# Patient Record
Sex: Male | Born: 1935
Health system: Southern US, Community
[De-identification: ages and names within clinical notes are randomized; demographics above are authoritative.]

## PROBLEM LIST (undated history)

## (undated) DIAGNOSIS — R972 Elevated prostate specific antigen [PSA]: Secondary | ICD-10-CM

## (undated) DIAGNOSIS — S2231XA Fracture of one rib, right side, initial encounter for closed fracture: Secondary | ICD-10-CM

## (undated) DIAGNOSIS — I471 Supraventricular tachycardia, unspecified: Secondary | ICD-10-CM

## (undated) DIAGNOSIS — E119 Type 2 diabetes mellitus without complications: Secondary | ICD-10-CM

## (undated) DIAGNOSIS — K635 Polyp of colon: Secondary | ICD-10-CM

## (undated) DIAGNOSIS — M1990 Unspecified osteoarthritis, unspecified site: Secondary | ICD-10-CM

## (undated) DIAGNOSIS — R6889 Other general symptoms and signs: Secondary | ICD-10-CM

## (undated) DIAGNOSIS — I10 Essential (primary) hypertension: Secondary | ICD-10-CM

## (undated) DIAGNOSIS — D649 Anemia, unspecified: Secondary | ICD-10-CM

## (undated) DIAGNOSIS — R002 Palpitations: Secondary | ICD-10-CM

## (undated) DIAGNOSIS — R4702 Dysphasia: Secondary | ICD-10-CM

## (undated) DIAGNOSIS — E039 Hypothyroidism, unspecified: Secondary | ICD-10-CM

## (undated) DIAGNOSIS — H9190 Unspecified hearing loss, unspecified ear: Secondary | ICD-10-CM

## (undated) DIAGNOSIS — R55 Syncope and collapse: Secondary | ICD-10-CM

## (undated) DIAGNOSIS — C159 Malignant neoplasm of esophagus, unspecified: Secondary | ICD-10-CM

## (undated) DIAGNOSIS — R001 Bradycardia, unspecified: Secondary | ICD-10-CM

## (undated) DIAGNOSIS — Z972 Presence of dental prosthetic device (complete) (partial): Secondary | ICD-10-CM

## (undated) DIAGNOSIS — E785 Hyperlipidemia, unspecified: Secondary | ICD-10-CM

## (undated) HISTORY — DX: Hypothyroidism, unspecified: E03.9

## (undated) HISTORY — DX: Hyperlipidemia, unspecified: E78.5

## (undated) HISTORY — DX: Malignant neoplasm of esophagus, unspecified: C15.9

## (undated) HISTORY — DX: Palpitations: R00.2

## (undated) HISTORY — DX: Supraventricular tachycardia, unspecified: I47.10

## (undated) HISTORY — DX: Supraventricular tachycardia: I47.1

## (undated) HISTORY — DX: Polyp of colon: K63.5

## (undated) HISTORY — DX: Dysphasia: R47.02

## (undated) HISTORY — PX: BACK SURGERY: SHX140

## (undated) HISTORY — PX: EYE SURGERY: SHX253

## (undated) HISTORY — DX: Syncope and collapse: R55

## (undated) HISTORY — DX: Essential (primary) hypertension: I10

## (undated) HISTORY — DX: Fracture of one rib, right side, initial encounter for closed fracture: S22.31XA

## (undated) HISTORY — DX: Type 2 diabetes mellitus without complications: E11.9

## (undated) HISTORY — DX: Unspecified osteoarthritis, unspecified site: M19.90

## (undated) HISTORY — DX: Bradycardia, unspecified: R00.1

## (undated) HISTORY — DX: Other general symptoms and signs: R68.89

## (undated) HISTORY — PX: CYST EXCISION: SHX5701

## (undated) HISTORY — DX: Elevated prostate specific antigen (PSA): R97.20

---

## 1965-09-24 HISTORY — PX: CHOLECYSTECTOMY: SHX55

## 1973-09-24 HISTORY — PX: VASECTOMY: SHX75

## 1973-09-24 HISTORY — PX: VARICOSE VEIN SURGERY: SHX832

## 1979-05-26 DIAGNOSIS — E039 Hypothyroidism, unspecified: Secondary | ICD-10-CM

## 1979-05-26 HISTORY — DX: Hypothyroidism, unspecified: E03.9

## 1984-09-24 DIAGNOSIS — E119 Type 2 diabetes mellitus without complications: Secondary | ICD-10-CM

## 1984-09-24 HISTORY — DX: Type 2 diabetes mellitus without complications: E11.9

## 1997-02-09 ENCOUNTER — Encounter: Payer: Self-pay | Admitting: Family Medicine

## 1997-02-09 LAB — CONVERTED CEMR LAB: PSA: 1.5 ng/mL

## 1998-02-07 ENCOUNTER — Encounter: Payer: Self-pay | Admitting: Family Medicine

## 1998-08-22 ENCOUNTER — Encounter: Payer: Self-pay | Admitting: Family Medicine

## 1999-02-25 ENCOUNTER — Encounter: Payer: Self-pay | Admitting: Family Medicine

## 1999-02-25 LAB — CONVERTED CEMR LAB: Microalbumin U total vol: 24.3 mg/L

## 1999-02-28 ENCOUNTER — Encounter: Payer: Self-pay | Admitting: Family Medicine

## 1999-02-28 LAB — CONVERTED CEMR LAB: Hgb A1c MFr Bld: 7.3 %

## 2000-04-24 ENCOUNTER — Encounter: Payer: Self-pay | Admitting: Family Medicine

## 2000-04-24 LAB — CONVERTED CEMR LAB: Microalbumin U total vol: 12.9 mg/L

## 2000-05-09 ENCOUNTER — Encounter: Payer: Self-pay | Admitting: Family Medicine

## 2000-05-09 LAB — CONVERTED CEMR LAB: Hgb A1c MFr Bld: 6.7 %

## 2000-10-25 DIAGNOSIS — I10 Essential (primary) hypertension: Secondary | ICD-10-CM

## 2000-10-25 HISTORY — DX: Essential (primary) hypertension: I10

## 2000-10-25 HISTORY — PX: OTHER SURGICAL HISTORY: SHX169

## 2001-05-14 ENCOUNTER — Encounter: Payer: Self-pay | Admitting: Family Medicine

## 2001-05-14 LAB — CONVERTED CEMR LAB: PSA: 1.2 ng/mL

## 2001-10-29 ENCOUNTER — Encounter: Payer: Self-pay | Admitting: Family Medicine

## 2002-04-08 ENCOUNTER — Encounter: Payer: Self-pay | Admitting: Family Medicine

## 2002-07-20 ENCOUNTER — Encounter: Payer: Self-pay | Admitting: Family Medicine

## 2002-07-20 LAB — CONVERTED CEMR LAB
Hgb A1c MFr Bld: 6.7 %
Microalbumin U total vol: 42 mg/L

## 2003-07-08 ENCOUNTER — Encounter: Payer: Self-pay | Admitting: Family Medicine

## 2003-07-08 LAB — CONVERTED CEMR LAB: PSA: 1.7 ng/mL

## 2003-10-13 ENCOUNTER — Encounter: Payer: Self-pay | Admitting: Family Medicine

## 2004-01-12 ENCOUNTER — Encounter: Payer: Self-pay | Admitting: Family Medicine

## 2004-01-12 LAB — CONVERTED CEMR LAB: Hgb A1c MFr Bld: 6.6 %

## 2004-06-13 ENCOUNTER — Encounter: Admission: RE | Admit: 2004-06-13 | Discharge: 2004-06-13 | Payer: Self-pay | Admitting: *Deleted

## 2004-06-14 ENCOUNTER — Ambulatory Visit (HOSPITAL_BASED_OUTPATIENT_CLINIC_OR_DEPARTMENT_OTHER): Admission: RE | Admit: 2004-06-14 | Discharge: 2004-06-14 | Payer: Self-pay | Admitting: *Deleted

## 2004-06-14 ENCOUNTER — Ambulatory Visit (HOSPITAL_COMMUNITY): Admission: RE | Admit: 2004-06-14 | Discharge: 2004-06-14 | Payer: Self-pay | Admitting: *Deleted

## 2004-06-14 ENCOUNTER — Encounter (INDEPENDENT_AMBULATORY_CARE_PROVIDER_SITE_OTHER): Payer: Self-pay | Admitting: Specialist

## 2004-06-14 HISTORY — PX: CYSTECTOMY: SUR359

## 2004-07-11 ENCOUNTER — Encounter: Payer: Self-pay | Admitting: Family Medicine

## 2004-07-11 LAB — CONVERTED CEMR LAB
Microalbumin U total vol: 39.1 mg/L
PSA: 2.8 ng/mL

## 2004-08-01 ENCOUNTER — Ambulatory Visit: Payer: Self-pay | Admitting: Family Medicine

## 2004-10-02 ENCOUNTER — Ambulatory Visit: Payer: Self-pay | Admitting: Family Medicine

## 2004-12-26 ENCOUNTER — Ambulatory Visit: Payer: Self-pay | Admitting: Family Medicine

## 2004-12-26 LAB — CONVERTED CEMR LAB
Hgb A1c MFr Bld: 6.2 %
Microalbumin U total vol: 37.5 mg/L

## 2004-12-28 ENCOUNTER — Ambulatory Visit: Payer: Self-pay | Admitting: Family Medicine

## 2005-06-26 ENCOUNTER — Ambulatory Visit: Payer: Self-pay | Admitting: Family Medicine

## 2005-06-28 ENCOUNTER — Ambulatory Visit: Payer: Self-pay | Admitting: Family Medicine

## 2005-07-18 ENCOUNTER — Ambulatory Visit: Payer: Self-pay | Admitting: Family Medicine

## 2005-08-01 ENCOUNTER — Ambulatory Visit: Payer: Self-pay | Admitting: Family Medicine

## 2005-10-02 ENCOUNTER — Ambulatory Visit: Payer: Self-pay | Admitting: Family Medicine

## 2005-10-31 ENCOUNTER — Ambulatory Visit: Payer: Self-pay | Admitting: Family Medicine

## 2005-11-02 ENCOUNTER — Ambulatory Visit: Payer: Self-pay | Admitting: Family Medicine

## 2006-06-25 ENCOUNTER — Ambulatory Visit: Payer: Self-pay | Admitting: Internal Medicine

## 2006-08-19 ENCOUNTER — Ambulatory Visit: Payer: Self-pay | Admitting: Family Medicine

## 2006-08-19 LAB — CONVERTED CEMR LAB: Microalbumin U total vol: 52.4 mg/L

## 2006-08-23 ENCOUNTER — Ambulatory Visit: Payer: Self-pay | Admitting: Family Medicine

## 2006-09-06 ENCOUNTER — Ambulatory Visit: Payer: Self-pay | Admitting: Family Medicine

## 2007-01-06 ENCOUNTER — Encounter: Payer: Self-pay | Admitting: Family Medicine

## 2007-01-07 DIAGNOSIS — E7800 Pure hypercholesterolemia, unspecified: Secondary | ICD-10-CM | POA: Insufficient documentation

## 2007-01-07 DIAGNOSIS — M129 Arthropathy, unspecified: Secondary | ICD-10-CM | POA: Insufficient documentation

## 2007-01-07 DIAGNOSIS — E039 Hypothyroidism, unspecified: Secondary | ICD-10-CM | POA: Insufficient documentation

## 2007-01-07 DIAGNOSIS — E119 Type 2 diabetes mellitus without complications: Secondary | ICD-10-CM | POA: Insufficient documentation

## 2007-01-07 DIAGNOSIS — I10 Essential (primary) hypertension: Secondary | ICD-10-CM | POA: Insufficient documentation

## 2007-01-23 ENCOUNTER — Ambulatory Visit: Payer: Self-pay | Admitting: Family Medicine

## 2007-06-26 ENCOUNTER — Ambulatory Visit: Payer: Self-pay | Admitting: Family Medicine

## 2007-08-27 ENCOUNTER — Ambulatory Visit: Payer: Self-pay | Admitting: Family Medicine

## 2007-08-27 LAB — CONVERTED CEMR LAB
BUN: 17 mg/dL (ref 6–23)
Basophils Absolute: 0 10*3/uL (ref 0.0–0.1)
Calcium: 9.9 mg/dL (ref 8.4–10.5)
Chloride: 103 meq/L (ref 96–112)
Cholesterol: 139 mg/dL (ref 0–200)
Creatinine, Ser: 1.2 mg/dL (ref 0.4–1.5)
Eosinophils Absolute: 0.1 10*3/uL (ref 0.0–0.6)
Glucose, Bld: 151 mg/dL — ABNORMAL HIGH (ref 70–99)
HCT: 40.1 % (ref 39.0–52.0)
HDL: 39.6 mg/dL (ref >39.0)
Lymphocytes Relative: 16.5 % (ref 12.0–46.0)
Neutro Abs: 4.6 10*3/uL (ref 1.4–7.7)
Potassium: 4.7 meq/L (ref 3.5–5.1)
RBC: 4.33 M/uL (ref 4.22–5.81)
RDW: 12.4 % (ref 11.5–14.6)
Sodium: 139 meq/L (ref 135–145)
TSH: 0.52 microintl units/mL (ref 0.35–5.50)
VLDL: 15 mg/dL (ref 0–40)
WBC: 6.2 10*3/uL (ref 4.5–10.5)

## 2007-09-01 ENCOUNTER — Ambulatory Visit: Payer: Self-pay | Admitting: Family Medicine

## 2007-09-01 DIAGNOSIS — M19049 Primary osteoarthritis, unspecified hand: Secondary | ICD-10-CM | POA: Insufficient documentation

## 2007-09-02 ENCOUNTER — Encounter: Payer: Self-pay | Admitting: Family Medicine

## 2007-09-09 ENCOUNTER — Telehealth (INDEPENDENT_AMBULATORY_CARE_PROVIDER_SITE_OTHER): Payer: Self-pay | Admitting: *Deleted

## 2007-09-12 ENCOUNTER — Ambulatory Visit: Payer: Self-pay | Admitting: Family Medicine

## 2007-09-15 ENCOUNTER — Encounter (INDEPENDENT_AMBULATORY_CARE_PROVIDER_SITE_OTHER): Payer: Self-pay | Admitting: *Deleted

## 2007-09-24 ENCOUNTER — Telehealth (INDEPENDENT_AMBULATORY_CARE_PROVIDER_SITE_OTHER): Payer: Self-pay | Admitting: *Deleted

## 2007-10-01 ENCOUNTER — Ambulatory Visit: Payer: Self-pay | Admitting: Family Medicine

## 2007-10-01 LAB — CONVERTED CEMR LAB
ALT: 38 units/L (ref 0–53)
AST: 29 units/L (ref 0–37)

## 2007-11-27 ENCOUNTER — Ambulatory Visit: Payer: Self-pay | Admitting: Family Medicine

## 2007-11-27 LAB — CONVERTED CEMR LAB
ALT: 33 units/L (ref 0–53)
AST: 33 units/L (ref 0–37)
HDL: 42.3 mg/dL (ref >39.0)
VLDL: 11 mg/dL (ref 0–40)

## 2007-12-08 ENCOUNTER — Ambulatory Visit: Payer: Self-pay | Admitting: Family Medicine

## 2007-12-09 ENCOUNTER — Telehealth: Payer: Self-pay | Admitting: Family Medicine

## 2007-12-19 ENCOUNTER — Encounter: Payer: Self-pay | Admitting: Family Medicine

## 2008-02-18 ENCOUNTER — Ambulatory Visit: Payer: Self-pay | Admitting: Family Medicine

## 2008-02-18 LAB — CONVERTED CEMR LAB: Hgb A1c MFr Bld: 6.7 % — ABNORMAL HIGH (ref 4.6–6.0)

## 2008-02-24 ENCOUNTER — Ambulatory Visit: Payer: Self-pay | Admitting: Family Medicine

## 2008-06-21 ENCOUNTER — Ambulatory Visit: Payer: Self-pay | Admitting: Family Medicine

## 2008-08-05 ENCOUNTER — Telehealth: Payer: Self-pay | Admitting: Family Medicine

## 2008-09-01 ENCOUNTER — Ambulatory Visit: Payer: Self-pay | Admitting: Family Medicine

## 2008-09-01 LAB — CONVERTED CEMR LAB
AST: 31 units/L (ref 0–37)
Alkaline Phosphatase: 42 units/L (ref 39–117)
BUN: 25 mg/dL — ABNORMAL HIGH (ref 6–23)
Basophils Relative: 0.1 % (ref 0.0–3.0)
Bilirubin, Direct: 0.2 mg/dL (ref 0.0–0.3)
CO2: 28 meq/L (ref 19–32)
Cholesterol: 121 mg/dL (ref 0–200)
Creatinine,U: 80.3 mg/dL
Eosinophils Absolute: 0.1 10*3/uL (ref 0.0–0.7)
Free T4: 1.3 ng/dL (ref 0.6–1.6)
GFR calc Af Amer: 76 mL/min
HCT: 41.9 % (ref 39.0–52.0)
HDL: 40.7 mg/dL (ref >39.0)
Hemoglobin: 14.2 g/dL (ref 13.0–17.0)
Hgb A1c MFr Bld: 6.8 % — ABNORMAL HIGH (ref 4.6–6.0)
LDL Cholesterol: 67 mg/dL (ref 0–99)
Microalb Creat Ratio: 74.7 mg/g — ABNORMAL HIGH (ref 0.0–30.0)
Microalb, Ur: 6 mg/dL — ABNORMAL HIGH (ref 0.0–1.9)
Monocytes Absolute: 0.5 10*3/uL (ref 0.1–1.0)
Monocytes Relative: 8 % (ref 3.0–12.0)
RBC: 4.59 M/uL (ref 4.22–5.81)
Total Protein: 7.1 g/dL (ref 6.0–8.3)
Triglycerides: 65 mg/dL (ref 0–149)
WBC: 6.7 10*3/uL (ref 4.5–10.5)

## 2008-09-09 ENCOUNTER — Ambulatory Visit: Payer: Self-pay | Admitting: Family Medicine

## 2008-09-20 ENCOUNTER — Telehealth: Payer: Self-pay | Admitting: Family Medicine

## 2008-10-15 ENCOUNTER — Ambulatory Visit: Payer: Self-pay | Admitting: Family Medicine

## 2008-10-15 ENCOUNTER — Encounter (INDEPENDENT_AMBULATORY_CARE_PROVIDER_SITE_OTHER): Payer: Self-pay | Admitting: *Deleted

## 2008-10-15 LAB — CONVERTED CEMR LAB
OCCULT 1: NEGATIVE
OCCULT 2: NEGATIVE
OCCULT 3: NEGATIVE

## 2008-11-01 ENCOUNTER — Encounter: Payer: Self-pay | Admitting: Family Medicine

## 2008-11-01 HISTORY — PX: PROSTATE BIOPSY: SHX241

## 2008-11-02 ENCOUNTER — Encounter: Payer: Self-pay | Admitting: Family Medicine

## 2008-11-10 ENCOUNTER — Encounter: Payer: Self-pay | Admitting: Family Medicine

## 2009-05-05 ENCOUNTER — Telehealth: Payer: Self-pay | Admitting: Family Medicine

## 2009-05-10 ENCOUNTER — Encounter: Payer: Self-pay | Admitting: Family Medicine

## 2009-05-11 ENCOUNTER — Encounter: Payer: Self-pay | Admitting: Family Medicine

## 2009-05-16 ENCOUNTER — Ambulatory Visit: Payer: Self-pay | Admitting: Family Medicine

## 2009-06-23 ENCOUNTER — Ambulatory Visit: Payer: Self-pay | Admitting: Family Medicine

## 2009-09-09 ENCOUNTER — Ambulatory Visit: Payer: Self-pay | Admitting: Family Medicine

## 2009-09-11 LAB — CONVERTED CEMR LAB
ALT: 36 units/L (ref 0–53)
Albumin: 3.9 g/dL (ref 3.5–5.2)
BUN: 22 mg/dL (ref 6–23)
Basophils Absolute: 0 10*3/uL (ref 0.0–0.1)
Bilirubin, Direct: 0.2 mg/dL (ref 0.0–0.3)
Calcium: 9.4 mg/dL (ref 8.4–10.5)
Chloride: 100 meq/L (ref 96–112)
Creatinine, Ser: 1.2 mg/dL (ref 0.4–1.5)
Creatinine,U: 127.3 mg/dL
Eosinophils Absolute: 0.1 10*3/uL (ref 0.0–0.7)
Eosinophils Relative: 1.2 % (ref 0.0–5.0)
Free T4: 1.3 ng/dL (ref 0.6–1.6)
Hemoglobin: 13.8 g/dL (ref 13.0–17.0)
LDL Cholesterol: 50 mg/dL (ref 0–99)
Lymphocytes Relative: 14.1 % (ref 12.0–46.0)
Lymphs Abs: 0.9 10*3/uL (ref 0.7–4.0)
Microalb Creat Ratio: 88 mg/g — ABNORMAL HIGH (ref 0.0–30.0)
Monocytes Relative: 5.7 % (ref 3.0–12.0)
Neutro Abs: 4.9 10*3/uL (ref 1.4–7.7)
Sodium: 137 meq/L (ref 135–145)
Total Bilirubin: 1.3 mg/dL — ABNORMAL HIGH (ref 0.3–1.2)
Total Protein: 7.1 g/dL (ref 6.0–8.3)
Vit D, 25-Hydroxy: 100 ng/mL — ABNORMAL HIGH (ref 30–89)

## 2009-09-13 ENCOUNTER — Ambulatory Visit: Payer: Self-pay | Admitting: Family Medicine

## 2009-09-24 DIAGNOSIS — R972 Elevated prostate specific antigen [PSA]: Secondary | ICD-10-CM

## 2009-09-24 HISTORY — DX: Elevated prostate specific antigen (PSA): R97.20

## 2009-10-05 ENCOUNTER — Ambulatory Visit: Payer: Self-pay | Admitting: Family Medicine

## 2009-11-15 ENCOUNTER — Encounter: Payer: Self-pay | Admitting: Family Medicine

## 2010-03-09 ENCOUNTER — Encounter (INDEPENDENT_AMBULATORY_CARE_PROVIDER_SITE_OTHER): Payer: Self-pay | Admitting: *Deleted

## 2010-03-10 ENCOUNTER — Ambulatory Visit: Payer: Self-pay | Admitting: Family Medicine

## 2010-03-13 ENCOUNTER — Ambulatory Visit: Payer: Self-pay | Admitting: Family Medicine

## 2010-03-13 ENCOUNTER — Encounter (INDEPENDENT_AMBULATORY_CARE_PROVIDER_SITE_OTHER): Payer: Self-pay | Admitting: *Deleted

## 2010-04-25 ENCOUNTER — Encounter: Payer: Self-pay | Admitting: Family Medicine

## 2010-04-27 ENCOUNTER — Telehealth: Payer: Self-pay | Admitting: Family Medicine

## 2010-05-01 ENCOUNTER — Encounter (INDEPENDENT_AMBULATORY_CARE_PROVIDER_SITE_OTHER): Payer: Self-pay | Admitting: *Deleted

## 2010-05-05 LAB — HM DIABETES EYE EXAM: HM Diabetic Eye Exam: NORMAL

## 2010-06-13 ENCOUNTER — Ambulatory Visit: Payer: Self-pay | Admitting: Family Medicine

## 2010-09-24 HISTORY — PX: COLONOSCOPY W/ POLYPECTOMY: SHX1380

## 2010-10-25 NOTE — Assessment & Plan Note (Signed)
Summary: F/U AFTER LABS / LFW   Vital Signs:  Patient profile:   77 year old male Weight:      165 pounds Temp:     98.1 degrees F oral Pulse rate:   68 / minute Pulse rhythm:   regular BP sitting:   124 / 70  (left arm) Cuff size:   regular  Vitals Entered By: Sydell Axon LPN (March 13, 2010 11:57 AM) CC: 6 Month follow-up after labs   History of Present Illness: Pt here with signif other for followup. His diary nos are great...an occas 150 at night but generally 80-130s. His sleep has improved some. He has used Melatonin with good results and knows not to take prolonged periods w/o holiday. He takes Vit D in MVI and not everday 1000Iu tab.  Problems Prior to Update: 1)  Psa, Increased  (ICD-790.93) 2)  Special Screening Malig Neoplasms Other Sites  (ICD-V76.49) 3)  Osteoarthritis, Hands, Bilateral (DR Gavin Potters)  (ICD-715.94) 4)  Arthritis, Hands, Bilateral  (ZOX-096.04) 5)  Special Screening Malignant Neoplasm of Prostate  (ICD-V76.44) 6)  Hypercholesterolemia/ 156/ldl 101 7/03  (ICD-272.0) 7)  Thrombocytopenia, 6/00  (ICD-287.5) 8)  Arthritis, Elbows and Hips  (ICD-716.90) 9)  Hypothyroidism  (ICD-244.9) 10)  Hypertension  (ICD-401.9) 11)  Diabetes Mellitus, Type II  (ICD-250.00)  Medications Prior to Update: 1)  Levothroid 75 Mcg  Tabs (Levothyroxine Sodium) .... One Tab By Mouth Per Day 2)  Benazepril-Hydrochlorothiazide 20-12.5 Mg Tabs (Benazepril-Hydrochlorothiazide) .Marland Kitchen.. 1 Daily By Mouth 3)  Glyburide-Metformin 2.5-500 Mg Tabs (Glyburide-Metformin) .Marland Kitchen.. 1 Tablet Am and 2 Tablets At Supper 4)  Benazepril Hcl 10 Mg  Tabs (Benazepril Hcl) .Marland Kitchen.. 1 Once Daily Daily By Mouth 5)  Hydrocodone-Acetaminophen 5-500 Mg  Tabs (Hydrocodone-Acetaminophen) .Marland Kitchen.. 1 Q 4 To 6 Hours Prn 6)  Pravachol 40 Mg  Tabs (Pravastatin Sodium) .... 2 Tabs By Mouth At Night 7)  Bd Single Use Swabs Regular  Pads (Alcohol Swabs) .Marland Kitchen.. 100 8)  Onetouch Test  Strp (Glucose Blood) .... Check Sugar  Daily and If Needed 250.00 9)  Voltaren 1 % Gel (Diclofenac Sodium) .... Apply 2 Grams To Area Four Times A Day As Needed. 10)  Bd Swab Single Use Regular  Pads (Alcohol Swabs) .... Use As Directed To Test Blood Sugar Once Daily 11)  Clotrimazole-Betamethasone 1-0.05 % Crea (Clotrimazole-Betamethasone) .... Apply To Area Two Times A Day  Allergies: 1)  ! Sulfa 2)  ! Sb Ibuprofen (Ibuprofen) 3)  ! Crestor (Rosuvastatin Calcium)  Physical Exam  General:  Well-developed,well-nourished,in no acute distress; alert,appropriate and cooperative throughout examination Head:  Normocephalic and atraumatic without obvious abnormalities. No apparent alopecia or balding. Eyes:  Conjunctiva clear bilaterally.  Ears:  External ear exam shows no significant lesions or deformities.  Otoscopic examination reveals clear canals, tympanic membranes are intact bilaterally without bulging, retraction, inflammation or discharge. Hearing is grossly normal bilaterally. Nose:  External nasal examination shows no deformity or inflammation. Nasal mucosa are pink and moist without lesions or exudates. Mouth:  Oral mucosa and oropharynx without lesions or exudates.  Teeth in good repair. Neck:  No deformities, masses, or tenderness noted. Lungs:  Normal respiratory effort, chest expands symmetrically. Lungs are clear to auscultation, no crackles or wheezes. Heart:  Normal rate and regular rhythm. S1 and S2 normal without gallop, murmur, click, rub or other extra sounds.   Impression & Recommendations:  Problem # 1:  HYPERVITAMINOSIS D (ICD-278.4) Assessment Unchanged will recheck today and report via phone tree. Orders: Venipuncture (  [phone]) T-Vitamin D (25-Hydroxy) [phone])  Problem # 2:  SPECIAL SCREENING MALIGNANT NEOPLASM OF PROSTATE (ICD-V76.44) Assessment: Unchanged Being seen by Dr Wanda Plump, now Dr Achilles Dunk.  Problem # 3:  HYPERTENSION (ICD-401.9) Assessment: Unchanged Stable. The following  medications were removed from the medication list:    Benazepril Hcl 10 Mg Tabs (Benazepril hcl) .Marland Kitchen... 1 once daily daily by mouth His updated medication list for this problem includes:    Benazepril-hydrochlorothiazide 20-12.5 Mg Tabs (Benazepril-hydrochlorothiazide) .Marland Kitchen... 1 daily by mouth  BP today: 124/70 Prior BP: 128/70 (09/13/2009)  Labs Reviewed: K+: 4.2 (09/09/2009) Creat: : 1.2 (09/09/2009)   Chol: 106 (09/09/2009)   HDL: 41.60 (09/09/2009)   LDL: 50 (09/09/2009)   TG: 71.0 (09/09/2009)  Problem # 4:  DIABETES MELLITUS, TYPE II (ICD-250.00) Assessment: Unchanged  Great control, cont. The following medications were removed from the medication list:    Benazepril Hcl 10 Mg Tabs (Benazepril hcl) .Marland Kitchen... 1 once daily daily by mouth His updated medication list for this problem includes:    Benazepril-hydrochlorothiazide 20-12.5 Mg Tabs (Benazepril-hydrochlorothiazide) .Marland Kitchen... 1 daily by mouth    Glyburide-metformin 2.5-500 Mg Tabs (Glyburide-metformin) .Marland Kitchen... 1 tablet am and 2 tablets at supper  Labs Reviewed: Creat: 1.2 (09/09/2009)   Microalbumin: 52.4 (08/19/2006)  Last Eye Exam: normal (05/11/2009) Reviewed HgBA1c results: 6.6 (03/10/2010)  6.9 (09/09/2009)  Complete Medication List: 1)  Levothroid 75 Mcg Tabs (Levothyroxine sodium) .... One tab by mouth per day 2)  Benazepril-hydrochlorothiazide 20-12.5 Mg Tabs (Benazepril-hydrochlorothiazide) .Marland Kitchen.. 1 daily by mouth 3)  Glyburide-metformin 2.5-500 Mg Tabs (Glyburide-metformin) .Marland Kitchen.. 1 tablet am and 2 tablets at supper 4)  Hydrocodone-acetaminophen 5-500 Mg Tabs (Hydrocodone-acetaminophen) .Marland Kitchen.. 1 every 4 to 6 hours as needed 5)  Pravachol 40 Mg Tabs (Pravastatin sodium) .... 2 tabs by mouth at night 6)  Bd Single Use Swabs Regular Pads (Alcohol swabs) .Marland Kitchen.. 100 7)  Onetouch Test Strp (Glucose blood) .... Check sugar daily and if needed 250.00 8)  Voltaren 1 % Gel (Diclofenac sodium) .... Apply 2 grams to area four times a day as  needed. 9)  Bd Swab Single Use Regular Pads (Alcohol swabs) .... Use as directed to test blood sugar once daily 10)  Clotrimazole-betamethasone 1-0.05 % Crea (Clotrimazole-betamethasone) .... Apply to area two times a day  Patient Instructions: 1)  RTC 6 mos for Comp Exam, labs prior.  Current Allergies (reviewed today): ! SULFA ! SB IBUPROFEN (IBUPROFEN) ! CRESTOR (ROSUVASTATIN CALCIUM)  Appended Document: F/U AFTER LABS / LFW

## 2010-10-25 NOTE — Letter (Signed)
Summary: Dr.[place] Humphries,[facility],Note  Dr.[place] Humphries,[facility],Note   Imported By: Beau Fanny 11/16/2009 12:46:27  _____________________________________________________________________  External Attachment:    Type:   Image     Comment:   External Document

## 2010-10-25 NOTE — Progress Notes (Signed)
Summary: refill request for voltaren gel  Phone Note Refill Request Message from:  Fax from Pharmacy  Refills Requested: Medication #1:  VOLTAREN 1 % GEL apply 2 grams to area four times a day as needed.   Last Refilled: 09/06/2009 Faxed request from [facility], [phone].  Initial call taken by: Lowella Petties CMA,  April 27, 2010 9:42 AM  Follow-up for Phone Call        please call in.  I couldn't send electronically  Follow-up by: Crawford Givens MD,  April 27, 2010 10:36 AM  Additional Follow-up for Phone Call Additional follow up Details #1::        Medication phoned to pharmacy.  Additional Follow-up by: Delilah Shan CMA (AAMA),  April 27, 2010 10:49 AM    Prescriptions: VOLTAREN 1 % GEL (DICLOFENAC SODIUM) apply 2 grams to area four times a day as needed.  #gel 100gm x 12   Entered and Authorized by:   Crawford Givens MD   Signed by:   Crawford Givens MD on 04/27/2010   Method used:   Telephoned to ...       [facility] [address] [address] (retail)       [address]       [place], [place]  [address]       Ph: [phone]       Fax: [phone]   RxID:   [id number]

## 2010-10-25 NOTE — Letter (Signed)
Summary: Nadara Eaton letter  [place] at [facility]  [address] [place], [place] [address]   Phone: [phone]  Fax: [phone]       05/01/2010 MRN: [id number]  Leven Lewellyn [address] [place], [place]  [address]  Dear Mr. [facility] [place], and [facility] announce the retirement of Arta Silence, M.D., from full-time practice at the [facility] office effective March 23, 2010 and his plans of returning part-time.  It is important to Dr. Hetty Ely and to our practice that you understand that [facility] has seven physicians in our office for your health care needs.  We will continue to offer the same exceptional care that you have today.    Dr. Hetty Ely has spoken to many of you about his plans for retirement and returning part-time in the fall.   We will continue to work with you through the transition to schedule appointments for you in the office and meet the high standards that [place] is committed to.   Again, it is with great pleasure that we share the news that Dr. Hetty Ely will return to [facility] in October of 2011 with a reduced schedule.    If you have any questions, or would like to request an appointment with one of our physicians, please call us at [phone] and press the option for Scheduling an appointment.  We take pleasure in providing you with excellent patient care and look forward to seeing you at your next office visit.  Our [facility] Physicians are:  Tillman Abide, M.D. Laurita Quint, M.D. Roxy Manns, M.D. Kerby Nora, M.D. Hannah Beat, M.D. Ruthe Mannan, M.D. We proudly welcomed Raechel Ache, M.D. and Eustaquio Boyden, M.D. to the practice in July/August 2011.  Sincerely,  [place] [facility]

## 2010-10-25 NOTE — Letter (Signed)
Summary: [facility]  [facility]   Imported By: Maryln Gottron 05/05/2010 14:44:19  _____________________________________________________________________  External Attachment:    Type:   Image     Comment:   External Document  Appended Document: [facility]    Clinical Lists Changes  Observations: Added new observation of PAST MED HX: Diabetes mellitus, type II,  1986 Hypertension,   02/02 Hypothyroidism, 1980's elevated PSA per Dr. Achilles Dunk with Uro 2011  (05/06/2010 19:30)       Past History:  Past Medical History: Diabetes mellitus, type II,  1986 Hypertension,   02/02 Hypothyroidism, 1980's elevated PSA per Dr. Achilles Dunk with [facility] 2011

## 2010-10-25 NOTE — Letter (Signed)
Summary: Results Follow up Letter  [place] at [facility]  [address] [place], [place] [address]   Phone: [phone]  Fax: [phone]    03/09/2010 MRN: [id number]    Aaron Becker [address] [place], [place]  [address]    Dear Mr. PARODI,  The following are the results of your recent test(s):  Test         Result    Pap Smear:        Normal _____  Not Normal _____ Comments: ______________________________________________________ Cholesterol: LDL(Bad cholesterol):         Your goal is less than:         HDL (Good cholesterol):       Your goal is more than: Comments:  ______________________________________________________ Mammogram:        Normal _____  Not Normal _____ Comments:  ___________________________________________________________________ Hemoccult:        Normal __X___  Not normal _______ Comments:    Yearly follow up is recommended.   _____________________________________________________________________ Other Tests:    We routinely do not discuss normal results over the telephone.  If you desire a copy of the results, or you have any questions about this information we can discuss them at your next office visit.   Sincerely,   Arta Silence, M.D.  RNS:lsf

## 2010-10-25 NOTE — Assessment & Plan Note (Signed)
Summary: FLU SHOT/CLE  Nurse Visit   Allergies: 1)  ! Sulfa 2)  ! Sb Ibuprofen (Ibuprofen) 3)  ! Crestor (Rosuvastatin Calcium)  Immunizations Administered:  Influenza Vaccine # 1:    Vaccine Type: Fluvax 3+    Site: left deltoid    Mfr: [employer]    Dose: 0.5 ml    Route: IM    Given by: Mervin Hack CMA (AAMA)    Exp. Date: 03/24/2011    Lot #: [id number]    VIS given: 04/18/10 version given June 13, 2010.  Flu Vaccine Consent Questions:    Do you have a history of severe allergic reactions to this vaccine? no    Any prior history of allergic reactions to egg and/or gelatin? no    Do you have a sensitivity to the preservative Thimersol? no    Do you have a past history of Guillan-Barre Syndrome? no    Do you currently have an acute febrile illness? no    Have you ever had a severe reaction to latex? no    Vaccine information given and explained to patient? yes  Orders Added: 1)  Flu Vaccine 73yrs + [90658] 2)  Admin 1st Vaccine [42706]

## 2010-11-01 ENCOUNTER — Encounter: Payer: Self-pay | Admitting: Family Medicine

## 2010-11-01 ENCOUNTER — Ambulatory Visit (INDEPENDENT_AMBULATORY_CARE_PROVIDER_SITE_OTHER): Payer: Medicare Other | Admitting: Family Medicine

## 2010-11-01 DIAGNOSIS — IMO0002 Reserved for concepts with insufficient information to code with codable children: Secondary | ICD-10-CM

## 2010-11-01 DIAGNOSIS — M19049 Primary osteoarthritis, unspecified hand: Secondary | ICD-10-CM

## 2010-11-08 ENCOUNTER — Encounter: Payer: Self-pay | Admitting: Family Medicine

## 2010-11-09 NOTE — Assessment & Plan Note (Signed)
Summary: INJURED SHOULDER, COMING AT 12:00 PER DR Billie Trager   Vital Signs:  Patient profile:   77 year old male Weight:      163.75 pounds BMI:     24.99 Temp:     97.9 degrees F oral Pulse rate:   64 / minute Pulse rhythm:   regular BP sitting:   122 / 68  (left arm) Cuff size:   regular  Vitals Entered By: Sydell Axon LPN (November 01, 2010 11:39 AM) CC: Injured right shoulder on Monday   History of Present Illness: Pt her for right shoulder pain. He had a truck tire in the back of his pickup truck and pulled his shoulder acutely and heard a pop as he was lifting the tire out from the bed. He has had right shoulder problems with pulled muscle years ago. He had been seen at [facility] with injection, therapy and heat treatments. No echymosis. No swelling. Things had pretty much resolved with no percieved compromise of function until this. This happened a few days ago. He has been applying topical heat packs and feels sxs may be slightly better this AM.  Problems Prior to Update: 1)  Hypervitaminosis D  (ICD-278.4) 2)  Psa, Increased  (ICD-790.93) 3)  Special Screening Malig Neoplasms Other Sites  (ICD-V76.49) 4)  Osteoarthritis, Hands, Bilateral (DR KERNODLE)  (ICD-715.94) 5)  Arthritis, Hands, Bilateral  (ZOX-096.04) 6)  Special Screening Malignant Neoplasm of Prostate  (ICD-V76.44) 7)  Hypercholesterolemia/ 156/ldl 101 7/03  (ICD-272.0) 8)  Thrombocytopenia, 6/00  (ICD-287.5) 9)  Arthritis, Elbows and Hips  (ICD-716.90) 10)  Hypothyroidism  (ICD-244.9) 11)  Hypertension  (ICD-401.9) 12)  Diabetes Mellitus, Type II  (ICD-250.00)  Medications Prior to Update: 1)  Levothroid 75 Mcg  Tabs (Levothyroxine Sodium) .... One Tab By Mouth Per Day 2)  Benazepril-Hydrochlorothiazide 20-12.5 Mg Tabs (Benazepril-Hydrochlorothiazide) .Marland Kitchen.. 1 Daily By Mouth 3)  Glyburide-Metformin 2.5-500 Mg Tabs (Glyburide-Metformin) .Marland Kitchen.. 1 Tablet Am and 2 Tablets At Supper 4)  Hydrocodone-Acetaminophen 5-500 Mg   Tabs (Hydrocodone-Acetaminophen) .Marland Kitchen.. 1 Every 4 To 6 Hours As Needed 5)  Pravachol 40 Mg  Tabs (Pravastatin Sodium) .... 2 Tabs By Mouth At Night 6)  Bd Single Use Swabs Regular  Pads (Alcohol Swabs) .Marland Kitchen.. 100 7)  Onetouch Test  Strp (Glucose Blood) .... Check Sugar Daily and If Needed 250.00 8)  Voltaren 1 % Gel (Diclofenac Sodium) .... Apply 2 Grams To Area Four Times A Day As Needed. 9)  Bd Swab Single Use Regular  Pads (Alcohol Swabs) .... Use As Directed To Test Blood Sugar Once Daily 10)  Clotrimazole-Betamethasone 1-0.05 % Crea (Clotrimazole-Betamethasone) .... Apply To Area Two Times A Day  Current Medications (verified): 1)  Levothroid 75 Mcg  Tabs (Levothyroxine Sodium) .... One Tab By Mouth Per Day 2)  Benazepril-Hydrochlorothiazide 20-12.5 Mg Tabs (Benazepril-Hydrochlorothiazide) .Marland Kitchen.. 1 Daily By Mouth 3)  Glyburide-Metformin 2.5-500 Mg Tabs (Glyburide-Metformin) .Marland Kitchen.. 1 Tablet Am and 2 Tablets At Supper 4)  Hydrocodone-Acetaminophen 5-500 Mg  Tabs (Hydrocodone-Acetaminophen) .Marland Kitchen.. 1 Every 4 To 6 Hours As Needed 5)  Pravachol 40 Mg  Tabs (Pravastatin Sodium) .... 2 Tabs By Mouth At Night 6)  Bd Single Use Swabs Regular  Pads (Alcohol Swabs) .Marland Kitchen.. 100 7)  Onetouch Test  Strp (Glucose Blood) .... Check Sugar Daily and If Needed 250.00 8)  Voltaren 1 % Gel (Diclofenac Sodium) .... Apply 2 Grams To Area Four Times A Day As Needed. 9)  Bd Swab Single Use Regular  Pads (Alcohol Swabs) .... Use  As Directed To Test Blood Sugar Once Daily 10)  Clotrimazole-Betamethasone 1-0.05 % Crea (Clotrimazole-Betamethasone) .... Apply To Area Two Times A Day  Allergies: 1)  ! Sulfa 2)  ! Sb Ibuprofen (Ibuprofen) 3)  ! Crestor (Rosuvastatin Calcium)  Physical Exam  General:  Well-developed,well-nourished,in no acute distress; alert,appropriate and cooperative throughout examination Msk:  Right shoulder signif for pain with ant elevation, int rotation in extension, reaching to the opposite shoulder,  reaching behind the head and resisted supination of the wrist. No echymosis noted and no swelling appreciated.   Impression & Recommendations:  Problem # 1:  SHOULDER STRAIN, RIGHT (ICD-840.9) Assessment New Recurrence of a problem from years ago that had been resolved and treated that occured again due to trauma. Suggested conservative approach to begin with heat and ice as directed. Cont topical therapy.  One week of Aleve, 2 after brfst and supper. No shoulder activity for one week, then start gradual progressive stretching exercises to eventually become strengthening exercises. RTC if sxs don't improve.  Problem # 2:  OSTEOARTHRITIS, HANDS, BILATERAL (DR Gavin Potters) (ICD-715.94) Assessment: Deteriorated  Hands continue to disfigure and deform, swelling especially at the PIP joints. Will check U.A, RA, ANA ESR again to assess.  His updated medication list for this problem includes:    Hydrocodone-acetaminophen 5-500 Mg Tabs (Hydrocodone-acetaminophen) .Marland Kitchen... 1 every 4 to 6 hours as needed  Discussed use of medications, application of heat or cold, and exercises.   Complete Medication List: 1)  Levothroid 75 Mcg Tabs (Levothyroxine sodium) .... One tab by mouth per day 2)  Benazepril-hydrochlorothiazide 20-12.5 Mg Tabs (Benazepril-hydrochlorothiazide) .Marland Kitchen.. 1 daily by mouth 3)  Glyburide-metformin 2.5-500 Mg Tabs (Glyburide-metformin) .Marland Kitchen.. 1 tablet am and 2 tablets at supper 4)  Hydrocodone-acetaminophen 5-500 Mg Tabs (Hydrocodone-acetaminophen) .Marland Kitchen.. 1 every 4 to 6 hours as needed 5)  Pravachol 40 Mg Tabs (Pravastatin sodium) .... 2 tabs by mouth at night 6)  Bd Single Use Swabs Regular Pads (Alcohol swabs) .Marland Kitchen.. 100 7)  Onetouch Test Strp (Glucose blood) .... Check sugar daily and if needed 250.00 8)  Voltaren 1 % Gel (Diclofenac sodium) .... Apply 2 grams to area four times a day as needed. 9)  Bd Swab Single Use Regular Pads (Alcohol swabs) .... Use as directed to test blood sugar  once daily 10)  Clotrimazole-betamethasone 1-0.05 % Crea (Clotrimazole-betamethasone) .... Apply to area two times a day  Patient Instructions: 1)  Use heat and ice as discussed, use topical as he is, take Aleve 2 tabs after brfst and supper for one week only. Call if no better for referral to [facility] for eval and trmt. 2)  If better, start ROM exercises and stretching. 3)  Will check for Gout and arthritis due to finger deformities with PE labs later this month.   Orders Added: 1)  Est. Patient Level III [04540]    Current Allergies (reviewed today): ! SULFA ! SB IBUPROFEN (IBUPROFEN) ! CRESTOR (ROSUVASTATIN CALCIUM)

## 2010-11-17 ENCOUNTER — Other Ambulatory Visit: Payer: Self-pay | Admitting: Family Medicine

## 2010-11-17 ENCOUNTER — Other Ambulatory Visit (INDEPENDENT_AMBULATORY_CARE_PROVIDER_SITE_OTHER): Payer: Medicare Other

## 2010-11-17 ENCOUNTER — Encounter: Payer: Self-pay | Admitting: Family Medicine

## 2010-11-17 ENCOUNTER — Encounter (INDEPENDENT_AMBULATORY_CARE_PROVIDER_SITE_OTHER): Payer: Self-pay | Admitting: *Deleted

## 2010-11-17 DIAGNOSIS — E039 Hypothyroidism, unspecified: Secondary | ICD-10-CM

## 2010-11-17 DIAGNOSIS — E673 Hypervitaminosis D: Secondary | ICD-10-CM

## 2010-11-17 DIAGNOSIS — R972 Elevated prostate specific antigen [PSA]: Secondary | ICD-10-CM

## 2010-11-17 DIAGNOSIS — D696 Thrombocytopenia, unspecified: Secondary | ICD-10-CM

## 2010-11-17 DIAGNOSIS — E119 Type 2 diabetes mellitus without complications: Secondary | ICD-10-CM

## 2010-11-17 DIAGNOSIS — Z125 Encounter for screening for malignant neoplasm of prostate: Secondary | ICD-10-CM

## 2010-11-17 DIAGNOSIS — E7800 Pure hypercholesterolemia, unspecified: Secondary | ICD-10-CM

## 2010-11-17 LAB — MICROALBUMIN / CREATININE URINE RATIO
Microalb Creat Ratio: 12.4 mg/g (ref 0.0–30.0)
Microalb, Ur: 9.1 mg/dL — ABNORMAL HIGH (ref 0.0–1.9)

## 2010-11-17 LAB — RENAL FUNCTION PANEL
BUN: 20 mg/dL (ref 6–23)
CO2: 28 mEq/L (ref 19–32)
Calcium: 9.3 mg/dL (ref 8.4–10.5)
Chloride: 104 mEq/L (ref 96–112)
Creatinine, Ser: 1.2 mg/dL (ref 0.4–1.5)
Phosphorus: 3.7 mg/dL (ref 2.3–4.6)
Potassium: 4.4 mEq/L (ref 3.5–5.1)
Sodium: 140 mEq/L (ref 135–145)

## 2010-11-17 LAB — CBC WITH DIFFERENTIAL/PLATELET
Basophils Relative: 0.4 % (ref 0.0–3.0)
Eosinophils Absolute: 0.2 10*3/uL (ref 0.0–0.7)
Eosinophils Relative: 3.2 % (ref 0.0–5.0)
HCT: 38.4 % — ABNORMAL LOW (ref 39.0–52.0)
Hemoglobin: 13.1 g/dL (ref 13.0–17.0)
MCV: 91.1 fl (ref 78.0–100.0)
Monocytes Absolute: 0.6 10*3/uL (ref 0.1–1.0)
Monocytes Relative: 7.2 % (ref 3.0–12.0)
WBC: 7.8 10*3/uL (ref 4.5–10.5)

## 2010-11-17 LAB — HEPATIC FUNCTION PANEL
ALT: 48 U/L (ref 0–53)
Alkaline Phosphatase: 66 U/L (ref 39–117)
Total Bilirubin: 0.8 mg/dL (ref 0.3–1.2)
Total Protein: 6.5 g/dL (ref 6.0–8.3)

## 2010-11-17 LAB — LIPID PANEL
Cholesterol: 108 mg/dL (ref 0–200)
VLDL: 15.2 mg/dL (ref 0.0–40.0)

## 2010-11-17 LAB — T4, FREE: Free T4: 1.25 ng/dL (ref 0.60–1.60)

## 2010-11-17 LAB — VITAMIN B12: Vitamin B-12: 1244 pg/mL — ABNORMAL HIGH (ref 211–911)

## 2010-11-23 ENCOUNTER — Encounter: Payer: Self-pay | Admitting: Family Medicine

## 2010-11-23 ENCOUNTER — Encounter (INDEPENDENT_AMBULATORY_CARE_PROVIDER_SITE_OTHER): Payer: Medicare Other | Admitting: Family Medicine

## 2010-11-23 DIAGNOSIS — Z0000 Encounter for general adult medical examination without abnormal findings: Secondary | ICD-10-CM

## 2010-11-30 ENCOUNTER — Encounter: Payer: Self-pay | Admitting: Family Medicine

## 2010-11-30 NOTE — Letter (Signed)
Summary: Nature conservation officer [employer] Wellness Visit Questionnaire   [employer] Annual Wellness Visit Questionnaire   Imported By: Beau Fanny 11/23/2010 16:23:52  _____________________________________________________________________  External Attachment:    Type:   Image     Comment:   External Document

## 2010-11-30 NOTE — Letter (Signed)
Summary: [facility]  [facility]   Imported By: Sherian Rein 11/20/2010 10:17:58  _____________________________________________________________________  External Attachment:    Type:   Image     Comment:   External Document
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
## 2010-12-06 ENCOUNTER — Encounter: Payer: Self-pay | Admitting: Rheumatology

## 2010-12-12 NOTE — Medication Information (Signed)
Summary: Diabetic Supplies   Diabetic Supplies   Imported By: Kassie Mends 12/04/2010 10:32:05  _____________________________________________________________________  External Attachment:    Type:   Image     Comment:   External Document

## 2011-02-09 ENCOUNTER — Ambulatory Visit: Payer: Medicare Other | Admitting: Family Medicine

## 2011-02-09 NOTE — Op Note (Signed)
NAMECEDERICK, BROADNAX                 ACCOUNT NO.:  [id number]   MEDICAL RECORD NO.:  [id number]          PATIENT TYPE:  AMB   LOCATION:  DSC                          FACILITY:  [facility]   PHYSICIAN:  Tennis Must Meyerdierks, M.D.DATE OF BIRTH:  11/10/1929   DATE OF PROCEDURE:  DATE OF DISCHARGE:                                 OPERATIVE REPORT   PREOPERATIVE DIAGNOSIS:  Mucous cyst, left thumb.   POSTOPERATIVE DIAGNOSIS:  Mucous cyst, left thumb.   PROCEDURE:  Excision of mucous cyst with debridement of interphalangeal  joint, left thumb.   SURGEON:  Lowell Bouton, M.D.   ANESTHESIA:  Marcaine 0.5% local with sedation.   OPERATIVE FINDINGS:  The patient had a very large, mucous cyst that extended  out to the eponychial fold.  It appeared to arise from the IP joint which  had spur formation, mainly radially.   DESCRIPTION OF PROCEDURE:  Under 0.5% Marcaine local anesthesia with a  tourniquet on the left arm, the left hand was prepped and draped in the  usual fashion.  After exsanguinating the limb, the tourniquet was inflated  to 250 mmHg.  A Y-shaped incision was made on the dorsum of the thumb and  carried down sharply through the subcutaneous tissues.  Bleeding points were  coagulated.   The skin was carefully dissected distally off of the cyst and cyst was  bluntly dissected out.  It was removed in its entirety and the stalk going  out to the IP joint was identified and removed. The IP joint was then opened  radially between the extensor tendon and the collateral ligament.  A rongeur  was used to debride any osteophytes from the joint.  The wound was irrigated  with saline.  Skin was closed with 4-0 nylon sutures.  Sterile dressings  were applied.  The patient went to the recovery room, awake and in good  condition.       EMM/MEDQ  D:  06/14/2004  T:  06/15/2004  Job:  [id number]   cc:   Laurita Quint, M.D.  [address] [place]  [place] [address]  Fax: [phone]

## 2011-02-14 ENCOUNTER — Other Ambulatory Visit: Payer: Medicare Other

## 2011-02-14 ENCOUNTER — Ambulatory Visit: Payer: Self-pay | Admitting: Family Medicine

## 2011-02-14 ENCOUNTER — Encounter: Payer: Self-pay | Admitting: Family Medicine

## 2011-02-14 ENCOUNTER — Ambulatory Visit (INDEPENDENT_AMBULATORY_CARE_PROVIDER_SITE_OTHER): Payer: Medicare Other | Admitting: Family Medicine

## 2011-02-14 ENCOUNTER — Other Ambulatory Visit: Payer: Self-pay | Admitting: Family Medicine

## 2011-02-14 VITALS — BP 116/60 | HR 60 | Temp 98.7°F | Ht 68.0 in | Wt 159.8 lb

## 2011-02-14 DIAGNOSIS — R1032 Left lower quadrant pain: Secondary | ICD-10-CM

## 2011-02-14 MED ORDER — BD SWAB SINGLE USE REGULAR PADS: MEDICATED_PAD | Status: DC

## 2011-02-14 MED ORDER — CIPROFLOXACIN HCL 500 MG PO TABS: 500.0000 mg | ORAL_TABLET | Freq: Two times a day (BID) | ORAL | Status: DC

## 2011-02-14 MED ORDER — METRONIDAZOLE 250 MG PO TABS: ORAL_TABLET | ORAL | Status: AC

## 2011-02-14 NOTE — Patient Instructions (Addendum)
Refer for abd pelvic CT 25 mins spent with pt and signif other.

## 2011-02-14 NOTE — Progress Notes (Signed)
  Subjective:    Patient ID: Aaron Becker, male    DOB: 1929-10-06, 77 y.o.   MRN: [id number]  HPI    Review of Systems     Objective:   Physical Exam        Assessment & Plan:

## 2011-02-14 NOTE — Progress Notes (Signed)
  Subjective:    Patient ID: Aaron Becker, male    DOB: 1930-09-13, 77 y.o.   MRN: [id number]  HPI Pt here as acute appt same day for fever and rectal bleeding. Fever started last Thu at 100.1 asnd given ASA to bring down the temp. Last Thu AM he had somre discomfort through the lower abd. He had a very sore spot on the LLQ that he could barely touch. He had difficulty in the bathroom with significant blood in the commode yesterday. He has never had a colonoscopy. He has had red blood twice this AM, no pain with defecation. He had more gas than stool. He has a swelling in the LLQ also. His apppetite has been ok today although off some the last few days. HE has been drinking lots of liquids. He has done no travelling. He has had no exposure. He had some grapes Wed nite prior to the sxs starting, eats them whole with the seeds.     Review of SystemsNoncontributory except as above.       Objective:   Physical Exam  Constitutional: He appears well-developed and well-nourished. No distress.       Nontoxic and nondistressed appearing.  HENT:  Head: Normocephalic and atraumatic.  Right Ear: External ear normal.  Left Ear: External ear normal.  Nose: Nose normal.  Mouth/Throat: Oropharynx is clear and moist.  Eyes: Conjunctivae and EOM are normal. Pupils are equal, round, and reactive to light. Right eye exhibits no discharge. Left eye exhibits no discharge.  Neck: Normal range of motion. Neck supple.  Cardiovascular: Normal rate and regular rhythm.   Pulmonary/Chest: Effort normal and breath sounds normal. He has no wheezes.  Abdominal: Soft. Bowel sounds are normal. He exhibits distension (slight distention in the LLQ where he is tender. No overt mass felt.). There is tenderness (minimal tenderness in the LLQ, no rebound or refeered. No tenderness Iin RUQ or LUQ, slight discomfort in the RLQ closer to the midline.Marland Kitchen).       No rebound or referred.  Lymphadenopathy:    He has no cervical adenopathy.   Skin: He is not diaphoretic.          Assessment & Plan:

## 2011-02-14 NOTE — Assessment & Plan Note (Addendum)
Location very suspicious for diverticulitis. Pt had grapes. Upon questioning, his sister had blockage from diverticulitis and had partial bowel resection. Will start Abs and get CT.  02/14/2011 335PM  Call from [facility] radiology..the patient with evidence of inflamed ,irritated descending colon/sigmoid interface diverticuli w/o overt evidence of abscess. Should respond to AB therapy. RNS

## 2011-02-14 NOTE — Telephone Encounter (Signed)
Opened in error

## 2011-02-15 ENCOUNTER — Telehealth: Payer: Self-pay | Admitting: *Deleted

## 2011-02-15 NOTE — Telephone Encounter (Signed)
Spoke with pt and his significant other. He had been doing well and then had some kind of spell just before I called where he appeared ready to pass out and was unarousable for a few seconds..now doing fine. They were just ready to eat but sugar in the 80s and BP fine. Sounds vasovagal but cautioned them to keep vigilance and call tomm with report on how he is doing.

## 2011-02-15 NOTE — Telephone Encounter (Signed)
Drenda Freeze called to give you update on patient.Patient has a fever of 99.4. He did start the medication yesterday, he is still having blood in his stool.

## 2011-02-16 ENCOUNTER — Telehealth: Payer: Self-pay | Admitting: *Deleted

## 2011-02-16 NOTE — Telephone Encounter (Signed)
Pt' friend called to report that pt's vitals are better today- BP 117/62, pulse 72, temp 97.9.  He had a seizure last night, which she says you are aware of, and he is not yet feeling normal today. She wanted you to know that pt is now passing red clots in his stool, no loose blood.  Follow up appt made for 5/31.
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
## 2011-02-22 ENCOUNTER — Encounter: Payer: Self-pay | Admitting: Family Medicine

## 2011-02-22 ENCOUNTER — Ambulatory Visit (INDEPENDENT_AMBULATORY_CARE_PROVIDER_SITE_OTHER): Payer: Medicare Other | Admitting: Family Medicine

## 2011-02-22 VITALS — BP 124/74 | HR 76 | Temp 97.7°F | Wt 159.5 lb

## 2011-02-22 DIAGNOSIS — K5732 Diverticulitis of large intestine without perforation or abscess without bleeding: Secondary | ICD-10-CM | POA: Insufficient documentation

## 2011-02-22 DIAGNOSIS — K5790 Diverticulosis of intestine, part unspecified, without perforation or abscess without bleeding: Secondary | ICD-10-CM

## 2011-02-22 DIAGNOSIS — K5730 Diverticulosis of large intestine without perforation or abscess without bleeding: Secondary | ICD-10-CM

## 2011-02-22 DIAGNOSIS — R1032 Left lower quadrant pain: Secondary | ICD-10-CM

## 2011-02-22 MED ORDER — BENAZEPRIL HCL 20 MG PO TABS: 20.0000 mg | ORAL_TABLET | Freq: Every day | ORAL | Status: DC

## 2011-02-22 MED ORDER — CIPROFLOXACIN HCL 500 MG PO TABS: 500.0000 mg | ORAL_TABLET | Freq: Two times a day (BID) | ORAL | Status: AC

## 2011-02-22 NOTE — Patient Instructions (Signed)
[ ]   RTC one week

## 2011-02-22 NOTE — Progress Notes (Signed)
  Subjective:    Patient ID: Aaron Becker, male    DOB: October 10, 1929, 77 y.o.   MRN: [id number]  HPIPt here for one week followup of presumed diverticulitis , s/p CT showing no acute abscess. He has been on Flagyl and Cipro. He is much better but not back to his usual self yet, somewhat mildly sluggish and still a slight bulge in the lower left abdomen altho no tenderness. His BMs are back to reasonably nml and no blood for 36hrs now. His appetite is not yet nml. He tolerated his meds well. He finished the Flagyl Tues.    Review of SystemsNoncontributory except as above.       Objective:   Physical Exam  Constitutional: He is oriented to person, place, and time. He appears well-developed and well-nourished. No distress.  HENT:  Head: Normocephalic and atraumatic.  Right Ear: External ear normal.  Left Ear: External ear normal.  Nose: Nose normal.  Mouth/Throat: Oropharynx is clear and moist.  Eyes: Conjunctivae and EOM are normal. Pupils are equal, round, and reactive to light. Right eye exhibits no discharge. Left eye exhibits no discharge. No scleral icterus.  Neck: Normal range of motion. Neck supple. No thyromegaly present.  Cardiovascular: Normal rate, regular rhythm, normal heart sounds and intact distal pulses.   No murmur heard. Pulmonary/Chest: Effort normal and breath sounds normal. No respiratory distress. He has no wheezes.  Abdominal: Soft. Bowel sounds are normal. He exhibits no distension and no mass. There is no tenderness. There is no rebound and no guarding.  Genitourinary: Rectum normal, prostate normal and penis normal. Guaiac negative stool.  Musculoskeletal: Normal range of motion. He exhibits no edema.  Lymphadenopathy:    He has no cervical adenopathy.  Neurological: He is alert and oriented to person, place, and time. Coordination normal.  Skin: Skin is warm and dry. No rash noted. He is not diaphoretic.  Psychiatric: He has a normal mood and affect. His behavior  is normal. Judgment and thought content normal.          Assessment & Plan:

## 2011-02-22 NOTE — Assessment & Plan Note (Signed)
Diveriticulitis...improved. Extend Cipro another 7 days and see back.

## 2011-02-23 ENCOUNTER — Encounter: Payer: Self-pay | Admitting: Family Medicine

## 2011-03-01 ENCOUNTER — Ambulatory Visit (INDEPENDENT_AMBULATORY_CARE_PROVIDER_SITE_OTHER): Payer: Medicare Other | Admitting: Family Medicine

## 2011-03-01 ENCOUNTER — Encounter: Payer: Self-pay | Admitting: Family Medicine

## 2011-03-01 DIAGNOSIS — R609 Edema, unspecified: Secondary | ICD-10-CM | POA: Insufficient documentation

## 2011-03-01 DIAGNOSIS — K5730 Diverticulosis of large intestine without perforation or abscess without bleeding: Secondary | ICD-10-CM

## 2011-03-01 DIAGNOSIS — I10 Essential (primary) hypertension: Secondary | ICD-10-CM

## 2011-03-01 DIAGNOSIS — K5790 Diverticulosis of intestine, part unspecified, without perforation or abscess without bleeding: Secondary | ICD-10-CM

## 2011-03-01 MED ORDER — HYDROCHLOROTHIAZIDE 12.5 MG PO CAPS: 12.5000 mg | ORAL_CAPSULE | Freq: Every day | ORAL | Status: DC

## 2011-03-01 MED ORDER — BENAZEPRIL HCL 10 MG PO TABS: 10.0000 mg | ORAL_TABLET | Freq: Every day | ORAL | Status: DC

## 2011-03-01 NOTE — Assessment & Plan Note (Signed)
Itis improved, essentially appears resolved. RTC quickly if sxs worsen.

## 2011-03-01 NOTE — Assessment & Plan Note (Signed)
ue to such low pressure, with adding back HCTZ will decrease Benazepril. Recheck all at PE in Sep.

## 2011-03-01 NOTE — Progress Notes (Signed)
  Subjective:    Patient ID: Aaron Becker, male    DOB: 10-Oct-1929, 77 y.o.   MRN: [id number]  HPI Pt here again for followup of presumed Diverticulitis with CT showing no abscess who was treated with dual ABs and has tolerated well. He is feeling well and has no complaints. Tenderness is almost totally gone. He still has a minimally discernible place of distention on the LLQ of the abdomen. BMs regular and soft. No blood for over a week. He has gained 4 pounds since stopping the diuretic. He has no SOB nor dyspnea.    Review of Systems Noncontributory     Objective:   Physical Exam  Constitutional: He appears well-developed and well-nourished. No distress.  HENT:  Head: Normocephalic and atraumatic.  Right Ear: External ear normal.  Left Ear: External ear normal.  Nose: Nose normal.  Mouth/Throat: Oropharynx is clear and moist.  Eyes: Conjunctivae and EOM are normal. Pupils are equal, round, and reactive to light. Right eye exhibits no discharge. Left eye exhibits no discharge.  Neck: Normal range of motion. Neck supple.  Cardiovascular: Normal rate and regular rhythm.   Pulmonary/Chest: Effort normal and breath sounds normal. He has no wheezes.       Lungs CTA.  Lymphadenopathy:    He has no cervical adenopathy.  Skin: Skin is warm and dry. He is not diaphoretic.       1+ pitting edema to midshin.          Assessment & Plan:

## 2011-03-01 NOTE — Assessment & Plan Note (Signed)
Appears top have slight fluid overload since stopping HCTZ. Will add back.

## 2011-05-15 ENCOUNTER — Emergency Department: Payer: Self-pay | Admitting: *Deleted

## 2011-05-17 ENCOUNTER — Encounter: Payer: Self-pay | Admitting: Family Medicine

## 2011-05-17 ENCOUNTER — Other Ambulatory Visit: Payer: Self-pay | Admitting: Family Medicine

## 2011-05-17 ENCOUNTER — Ambulatory Visit (INDEPENDENT_AMBULATORY_CARE_PROVIDER_SITE_OTHER): Payer: Medicare Other | Admitting: Family Medicine

## 2011-05-17 DIAGNOSIS — E119 Type 2 diabetes mellitus without complications: Secondary | ICD-10-CM

## 2011-05-17 DIAGNOSIS — I10 Essential (primary) hypertension: Secondary | ICD-10-CM

## 2011-05-17 DIAGNOSIS — T2016XA Burn of first degree of forehead and cheek, initial encounter: Secondary | ICD-10-CM | POA: Insufficient documentation

## 2011-05-17 NOTE — Assessment & Plan Note (Signed)
Good control on current dose of medication. Cont. BP Readings from Last 3 Encounters:  05/17/11 130/62  03/01/11 104/60  02/22/11 124/74
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
## 2011-05-25 ENCOUNTER — Other Ambulatory Visit (INDEPENDENT_AMBULATORY_CARE_PROVIDER_SITE_OTHER): Payer: Medicare Other

## 2011-05-25 DIAGNOSIS — E119 Type 2 diabetes mellitus without complications: Secondary | ICD-10-CM

## 2011-05-25 NOTE — Progress Notes (Signed)
Addended by: Baldomero Lamy on: 05/25/2011 09:13 AM   Modules accepted: Orders

## 2011-05-31 ENCOUNTER — Encounter: Payer: Self-pay | Admitting: Family Medicine

## 2011-05-31 ENCOUNTER — Ambulatory Visit (INDEPENDENT_AMBULATORY_CARE_PROVIDER_SITE_OTHER): Payer: Medicare Other | Admitting: Family Medicine

## 2011-05-31 DIAGNOSIS — T2016XA Burn of first degree of forehead and cheek, initial encounter: Secondary | ICD-10-CM

## 2011-05-31 DIAGNOSIS — I10 Essential (primary) hypertension: Secondary | ICD-10-CM

## 2011-05-31 DIAGNOSIS — E119 Type 2 diabetes mellitus without complications: Secondary | ICD-10-CM

## 2011-05-31 DIAGNOSIS — R1032 Left lower quadrant pain: Secondary | ICD-10-CM

## 2011-05-31 DIAGNOSIS — IMO0001 Reserved for inherently not codable concepts without codable children: Secondary | ICD-10-CM

## 2011-05-31 DIAGNOSIS — J3489 Other specified disorders of nose and nasal sinuses: Secondary | ICD-10-CM

## 2011-05-31 DIAGNOSIS — R609 Edema, unspecified: Secondary | ICD-10-CM

## 2011-05-31 NOTE — Assessment & Plan Note (Signed)
Good control. A1C improved to under 7.  Congrats and cont curr lifestyle and meds. Lab Results  Component Value Date   HGBA1C 6.9* 05/25/2011

## 2011-05-31 NOTE — Progress Notes (Signed)
  Subjective:    Patient ID: Aaron Becker, male    DOB: 03-21-1930, 77 y.o.   MRN: [id number]  HPI Pt here with his significant other for 6 month followup. He has been treated for diverticulitis and has done well with residual swelling LLQ last followup visit. His BP has been fine but he was accumulating lfuid off diuretic so started him back on HCTZ with his benzapril which was decreased due to BP control.  He feels well today and has no complaints. He has lost two pounds since last visit and his edema is gone.  He continues to watch his diet with his diabetes and has been more careful since his PE.     Review of Systems  Constitutional: Negative for fever, chills, diaphoresis, activity change, appetite change and fatigue.  HENT: Negative for hearing loss, ear pain, congestion, sore throat, rhinorrhea, neck pain, neck stiffness, postnasal drip, sinus pressure, tinnitus and ear discharge.   Eyes: Negative for pain, discharge and visual disturbance.  Respiratory: Negative for cough, shortness of breath and wheezing.   Cardiovascular: Negative for chest pain and palpitations.       No SOB w/ exertion  Gastrointestinal: Negative for nausea, abdominal pain, diarrhea, constipation and abdominal distention.       No heartburn or swallowing problems.  Genitourinary:       No nocturia  Skin:       No itching or dryness.  Neurological:       No tingling or balance problems.  All other systems reviewed and are negative.       Objective:   Physical Exam  Constitutional: He appears well-developed and well-nourished. No distress.  HENT:  Head: Normocephalic and atraumatic.  Right Ear: External ear normal.  Left Ear: External ear normal.  Nose: Nose normal.  Mouth/Throat: Oropharynx is clear and moist.  Eyes: Conjunctivae and EOM are normal. Pupils are equal, round, and reactive to light. Right eye exhibits no discharge. Left eye exhibits no discharge.  Neck: Normal range of motion. Neck  supple.  Cardiovascular: Normal rate and regular rhythm.   Pulmonary/Chest: Effort normal and breath sounds normal. He has no wheezes.  Abdominal: Soft. Bowel sounds are normal. He exhibits no distension. There is no tenderness. There is no rebound and no guarding.       Swelling in LLQ resolved.  Lymphadenopathy:    He has no cervical adenopathy.  Skin: He is not diaphoretic.          Assessment & Plan:

## 2011-05-31 NOTE — Assessment & Plan Note (Signed)
Resolved

## 2011-05-31 NOTE — Assessment & Plan Note (Signed)
Good control. Cont curr meds. Edema gone. Cont HCTZ.

## 2011-05-31 NOTE — Assessment & Plan Note (Signed)
Resolved Diverticulitis. Discussed poss recurrence in the future.

## 2011-05-31 NOTE — Assessment & Plan Note (Signed)
See instructions. Return if worsens.

## 2011-05-31 NOTE — Assessment & Plan Note (Signed)
Resolved. Looks good.

## 2011-05-31 NOTE — Patient Instructions (Signed)
RTC for Comp Exam in 6 months with Dr Para March. Take Guaifenesin (400mg ), take 11/2 tabs by mouth AM and NOON. Get GUAIFENESIN by  going to [facility] or [facility] and getting MUCOUS RELIEF EXPECTORANT/CONGESTION. DO NOT GET MUCINEX (Timed Release Guaifenesin)

## 2011-06-06 ENCOUNTER — Encounter: Payer: Self-pay | Admitting: Family Medicine

## 2011-06-06 ENCOUNTER — Ambulatory Visit (INDEPENDENT_AMBULATORY_CARE_PROVIDER_SITE_OTHER): Payer: Medicare Other | Admitting: Family Medicine

## 2011-06-06 VITALS — BP 140/78 | HR 68 | Temp 97.0°F | Wt 162.2 lb

## 2011-06-06 DIAGNOSIS — I10 Essential (primary) hypertension: Secondary | ICD-10-CM

## 2011-06-06 DIAGNOSIS — R1031 Right lower quadrant pain: Secondary | ICD-10-CM

## 2011-06-06 DIAGNOSIS — K4090 Unilateral inguinal hernia, without obstruction or gangrene, not specified as recurrent: Secondary | ICD-10-CM | POA: Insufficient documentation

## 2011-06-06 NOTE — Progress Notes (Signed)
  Subjective:    Patient ID: Aaron Becker, male    DOB: May 16, 1930, 77 y.o.   MRN: [id number]  HPI Pt here with signif other for tenderness in the right side of the around McBurney's [point with burning down into the right inguinal area. They wanted to make sure that he wasn't having the start of diverticular inflammation again. He has felt a pulling into the right inguinal canal. He has not felt a discreet knot in the area but does think he has a fullness there at times.    Review of Systems  Constitutional: Negative for fever, chills, diaphoresis, activity change, appetite change and fatigue.  HENT: Negative for hearing loss, ear pain, congestion, sore throat, rhinorrhea, neck pain, neck stiffness, postnasal drip, sinus pressure, tinnitus and ear discharge.   Eyes: Negative for pain, discharge and visual disturbance.  Respiratory: Negative for cough, shortness of breath and wheezing.   Cardiovascular: Negative for chest pain and palpitations.       No SOB w/ exertion  Gastrointestinal:       No heartburn or swallowing problems.  Genitourinary:       No nocturia  Skin:       No itching or dryness.  Neurological:       No tingling or balance problems.  All other systems reviewed and are negative.       Objective:   Physical Exam  Constitutional: He appears well-developed and well-nourished. No distress.  HENT:  Head: Normocephalic and atraumatic.  Right Ear: External ear normal.  Left Ear: External ear normal.  Nose: Nose normal.  Mouth/Throat: Oropharynx is clear and moist.  Eyes: Conjunctivae and EOM are normal. Pupils are equal, round, and reactive to light. Right eye exhibits no discharge. Left eye exhibits no discharge.  Neck: Normal range of motion. Neck supple.  Cardiovascular: Normal rate and regular rhythm.   Pulmonary/Chest: Effort normal and breath sounds normal. He has no wheezes.  Genitourinary:       Some fullness in the right inguinal canal vs the left with no  discreet bulge felt with straining. No RLQ mass. No RLQ tenderness. Abd otherwise benign.  Lymphadenopathy:    He has no cervical adenopathy.  Skin: He is not diaphoretic.          Assessment & Plan:

## 2011-06-06 NOTE — Patient Instructions (Signed)
Refer to Dr Lemar Livings for hernia eval.

## 2011-06-06 NOTE — Assessment & Plan Note (Signed)
Will refer to Dr Adela Glimpse in [place] for eval. Do not think diverticuli are the problem. Could be a hernia, poss higher up than in the inguinal canal.

## 2011-06-06 NOTE — Assessment & Plan Note (Signed)
Slightly elevated today. Will follow as it has been great previously. BP Readings from Last 3 Encounters:  06/06/11 140/78  05/31/11 104/60  05/17/11 130/62

## 2011-06-19 ENCOUNTER — Other Ambulatory Visit: Payer: Self-pay | Admitting: *Deleted

## 2011-06-19 ENCOUNTER — Ambulatory Visit (INDEPENDENT_AMBULATORY_CARE_PROVIDER_SITE_OTHER): Payer: Medicare Other

## 2011-06-19 DIAGNOSIS — Z23 Encounter for immunization: Secondary | ICD-10-CM

## 2011-06-19 MED ORDER — GLUCOSE BLOOD VI STRP: ORAL_STRIP | Status: DC

## 2011-06-26 ENCOUNTER — Telehealth: Payer: Self-pay | Admitting: *Deleted

## 2011-06-26 MED ORDER — LEVOTHYROXINE SODIUM 75 MCG PO TABS: 75.0000 ug | ORAL_TABLET | Freq: Every day | ORAL | Status: DC

## 2011-06-26 NOTE — Telephone Encounter (Signed)
Pharmacist called to report that levothroid is on back order from the manufacturer- all doses, indefinetely.  She is asking that this be changed to something else.

## 2011-08-03 ENCOUNTER — Ambulatory Visit: Payer: Self-pay | Admitting: General Surgery

## 2011-08-09 ENCOUNTER — Encounter: Payer: Self-pay | Admitting: Family Medicine

## 2011-08-24 ENCOUNTER — Ambulatory Visit: Payer: Self-pay | Admitting: General Surgery

## 2011-08-24 DIAGNOSIS — I10 Essential (primary) hypertension: Secondary | ICD-10-CM

## 2011-09-03 ENCOUNTER — Ambulatory Visit: Payer: Self-pay | Admitting: General Surgery

## 2011-09-25 HISTORY — PX: HERNIA REPAIR: SHX51

## 2011-10-01 ENCOUNTER — Encounter: Payer: Self-pay | Admitting: Family Medicine

## 2011-10-29 ENCOUNTER — Other Ambulatory Visit: Payer: Self-pay | Admitting: *Deleted

## 2011-10-29 MED ORDER — GLYBURIDE-METFORMIN 2.5-500 MG PO TABS: ORAL_TABLET | ORAL | Status: DC

## 2011-11-21 ENCOUNTER — Encounter: Payer: Self-pay | Admitting: Family Medicine

## 2011-11-21 DIAGNOSIS — R972 Elevated prostate specific antigen [PSA]: Secondary | ICD-10-CM | POA: Insufficient documentation

## 2011-11-27 ENCOUNTER — Other Ambulatory Visit: Payer: Self-pay | Admitting: *Deleted

## 2011-11-27 NOTE — Telephone Encounter (Signed)
ERROR

## 2011-11-29 ENCOUNTER — Other Ambulatory Visit (INDEPENDENT_AMBULATORY_CARE_PROVIDER_SITE_OTHER): Payer: Medicare Other

## 2011-11-29 ENCOUNTER — Other Ambulatory Visit: Payer: Self-pay | Admitting: Family Medicine

## 2011-11-29 DIAGNOSIS — E119 Type 2 diabetes mellitus without complications: Secondary | ICD-10-CM

## 2011-11-29 DIAGNOSIS — E039 Hypothyroidism, unspecified: Secondary | ICD-10-CM

## 2011-11-29 LAB — HEMOGLOBIN A1C: Hgb A1c MFr Bld: 6.9 % — ABNORMAL HIGH (ref 4.6–6.5)

## 2011-11-29 LAB — LIPID PANEL
Cholesterol: 113 mg/dL (ref 0–200)
Triglycerides: 75 mg/dL (ref 0.0–149.0)
VLDL: 15 mg/dL (ref 0.0–40.0)

## 2011-11-29 LAB — COMPREHENSIVE METABOLIC PANEL
AST: 30 U/L (ref 0–37)
Calcium: 9.1 mg/dL (ref 8.4–10.5)
Creatinine, Ser: 1.2 mg/dL (ref 0.4–1.5)
GFR: 62.61 mL/min (ref >60.00)
Potassium: 4.3 mEq/L (ref 3.5–5.1)
Sodium: 140 mEq/L (ref 135–145)

## 2011-11-29 LAB — TSH: TSH: 0.49 u[IU]/mL (ref 0.35–5.50)

## 2011-12-04 ENCOUNTER — Ambulatory Visit (INDEPENDENT_AMBULATORY_CARE_PROVIDER_SITE_OTHER): Payer: Medicare Other | Admitting: Family Medicine

## 2011-12-04 ENCOUNTER — Encounter: Payer: Self-pay | Admitting: Family Medicine

## 2011-12-04 VITALS — BP 142/70 | HR 70 | Temp 98.2°F | Ht 67.0 in | Wt 170.0 lb

## 2011-12-04 DIAGNOSIS — R413 Other amnesia: Secondary | ICD-10-CM

## 2011-12-04 DIAGNOSIS — E7800 Pure hypercholesterolemia, unspecified: Secondary | ICD-10-CM

## 2011-12-04 DIAGNOSIS — E119 Type 2 diabetes mellitus without complications: Secondary | ICD-10-CM

## 2011-12-04 DIAGNOSIS — Z7189 Other specified counseling: Secondary | ICD-10-CM

## 2011-12-04 DIAGNOSIS — I10 Essential (primary) hypertension: Secondary | ICD-10-CM

## 2011-12-04 DIAGNOSIS — E039 Hypothyroidism, unspecified: Secondary | ICD-10-CM

## 2011-12-04 NOTE — Patient Instructions (Signed)
Check with your insurance to see if they will cover the shingles shot. Recheck sugar in 6 months before a visit with Para March.  Take care.  Glad to see you.

## 2011-12-04 NOTE — Progress Notes (Signed)
Mild memory changes.  Noted by patient.  No red flag incidents.  Some changes with name recall.  Doing well o/w.   Diabetes:  Using medications without difficulties:yes Hypoglycemic episodes: only if prolonged fasting Hyperglycemic episodes:no Feet problems:no Blood Sugars averaging: ~100-120 in AM eye exam within last year: yes  Hypertension:    Using medication without problems or lightheadedness: yes Chest pain with exertion:no Edema: trace with varicose veins, controlled with stockings.  Short of breath:no  Elevated Cholesterol: Using medications without problems:yes Muscle aches: no Diet compliance:yes Exercise: some, in the shop, yard work  Hypothyroid.  Complaint with meds.  No ADE, no neck mass, TSH wnl.   PMH and SH reviewed.   Vital signs, Meds and allergies reviewed.  ROS: See HPI.  Otherwise nontributory.   GEN: nad, alert and oriented HEENT: mucous membranes moist NECK: supple w/o LA CV: rrr PULM: ctab, no inc wob ABD: soft, +bs EXT: trace edema with varicose veins noted SKIN: no acute rash  MMSE 26/30, -3 for attention and -1 for copying.  3/3 on recall.   Diabetic foot exam: Normal inspection No skin breakdown No calluses  Normal DP pulses Normal sensation to light tough and monofilament Nails normal

## 2011-12-06 DIAGNOSIS — Z7189 Other specified counseling: Secondary | ICD-10-CM | POA: Insufficient documentation

## 2011-12-06 DIAGNOSIS — R413 Other amnesia: Secondary | ICD-10-CM | POA: Insufficient documentation

## 2011-12-06 NOTE — Assessment & Plan Note (Signed)
Controlled, no change in meds, d/w pt about diet.

## 2011-12-06 NOTE — Assessment & Plan Note (Signed)
Controlled , no change in meds 

## 2011-12-06 NOTE — Assessment & Plan Note (Signed)
No change in meds, I dont' want to induce hypotension.

## 2011-12-06 NOTE — Assessment & Plan Note (Signed)
Mild, will follow.  He agrees.

## 2011-12-06 NOTE — Assessment & Plan Note (Signed)
TSh wnl.  No change in meds.

## 2011-12-24 ENCOUNTER — Other Ambulatory Visit: Payer: Self-pay | Admitting: Family Medicine

## 2011-12-24 NOTE — Telephone Encounter (Signed)
Received refill request electronically from pharmacy. See allergy/contraindiction. Is it okay to refill medication? 

## 2011-12-24 NOTE — Telephone Encounter (Signed)
Okay to fill, tolerates this med.  rx sent.

## 2011-12-27 ENCOUNTER — Other Ambulatory Visit: Payer: Self-pay | Admitting: Family Medicine

## 2012-01-14 ENCOUNTER — Telehealth: Payer: Self-pay

## 2012-01-14 MED ORDER — ZOSTER VACCINE LIVE 19400 UNT/0.65ML ~~LOC~~ SOLR: 0.6500 mL | Freq: Once | SUBCUTANEOUS | Status: DC

## 2012-01-14 NOTE — Telephone Encounter (Signed)
Please phone in so he can pick up.  Set up RN visit if not already done.  Thanks.

## 2012-01-14 NOTE — Telephone Encounter (Signed)
Pt checked with insurance co and pt wants to get order for shingles vaccine and then schedule nurse visit. Clinton Sawyer can be reached at [phone] and detailed message may be left if no answer.

## 2012-01-15 ENCOUNTER — Other Ambulatory Visit: Payer: Self-pay | Admitting: *Deleted

## 2012-01-15 MED ORDER — ZOSTER VACCINE LIVE 19400 UNT/0.65ML ~~LOC~~ SOLR: 0.6500 mL | Freq: Once | SUBCUTANEOUS | Status: AC

## 2012-01-15 NOTE — Telephone Encounter (Signed)
Clinton Sawyer advised.  If the pharmacy will not administer the vaccine, she will be in touch to either make an RN visit appt or to have the Rx printed to carry to another pharmacy.

## 2012-01-17 ENCOUNTER — Encounter: Payer: Self-pay | Admitting: Family Medicine

## 2012-01-28 ENCOUNTER — Other Ambulatory Visit: Payer: Self-pay | Admitting: Family Medicine

## 2012-01-28 NOTE — Telephone Encounter (Signed)
Sent.  Okay to continue, has tolerated in past.

## 2012-01-28 NOTE — Telephone Encounter (Signed)
Received refill request electronically from pharmacy. See allergy/contraindication. Is it okay to refill medication? 

## 2012-02-23 ENCOUNTER — Other Ambulatory Visit: Payer: Self-pay | Admitting: Family Medicine

## 2012-02-25 ENCOUNTER — Other Ambulatory Visit: Payer: Self-pay | Admitting: Family Medicine

## 2012-05-05 ENCOUNTER — Other Ambulatory Visit: Payer: Self-pay | Admitting: *Deleted

## 2012-05-05 MED ORDER — BD SWAB SINGLE USE REGULAR PADS: MEDICATED_PAD | Status: DC

## 2012-05-05 MED ORDER — GLUCOSE BLOOD VI STRP: ORAL_STRIP | Status: DC

## 2012-05-05 NOTE — Telephone Encounter (Signed)
Received faxed refill request from pharmacy. Refill sent to pharmacy electronically. 

## 2012-06-02 ENCOUNTER — Other Ambulatory Visit (INDEPENDENT_AMBULATORY_CARE_PROVIDER_SITE_OTHER): Payer: Medicare Other

## 2012-06-02 DIAGNOSIS — E119 Type 2 diabetes mellitus without complications: Secondary | ICD-10-CM

## 2012-06-05 ENCOUNTER — Ambulatory Visit (INDEPENDENT_AMBULATORY_CARE_PROVIDER_SITE_OTHER): Payer: Medicare Other | Admitting: Family Medicine

## 2012-06-05 ENCOUNTER — Encounter: Payer: Self-pay | Admitting: Family Medicine

## 2012-06-05 VITALS — BP 120/58 | HR 71 | Temp 98.2°F | Wt 170.0 lb

## 2012-06-05 DIAGNOSIS — E119 Type 2 diabetes mellitus without complications: Secondary | ICD-10-CM

## 2012-06-05 DIAGNOSIS — Z7689 Persons encountering health services in other specified circumstances: Secondary | ICD-10-CM

## 2012-06-05 DIAGNOSIS — R413 Other amnesia: Secondary | ICD-10-CM

## 2012-06-05 DIAGNOSIS — Z23 Encounter for immunization: Secondary | ICD-10-CM

## 2012-06-05 NOTE — Progress Notes (Signed)
Diabetes:  Using medications without difficulties:yes Hypoglycemic episodes: very rare, if prolonged fasting Hyperglycemic episodes:no Feet problems:no Blood Sugars averaging: 77 this AM, can be ~120 in AM eye exam within last year: pending f/u- next week A1c is 7.    No sig memory changes per patient. Others agree.  He's always had trouble with peoples names, but this isn't changed.  Has woken up gasping for air.  Noted snoring. D/w pt about OSA and risk.  He'll consider OSA eval, declined referral today.    PMH and SH reviewed  Meds, vitals, and allergies reviewed.   ROS: See HPI.  Otherwise negative.    GEN: nad, alert and oriented HEENT: mucous membranes moist NECK: supple w/o LA CV: rrr. PULM: ctab, no inc wob ABD: soft, +bs EXT: no edema SKIN: no acute rash  Diabetic foot exam: Normal inspection No skin breakdown No calluses  Normal DP pulses Normal sensation to light touch and monofilament Nails normal

## 2012-06-05 NOTE — Patient Instructions (Addendum)
Recheck labs in 6 months before a physical.  Let me know when you want to go see the clinic to check for sleep apnea.  Keep working on [employer].   Take care.  Glad to see you.

## 2012-06-08 DIAGNOSIS — Z7689 Persons encountering health services in other specified circumstances: Secondary | ICD-10-CM | POA: Insufficient documentation

## 2012-06-08 NOTE — Assessment & Plan Note (Signed)
Stable by history, he'll notify me of changes.

## 2012-06-08 NOTE — Assessment & Plan Note (Signed)
Controlled, continue current meds.  D/w pt about weight and diet.  He agrees.  Recheck in about 6 months.

## 2012-06-08 NOTE — Assessment & Plan Note (Signed)
Likely OSA, he declined referral.  He'll consider.  D/w pt about risk of untreated sleep apnea and he understood.

## 2012-06-25 ENCOUNTER — Other Ambulatory Visit: Payer: Self-pay | Admitting: Family Medicine

## 2012-07-23 ENCOUNTER — Other Ambulatory Visit: Payer: Self-pay | Admitting: Family Medicine

## 2012-10-21 ENCOUNTER — Other Ambulatory Visit: Payer: Self-pay | Admitting: Family Medicine

## 2012-11-16 ENCOUNTER — Other Ambulatory Visit: Payer: Self-pay | Admitting: Family Medicine

## 2012-11-20 ENCOUNTER — Other Ambulatory Visit: Payer: Self-pay | Admitting: Family Medicine

## 2012-11-20 DIAGNOSIS — E119 Type 2 diabetes mellitus without complications: Secondary | ICD-10-CM

## 2012-11-27 ENCOUNTER — Other Ambulatory Visit (INDEPENDENT_AMBULATORY_CARE_PROVIDER_SITE_OTHER): Payer: Medicare Other

## 2012-11-27 DIAGNOSIS — E119 Type 2 diabetes mellitus without complications: Secondary | ICD-10-CM

## 2012-11-27 LAB — COMPREHENSIVE METABOLIC PANEL
ALT: 31 U/L (ref 0–53)
AST: 28 U/L (ref 0–37)
Alkaline Phosphatase: 44 U/L (ref 39–117)
BUN: 22 mg/dL (ref 6–23)
Calcium: 9.3 mg/dL (ref 8.4–10.5)
Creatinine, Ser: 1.2 mg/dL (ref 0.4–1.5)
Total Bilirubin: 1.2 mg/dL (ref 0.3–1.2)

## 2012-11-27 LAB — HEMOGLOBIN A1C: Hgb A1c MFr Bld: 7.7 % — ABNORMAL HIGH (ref 4.6–6.5)

## 2012-11-27 LAB — LIPID PANEL
Cholesterol: 98 mg/dL (ref 0–200)
HDL: 46.7 mg/dL (ref >39.00)
LDL Cholesterol: 41 mg/dL (ref 0–99)
Total CHOL/HDL Ratio: 2
Triglycerides: 53 mg/dL (ref 0.0–149.0)
VLDL: 10.6 mg/dL (ref 0.0–40.0)

## 2012-12-04 ENCOUNTER — Ambulatory Visit (INDEPENDENT_AMBULATORY_CARE_PROVIDER_SITE_OTHER): Payer: Medicare Other | Admitting: Family Medicine

## 2012-12-04 ENCOUNTER — Encounter: Payer: Self-pay | Admitting: Family Medicine

## 2012-12-04 VITALS — BP 128/76 | HR 69 | Temp 98.0°F | Wt 165.0 lb

## 2012-12-04 DIAGNOSIS — I10 Essential (primary) hypertension: Secondary | ICD-10-CM

## 2012-12-04 DIAGNOSIS — Z0000 Encounter for general adult medical examination without abnormal findings: Secondary | ICD-10-CM

## 2012-12-04 DIAGNOSIS — E039 Hypothyroidism, unspecified: Secondary | ICD-10-CM

## 2012-12-04 DIAGNOSIS — E7800 Pure hypercholesterolemia, unspecified: Secondary | ICD-10-CM

## 2012-12-04 DIAGNOSIS — E119 Type 2 diabetes mellitus without complications: Secondary | ICD-10-CM

## 2012-12-04 NOTE — Progress Notes (Signed)
I have personally reviewed the Medicare Annual Wellness questionnaire and have noted 1. The patient's medical and social history 2. Their use of alcohol, tobacco or illicit drugs 3. Their current medications and supplements 4. The patient's functional ability including ADL's, fall risks, home safety risks and hearing or visual             impairment. 5. Diet and physical activities 6. Evidence for depression or mood disorders  The patients weight, height, BMI have been recorded in the chart and visual acuity is per eye clinic.  I have made referrals, counseling and provided education to the patient based review of the above and I have provided the pt with a written personalized care plan for preventive services.  See scanned forms.  Routine anticipatory guidance given to patient.  See health maintenance. [id number] Shingles2013 PNA2001 Tetanus2010 Colonoscopy 2012 PSA per uro.  Advance directive d/w pt.  Daughter Synetta Fail designated if incapacitated.   Cognitive function addressed- see scanned forms- and if abnormal then additional documentation follows.   Hypertension:    Using medication without problems or lightheadedness: yes Chest pain with exertion:no Edema:no Short of breath:no  Elevated Cholesterol: Using medications without problems:yes Muscle aches: no Diet compliance:yes Exercise:yes  Diabetes:  Using medications without difficulties:yes Hypoglycemic episodes: only if prolonged fasting Hyperglycemic episodes:no Feet problems:no Blood Sugars averaging: ~100 in AM eye exam within last year: yes Labs d/w pt.   Hypothyroid.  No ant neck pain, no mass, no dysphagia. TSH wnl.   PMH and SH reviewed  Meds, vitals, and allergies reviewed.   ROS: See HPI.  Otherwise negative.    GEN: nad, alert and oriented HEENT: mucous membranes moist NECK: supple w/o LA, no TMG CV: rrr. PULM: ctab, no inc wob ABD: soft, +bs EXT: no edema SKIN: no acute rash  Diabetic foot  exam: Normal inspection No skin breakdown No calluses  Normal DP pulses Normal sensation to light touch and monofilament Nails normal

## 2012-12-04 NOTE — Patient Instructions (Addendum)
Don't change your meds.  Take care.  Call with concerns.   Recheck in 6 months. A1c ahead of time. You don't have to fast for that lab.  Glad to see you.

## 2012-12-04 NOTE — Assessment & Plan Note (Signed)
Reasonable control, continue as is.  No change in meds, would like to avoid hypoglycemia.  Labs d/w pt. Recheck later in 2014.

## 2012-12-04 NOTE — Assessment & Plan Note (Signed)
See scanned forms. Routine anticipatory guidance given to patient. See health maintenance.  [id number]  Shingles2013  PNA2001  Tetanus2010  Colonoscopy 2012  PSA per uro.  Advance directive d/w pt. Daughter Synetta Fail designated if incapacitated.  Cognitive function addressed- see scanned forms- and if abnormal then additional documentation follows.  Normal memory testing on AMW exam today.

## 2012-12-04 NOTE — Assessment & Plan Note (Signed)
Controlled, continue as is.  No change in meds.  Labs d/w pt.  

## 2012-12-22 ENCOUNTER — Other Ambulatory Visit: Payer: Self-pay | Admitting: Family Medicine

## 2013-02-17 ENCOUNTER — Other Ambulatory Visit: Payer: Self-pay | Admitting: Family Medicine

## 2013-03-31 ENCOUNTER — Encounter: Payer: Self-pay | Admitting: Family Medicine

## 2013-03-31 ENCOUNTER — Ambulatory Visit (INDEPENDENT_AMBULATORY_CARE_PROVIDER_SITE_OTHER): Payer: Medicare Other | Admitting: Family Medicine

## 2013-03-31 VITALS — BP 142/70 | HR 65 | Temp 97.9°F | Wt 170.2 lb

## 2013-03-31 DIAGNOSIS — T148 Other injury of unspecified body region: Secondary | ICD-10-CM

## 2013-03-31 DIAGNOSIS — W57XXXA Bitten or stung by nonvenomous insect and other nonvenomous arthropods, initial encounter: Secondary | ICD-10-CM

## 2013-03-31 MED ORDER — DOXYCYCLINE HYCLATE 100 MG PO TABS: 100.0000 mg | ORAL_TABLET | Freq: Two times a day (BID) | ORAL | Status: DC

## 2013-03-31 NOTE — Patient Instructions (Addendum)
Start the antibiotics and let me know if you don't improve.   Get some rest and drink plenty of fluids.  Take care not to get sunburned.

## 2013-03-31 NOTE — Progress Notes (Signed)
Tick bite 03/19/13.  Removed a few days later.  tmax 99.7 in the interval.  He hasn't felt well, w/o specific consistent localizing source.  Some occ back soreness, posterior R lower ribs.  No rash.  He has mild irritation at the tick bite site.  No vomiting, no diarrhea.   Meds, vitals, and allergies reviewed.   ROS: See HPI.  Otherwise, noncontributory.  Nad ncat Tm wnl Nasal and OP exam wnl Neck supple, no LA rrr ctab abd soft Back not ttp Minimal irritation at the tick bite site on his back w/o FB noted under magnification.  No other rash No edema

## 2013-04-01 DIAGNOSIS — W57XXXA Bitten or stung by nonvenomous insect and other nonvenomous arthropods, initial encounter: Secondary | ICD-10-CM | POA: Insufficient documentation

## 2013-04-01 NOTE — Assessment & Plan Note (Signed)
With nospecific sx, general malaise, mildly elevated temp, not >100.4.  Would treat given the history, d/w pt about cautions and rationale.  He agrees.  F/u prn.  Nontoxic.  Okay for outpatient f/u.

## 2013-04-03 ENCOUNTER — Encounter: Payer: Self-pay | Admitting: *Deleted

## 2013-04-15 ENCOUNTER — Other Ambulatory Visit: Payer: Self-pay | Admitting: Family Medicine

## 2013-04-21 ENCOUNTER — Other Ambulatory Visit: Payer: Self-pay | Admitting: Family Medicine

## 2013-06-02 ENCOUNTER — Other Ambulatory Visit (INDEPENDENT_AMBULATORY_CARE_PROVIDER_SITE_OTHER): Payer: Medicare Other

## 2013-06-02 DIAGNOSIS — E119 Type 2 diabetes mellitus without complications: Secondary | ICD-10-CM

## 2013-06-02 LAB — HEMOGLOBIN A1C: Hgb A1c MFr Bld: 7.3 % — ABNORMAL HIGH (ref 4.6–6.5)

## 2013-06-09 ENCOUNTER — Ambulatory Visit (INDEPENDENT_AMBULATORY_CARE_PROVIDER_SITE_OTHER): Payer: Medicare Other | Admitting: Family Medicine

## 2013-06-09 ENCOUNTER — Encounter: Payer: Self-pay | Admitting: Family Medicine

## 2013-06-09 VITALS — BP 114/60 | HR 70 | Temp 98.1°F | Wt 167.5 lb

## 2013-06-09 DIAGNOSIS — E119 Type 2 diabetes mellitus without complications: Secondary | ICD-10-CM

## 2013-06-09 DIAGNOSIS — Z23 Encounter for immunization: Secondary | ICD-10-CM

## 2013-06-09 NOTE — Patient Instructions (Addendum)
Call about an eye appointment.  Recheck in 6 months at a physical.  Take care.  Glad to see you.

## 2013-06-09 NOTE — Progress Notes (Signed)
Diabetes:  Using medications without difficulties:yes Hypoglycemic episodes:very rare, only if prolonged fasting Hyperglycemic episodes:no Feet problems:no Blood Sugars averaging: ~100-160 eye exam within last year:due, he'll call about that He feels well.   Meds, vitals, and allergies reviewed.   ROS: See HPI.  Otherwise negative.    GEN: nad, alert and oriented HEENT: mucous membranes moist NECK: supple w/o LA CV: rrr. PULM: ctab, no inc wob ABD: soft, +bs EXT: no edema SKIN: no acute rash  Diabetic foot exam: Normal inspection No skin breakdown No calluses  Normal DP pulses Normal sensation to light touch and monofilament Nails normal

## 2013-06-09 NOTE — Assessment & Plan Note (Signed)
Reasonable control, continue current meds.  Advised to avoid prolonged fasting. Recheck in about 6 months.  He agrees. Labs dw pt.

## 2013-06-14 ENCOUNTER — Other Ambulatory Visit: Payer: Self-pay | Admitting: Family Medicine

## 2013-06-26 ENCOUNTER — Other Ambulatory Visit: Payer: Self-pay | Admitting: Family Medicine

## 2013-06-29 ENCOUNTER — Other Ambulatory Visit: Payer: Self-pay | Admitting: Family Medicine

## 2013-06-29 ENCOUNTER — Other Ambulatory Visit: Payer: Self-pay | Admitting: *Deleted

## 2013-06-29 MED ORDER — BD SWAB SINGLE USE REGULAR PADS: MEDICATED_PAD | Status: DC

## 2013-06-30 ENCOUNTER — Other Ambulatory Visit: Payer: Self-pay

## 2013-06-30 MED ORDER — GLUCOSE BLOOD VI STRP: ORAL_STRIP | Status: DC

## 2013-06-30 MED ORDER — BD SWAB SINGLE USE REGULAR PADS: MEDICATED_PAD | Status: DC

## 2013-06-30 NOTE — Telephone Encounter (Signed)
Aaron Becker from [facility] needs diagnosis code for swabs and test strips; rx sent as before but Dx 250.00 added.

## 2013-07-13 ENCOUNTER — Other Ambulatory Visit: Payer: Self-pay | Admitting: Family Medicine

## 2013-07-13 NOTE — Telephone Encounter (Signed)
Okay, sent. Thanks.

## 2013-07-13 NOTE — Telephone Encounter (Signed)
Received refill request electronically. Last office visit 06/09/13. See allergy/contraindication. Is it okay to refill medication?

## 2013-09-24 DIAGNOSIS — S2231XA Fracture of one rib, right side, initial encounter for closed fracture: Secondary | ICD-10-CM

## 2013-09-24 HISTORY — DX: Fracture of one rib, right side, initial encounter for closed fracture: S22.31XA

## 2013-10-18 ENCOUNTER — Other Ambulatory Visit: Payer: Self-pay | Admitting: Family Medicine

## 2013-10-21 ENCOUNTER — Other Ambulatory Visit: Payer: Self-pay | Admitting: Family Medicine

## 2013-11-05 ENCOUNTER — Telehealth: Payer: Self-pay | Admitting: *Deleted

## 2013-11-05 MED ORDER — PRAVASTATIN SODIUM 80 MG PO TABS: 80.0000 mg | ORAL_TABLET | Freq: Every day | ORAL | Status: DC

## 2013-11-05 NOTE — Telephone Encounter (Signed)
Change to 80mg , rx sent.  Thanks.

## 2013-11-05 NOTE — Telephone Encounter (Signed)
Pt brought in a prior auth request for pravastatin 40 mg. Per letter pravastatin is covered, but current dosing (40 mg 2 tabs qhs) quantity # 60 per 30 days exceeds their limitations at that dose. Max quantity allowed is 45 tabs per 30 days. Ins has pravastatin 80 mg listed in formulary as being covered #30 per 30 days. Is an increase in strength to 80 mg daily appropriate, or do you want to iniate a quantity limit over-ride prior auth on this pt?

## 2013-11-30 ENCOUNTER — Other Ambulatory Visit: Payer: Self-pay | Admitting: Family Medicine

## 2013-11-30 DIAGNOSIS — E119 Type 2 diabetes mellitus without complications: Secondary | ICD-10-CM

## 2013-12-03 ENCOUNTER — Other Ambulatory Visit (INDEPENDENT_AMBULATORY_CARE_PROVIDER_SITE_OTHER): Payer: Medicare Other

## 2013-12-03 DIAGNOSIS — I10 Essential (primary) hypertension: Secondary | ICD-10-CM

## 2013-12-03 DIAGNOSIS — E039 Hypothyroidism, unspecified: Secondary | ICD-10-CM

## 2013-12-03 DIAGNOSIS — E7800 Pure hypercholesterolemia, unspecified: Secondary | ICD-10-CM

## 2013-12-03 DIAGNOSIS — E119 Type 2 diabetes mellitus without complications: Secondary | ICD-10-CM

## 2013-12-03 LAB — COMPREHENSIVE METABOLIC PANEL
ALT: 27 U/L (ref 0–53)
AST: 25 U/L (ref 0–37)
Albumin: 4.1 g/dL (ref 3.5–5.2)
Alkaline Phosphatase: 45 U/L (ref 39–117)
BILIRUBIN TOTAL: 0.9 mg/dL (ref 0.3–1.2)
BUN: 23 mg/dL (ref 6–23)
CO2: 29 meq/L (ref 19–32)
CREATININE: 1.3 mg/dL (ref 0.4–1.5)
Calcium: 9.4 mg/dL (ref 8.4–10.5)
Chloride: 104 mEq/L (ref 96–112)
GFR: 55.27 mL/min — AB (ref >60.00)
Glucose, Bld: 166 mg/dL — ABNORMAL HIGH (ref 70–99)
Potassium: 4.3 mEq/L (ref 3.5–5.1)
Sodium: 139 mEq/L (ref 135–145)
Total Protein: 7.1 g/dL (ref 6.0–8.3)

## 2013-12-03 LAB — LIPID PANEL
Cholesterol: 109 mg/dL (ref 0–200)
HDL: 50.8 mg/dL (ref >39.00)
LDL Cholesterol: 46 mg/dL (ref 0–99)
Total CHOL/HDL Ratio: 2
Triglycerides: 63 mg/dL (ref 0.0–149.0)
VLDL: 12.6 mg/dL (ref 0.0–40.0)

## 2013-12-03 LAB — TSH: TSH: 0.35 u[IU]/mL (ref 0.35–5.50)

## 2013-12-03 LAB — HEMOGLOBIN A1C: Hgb A1c MFr Bld: 8 % — ABNORMAL HIGH (ref 4.6–6.5)

## 2013-12-10 ENCOUNTER — Encounter: Payer: Self-pay | Admitting: Family Medicine

## 2013-12-10 ENCOUNTER — Ambulatory Visit (INDEPENDENT_AMBULATORY_CARE_PROVIDER_SITE_OTHER): Payer: Medicare Other | Admitting: Family Medicine

## 2013-12-10 VITALS — BP 142/80 | HR 62 | Temp 97.6°F | Ht 67.0 in | Wt 169.5 lb

## 2013-12-10 DIAGNOSIS — E039 Hypothyroidism, unspecified: Secondary | ICD-10-CM

## 2013-12-10 DIAGNOSIS — I10 Essential (primary) hypertension: Secondary | ICD-10-CM

## 2013-12-10 DIAGNOSIS — E119 Type 2 diabetes mellitus without complications: Secondary | ICD-10-CM

## 2013-12-10 DIAGNOSIS — Z0000 Encounter for general adult medical examination without abnormal findings: Secondary | ICD-10-CM

## 2013-12-10 DIAGNOSIS — E7800 Pure hypercholesterolemia, unspecified: Secondary | ICD-10-CM

## 2013-12-10 NOTE — Assessment & Plan Note (Signed)
See scanned forms.  Routine anticipatory guidance given to patient.  See health maintenance. Flu 2014 Shingles 2013 PNA 2001 Tetanus 2010 Colonoscopy 2012 PSA per urology. D/w pt.  Advance directive- d/w pt.  Would have daughter and son equally designated if patient is incapacitated.  Cognitive function addressed- see scanned forms- and if abnormal then additional documentation follows.

## 2013-12-10 NOTE — Assessment & Plan Note (Signed)
Controlled, continue current meds.  Labs d/w pt. He agrees.  

## 2013-12-10 NOTE — Assessment & Plan Note (Signed)
Controlled, continue current meds.  Labs d/w pt. He agrees.

## 2013-12-10 NOTE — Progress Notes (Signed)
Pre visit review using our clinic review tool, if applicable. No additional management support is needed unless otherwise documented below in the visit note.  I have personally reviewed the Medicare Annual Wellness questionnaire and have noted 1. The patient's medical and social history 2. Their use of alcohol, tobacco or illicit drugs 3. Their current medications and supplements 4. The patient's functional ability including ADL's, fall risks, home safety risks and hearing or visual             impairment. 5. Diet and physical activities 6. Evidence for depression or mood disorders  The patients weight, height, BMI have been recorded in the chart and visual acuity is per eye clinic.  I have made referrals, counseling and provided education to the patient based review of the above and I have provided the pt with a written personalized care plan for preventive services.  See scanned forms.  Routine anticipatory guidance given to patient.  See health maintenance. Flu 2014 Shingles 2013 PNA 2001 Tetanus 2010 Colonoscopy 2012 PSA per urology. D/w pt.  Advance directive- d/w pt.  Would have daughter and son equally designated if patient is incapacitated.  Cognitive function addressed- see scanned forms- and if abnormal then additional documentation follows.   Hypothyroid. No ADE on meds.  TSH wnl.  Compliant.  No dysphagia.  No neck mass.   Diabetes:  Using medications without difficulties:yes Hypoglycemic episodes:rare lows Hyperglycemic episodes:no Feet problems:no Blood Sugars averaging: ~100 eye exam within last year:yes  Elevated Cholesterol: Using medications without problems:yes Muscle aches: no Diet compliance:yes Exercise:yes  Hypertension:    Using medication without problems or lightheadedness: yes Chest pain with exertion:no Edema:no Short of breath:no  PMH and SH reviewed  Meds, vitals, and allergies reviewed.   ROS: See HPI.  Otherwise negative.    GEN:  nad, alert and oriented HEENT: mucous membranes moist NECK: supple w/o LA CV: rrr. PULM: ctab, no inc wob ABD: soft, +bs EXT: no edema SKIN: no acute rash  Diabetic foot exam: Normal inspection No skin breakdown No calluses  Normal DP pulses Normal sensation to light touch and monofilament Nails normal

## 2013-12-10 NOTE — Assessment & Plan Note (Signed)
Reasonably controlled, continue current meds.  Labs d/w pt. He agrees.  Goal A1c ~8 to prevent hypoglycemia.

## 2013-12-10 NOTE — Assessment & Plan Note (Signed)
Reasonably controlled, continue current meds.  Labs d/w pt. He agrees.

## 2013-12-10 NOTE — Patient Instructions (Signed)
Don't change your meds.  Take care. Glad to see you.   If you have more low blood sugars, then let me know.  Recheck in about 6 months.  Labs ahead of time.

## 2013-12-18 ENCOUNTER — Telehealth: Payer: Self-pay

## 2013-12-18 NOTE — Telephone Encounter (Signed)
Relevant patient education mailed to patient.  

## 2014-01-01 ENCOUNTER — Other Ambulatory Visit: Payer: Self-pay | Admitting: *Deleted

## 2014-01-01 ENCOUNTER — Other Ambulatory Visit: Payer: Self-pay | Admitting: Family Medicine

## 2014-01-01 MED ORDER — BD SWAB SINGLE USE REGULAR PADS: MEDICATED_PAD | Status: DC

## 2014-01-01 MED ORDER — GLUCOSE BLOOD VI STRP: ORAL_STRIP | Status: DC

## 2014-01-19 ENCOUNTER — Other Ambulatory Visit: Payer: Self-pay | Admitting: Family Medicine

## 2014-02-24 ENCOUNTER — Telehealth: Payer: Self-pay | Admitting: Family Medicine

## 2014-02-24 NOTE — Telephone Encounter (Signed)
Patient's urologist,Dr.Cope, is moving to [place].  They can't see patient before they move.  Patient is asking for you to recommend a urologist for him to see in [place].

## 2014-02-24 NOTE — Telephone Encounter (Signed)
[name]- who else is in [place]? Thanks.

## 2014-03-03 NOTE — Telephone Encounter (Signed)
Thanks

## 2014-03-03 NOTE — Telephone Encounter (Signed)
Spoke with patients friend and they decided to stay with Dr Jacqlyn Larsen and they are trying to get him seen soon to check his psa.

## 2014-04-15 ENCOUNTER — Other Ambulatory Visit: Payer: Self-pay | Admitting: Family Medicine

## 2014-06-03 ENCOUNTER — Telehealth: Payer: Self-pay

## 2014-06-03 DIAGNOSIS — Z125 Encounter for screening for malignant neoplasm of prostate: Secondary | ICD-10-CM

## 2014-06-03 NOTE — Telephone Encounter (Signed)
V/M was left requesting PSA lab test to be added to lab testing already scheduled on 06/07/14. Pt has not been able to see Dr Jacqlyn Larsen and has not had PSA cked in 2 years.Please advise.

## 2014-06-04 NOTE — Telephone Encounter (Signed)
Added

## 2014-06-06 ENCOUNTER — Other Ambulatory Visit: Payer: Self-pay | Admitting: Family Medicine

## 2014-06-06 DIAGNOSIS — E119 Type 2 diabetes mellitus without complications: Secondary | ICD-10-CM

## 2014-06-07 ENCOUNTER — Other Ambulatory Visit (INDEPENDENT_AMBULATORY_CARE_PROVIDER_SITE_OTHER): Payer: Medicare Other

## 2014-06-07 DIAGNOSIS — E119 Type 2 diabetes mellitus without complications: Secondary | ICD-10-CM

## 2014-06-07 DIAGNOSIS — Z125 Encounter for screening for malignant neoplasm of prostate: Secondary | ICD-10-CM

## 2014-06-07 LAB — PSA, MEDICARE: PSA: 5.9 ng/mL — AB (ref 0.10–4.00)

## 2014-06-07 LAB — HEMOGLOBIN A1C: Hgb A1c MFr Bld: 7.9 % — ABNORMAL HIGH (ref 4.6–6.5)

## 2014-06-14 ENCOUNTER — Encounter: Payer: Self-pay | Admitting: Family Medicine

## 2014-06-14 ENCOUNTER — Ambulatory Visit (INDEPENDENT_AMBULATORY_CARE_PROVIDER_SITE_OTHER): Payer: Medicare Other | Admitting: Family Medicine

## 2014-06-14 VITALS — BP 140/70 | HR 64 | Temp 98.4°F | Wt 169.5 lb

## 2014-06-14 DIAGNOSIS — Z23 Encounter for immunization: Secondary | ICD-10-CM

## 2014-06-14 DIAGNOSIS — E119 Type 2 diabetes mellitus without complications: Secondary | ICD-10-CM

## 2014-06-14 DIAGNOSIS — R972 Elevated prostate specific antigen [PSA]: Secondary | ICD-10-CM

## 2014-06-14 NOTE — Progress Notes (Signed)
Pre visit review using our clinic review tool, if applicable. No additional management support is needed unless otherwise documented below in the visit note.  Diabetes:  Using medications without difficulties:yes.  He can taper his dose of medicine if sugar is low and not eating a large meal.   Hypoglycemic episodes:only if very light oral intake.  Hyperglycemic episodes:no Feet problems:no Blood Sugars averaging: usually ~140 before bed.  Usually ~130 in the AM.   eye exam within last year:due, f/u next month pending.  A1c stable, d/w pt.  Discussed goal A1c ~8.   PSA elevation.  Had seen Dr. Ayesha Rumpf.  H/o neg bx in 2010, back when his PSA was ~5.  Stream is still good.  No fevers.  No pain.  D/w pt about options.  Will re-refer back to Dr. Jacqlyn Larsen.  DRE deferred since he is going to f/u with uro.   PMH and SH reviewed  Meds, vitals, and allergies reviewed.   ROS: See HPI.  Otherwise negative.    GEN: nad, alert and oriented HEENT: mucous membranes moist NECK: supple w/o LA CV: rrr. PULM: ctab, no inc wob ABD: soft, +bs EXT: no edema SKIN: no acute rash  Diabetic foot exam: Normal inspection No skin breakdown No calluses  Normal DP pulses Normal sensation to light touch and monofilament Nails normal

## 2014-06-14 NOTE — Patient Instructions (Addendum)
Don't change your meds.  [name] will call about your referral. Recheck in about 6 months at a physical.  Labs ahead of time.  Take care.  Glad to see you.

## 2014-06-15 NOTE — Assessment & Plan Note (Signed)
Had seen Dr. Ayesha Rumpf.  H/o neg bx in 2010, back when his PSA was ~5.  Stream is still good.  No fevers.  No pain.  D/w pt about options.  Will re-refer back to Dr. Jacqlyn Larsen.  DRE deferred since he is going to f/u with uro.

## 2014-06-15 NOTE — Assessment & Plan Note (Signed)
A1c goal ~8, wouldn't change meds at this point.  He agrees.  Continue diet, recheck in about 6 months.  He'll call for eye exam.  Labs d/w pt.

## 2014-06-23 ENCOUNTER — Other Ambulatory Visit: Payer: Self-pay | Admitting: *Deleted

## 2014-06-23 MED ORDER — ONETOUCH BASIC SYSTEM W/DEVICE KIT: PACK | Status: DC

## 2014-06-23 NOTE — Telephone Encounter (Signed)
Average once a day if needed.

## 2014-06-23 NOTE — Telephone Encounter (Signed)
Received faxed refill request from pharmacy for a new one touch device/kit. Was advised that patient's current machine is not working. Please advise the number of times patient should be checking his blood sugar.

## 2014-06-23 NOTE — Telephone Encounter (Signed)
Rx sent to pharmacy as instructed 

## 2014-07-07 DIAGNOSIS — N529 Male erectile dysfunction, unspecified: Secondary | ICD-10-CM | POA: Insufficient documentation

## 2014-07-07 DIAGNOSIS — R339 Retention of urine, unspecified: Secondary | ICD-10-CM | POA: Insufficient documentation

## 2014-07-07 DIAGNOSIS — N138 Other obstructive and reflux uropathy: Secondary | ICD-10-CM | POA: Insufficient documentation

## 2014-07-07 DIAGNOSIS — N401 Enlarged prostate with lower urinary tract symptoms: Secondary | ICD-10-CM

## 2014-07-09 ENCOUNTER — Observation Stay: Payer: Self-pay | Admitting: Surgery

## 2014-07-09 LAB — CBC WITH DIFFERENTIAL/PLATELET
BASOS PCT: 0.2 %
Basophil #: 0 10*3/uL (ref 0.0–0.1)
EOS ABS: 0 10*3/uL (ref 0.0–0.7)
EOS PCT: 0 %
HCT: 40.5 % (ref 40.0–52.0)
HGB: 12.9 g/dL — ABNORMAL LOW (ref 13.0–18.0)
LYMPHS ABS: 0.3 10*3/uL — AB (ref 1.0–3.6)
LYMPHS PCT: 1.6 %
MCH: 29.6 pg (ref 26.0–34.0)
MCHC: 32 g/dL (ref 32.0–36.0)
MCV: 93 fL (ref 80–100)
Monocyte #: 1.1 x10 3/mm — ABNORMAL HIGH (ref 0.2–1.0)
Monocyte %: 6.4 %
NEUTROS ABS: 15.9 10*3/uL — AB (ref 1.4–6.5)
Neutrophil %: 91.8 %
PLATELETS: 180 10*3/uL (ref 150–440)
RBC: 4.36 10*6/uL — AB (ref 4.40–5.90)
RDW: 13.7 % (ref 11.5–14.5)
WBC: 17.4 10*3/uL — ABNORMAL HIGH (ref 3.8–10.6)

## 2014-07-09 LAB — APTT: Activated PTT: 26.1 secs (ref 23.6–35.9)

## 2014-07-09 LAB — URINALYSIS, COMPLETE
Bacteria: NONE SEEN
Bilirubin,UR: NEGATIVE
Glucose,UR: >=500 mg/dL (ref 0–75)
LEUKOCYTE ESTERASE: NEGATIVE
Nitrite: NEGATIVE
Ph: 5 (ref 4.5–8.0)
Protein: 100
RBC,UR: <1 /HPF (ref 0–5)
Specific Gravity: 1.022 (ref 1.003–1.030)
Squamous Epithelial: NONE SEEN

## 2014-07-09 LAB — BASIC METABOLIC PANEL
Anion Gap: 13 (ref 7–16)
BUN: 22 mg/dL — AB (ref 7–18)
CALCIUM: 8.8 mg/dL (ref 8.5–10.1)
Chloride: 104 mmol/L (ref 98–107)
Co2: 23 mmol/L (ref 21–32)
Creatinine: 1.45 mg/dL — ABNORMAL HIGH (ref 0.60–1.30)
EGFR (African American): >60
EGFR (Non-African Amer.): 50 — ABNORMAL LOW
Glucose: 295 mg/dL — ABNORMAL HIGH (ref 65–99)
OSMOLALITY: 294 (ref 275–301)
Potassium: 4.1 mmol/L (ref 3.5–5.1)
Sodium: 140 mmol/L (ref 136–145)

## 2014-07-09 LAB — TROPONIN I

## 2014-07-09 LAB — PROTIME-INR
INR: 1
Prothrombin Time: 13.1 secs (ref 11.5–14.7)

## 2014-07-10 LAB — BASIC METABOLIC PANEL
ANION GAP: 8 (ref 7–16)
BUN: 20 mg/dL — ABNORMAL HIGH (ref 7–18)
CALCIUM: 8 mg/dL — AB (ref 8.5–10.1)
CHLORIDE: 105 mmol/L (ref 98–107)
Co2: 25 mmol/L (ref 21–32)
Creatinine: 1.34 mg/dL — ABNORMAL HIGH (ref 0.60–1.30)
EGFR (Non-African Amer.): 54 — ABNORMAL LOW
Glucose: 215 mg/dL — ABNORMAL HIGH (ref 65–99)
OSMOLALITY: 285 (ref 275–301)
POTASSIUM: 4.1 mmol/L (ref 3.5–5.1)
Sodium: 138 mmol/L (ref 136–145)

## 2014-07-10 LAB — CBC WITH DIFFERENTIAL/PLATELET
Basophil #: 0 10*3/uL (ref 0.0–0.1)
Basophil %: 0.3 %
EOS PCT: 0 %
Eosinophil #: 0 10*3/uL (ref 0.0–0.7)
HCT: 32.4 % — ABNORMAL LOW (ref 40.0–52.0)
HGB: 10.6 g/dL — ABNORMAL LOW (ref 13.0–18.0)
LYMPHS PCT: 5.8 %
Lymphocyte #: 0.7 10*3/uL — ABNORMAL LOW (ref 1.0–3.6)
MCH: 30 pg (ref 26.0–34.0)
MCHC: 32.8 g/dL (ref 32.0–36.0)
MCV: 92 fL (ref 80–100)
MONOS PCT: 11.1 %
Monocyte #: 1.3 x10 3/mm — ABNORMAL HIGH (ref 0.2–1.0)
Neutrophil #: 9.7 10*3/uL — ABNORMAL HIGH (ref 1.4–6.5)
Neutrophil %: 82.8 %
Platelet: 178 10*3/uL (ref 150–440)
RBC: 3.53 10*6/uL — AB (ref 4.40–5.90)
RDW: 13.6 % (ref 11.5–14.5)
WBC: 11.7 10*3/uL — ABNORMAL HIGH (ref 3.8–10.6)

## 2014-07-11 ENCOUNTER — Other Ambulatory Visit: Payer: Self-pay | Admitting: Family Medicine

## 2014-07-11 DIAGNOSIS — I351 Nonrheumatic aortic (valve) insufficiency: Secondary | ICD-10-CM

## 2014-07-12 NOTE — Telephone Encounter (Signed)
Received refill request electronically from pharmacy. See allergy/contraindication. Is it okay to refill medication? 

## 2014-07-13 NOTE — Telephone Encounter (Signed)
Has tolerated, okay to continue.  Thanks.

## 2014-07-14 ENCOUNTER — Encounter: Payer: Self-pay | Admitting: Family Medicine

## 2014-07-14 DIAGNOSIS — R55 Syncope and collapse: Secondary | ICD-10-CM | POA: Insufficient documentation

## 2014-07-15 ENCOUNTER — Observation Stay: Payer: Self-pay | Admitting: Internal Medicine

## 2014-07-15 LAB — CBC WITH DIFFERENTIAL/PLATELET
BASOS ABS: 0 10*3/uL (ref 0.0–0.1)
Basophil %: 0.1 %
EOS ABS: 0 10*3/uL (ref 0.0–0.7)
Eosinophil %: 0.3 %
HCT: 35.7 % — ABNORMAL LOW (ref 40.0–52.0)
HGB: 11.8 g/dL — AB (ref 13.0–18.0)
Lymphocyte #: 0.9 10*3/uL — ABNORMAL LOW (ref 1.0–3.6)
Lymphocyte %: 6.4 %
MCH: 29.9 pg (ref 26.0–34.0)
MCHC: 33 g/dL (ref 32.0–36.0)
MCV: 91 fL (ref 80–100)
MONOS PCT: 11 %
Monocyte #: 1.5 x10 3/mm — ABNORMAL HIGH (ref 0.2–1.0)
NEUTROS ABS: 11 10*3/uL — AB (ref 1.4–6.5)
Neutrophil %: 82.2 %
PLATELETS: 337 10*3/uL (ref 150–440)
RBC: 3.94 10*6/uL — ABNORMAL LOW (ref 4.40–5.90)
RDW: 13.1 % (ref 11.5–14.5)
WBC: 13.4 10*3/uL — ABNORMAL HIGH (ref 3.8–10.6)

## 2014-07-15 LAB — TROPONIN I
TROPONIN-I: 0.09 ng/mL — AB
TROPONIN-I: 0.08 ng/mL — AB

## 2014-07-15 LAB — BASIC METABOLIC PANEL
Anion Gap: 10 (ref 7–16)
BUN: 59 mg/dL — AB (ref 7–18)
CO2: 26 mmol/L (ref 21–32)
CREATININE: 1.46 mg/dL — AB (ref 0.60–1.30)
Calcium, Total: 8.1 mg/dL — ABNORMAL LOW (ref 8.5–10.1)
Chloride: 95 mmol/L — ABNORMAL LOW (ref 98–107)
EGFR (Non-African Amer.): 49 — ABNORMAL LOW
GFR CALC AF AMER: 60 — AB
GLUCOSE: 202 mg/dL — AB (ref 65–99)
Osmolality: 285 (ref 275–301)
Potassium: 3.3 mmol/L — ABNORMAL LOW (ref 3.5–5.1)
Sodium: 131 mmol/L — ABNORMAL LOW (ref 136–145)

## 2014-07-16 ENCOUNTER — Other Ambulatory Visit: Payer: Self-pay | Admitting: Physician Assistant

## 2014-07-16 DIAGNOSIS — R002 Palpitations: Secondary | ICD-10-CM

## 2014-07-16 LAB — CBC WITH DIFFERENTIAL/PLATELET
Basophil #: 0 10*3/uL (ref 0.0–0.1)
Basophil %: 0.3 %
EOS PCT: 1 %
Eosinophil #: 0.1 10*3/uL (ref 0.0–0.7)
HCT: 29.5 % — AB (ref 40.0–52.0)
HGB: 9.6 g/dL — ABNORMAL LOW (ref 13.0–18.0)
Lymphocyte #: 0.9 10*3/uL — ABNORMAL LOW (ref 1.0–3.6)
Lymphocyte %: 9.8 %
MCH: 29.7 pg (ref 26.0–34.0)
MCHC: 32.7 g/dL (ref 32.0–36.0)
MCV: 91 fL (ref 80–100)
MONO ABS: 1 x10 3/mm (ref 0.2–1.0)
MONOS PCT: 11 %
NEUTROS ABS: 7.4 10*3/uL — AB (ref 1.4–6.5)
NEUTROS PCT: 77.9 %
PLATELETS: 246 10*3/uL (ref 150–440)
RBC: 3.24 10*6/uL — ABNORMAL LOW (ref 4.40–5.90)
RDW: 13.2 % (ref 11.5–14.5)
WBC: 9.4 10*3/uL (ref 3.8–10.6)

## 2014-07-16 LAB — LIPID PANEL
Cholesterol: 95 mg/dL (ref 0–200)
HDL: 29 mg/dL — AB (ref 40–60)
LDL CHOLESTEROL, CALC: 37 mg/dL (ref 0–100)
Triglycerides: 146 mg/dL (ref 0–200)
VLDL CHOLESTEROL, CALC: 29 mg/dL (ref 5–40)

## 2014-07-16 LAB — BASIC METABOLIC PANEL
ANION GAP: 6 — AB (ref 7–16)
BUN: 49 mg/dL — ABNORMAL HIGH (ref 7–18)
Calcium, Total: 7.3 mg/dL — ABNORMAL LOW (ref 8.5–10.1)
Chloride: 99 mmol/L (ref 98–107)
Co2: 27 mmol/L (ref 21–32)
Creatinine: 1.27 mg/dL (ref 0.60–1.30)
GFR CALC NON AF AMER: 58 — AB
Glucose: 171 mg/dL — ABNORMAL HIGH (ref 65–99)
Osmolality: 282 (ref 275–301)
POTASSIUM: 3.6 mmol/L (ref 3.5–5.1)
Sodium: 132 mmol/L — ABNORMAL LOW (ref 136–145)

## 2014-07-16 LAB — TSH: THYROID STIMULATING HORM: 0.805 u[IU]/mL

## 2014-07-16 LAB — TROPONIN I: Troponin-I: 0.07 ng/mL — ABNORMAL HIGH

## 2014-07-16 NOTE — Progress Notes (Signed)
Patient seen in consult while in the hospital. Please schedule for 30 day monitor.

## 2014-07-18 ENCOUNTER — Encounter: Payer: Self-pay | Admitting: Family Medicine

## 2014-07-18 DIAGNOSIS — I471 Supraventricular tachycardia: Secondary | ICD-10-CM | POA: Insufficient documentation

## 2014-07-19 ENCOUNTER — Telehealth: Payer: Self-pay | Admitting: Family Medicine

## 2014-07-19 ENCOUNTER — Other Ambulatory Visit: Payer: Self-pay | Admitting: Family Medicine

## 2014-07-19 NOTE — Telephone Encounter (Signed)
Pt would like to make sure that we are aware that only his diabetic supplies go to [place], the [facility] [address] in [place], [place]

## 2014-07-20 MED ORDER — GLUCOSE BLOOD VI STRP: ORAL_STRIP | Status: DC

## 2014-07-20 NOTE — Telephone Encounter (Signed)
Note put under comments regarding diabetic supplies.

## 2014-07-20 NOTE — Addendum Note (Signed)
Addended by: Marchia Bond on: 07/20/2014 10:18 AM   Modules accepted: Orders

## 2014-07-20 NOTE — Telephone Encounter (Signed)
Spoke to [place] and was advised that there has not been a problem with a script going to the wrong pharmacy. Joaquim Lai stated that [facility] sent a refill request over for patient's test strips and they just need a new code on the script. Per Aniceto Boss this has been corrected and sent back to the pharmacy.

## 2014-07-20 NOTE — Telephone Encounter (Signed)
Noted, is there anything that needs to be sent or changed now? Is there a way to flag this on the chart to make it obvious on future encounters?

## 2014-07-22 ENCOUNTER — Encounter: Payer: Self-pay | Admitting: Physician Assistant

## 2014-07-22 ENCOUNTER — Telehealth: Payer: Self-pay | Admitting: Cardiovascular Disease

## 2014-07-22 NOTE — Telephone Encounter (Signed)
Intern health care nurse calling letting us know pt has not received his monitor yet. Wanted [place] to know, if you have questions please call ms kathy.

## 2014-07-22 NOTE — Telephone Encounter (Signed)
Informed patient that event monitor has been ordered  Instructed them to call if they did not receive holter by next week

## 2014-07-22 NOTE — Progress Notes (Signed)
Patient Name: Aaron Becker, Aaron Becker 06-21-1930, MRN [id number]  Date of Encounter: 07/23/2014  Primary Care Provider:  Elsie Stain, MD Primary Cardiologist:  Dr. Rockey Situ, MD  Patient Profile:  80 y.o. male with history below who presents for hospital follow up after 2 recent admissions (10/16-10/20 & 10/22-10/23) for a syncopal episode that occurred while standing on a ladder unfortunately causing him to sustain multiple broken ribs and a hemopneumothorax.     Problem List:   Past Medical History  Diagnosis Date  . Diabetes mellitus, type II 1986  . Hypertension 02/02  . Hypothyroidism 1980's  . Elevated PSA 2011    per Dr. Jacqlyn Larsen with Uro  . Hyperlipidemia   . Arrhythmia     a. reported SVT during admission at [facility] 06/2014 (no documentation)   . Syncope and collapse   . Palpitations   . SVT (supraventricular tachycardia)   . Right rib fracture    Past Surgical History  Procedure Laterality Date  . Cholecystectomy  1967  . Back surgery  last 1978    x 3  . Varicose vein surgery  1975  . Vasectomy  1975  . Carotid ultrasound  02/02    wnl  . Cystectomy  06/14/04    mucous cyst excision with left thumb, IP joint debridement  . Prostate biopsy  11/01/08    Dr. Reece Agar     Allergies:  Allergies  Allergen Reactions  . Rosuvastatin     REACTION: muscle stiffness  . Sulfonamide Derivatives     REACTION: unspecified reaction     HPI:  80 y.o. male with the above problem list who presents to the office today for hospital follow up.   Patient with no known prior cardiac history including ischemic evaluation or arrhythmia evaluations. He is an active guy at home at baseline prior to his above syncopal episode while on the ladder. When questioning him about this fall he states he was in his usual state of health that AM. He went out to breakfast like he always does, came home was originally going to [facility] the lawn but it was too wet so he decided to trim a few branches  back. He climbed the ladder, felt fine. The next thing he knew he was on the ground. He denies feeling any chest pain, palpitations, SOB, diaphoresis, nausea, vomiting, or presyncope. While he was admitted prior notes indicate no arrhythmia events. Echo done while inpatient showed EF 60-65%, mildly impaired LV relaxation of diastolic filling, mild aortic stenosis. Cardiology was not consulted during that admission.   He presented to [facility] on the evening of 10/22 with SOB, palpitations, and complaints of an elevated HR all day. In the ED his HR was found to be in the 160s. He did not receive any adenosine. He was started on metoprolol 25 mg bid. With good pulse response. He denied any chest pain (outside of rib fractures). No increased dyspnea. No nausea, vomiting, presyncope, or syncope. His daughter indicates he has not been drinkning as much water as he should have been. EKG showed sinus tach 108 with frequent PACs. Nonspecific st/t changes. Labs showed SCr 1.34 - 1.46 - 1.27, K+ 4.1 - 3.3 - 3.6, TnI 0.09 - 0.08 - 0.07, WBC 11.7 - 13.4 - 9.4, CXR showed Small bilateral pleural effusions and bibasilar atelectasis. Ventilation improved since chest radiographs on 07/12/14. It was recommended that he continue the metoprolol 25 mg bid and pursue an outpatient monitor.   Today he comes  in stating he is beginning to feel more like himself. He has not yet started to wear the CardioNet monitor as they were unsure if we still wanted this. He is still taking the Lopressor 25 mg bid without issues. He has not had any further syncopal episodes or palpitations. His blood pressures have been stable at home. His blood sugars have been elevated, running mostly in the 200s, with a max of 333. He does feel "shakey" at times which resolves with eating candy. His A1C was checked on 06/07/2014 and was 7.9% - 7 months prior A1C was 8.0%. He is currently on glyburide-metformin 2.5/500 mg. He has a follow up appointment with his PCP on  07/26/2014.    Home Medications:  Prior to Admission medications   Medication Sig Start Date End Date Taking? Authorizing Provider  Alcohol Swabs (B-D SINGLE USE SWABS REGULAR) PADS Use as instructed to test blood sugar once daily.  Diagnosis:  250.00  Non-insulin dependent. 01/01/14   Tonia Ghent, MD  benazepril (LOTENSIN) 10 MG tablet TAKE 1 TABLET BY MOUTH EVERY DAY 01/19/14   Tonia Ghent, MD  Blood Glucose Monitoring Suppl ([place]) W/DEVICE KIT Check blood sugar daily. DX 250.00 06/23/14   Tonia Ghent, MD  diphenhydrAMINE (BENADRYL) 25 mg capsule Take 25 mg by mouth daily.    Historical Provider, MD  folic acid (FOLVITE) 258 MCG tablet Take 400 mcg by mouth daily.      Historical Provider, MD  Glucos-Chondroit-Hyaluron-D3 (TRIGOSAMINE MAX ST PO) Take 2 by mouth daily     Historical Provider, MD  glucose blood (ONE TOUCH ULTRA TEST) test strip USE AS DIRECTED TO TEST BLOOD SUGAR ONCE DAILY. Dx. E11.9 07/20/14   Tonia Ghent, MD  glyBURIDE-metformin (GLUCOVANCE) 2.5-500 MG per tablet TAKE 1 TABLET BY MOUTH EVERY MORNING, 2 TABLETS AT SUPPER AND 1/2 AT BEDTIME 07/13/14   Tonia Ghent, MD  hydrochlorothiazide (MICROZIDE) 12.5 MG capsule TAKE ONE CAPSULE BY MOUTH EVERY DAY 01/19/14   Tonia Ghent, MD  ibuprofen (ADVIL,MOTRIN) 200 MG tablet Take 200 mg by mouth every 6 (six) hours as needed for pain.    Historical Provider, MD  levothyroxine (SYNTHROID, LEVOTHROID) 75 MCG tablet TAKE 1 TABLET BY MOUTH EVERY DAY 04/15/14   Tonia Ghent, MD  Menthol, Topical Analgesic, (ZIMS MAX-FREEZE) 3.7 % GEL Apply topically daily.      Historical Provider, MD  Multiple Vitamins-Minerals (CENTRUM SILVER PO) Take by mouth daily.      Historical Provider, MD  NON FORMULARY Mucous Relief Expectorant    Historical Provider, MD  Omega-3 Fatty Acids (FISH OIL) 1000 MG CAPS Take by mouth daily.      Historical Provider, MD  pravastatin (PRAVACHOL) 80 MG tablet Take 1 tablet (80 mg  total) by mouth daily. 11/05/13   Tonia Ghent, MD  vitamin C (ASCORBIC ACID) 500 MG tablet Take 500 mg by mouth daily.      Historical Provider, MD     Weights: Filed Weights   07/23/14 1408  Weight: 164 lb 8 oz (74.617 kg)     Review of Systems:  All other systems reviewed and are otherwise negative except as noted above.  Physical Exam:  Blood pressure 126/62, pulse 74, height _0  (1.727 m), weight 164 lb 8 oz (74.617 kg).  General: Pleasant, NAD Psych: Normal affect. Neuro: Alert and oriented X 3. Moves all extremities spontaneously. HEENT: Normal  Neck: Supple without bruits or JVD. Lungs:  Resp regular and unlabored, CTA. Heart: RRR no s3, s4, or murmurs. Abdomen: Soft, non-tender, non-distended, BS + x 4.  Extremities: No clubbing, cyanosis or edema. DP/PT/Radials 2+ and equal bilaterally.   Accessory Clinical Findings:  EKG - NSR, 74, RSR, no st/t changes  Assessment & Plan:  1. Arrhythmia: -Presented to [facility] most recently (10/22) with HR into the 160s. Reportedly SVT though there were no strips available to review documenting this. He 12 lead EKG upon admission showed sinus tach with frequent PACs -Has not yet started CardioNet monitor, plan to start this next week -Continue metoprolol 25 mg bid  2. History of elevated TnI: -Felt to be demand ischemia 2/2 his tachycardia  -Plan for [facility] as he cannot tolerate a treadmill - after cardiac event monitor  -Had an echo during his admission (10/16-10/20) that showed normal EF,  -Echo done prior admission with nl LV function, mild aortic stenosis, diastolic dysfunction  -Continue Coreg, aspirin, statin  3. History of syncopal episode: -It is unclear what truly caused this episode and unfortunately we will unlikely never know -He is on medications that could cause some orthostatic hypotension - may be best to limit these to a minimum, patient to discuss with PCP -Has stopped Flomax -Gentle increase  in fluids to minimize dehydration  4. Mild aortic stenosis: -Continue medical therapy -Repeat echo in 1 year  5. DM2: -Review of blood sugars indicates poorly controlled A1Cs  -Review of patient's home glucose record indicates levels as high as 810 -He certainly may be experiencing these hyperglycemic events then bottoming out with hypoglycemic events as an A1C is an average blood glucose of 180 -He is having to eat sugar at times 2/2 "shaking" which is resolved by eating -Consider adding basal insulin for more even glucose control -Per PCP   Christell Faith, PA-C [facility] [address], [place] [address] [phone] [place] 07/23/2014, 3:20 PM

## 2014-07-23 ENCOUNTER — Ambulatory Visit (INDEPENDENT_AMBULATORY_CARE_PROVIDER_SITE_OTHER): Payer: Medicare Other | Admitting: Physician Assistant

## 2014-07-23 ENCOUNTER — Encounter: Payer: Self-pay | Admitting: Physician Assistant

## 2014-07-23 VITALS — BP 126/62 | HR 74 | Ht 68.0 in | Wt 164.5 lb

## 2014-07-23 DIAGNOSIS — I471 Supraventricular tachycardia: Secondary | ICD-10-CM

## 2014-07-23 DIAGNOSIS — R7989 Other specified abnormal findings of blood chemistry: Secondary | ICD-10-CM

## 2014-07-23 DIAGNOSIS — R778 Other specified abnormalities of plasma proteins: Secondary | ICD-10-CM

## 2014-07-23 DIAGNOSIS — R55 Syncope and collapse: Secondary | ICD-10-CM

## 2014-07-23 DIAGNOSIS — I350 Nonrheumatic aortic (valve) stenosis: Secondary | ICD-10-CM

## 2014-07-23 DIAGNOSIS — E1159 Type 2 diabetes mellitus with other circulatory complications: Secondary | ICD-10-CM

## 2014-07-23 MED ORDER — METOPROLOL TARTRATE 25 MG PO TABS: 25.0000 mg | ORAL_TABLET | Freq: Two times a day (BID) | ORAL | Status: DC

## 2014-07-23 NOTE — Patient Instructions (Signed)
Your Metoprolol has been refilled  Please continue it   Please wear event monitor as discussed   Keep follow up appt with PCP   Your physician recommends that you schedule a follow-up appointment in:  With Christell Faith PA about 1 week after event monitor

## 2014-07-26 ENCOUNTER — Ambulatory Visit (INDEPENDENT_AMBULATORY_CARE_PROVIDER_SITE_OTHER): Payer: Medicare Other | Admitting: Family Medicine

## 2014-07-26 ENCOUNTER — Encounter: Payer: Self-pay | Admitting: Family Medicine

## 2014-07-26 ENCOUNTER — Ambulatory Visit: Payer: Medicare Other | Admitting: Family Medicine

## 2014-07-26 ENCOUNTER — Other Ambulatory Visit: Payer: Self-pay | Admitting: Family Medicine

## 2014-07-26 VITALS — BP 120/60 | HR 72 | Temp 98.1°F | Wt 163.5 lb

## 2014-07-26 DIAGNOSIS — I471 Supraventricular tachycardia: Secondary | ICD-10-CM

## 2014-07-26 LAB — BASIC METABOLIC PANEL
BUN: 20 mg/dL (ref 6–23)
CO2: 28 meq/L (ref 19–32)
CREATININE: 1.2 mg/dL (ref 0.4–1.5)
Calcium: 8.9 mg/dL (ref 8.4–10.5)
Chloride: 97 mEq/L (ref 96–112)
GFR: 62.8 mL/min (ref >60.00)
GLUCOSE: 272 mg/dL — AB (ref 70–99)
Potassium: 4.2 mEq/L (ref 3.5–5.1)
Sodium: 130 mEq/L — ABNORMAL LOW (ref 135–145)

## 2014-07-26 LAB — CBC WITH DIFFERENTIAL/PLATELET
Basophils Absolute: 0 10*3/uL (ref 0.0–0.1)
Basophils Relative: 0.3 % (ref 0.0–3.0)
EOS ABS: 0 10*3/uL (ref 0.0–0.7)
Eosinophils Relative: 0.3 % (ref 0.0–5.0)
HCT: 35.5 % — ABNORMAL LOW (ref 39.0–52.0)
Hemoglobin: 11.6 g/dL — ABNORMAL LOW (ref 13.0–17.0)
Lymphocytes Relative: 6.8 % — ABNORMAL LOW (ref 12.0–46.0)
Lymphs Abs: 0.9 10*3/uL (ref 0.7–4.0)
MCHC: 32.5 g/dL (ref 30.0–36.0)
MCV: 91.9 fl (ref 78.0–100.0)
MONOS PCT: 5.3 % (ref 3.0–12.0)
Monocytes Absolute: 0.7 10*3/uL (ref 0.1–1.0)
NEUTROS PCT: 87.3 % — AB (ref 43.0–77.0)
Neutro Abs: 11.2 10*3/uL — ABNORMAL HIGH (ref 1.4–7.7)
Platelets: 383 10*3/uL (ref 150.0–400.0)
RBC: 3.87 Mil/uL — ABNORMAL LOW (ref 4.22–5.81)
RDW: 14.7 % (ref 11.5–15.5)
WBC: 12.8 10*3/uL — ABNORMAL HIGH (ref 4.0–10.5)

## 2014-07-26 NOTE — Assessment & Plan Note (Addendum)
Presume syncope from flomax, with SVT from pain/dehydration. Has cards f/u pending.  On BB.  Rate controlled, sounds to be NSR today.  Check labs.  Continue current meds for now.  D/w pt.  He agrees.  >25 minutes spent in face to face time with patient, >50% spent in counselling or coordination of care.

## 2014-07-26 NOTE — Patient Instructions (Signed)
Don't change your meds for now. Go to the lab on the way out.  We'll contact you with your lab report. I'll await the cardiology results.  Notify the pharmacy when you need refills and we'll handle them.  Take care.  Glad to see you.

## 2014-07-26 NOTE — Progress Notes (Signed)
Pre visit review using our clinic review tool, if applicable. No additional management support is needed unless otherwise documented below in the visit note.  To recap.  Added flomax--> syncope--> fall off ladder--> rib fx.  Admitted with small PTX and hematoma on L side of thorax.  Discharged home with pain meds and colace.   Then with SVT, to ER. Admitted.  Started on BB.  Demand ischemia likely.  NSR later in hospital, okay for discharge.  He didn't hold ACE or HCTZ on coming home.   Here for f/u.   Still with some L sided thorax pain, but pain improved, minimal pain med use.  No fevers.  Not SOB.  Moving slowly, with walker.  For for f/u labs.  No more sx of  SVT.  D/w pt.  Compliant with meds. Off flomax.   Meds, vitals, and allergies reviewed.   ROS: See HPI.  Otherwise, noncontributory.  GEN: nad, alert and oriented HEENT: mucous membranes moist NECK: supple w/o LA CV: rrr.  no murmur PULM: ctab, no inc wob ABD: soft, +bs, L side of thorax mildly ttp at hematoma on wall EXT: no edema SKIN: no acute rash

## 2014-07-28 ENCOUNTER — Other Ambulatory Visit: Payer: Self-pay | Admitting: *Deleted

## 2014-08-03 ENCOUNTER — Ambulatory Visit: Payer: Self-pay | Admitting: General Surgery

## 2014-08-05 ENCOUNTER — Other Ambulatory Visit (INDEPENDENT_AMBULATORY_CARE_PROVIDER_SITE_OTHER): Payer: Medicare Other

## 2014-08-05 ENCOUNTER — Ambulatory Visit (INDEPENDENT_AMBULATORY_CARE_PROVIDER_SITE_OTHER): Payer: Medicare Other | Admitting: Family Medicine

## 2014-08-05 ENCOUNTER — Telehealth: Payer: Self-pay | Admitting: Family Medicine

## 2014-08-05 DIAGNOSIS — I10 Essential (primary) hypertension: Secondary | ICD-10-CM

## 2014-08-05 LAB — BASIC METABOLIC PANEL
BUN: 17 mg/dL (ref 6–23)
CO2: 23 mEq/L (ref 19–32)
Calcium: 9.2 mg/dL (ref 8.4–10.5)
Chloride: 103 mEq/L (ref 96–112)
Creatinine, Ser: 1.2 mg/dL (ref 0.4–1.5)
GFR: 61.59 mL/min (ref >60.00)
Glucose, Bld: 214 mg/dL — ABNORMAL HIGH (ref 70–99)
POTASSIUM: 4.5 meq/L (ref 3.5–5.1)
SODIUM: 136 meq/L (ref 135–145)

## 2014-08-05 NOTE — Telephone Encounter (Signed)
Patient was on the schedule for OV, but only needed lab visit.  No charge for visit.  Saw patient.  He feels better, much less pain, had f/u re: hematoma w/o other intervention planned. Off HCTZ.  Not lightheaded.  No edema.  Due for f/u BMET, collected.  Still on cardiac monitor, no palpitations.  No syncope.   BP 136/72 p68 98%RA nad ncat rrr ctab Abd soft Ext w/o edema  AP: syncope, prev.  No more sx.  Continue off HCTZ for now.  BMET pending.  Will notify pt when resulted.  He agrees.  Doing well. Feels better.

## 2014-08-06 DIAGNOSIS — R002 Palpitations: Secondary | ICD-10-CM | POA: Insufficient documentation

## 2014-08-06 NOTE — Progress Notes (Signed)
   Subjective:    Patient ID: Aaron Becker, male    DOB: March 26, 1930, 80 y.o.   MRN: [id number]  HPI    Review of Systems     Objective:   Physical Exam        Assessment & Plan:  See phone note.

## 2014-08-10 ENCOUNTER — Encounter: Payer: Self-pay | Admitting: Physician Assistant

## 2014-08-13 ENCOUNTER — Telehealth: Payer: Self-pay

## 2014-08-13 NOTE — Telephone Encounter (Signed)
pts [facility]. Advised [facility] and have [facility]. Voiced understanding.

## 2014-08-21 ENCOUNTER — Other Ambulatory Visit: Payer: Self-pay | Admitting: Family Medicine

## 2014-08-25 ENCOUNTER — Other Ambulatory Visit: Payer: Self-pay | Admitting: Family Medicine

## 2014-08-30 LAB — HM DIABETES EYE EXAM

## 2014-09-02 ENCOUNTER — Ambulatory Visit (INDEPENDENT_AMBULATORY_CARE_PROVIDER_SITE_OTHER): Payer: Medicare Other | Admitting: Physician Assistant

## 2014-09-02 ENCOUNTER — Encounter: Payer: Self-pay | Admitting: Physician Assistant

## 2014-09-02 VITALS — BP 130/80 | HR 58 | Ht 66.0 in | Wt 165.8 lb

## 2014-09-02 DIAGNOSIS — I471 Supraventricular tachycardia: Secondary | ICD-10-CM

## 2014-09-02 DIAGNOSIS — R001 Bradycardia, unspecified: Secondary | ICD-10-CM | POA: Insufficient documentation

## 2014-09-02 DIAGNOSIS — I491 Atrial premature depolarization: Secondary | ICD-10-CM

## 2014-09-02 DIAGNOSIS — Z87898 Personal history of other specified conditions: Secondary | ICD-10-CM

## 2014-09-02 DIAGNOSIS — R7989 Other specified abnormal findings of blood chemistry: Secondary | ICD-10-CM

## 2014-09-02 DIAGNOSIS — R5383 Other fatigue: Secondary | ICD-10-CM

## 2014-09-02 DIAGNOSIS — Z9189 Other specified personal risk factors, not elsewhere classified: Secondary | ICD-10-CM

## 2014-09-02 DIAGNOSIS — R778 Other specified abnormalities of plasma proteins: Secondary | ICD-10-CM

## 2014-09-02 DIAGNOSIS — I248 Other forms of acute ischemic heart disease: Secondary | ICD-10-CM

## 2014-09-02 MED ORDER — METOPROLOL TARTRATE 25 MG PO TABS: 12.5000 mg | ORAL_TABLET | Freq: Two times a day (BID) | ORAL | Status: DC

## 2014-09-02 NOTE — Progress Notes (Signed)
Patient Name: Aaron Becker, Aaron Becker 1930/01/14, MRN [id number]  Date of Encounter: 09/02/2014  Primary Care Provider:  Elsie Stain, MD Primary Cardiologist:  Dr. Rockey Situ, MD  Patient Profile:  80 y.o. male with history below presents for routine follow up of arrhythmia and to discuss event monitor results.    Problem List:   Past Medical History  Diagnosis Date  . Diabetes mellitus, type II 1986  . Hypertension 02/02  . Hypothyroidism 1980's  . Elevated PSA 2011    per Dr. Jacqlyn Larsen with Uro  . Hyperlipidemia   . Syncope and collapse   . Palpitations   . SVT (supraventricular tachycardia)     a. reported SVT during admission at [facility] 06/2014  . Right rib fracture    Past Surgical History  Procedure Laterality Date  . Cholecystectomy  1967  . Back surgery  last 1978    x 3  . Varicose vein surgery  1975  . Vasectomy  1975  . Carotid ultrasound  02/02    wnl  . Cystectomy  06/14/04    mucous cyst excision with left thumb, IP joint debridement  . Prostate biopsy  11/01/08    Dr. Reece Agar     Allergies:  Allergies  Allergen Reactions  . Flomax [Tamsulosin Hcl] Other (See Comments)    syncope  . Rosuvastatin     REACTION: muscle stiffness  . Sulfonamide Derivatives     REACTION: unspecified reaction     HPI:  80 y.o. male history of recent fall off a ladder in early October for unknown reason and possible SVT that was seen in the ED on 10/22. At that time he was started on metoprolol 25 mg bid with good HR response. Recent echo from earlier in October from prior admission 2/2 to fall off ladder showed EF 60-65%, mildly impaired LV relaxation of diastolic filling, mild aortic stenosis. He wore a 30 day event monitor as an outpatient that showed sinus rhythm with rare PAC.     Home Medications:  Prior to Admission medications   Medication Sig Start Date End Date Taking? Authorizing Provider  Alcohol Swabs (B-D SINGLE USE SWABS REGULAR) PADS Use as instructed to  test blood sugar once daily.  Diagnosis:  250.00  Non-insulin dependent. 01/01/14   Tonia Ghent, MD  benazepril (LOTENSIN) 10 MG tablet TAKE 1 TABLET BY MOUTH EVERY DAY 08/23/14   Tonia Ghent, MD  benazepril (LOTENSIN) 10 MG tablet TAKE 1 TABLET BY MOUTH EVERY DAY 08/25/14   Tonia Ghent, MD  diphenhydrAMINE (BENADRYL) 25 mg capsule Take 25 mg by mouth daily.    Historical Provider, MD  docusate sodium (DOCU SOFT) 100 MG capsule Take 100 mg by mouth daily.    Historical Provider, MD  finasteride (PROSCAR) 5 MG tablet Take 5 mg by mouth daily.    Historical Provider, MD  folic acid (FOLVITE) 322 MCG tablet Take 400 mcg by mouth daily.      Historical Provider, MD  Glucos-Chondroit-Hyaluron-D3 (TRIGOSAMINE MAX ST PO) Take 2 by mouth daily     Historical Provider, MD  glucose blood (ONE TOUCH ULTRA TEST) test strip USE AS DIRECTED TO TEST BLOOD SUGAR ONCE DAILY. Dx. E11.9 07/20/14   Tonia Ghent, MD  glyBURIDE-metformin (GLUCOVANCE) 2.5-500 MG per tablet TAKE 1 TABLET BY MOUTH EVERY MORNING, 2 TABLETS AT SUPPER AND 1/2 AT BEDTIME 07/13/14   Tonia Ghent, MD  HYDROcodone-acetaminophen (NORCO/VICODIN) 5-325 MG per tablet Take 1 tablet by mouth  every 6 (six) hours as needed for moderate pain.    Historical Provider, MD  ibuprofen (ADVIL,MOTRIN) 200 MG tablet Take 200 mg by mouth every 6 (six) hours as needed for pain.    Historical Provider, MD  levothyroxine (SYNTHROID, LEVOTHROID) 75 MCG tablet TAKE 1 TABLET BY MOUTH EVERY DAY 04/15/14   Tonia Ghent, MD  Menthol, Topical Analgesic, (ZIMS MAX-FREEZE) 3.7 % GEL Apply topically daily.      Historical Provider, MD  metoprolol tartrate (LOPRESSOR) 25 MG tablet Take 1 tablet (25 mg total) by mouth 2 (two) times daily. 07/23/14   Rise Mu, PA-C  Multiple Vitamins-Minerals (CENTRUM SILVER PO) Take by mouth daily.      Historical Provider, MD  NON FORMULARY Mucous Relief Expectorant    Historical Provider, MD  Omega-3 Fatty Acids (FISH OIL)  1000 MG CAPS Take by mouth daily.      Historical Provider, MD  pravastatin (PRAVACHOL) 80 MG tablet Take 1 tablet (80 mg total) by mouth daily. 11/05/13   Tonia Ghent, MD  vitamin C (ASCORBIC ACID) 500 MG tablet Take 500 mg by mouth daily.      Historical Provider, MD     Weights: Filed Weights   09/02/14 1317  Weight: 165 lb 12 oz (75.184 kg)     Review of Systems:  All other systems reviewed and are otherwise negative except as noted above.  Physical Exam:  Blood pressure 130/80, pulse 58, height 5\' 6"  (1.676 m), weight 165 lb 12 oz (75.184 kg).  General: Pleasant, NAD Psych: Normal affect. Neuro: Alert and oriented X 3. Moves all extremities spontaneously. HEENT: Normal  Neck: Supple without bruits or JVD. Lungs:  Resp regular and unlabored, CTA. Heart: RRR no s3, s4, or murmurs. Abdomen: Soft, non-tender, non-distended, BS + x 4.  Extremities: No clubbing, cyanosis or edema.    Accessory Clinical Findings:  EKG - Sinus bradycardia, 58, no st/t changes  Assessment & Plan:  1. Presumed history of SVT: -Presented to [facility] 10/22 with HR into the 160s. Reportedly SVT though no strips available for review. 12 lead EKG upon admission that day showed NSR with frequent PACs -CardioNet with NSR with rare PAC -Decreased Lopressor to 12.5 mg bid 2/2 bradycardia and somnolence -follow symptoms  2. History of syncope: -Presumed 2/2 Flomax (off now) -Has remained off ladders -No further symptoms -CardioNet as above  3. Demand ischemia: -Lexiscan Myoview (with his recent syncopal episode and rib injury he is unable to tolerate treadmill) -Echo 06/2014 showed normal LV function -Continue Lopressor as above, aspirin, and pravastatin 80 mg  4. Mild aortic stenosis: -Continue medical therapy as above -Repeat echo 06/2015   Christell Faith, PA-C [facility] [address] [place], [place] [address] [phone] [place] 09/02/2014, 1:50  PM

## 2014-09-02 NOTE — Patient Instructions (Addendum)
-  Decrease metoprolol to 12.5 mg twice daily -We have scheduled you for a pharmacological stress test  -Continue current medications Your physician recommends that you schedule a follow-up appointment with Aaron Faith, PA in 2 months.  Box  Your caregiver has ordered a Stress Test with nuclear imaging. The purpose of this test is to evaluate the blood supply to your heart muscle. This procedure is referred to as a "Non-Invasive Stress Test." This is because other than having an IV started in your vein, nothing is inserted or "invades" your body. Cardiac stress tests are done to find areas of poor blood flow to the heart by determining the extent of coronary artery disease (CAD). Some patients exercise on a treadmill, which naturally increases the blood flow to your heart, while others who are  unable to walk on a treadmill due to physical limitations have a pharmacologic/chemical stress agent called Lexiscan . This medicine will mimic walking on a treadmill by temporarily increasing your coronary blood flow.   Please note: these test may take anywhere between 2-4 hours to complete  PLEASE REPORT TO [place] AT THE FIRST DESK WILL DIRECT YOU WHERE TO GO  Date of Procedure:______Tuesday, Dec 15__________  Arrival Time for Procedure:____10:15 am____________  Instructions regarding medication:   __X__ : Hold diabetes medication morning of procedure  __X__:  Hold betablocker(s) night before procedure and morning of procedure:  METOPROLOL   PLEASE NOTIFY THE OFFICE AT LEAST 24 HOURS IN ADVANCE IF YOU ARE UNABLE TO KEEP YOUR APPOINTMENT.  [phone] AND  PLEASE NOTIFY NUCLEAR MEDICINE AT [facility] AT LEAST 24 HOURS IN ADVANCE IF YOU ARE UNABLE TO KEEP YOUR APPOINTMENT. [phone]  How to prepare for your Myoview test:  1. Do not eat or drink after midnight 2. No caffeine for 24 hours prior to test 3. No smoking 24 hours prior to test. 4. Your  medication may be taken with water.  If your doctor stopped a medication because of this test, do not take that medication. 5. Ladies, please do not wear dresses.  Skirts or pants are appropriate. Please wear a short sleeve shirt. 6. No perfume, cologne or lotion. 7. Wear comfortable walking shoes. No heels!

## 2014-09-06 ENCOUNTER — Other Ambulatory Visit: Payer: Self-pay

## 2014-09-06 ENCOUNTER — Ambulatory Visit (INDEPENDENT_AMBULATORY_CARE_PROVIDER_SITE_OTHER): Payer: Medicare Other

## 2014-09-06 DIAGNOSIS — R002 Palpitations: Secondary | ICD-10-CM

## 2014-09-06 DIAGNOSIS — R55 Syncope and collapse: Secondary | ICD-10-CM

## 2014-09-07 ENCOUNTER — Ambulatory Visit: Payer: Self-pay | Admitting: Physician Assistant

## 2014-09-07 DIAGNOSIS — R0602 Shortness of breath: Secondary | ICD-10-CM

## 2014-09-10 ENCOUNTER — Other Ambulatory Visit: Payer: Self-pay

## 2014-09-10 DIAGNOSIS — I491 Atrial premature depolarization: Secondary | ICD-10-CM

## 2014-09-10 DIAGNOSIS — R7989 Other specified abnormal findings of blood chemistry: Secondary | ICD-10-CM

## 2014-09-10 DIAGNOSIS — R778 Other specified abnormalities of plasma proteins: Secondary | ICD-10-CM

## 2014-09-14 ENCOUNTER — Encounter: Payer: Self-pay | Admitting: Family Medicine

## 2014-09-21 ENCOUNTER — Ambulatory Visit (INDEPENDENT_AMBULATORY_CARE_PROVIDER_SITE_OTHER): Payer: Medicare Other | Admitting: Family Medicine

## 2014-09-21 ENCOUNTER — Encounter: Payer: Self-pay | Admitting: Family Medicine

## 2014-09-21 VITALS — BP 168/80 | HR 63 | Temp 97.7°F | Wt 172.0 lb

## 2014-09-21 DIAGNOSIS — I10 Essential (primary) hypertension: Secondary | ICD-10-CM

## 2014-09-21 MED ORDER — FINASTERIDE 5 MG PO TABS: 5.0000 mg | ORAL_TABLET | Freq: Every day | ORAL | Status: DC

## 2014-09-21 MED ORDER — HYDROCHLOROTHIAZIDE 12.5 MG PO TABS: 6.2500 mg | ORAL_TABLET | Freq: Every day | ORAL | Status: DC | PRN

## 2014-09-21 NOTE — Progress Notes (Signed)
Pre visit review using our clinic review tool, if applicable. No additional management support is needed unless otherwise documented below in the visit note.  He had his cardiac monitor and stress testing were unremarkable/low risk.  D/w pt.  His BB was cut due to low pulse.  He has improved in the meantime.  More puffy, ankles, over the last few weeks.  Off HCTZ in the meantime.  Worse edema at the end of the day, better in AM No syncope.   No CP. Not SOB.    Meds, vitals, and allergies reviewed.   ROS: See HPI.  Otherwise, noncontributory.  GEN: nad, alert and oriented HEENT: mucous membranes moist NECK: supple w/o LA CV: rrr.  PULM: ctab, no inc wob ABD: soft, +bs EXT: 2+ BLE edema SKIN: no acute rash

## 2014-09-21 NOTE — Patient Instructions (Signed)
Take a half of a HCTZ for the next few days and update me on your BP and swelling next week.  You may eventually need to take up to a whole pill a day occasionally or you may have a day where you need to skip a dose.  Take care.  Glad to see you.

## 2014-09-22 NOTE — Assessment & Plan Note (Signed)
D/w pt.  Add back on lower dose of HCTZ and he'll update me.  We may have to go back to 12.5mg  a day, but he'll take 6.25 daily for now. Already on low salt diet.  He agrees with plan.

## 2014-09-28 ENCOUNTER — Telehealth: Payer: Self-pay | Admitting: *Deleted

## 2014-09-28 NOTE — Telephone Encounter (Signed)
BP log in your IN box for review.

## 2014-09-29 MED ORDER — HYDROCHLOROTHIAZIDE 12.5 MG PO TABS: 12.5000 mg | ORAL_TABLET | Freq: Every day | ORAL | Status: DC | PRN

## 2014-09-29 NOTE — Telephone Encounter (Signed)
please call pt.  BPs usually 150s/70.   Would try to go back to 12.5mg  a day of HCTZ and recheck BP/labs at an OV in about 1 week. Thanks.  Med list updated.

## 2014-09-29 NOTE — Telephone Encounter (Signed)
Patient's friend Manus Gunning) notified as instructed by telephone. Manus Gunning verbalized understanding. Follow-up appointment scheduled.

## 2014-10-02 ENCOUNTER — Other Ambulatory Visit: Payer: Self-pay | Admitting: Family Medicine

## 2014-10-08 ENCOUNTER — Encounter: Payer: Self-pay | Admitting: Family Medicine

## 2014-10-08 ENCOUNTER — Ambulatory Visit (INDEPENDENT_AMBULATORY_CARE_PROVIDER_SITE_OTHER): Payer: Medicare Other | Admitting: Family Medicine

## 2014-10-08 ENCOUNTER — Telehealth: Payer: Self-pay | Admitting: Family Medicine

## 2014-10-08 VITALS — BP 142/74 | HR 71 | Temp 98.5°F | Wt 164.8 lb

## 2014-10-08 DIAGNOSIS — I10 Essential (primary) hypertension: Secondary | ICD-10-CM

## 2014-10-08 NOTE — Telephone Encounter (Signed)
emmi mailed  °

## 2014-10-08 NOTE — Patient Instructions (Signed)
Go to the lab on the way out.  We'll contact you with your lab report. Stop the metoprolol and update me on your BP next week.  We may have to increase your benazepril at that point.  Take care.  Glad to see you.

## 2014-10-08 NOTE — Progress Notes (Signed)
Pre visit review using our clinic review tool, if applicable. No additional management support is needed unless otherwise documented below in the visit note.  HTN f/u.  Noted that he felt "awful" on BB with his arms feeling cold.  Less sx on dec dose of BB, but not resolved.   Dec edema back on HCTZ. Still with occ lightheadedness on standing, but this is brief.  Asking about options for BP meds.   Meds, vitals, and allergies reviewed.   ROS: See HPI.  Otherwise, noncontributory.  GEN: nad, alert and oriented HEENT: mucous membranes moist NECK: supple w/o LA CV: rrr. PULM: ctab, no inc wob ABD: soft, +bs EXT: trace edema SKIN: no acute rash

## 2014-10-09 LAB — BASIC METABOLIC PANEL
BUN: 28 mg/dL — ABNORMAL HIGH (ref 6–23)
CO2: 27 mEq/L (ref 19–32)
CREATININE: 1.28 mg/dL (ref 0.50–1.35)
Calcium: 9.4 mg/dL (ref 8.4–10.5)
Chloride: 99 mEq/L (ref 96–112)
Glucose, Bld: 169 mg/dL — ABNORMAL HIGH (ref 70–99)
Potassium: 4.3 mEq/L (ref 3.5–5.3)
SODIUM: 138 meq/L (ref 135–145)

## 2014-10-10 NOTE — Assessment & Plan Note (Signed)
He'll stop the metoprolol and update me on his BP next week.  We may have to increase his benazepril at that point- see notes on labs.

## 2014-11-04 ENCOUNTER — Ambulatory Visit (INDEPENDENT_AMBULATORY_CARE_PROVIDER_SITE_OTHER): Payer: Medicare Other | Admitting: Family Medicine

## 2014-11-04 ENCOUNTER — Encounter: Payer: Self-pay | Admitting: Family Medicine

## 2014-11-04 VITALS — BP 140/68 | HR 74 | Temp 98.3°F | Wt 169.2 lb

## 2014-11-04 DIAGNOSIS — E119 Type 2 diabetes mellitus without complications: Secondary | ICD-10-CM

## 2014-11-04 DIAGNOSIS — I10 Essential (primary) hypertension: Secondary | ICD-10-CM

## 2014-11-04 MED ORDER — METFORMIN HCL 500 MG PO TABS: ORAL_TABLET | ORAL | Status: DC

## 2014-11-04 MED ORDER — GLYBURIDE 2.5 MG PO TABS: ORAL_TABLET | ORAL | Status: DC

## 2014-11-04 NOTE — Progress Notes (Signed)
Pre visit review using our clinic review tool, if applicable. No additional management support is needed unless otherwise documented below in the visit note.  DM2.  Sugar controlled at home, ~130 usually, no low.  Combo med not covered by insurance.  No ADE on med.    HTN.  occ lightheaded with position changes.  Sx usually worse in the afternoons.  No CP.  Not SOB. Wife brought BP log.  He has usually been ~130-140/70, occ higher, checked at variable times throughout the day.   Meds, vitals, and allergies reviewed.   ROS: See HPI.  Otherwise, noncontributory.  nad ncat Mmm Neck supple, no LA rrr ctab abd soft Ext w/o edema

## 2014-11-04 NOTE — Patient Instructions (Addendum)
Bring me your BP chart and I'll look at that.  Glyburide: TAKE 1 TABLET BY MOUTH EVERY MORNING, 2 TABLETS AT SUPPER  Metformin: TAKE 1 TABLET BY MOUTH EVERY MORNING, 2 TABLETS AT SUPPER AND 1 EXTRA AT NIGHT IF NEEDED.  That should replace your previous diabetes medicine.  Take care.  Glad to see you.

## 2014-11-05 ENCOUNTER — Other Ambulatory Visit: Payer: Self-pay | Admitting: Family Medicine

## 2014-11-05 ENCOUNTER — Telehealth: Payer: Self-pay | Admitting: Family Medicine

## 2014-11-05 ENCOUNTER — Telehealth: Payer: Self-pay | Admitting: *Deleted

## 2014-11-05 NOTE — Telephone Encounter (Signed)
Please call pt.  BP log reviewed, thanks for dropping off.  Would try taking HCTZ in AM and benazepril in PM. That should help.  If already doing that, then continue HCTZ in AM and split benazepril in half, with 1/2 in AM and 1/2 in PM.   Thanks.

## 2014-11-05 NOTE — Telephone Encounter (Signed)
Erroneous encounter

## 2014-11-05 NOTE — Assessment & Plan Note (Signed)
Okay to split meds out to separate pills. That should be covered.  No sig change in dose o/w, other than cutting out prn SU qhs and using whole tab metformin prn qhs instead, dw pt.  Recheck at [place] in 11/2014.  He agrees.

## 2014-11-05 NOTE — Telephone Encounter (Signed)
Patient advised.

## 2014-11-05 NOTE — Assessment & Plan Note (Signed)
Would try taking HCTZ in AM and ACE in PM.  Will notify pt.  See following phone note.

## 2014-11-07 ENCOUNTER — Other Ambulatory Visit: Payer: Self-pay | Admitting: Family Medicine

## 2014-11-07 MED ORDER — GLIMEPIRIDE 1 MG PO TABS: 1.0000 mg | ORAL_TABLET | Freq: Every day | ORAL | Status: DC

## 2014-11-07 NOTE — Progress Notes (Signed)
Please call pt.  Glyburide not covered.  Change to glimepiride.  rx sent.  Take 1 a day.  Continue as planned with the metformin.   This is a low dose of the glimepiride, take it in AM and if sugars are consistently running high we can adjust the dose.   Use it for a few days and then update me if needed.  Thanks.

## 2014-11-08 NOTE — Progress Notes (Signed)
Wife advised. 

## 2014-11-16 ENCOUNTER — Telehealth: Payer: Self-pay

## 2014-11-16 NOTE — Telephone Encounter (Signed)
PLEASE NOTE: All timestamps contained within this report are represented as [place] Standard Time. CONFIDENTIALTY NOTICE: This fax transmission is intended only for the addressee. It contains information that is legally privileged, confidential or otherwise protected from use or disclosure. If you are not the intended recipient, you are strictly prohibited from reviewing, disclosing, copying using or disseminating any of this information or taking any action in reliance on or regarding this information. If you have received this fax in error, please notify us immediately by telephone so that we can arrange for its return to [place]. Phone: [phone], Toll-Free: [phone], Fax: [phone] Page: 1 of 1 Call Id: [id number] [place] Patient Name: Aaron Becker Gender: Male DOB: 10-13-1929 Age: 26 Y 24 M 21 D Return Phone Number: [phone] (Primary) Address: City/State/Zip:  Client [place] [facility] - Client Client Site [place] Physician Renford Dills Contact Type Call Caller Name Gretta Cool Phone Number [phone] Relationship To Patient Care Giver Is this call to report lab results? No Call Type General Information Initial Comment Caller states he has been vomiting. Temp- 99. General Information Type Call Transferred Nurse Assessment Guidelines Guideline Title Affirmed Question Affirmed Notes Nurse Date/Time (Eastern Time) Disp. Time [name] Time) Disposition Final User 11/16/2014 9:23:27 AM General Information Provided Yes Baruch Goldmann After Care Instructions Given Call Event Type User Date / Time Description

## 2014-11-16 NOTE — Telephone Encounter (Signed)
If his fever is back down, not vomiting, and BP improved (and that seems to be the case), then likely wouldn't need OV.   There has been a GI bug in the community recently.   Rec: rest, fluids (avoid dairy), call back as needed.  Thanks.

## 2014-11-16 NOTE — Telephone Encounter (Signed)
Aaron Becker with team health transferred call from Ms Aaron Becker; Ms Aaron Becker hung up prior to transfer and I called and spoke with Ms Aaron Becker. Pt was last seen 11/04/14; on 11/15/14 at 5 pm pt started with vomiting. 5:15 pm BP 172/78 P 94 T 100 and BS 125. At 6:45 pm BP 129/62 P 83.  At [facility] pt vomited large amt and this is the last time pt vomited. 9 PM BP 157/72 P103 T 101.7 and pt took 2 tylenol.  At 10PM BP 125/61 P 82 T 100 BS 293. Pt rested well during the night. Today pt seems to be doing better. Today at 7:30 AM BP 129/61 P 77 T 99 pt has not taken BS today. Pt does not think he needs to be seen but Ms Aaron Becker wants Dr Carole Civil advise of what to do or does pt need to be seen. (pt will only see Dr Damita Dunnings). Ms Aaron Becker request cb  [phone].

## 2014-11-16 NOTE — Telephone Encounter (Signed)
Wife advised. 

## 2014-11-22 ENCOUNTER — Telehealth: Payer: Self-pay | Admitting: Family Medicine

## 2014-11-22 MED ORDER — GLIMEPIRIDE 1 MG PO TABS: 2.0000 mg | ORAL_TABLET | Freq: Every day | ORAL | Status: DC

## 2014-11-22 NOTE — Telephone Encounter (Signed)
Aaron Becker calls because pt has recently had a change in is blood sugar medication and his blood sugar has been running 200+ and they would like to speak to someone about this. They want to see if maybe there needs to be a change in blood sugar meds. His blood pressure at 6:45am was 155/84.

## 2014-11-22 NOTE — Telephone Encounter (Signed)
Wife advised. 

## 2014-11-22 NOTE — Telephone Encounter (Signed)
Start taking 2 of the glimepiride each AM.  That should help.  Update me in a few days.  We can go up on the dose if needed.   If he has already taken 1 pill today, then go ahead and get the second pill in.   Continue other meds as is.  Thanks.

## 2014-12-05 ENCOUNTER — Other Ambulatory Visit: Payer: Self-pay | Admitting: Family Medicine

## 2014-12-05 DIAGNOSIS — E119 Type 2 diabetes mellitus without complications: Secondary | ICD-10-CM

## 2014-12-07 ENCOUNTER — Other Ambulatory Visit (INDEPENDENT_AMBULATORY_CARE_PROVIDER_SITE_OTHER): Payer: Medicare Other

## 2014-12-07 DIAGNOSIS — E119 Type 2 diabetes mellitus without complications: Secondary | ICD-10-CM

## 2014-12-07 LAB — LIPID PANEL
CHOL/HDL RATIO: 2
Cholesterol: 109 mg/dL (ref 0–200)
HDL: 50.3 mg/dL (ref >39.00)
LDL CALC: 42 mg/dL (ref 0–99)
NonHDL: 58.7
TRIGLYCERIDES: 86 mg/dL (ref 0.0–149.0)
VLDL: 17.2 mg/dL (ref 0.0–40.0)

## 2014-12-07 LAB — COMPREHENSIVE METABOLIC PANEL
ALT: 34 U/L (ref 0–53)
AST: 31 U/L (ref 0–37)
Albumin: 4.1 g/dL (ref 3.5–5.2)
Alkaline Phosphatase: 52 U/L (ref 39–117)
BILIRUBIN TOTAL: 0.9 mg/dL (ref 0.2–1.2)
BUN: 22 mg/dL (ref 6–23)
CALCIUM: 9.6 mg/dL (ref 8.4–10.5)
CO2: 31 meq/L (ref 19–32)
Chloride: 104 mEq/L (ref 96–112)
Creatinine, Ser: 1.26 mg/dL (ref 0.40–1.50)
GFR: 58.17 mL/min — ABNORMAL LOW (ref >60.00)
Glucose, Bld: 153 mg/dL — ABNORMAL HIGH (ref 70–99)
POTASSIUM: 4.3 meq/L (ref 3.5–5.1)
SODIUM: 139 meq/L (ref 135–145)
Total Protein: 6.8 g/dL (ref 6.0–8.3)

## 2014-12-07 LAB — TSH: TSH: 0.77 u[IU]/mL (ref 0.35–4.50)

## 2014-12-07 LAB — HEMOGLOBIN A1C: HEMOGLOBIN A1C: 8 % — AB (ref 4.6–6.5)

## 2014-12-14 ENCOUNTER — Ambulatory Visit (INDEPENDENT_AMBULATORY_CARE_PROVIDER_SITE_OTHER): Payer: Medicare Other | Admitting: Family Medicine

## 2014-12-14 ENCOUNTER — Encounter: Payer: Self-pay | Admitting: Family Medicine

## 2014-12-14 VITALS — BP 116/66 | HR 62 | Temp 98.3°F | Ht 66.0 in | Wt 167.5 lb

## 2014-12-14 DIAGNOSIS — E119 Type 2 diabetes mellitus without complications: Secondary | ICD-10-CM

## 2014-12-14 DIAGNOSIS — Z0000 Encounter for general adult medical examination without abnormal findings: Secondary | ICD-10-CM | POA: Diagnosis not present

## 2014-12-14 DIAGNOSIS — R413 Other amnesia: Secondary | ICD-10-CM

## 2014-12-14 DIAGNOSIS — E039 Hypothyroidism, unspecified: Secondary | ICD-10-CM

## 2014-12-14 DIAGNOSIS — R972 Elevated prostate specific antigen [PSA]: Secondary | ICD-10-CM

## 2014-12-14 DIAGNOSIS — I10 Essential (primary) hypertension: Secondary | ICD-10-CM

## 2014-12-14 DIAGNOSIS — E7800 Pure hypercholesterolemia, unspecified: Secondary | ICD-10-CM

## 2014-12-14 NOTE — Progress Notes (Signed)
Pre visit review using our clinic review tool, if applicable. No additional management support is needed unless otherwise documented below in the visit note.  I have personally reviewed the Medicare Annual Wellness questionnaire and have noted 1. The patient's medical and social history 2. Their use of alcohol, tobacco or illicit drugs 3. Their current medications and supplements 4. The patient's functional ability including ADL's, fall risks, home safety risks and hearing or visual             impairment. 5. Diet and physical activities 6. Evidence for depression or mood disorders  The patients weight, height, BMI have been recorded in the chart and visual acuity is per eye clinic.  I have made referrals, counseling and provided education to the patient based review of the above and I have provided the pt with a written personalized care plan for preventive services.  Provider list updated- see scanned forms.  Routine anticipatory guidance given to patient.  See health maintenance.  Flu 2014 Shingles 2013 PNA 2001 Tetanus 2010 Colonoscopy NA due to age.  He agrees.  Prostate cancer screening NA due to age.  He agrees.  Cognitive function addressed- see scanned forms- and if abnormal then additional documentation follows.   Diabetes:  Using medications without difficulties: yes Hypoglycemic episodes:no Hyperglycemic episodes: rare, improved with PM prn use of metformin Feet problems:no Blood Sugars averaging: controlled recently, usually 120-150 in AM eye exam within last year:yes  Hypertension:    Using medication without problems or lightheadedness: yes Chest pain with exertion:no Edema:no Short of breath:no Average home GEZ:[id number] on home checks  Elevated Cholesterol: Using medications without problems:yes Muscle aches: no Diet compliance: yes Exercise: as tolerated  PMH and SH reviewed.   Vital signs, Meds and allergies reviewed.  ROS: See HPI.  Otherwise  nontributory.   GEN: nad, alert and oriented HEENT: mucous membranes moist NECK: supple w/o LA CV: rrr.  no murmur PULM: ctab, no inc wob ABD: soft, +bs EXT: no edema SKIN: no acute rash  Diabetic foot exam: Normal inspection No skin breakdown No calluses  Normal DP pulses Normal sensation to light tough and monofilament Nails normal

## 2014-12-14 NOTE — Patient Instructions (Signed)
Take care.  Glad to see you.  Don't change your meds.  Recheck in about 6 months, labs ahead of time.

## 2014-12-15 NOTE — Assessment & Plan Note (Signed)
He declined f/u at this point.  This is reasonable.

## 2014-12-15 NOTE — Assessment & Plan Note (Signed)
Controlled, compliant with meds.  Continue as is. D/w pt.  He agrees.

## 2014-12-15 NOTE — Assessment & Plan Note (Signed)
Flu 2014  Shingles 2013  PNA 2001  Tetanus 2010  Colonoscopy NA due to age. He agrees.  Prostate cancer screening NA due to age. He agrees.  Cognitive function addressed- see scanned forms- and if abnormal then additional documentation follows.

## 2014-12-15 NOTE — Assessment & Plan Note (Signed)
tsh okay, compliant with meds.  Continue as is.

## 2014-12-15 NOTE — Assessment & Plan Note (Signed)
Controlled, compliant with meds.  Continue as is.

## 2014-12-15 NOTE — Assessment & Plan Note (Signed)
Reasonably controlled, compliant with meds.  Continue as is.  Would aim for goal A1c ~8 to limit low sugars.

## 2014-12-15 NOTE — Assessment & Plan Note (Signed)
See scanned forms.  Normal brief testing today.

## 2014-12-28 ENCOUNTER — Other Ambulatory Visit: Payer: Self-pay | Admitting: *Deleted

## 2014-12-28 MED ORDER — GLIMEPIRIDE 1 MG PO TABS: 2.0000 mg | ORAL_TABLET | Freq: Every day | ORAL | Status: DC

## 2014-12-31 ENCOUNTER — Other Ambulatory Visit: Payer: Self-pay | Admitting: Family Medicine

## 2015-01-03 ENCOUNTER — Other Ambulatory Visit: Payer: Self-pay | Admitting: *Deleted

## 2015-01-15 NOTE — Discharge Summary (Signed)
PATIENT NAME:  Aaron Becker, Aaron Becker MR#:  [id number] DATE OF BIRTH:  08-27-1930  DATE OF ADMISSION:  07/09/2014 DATE OF DISCHARGE:  07/13/2014  DISCHARGE DIAGNOSES:  1. Fall with rib fractures x2 and small pneumothorax.  2. History of hypertension.  3. History of benign prostatic hypertrophy.  4. History of hyperlipidemia.  5. History of diabetes mellitus type 2.   PROCEDURES PERFORMED:  None.  DISCHARGE MEDICATIONS: As follows: Norco 1 tablet p.o. q.4 h. p.r.n. pain, Benadryl 25 mg p.o. at bedtime, benazepril 10 mg p.o. daily, betamethasone-clotrimazole topical daily, Centrum Silver 1 tab p.o. daily, Colace 100 mg p.o. daily, finasteride 5 mg p.o. daily, fish oil, folate, glyburide/metformin 2.5/500, 1 tab p.o. a.m., and 2 tablets q.p.m., 0.5 mg tablets p.o. q. bedtime p.r.n., hydrochlorothiazide 12.5 mg p.o. daily, levothyroxine 75 mcg p.o. daily, pravastatin 40 mg p.o. 2 tablets p.o. daily, trigosamine p.o. daily, vitamin C 1 tab p.o. daily, zinc gluconate 1 tab p.o. daily.   INDICATION FOR ADMISSION: Mr. Jaskiewicz is a pleasant, 81 year old male who presented after what sounds like a syncopal episode, who fell 10 feet while trimming a tree branch. He has brief loss of consciousness. He had a thorough work-up, which showed a small pneumothorax and 2 rib fractures. He was admitted for monitoring of pneumothorax to ensure that he did not need tube thoracotomy as well as to manage pain management.   HOSPITAL COURSE: Mr. Alberto was admitted. He had follow-up x-ray, which showed stable pneumothorax. Throughout the hospital course, he was worked up for a possible syncopal episode, and it was thought to be due to his Flomax and possible hypotension. He was changed per medicine recommendations to finasteride. Throughout hospital course, he required less IV pain medicine. At the time of discharge, he was able to tolerate p.o. pain medicine with good  pain control. He was at the time of discharge, discharged in  satisfactory condition.   DISCHARGE INSTRUCTIONS: Mr. Soledad is to call or return to the ED if he has increased pain, nausea, vomiting, redness, drainage from incision.    ____________________________ Glena Norfolk Ramiya Delahunty, MD cal:je D: 07/21/2014 07:40:16 ET T: 07/21/2014 12:36:30 ET JOB#: [id number]  cc: Harrell Gave A. Kasara Schomer, MD, <Dictator> Floyde Parkins MD ELECTRONICALLY SIGNED 07/27/2014 7:01

## 2015-01-15 NOTE — Consult Note (Signed)
General Aspect PCP: Dr. Damita Dunnings, MD Primary Cardiologist: New to [facility] _______________________  81 year old male with history of DM2, HTN, HLD, hypothyroidism, and BPH who was recently admitted to [facility] from 10/16-10/20 secondary to a syncopal episode sustained while on a ladder in which he occured multiple broken ribs and a hemopneumothorax. He returned to [facility] on 07/15/14 with increased SOB, palpitations, and fast HR all day. In the ED his HR was found to be in the 160s. He was found to be in SVT. He did not receive any adenosine. He was started on metoprolol 25 mg bid. HR currently 70s. We are consulted for further evaluation.  ______________________  PMH: 1. DM2 2. HTN 3. HLD 4. Hypothyroidism 5. BPH ______________________   Present Illness 81 year old male with the above problem list who presented to [facility] on the evening of 10/22 after a recent admission (10/16-10/20) to [facility] for a syncopal episode while on a ladder during which he sustained multiple broken ribs and a hemopneumothorax.   Patient with no known prior cardiac history including ischemic evaluation is arrhythmia evaluations. He is an active guy at home at baseline prior to his above syncopal episode while on the ladder. When questioning him about this fall he states he was in his usual state of health that AM. He went out to breakfast like he always does, came home was originally going to [facility] the lawn but it was too wet so he decided to trim a few branches back. He climbed the ladder, felt fine. The next thing he knew he was on the ground. He denies feeling any chest pain, palpitations, SOB, diaphoresis, nausea, vomiting, or presyncope. While he was admitted prior notes indicate no arrhythmia events. Echo done while inpatient showed EF 60-65%, mildly impaired LV relaxation of diastolic filling, mild aortic stenosis.   He presented to [facility] on the evening of 10/22 with SOB, palpitations, and complaints of an elevated HR all day. In  the ED his HR was found to be in the 160s. He did not receive any adenosine. He was started on metoprolol 25 mg bid. HR currently ***. He denies any chest pain (outside of rib fractures). No increased dyspnea. No nausea, vomiting, presyncope, or syncope. His daughter indicates he has not been drinkning as much water as he should have been. EKG showed sinus tach 108 with frequent PACs. Nonspecific st/t changes. Labs showed SCr 1.34 - 1.46 - 1.27, K+ 4.1 - 3.3 - 3.6, TnI 0.09 - 0.08 - 0.07, WBC 11.7 - 13.4 - 9.4, CXR showed Small bilateral pleural effusions and bibasilar atelectasis. Ventilation improved since chest radiographs on 07/12/14.   Physical Exam:  GEN well developed, well nourished, no acute distress   HEENT PERRL, hearing intact to voice, moist oral mucosa   NECK supple   RESP normal resp effort  clear BS   CARD Regular rate and rhythm  Normal, S1, S2  No murmur   ABD denies tenderness  soft  normal BS   EXTR negative edema   SKIN normal to palpation   NEURO cranial nerves intact   PSYCH alert, A+O to time, place, person, good insight   Review of Systems:  General: Fatigue   Skin: No Complaints   ENT: No Complaints   Eyes: No Complaints   Neck: No Complaints   Respiratory: No Complaints   Cardiovascular: Chest pain or discomfort  Palpitations   Gastrointestinal: No Complaints   Genitourinary: No Complaints   Vascular: No Complaints  Musculoskeletal: No Complaints   Neurologic: No Complaints   Hematologic: No Complaints   Endocrine: No Complaints   Psychiatric: No Complaints   Review of Systems: All other systems were reviewed and found to be negative   Medications/Allergies Reviewed Medications/Allergies reviewed   Family & Social History:  Family and Social History:  Family History Cancer   Social History negative tobacco, negative ETOH, negative Illicit drugs   Place of Living Home     Prostate BPH:    Hypercholesterolemia:     HTN:    Diabetes Mellitus, Type II (NIDD):    Right leg vein stripping:    Gall Bladder Surgery:    Back Surgery X 3:   Home Medications: Medication Instructions Status  finasteride 5 mg oral tablet 1 tab(s) orally once a day Active  acetaminophen-HYDROcodone 325 mg-5 mg oral tablet 1 tab(s) orally every 4 hours, As needed, moderate pain (4-6/10) Active  docusate sodium 100 mg oral capsule 1 cap(s) orally once a day Active  Benadryl 25 mg oral tablet 1 tab(s) orally once a day (at bedtime) Active  benazepril 10 mg oral tablet 1 tab(s) orally once a day (in the morning) Active  glyBURIDE-metformin 2.5 mg-500 mg oral tablet 1 tab(s) orally once a day (in the morning) Active  hydrochlorothiazide 12.5 mg oral capsule 1 cap(s) orally once a day (in the morning) Active  levothyroxine 75 mcg (0.075 mg) oral tablet 1 tab(s) orally once a day (in the morning) Active  betamethasone-clotrimazole topical 0.05%-1% topical cream Apply topically to affected area 2 times a day, As Needed for irritation Active  glyBURIDE-metFORMIN 2.5 mg-500 mg oral tablet 2 tab(s) orally once a day (in the evening) Active  glyBURIDE-metFORMIN 2.5 mg-500 mg oral tablet 0.5 tab(s) orally once a day (at bedtime), As Needed for high blood sugar Active  pravastatin 40 mg oral tablet 2 tab(s) orally once a day (at bedtime) Active  Trigosamine tablet 1 tab(s) orally 2 times a day Active  Centrum Silver Therapeutic Multiple Vitamins with Minerals oral tablet 1 tab(s) orally once a day Active  Fish Oil - oral capsule 1 cap(s) orally once a day Active  folic acid 1 tab(s) orally once a day Active  Vitamin C 1 tab(s) orally once a day Active  zinc gluconate 1 tab(s) orally once a day, As Needed for supplement Active   Lab Results:  Thyroid:  23-Oct-15 02:20   Thyroid Stimulating Hormone 0.805 (0.45-4.50 (IU = International Unit)  ----------------------- Pregnant patients have  different reference  ranges for TSH:  - -  - - - - - - - -  Pregnant, first trimetser:  0.36 - 2.50 uIU/mL)  Routine Chem:  22-Oct-15 17:50   BUN  59  Creatinine (comp)  1.46  Potassium, Serum  3.3  23-Oct-15 02:20   Result Comment TROPONIN - RESULTS VERIFIED BY REPEAT TESTING.  - PREVIOUSLY CALLED @ 1844 07-15-14  - 07-16-14 0315 SJL  Result(s) reported on 16 Jul 2014 at 03:17AM.  Glucose, Serum  171  BUN  49  Creatinine (comp) 1.27  Sodium, Serum  132  Potassium, Serum 3.6  Chloride, Serum 99  CO2, Serum 27  Calcium (Total), Serum  7.3  Anion Gap  6  Osmolality (calc) 282  eGFR (African American) >60  eGFR (Non-African American)  58 (eGFR values <54m/min/1.73 m2 may be an indication of chronic kidney disease (CKD). Calculated eGFR, using the MRDR Study equation, is useful in  patients with stable renal function. The eGFR calculation  will not be reliable in acutely ill patients when serum creatinine is changing rapidly. It is not useful in patients on dialysis. The eGFR calculation may not be applicable to patients at the low and high extremes of body sizes, pregnant women, and vetetarians.)  Cholesterol, Serum 95  Triglycerides, Serum 146  HDL (INHOUSE)  29  VLDL Cholesterol Calculated 29  LDL Cholesterol Calculated 37 (Result(s) reported on 16 Jul 2014 at 03:18AM.)  Cardiac:  22-Oct-15 17:50   Troponin I  0.09 (0.00-0.05 0.05 ng/mL or less: NEGATIVE  Repeat testing in 3-6 hrs  if clinically indicated. >0.05 ng/mL: POTENTIAL  MYOCARDIAL INJURY. Repeat  testing in 3-6 hrs if  clinically indicated. NOTE: An increase or decrease  of 30% or more on serial  testing suggests a  clinically important change)    19:54   Troponin I  0.08 (0.00-0.05 0.05 ng/mL or less: NEGATIVE  Repeat testing in 3-6 hrs  if clinically indicated. >0.05 ng/mL: POTENTIAL  MYOCARDIAL INJURY. Repeat  testing in 3-6 hrs if  clinically indicated. NOTE: An increase or decrease  of 30% or more on serial  testing suggests a   clinically important change)  23-Oct-15 02:20   Troponin I  0.07 (0.00-0.05 0.05 ng/mL or less: NEGATIVE  Repeat testing in 3-6 hrs  if clinically indicated. >0.05 ng/mL: POTENTIAL  MYOCARDIAL INJURY. Repeat  testing in 3-6 hrs if  clinically indicated. NOTE: An increase or decrease  of 30% or more on serial  testing suggests a  clinically important change)  Routine Hem:  22-Oct-15 17:50   WBC (CBC)  13.4  23-Oct-15 02:20   WBC (CBC) 9.4  RBC (CBC)  3.24  Hemoglobin (CBC)  9.6  Hematocrit (CBC)  29.5  Platelet Count (CBC) 246  MCV 91  MCH 29.7  MCHC 32.7  RDW 13.2  Neutrophil % 77.9  Lymphocyte % 9.8  Monocyte % 11.0  Eosinophil % 1.0  Basophil % 0.3  Neutrophil #  7.4  Lymphocyte #  0.9  Monocyte # 1.0  Eosinophil # 0.1  Basophil # 0.0 (Result(s) reported on 16 Jul 2014 at 02:55AM.)   EKG:  EKG Interp. by me   Interpretation sinus tachycardia, 108, frequent PACs, nonspecific st/t changes   Radiology Results: XRay:    22-Oct-15 19:21, Chest PA and Lateral  Chest PA and Lateral   REASON FOR EXAM:    rib fractures, chest pain  COMMENTS:       PROCEDURE: DXR - DXR CHEST PA (OR AP) AND LATERAL  - Jul 15 2014  7:21PM     CLINICAL DATA:  81 year old male with weakness since hospital  discharge 2 days ago. Chest pain, broken ribs. Initial encounter.    EXAM:  CHEST  2 VIEW    COMPARISON:  07/12/2014 and earlier.    FINDINGS:  Improved lung volumes and bibasilar ventilation. Small bilateral  pleural effusions and confluent bibasilar opacity which most  resembles atelectasis. Normal cardiac size and mediastinal contours.  Visualized tracheal air column is within normal limits. No  pneumothorax or pulmonary edema. Stable visualized osseous  structures.     IMPRESSION:  Small bilateral pleural effusions and bibasilar atelectasis.  Ventilation improved since chest radiographs on 07/12/14.      Electronically Signed    By: Lars Pinks M.D.    On:  07/15/2014 20:34     Verified By: Gwenyth Bender. HALL, M.D.,    Sulfa drugs: Rash  Motrin: Blisters  Vital Signs/Nurse's Notes: **Vital Signs.:  23-Oct-15 07:55  Pulse Pulse 66  Respirations Respirations 18  Systolic BP Systolic BP 464  Diastolic BP (mmHg) Diastolic BP (mmHg) 63  Mean BP 81  Pulse Ox % Pulse Ox % 91  Pulse Ox Activity Level  At rest  Oxygen Delivery Room Air/ 21 %    Impression 81 year old male with history of DM2, HTN, HLD, hypothyroidism, and BPH who was recently admitted to [facility] from 10/16-10/20 secondary to a syncopal episode sustained while on a ladder in which he occured multiple broken ribs and a hemopneumothorax. He returned to [facility] on 07/15/14 with increased SOB, palpitations, and fast HR all day. In the ED his HR was found to be in the 160s. He was found to be in SVT. He did not receive any adenosine. He was started on metoprolol 25 mg bid. HR currently 70s. We are consulted for further evaluation.  1. Arrhythmia: -Presented to [facility] with HR in the 160s - I cannot find documentation of this, 12 lead shows sinus tach with frequent PACs -Currently in NSR in the 70s resting comfortably -Continue metoprolol 25 mg bid -CardioNet monitor as outpatient  2. History of syncopal episode: -It is unclear what truly caused this episode and unfortunately we will unlikely never know -He is on medications that could cause some orthostatic hypotension - may be best to limit these to a minimum -Gentle increase in fluids to minimize dehydration  3. Demand ischemia: -Likely in the setting of his tachycardia -He has not had an ischemic evaluation previously -Plan for outpatient Lexiscan Myoview -Echo done prior admission with nl LV function, mild aortic stenosis, impaired LV relaxation  -Continue aspirin, b-blocker, statin  4. Mild aortic stenosis: -Outpatient follow up  5. DM2 -Per IM   Electronic Signatures for Addendum Section:  Ida Rogue (MD) (Signed Addendum  27-Oct-15 07:53)  Patient was discharged before he could be evaluated by myself. Will arrange outpt followup.   Electronic Signatures: Rise Mu (PA-C)  (Signed 23-Oct-15 12:02)  Authored: General Aspect/Present Illness, History and Physical Exam, Review of System, Family & Social History, Past Medical History, Home Medications, Labs, EKG , Radiology, Allergies, Vital Signs/Nurse's Notes, Impression/Plan Ida Rogue (MD)  (Signed 27-Oct-15 07:53)  Co-Signer: General Aspect/Present Illness, History and Physical Exam, Review of System, Family & Social History, Past Medical History, Home Medications, Labs, EKG , Radiology, Allergies, Vital Signs/Nurse's Notes, Impression/Plan   Last Updated: 27-Oct-15 07:53 by Ida Rogue (MD)

## 2015-01-15 NOTE — Consult Note (Signed)
PATIENT NAME:  Aaron Becker, Aaron Becker MR#:  [id number] DATE OF BIRTH:  1930-09-02  DATE OF CONSULTATION:  07/10/2014  CONSULTING PHYSICIAN:  Tana Conch. Leslye Peer, MD.  PRIMARY CARE PHYSICIAN: Elveria Rising. Damita Dunnings, MD.   REQUESTING PHYSICIAN: Dr. Burt Knack.   REASON FOR CONSULTATION: Syncope.   HISTORY OF PRESENT ILLNESS: This is an 81 year old man who was up on a ladder cutting down tree limbs. He states that he had no symptoms prior to passing out. He thinks he passed out while he was up on the ladder and the next thing he knew, he woke up on the ground. No family was there to witness this. His wife, who was inside, did not even know he was up on a ladder. He got up and sat on the porch and he felt it was hard to breathe. He was jittery. His sugar and blood pressure was up. He took an extra metformin and glipizide and then he could not stand up. He was able to wash the blood off his arms and head and put some Band-Aids on his arms. He decided to come to the hospital for further evaluation. In the ER, he was found to have a few rib fractures and a small left hemopneumothorax. He was admitted to the surgical service. Hospitalist services were contacted for further evaluation of syncope and possible causes. Of note, the patient was recently started on Flomax on Thursday, took his last first pill and then Friday was cutting down the tree branches. Unfortunately, no orthostatics were done when he came into the hospital. At this point, the patient has been on IV fluids for a period of time and I am not sure if I will be able to get good orthostatics. The patient's sugar after the episode was high, but that could be a stress response from the episode. The patient feels well now. Only complains of pain in the left rib area. Otherwise, offers no complaints.   PAST MEDICAL HISTORY: Diabetes, hypertension, BPH, hyperlipidemia, hypothyroidism.   PAST SURGICAL HISTORY: Hernia, 3 back surgeries, cholecystectomy.   ALLERGIES:  SULFA.   MEDICATIONS: Include Benadryl 1 tablet at bedtime, benazepril 10 mg in the morning, clotrimazole/betamethasone 0.5%/1% applied to affected area twice a day, Centrum Silver 1 tablet daily, fish oil 1 capsule daily, folic acid 1 mg daily, glyburide/metformin 2.5/500 one tablet in the morning, glyburide/metformin 2.5/500 two tablets in the evening and glyburide/metformin 2.5/500 half tablet prior to bed, hydrochlorothiazide 12.5 mg daily, levothyroxine 75 mcg daily, pravastatin 40 mg 2 tablets at bedtime. Recently started on Flomax 0.4 mg daily, trigosamine tablet 1 tablet twice a day, vitamin C 1 tablet daily, zinc gluconate 1 tablet once a day as needed.   SOCIAL HISTORY: No smoking. No alcohol. No drug use. Used to work in the [employer] for 45 years.   FAMILY HISTORY: Father died at 76 of liver cancer. Mother died of complications at childbirth.   REVIEW OF SYSTEMS:  CONSTITUTIONAL: No fever, chills or sweats.  EYES: No blurry vision.  EARS, NOSE, MOUTH AND THROAT: No hearing loss. No sore throat. No difficulty swallowing.  CARDIOVASCULAR: No chest pain. No palpitations.  RESPIRATORY: Trouble getting a deep breath with the broken ribs.  GASTROINTESTINAL: No nausea. No vomiting. No abdominal pain. No diarrhea.  GENITOURINARY: No burning on urination. No hematuria.  MUSCULOSKELETAL: Positive for rib pain.  INTEGUMENTARY: Positive for bruising and hematoma with the fall.  NEUROLOGIC: No prior history of passing out.  PSYCHIATRIC: No anxiety or depression.  ENDOCRINE:  Positive for hypothyroidism.  HEMATOLOGIC AND LYMPHATIC: No anemia.   PHYSICAL EXAMINATION: VITAL SIGNS: Temperature 98.3, pulse 80, respirations 18, blood pressure 111/57, pulse oximetry 96% on 2 liters. Telemetry monitoring shows normal sinus rhythm.  GENERAL: No respiratory distress.  HEENT: Eyes: Conjunctivae and lids normal. Pupils equal, round and reactive to light. Extraocular muscles intact. No nystagmus.  Nasal mucosa: No erythema. Throat: No erythema. No exudate seen. Lips and gums: No lesions.  NECK: No JVD. No bruits. No lymphadenopathy. No thyromegaly. No thyroid nodules palpated.  LUNGS: Clear to auscultation. No use of accessory muscles to breathe. No rhonchi, rales or wheeze heard.  CARDIOVASCULAR: S1, S2 normal. No gallops, rubs or murmurs heard. Carotid upstroke 2+ bilaterally. No bruits. Dorsalis and pedis pulses 1+ bilaterally. Trace edema of the lower extremity.  ABDOMEN: Soft, nontender. No organomegaly/splenomegaly. Normoactive bowel sounds. No masses felt.  LYMPHATIC: No lymph nodes in the neck.  MUSCULOSKELETAL: Trace edema. No clubbing. No cyanosis.  SKIN: No ulcers or lesions. Bruising on bilateral arms. Skin tears on the left arm. Large hematoma on the lower left back. Some scabs seen on the top of the head in 2 areas.  PSYCHIATRIC: The patient is alert, oriented to person, place and time.  NEUROLOGIC: Cranial nerves 2 through 12 grossly intact. Deep tendon reflexes 1+ bilateral lower extremities.   LABORATORY DATA: On presentation. PT, INR and PTT normal range. Troponin negative. Glucose 295, BUN 22, creatinine 1.45, sodium 140, potassium 4.1, chloride 104, CO2 of 23, calcium 8.8. White blood cell count 17.4, Hemoglobin  12.9 and platelet count of 180,000. Urinalysis: Trace ketones and 500 mg/dL of glucose.  DIAGNOSTIC DATA: CT scan of the chest, abdomen and pelvis showed left ribs and scapular fracture. A small left hemopneumothorax. CT scan of the cervical spine and head showed brain atrophy, advanced cervical spondylolysis and that also showed the posterior aspects of the left 2d and 3rd ribs showing acute fractures.   ASSESSMENT AND PLAN: 1. Syncope with no presenting factors or prodrome, unclear etiology. Could be related to the Flomax that was started the day before. Unfortunately, orthostatics were not checked on presentation. Will check now, but he has been hydrated  overnight so I may not get the orthostatics. So far, no arrhythmias on telemetry. We will get an echocardiogram and carotid ultrasound. Continue IV fluid hydration. Less likely hypoglycemia since his sugars were elevated after the fall, but that could be a stress response from the fall. I will continue to monitor sugars closely while here.  2. Small hemopneumothorax and multiple rib fractures. Watch the respiratory status closely.  3. Large hematoma on the left back, bruising on the arms and scrapes on the arms. Continue to watch and local wound care.  4. Head trauma from falling from ladder height. He did hit his head and was bleeding from the head. Watch neurological status closely. If any changes in neurologic status, must repeat a CT scan of the head.  5. Diabetes. Continue glipizide and metformin. Continue to watch sugar. Cover with sliding scale.  6. Hypertension. Blood pressure is stable. Holding hydrochlorothiazide at this point. Will check orthostatic vital signs.  7. Hyperlipidemia, on pravastatin.  8. Hypothyroidism, on levothyroxine.  9. The patient is very lucky. Things could have been lot worse than what they are with this large trauma, hitting his head, lower back and ribs. We will have to watch status closely over the next couple of days to see how he progresses.  10. The patient is a  full code. 11. The case discussed in front of family at the bedside.  TIME SPENT ON CONSULTATION: Today, 50 minutes.      ____________________________ Tana Conch. Leslye Peer, MD rjw:TT D: 07/10/2014 14:16:28 ET T: 07/10/2014 15:27:39 ET JOB#: [id number]  cc: Tana Conch. Leslye Peer, MD, <Dictator> Elveria Rising. Damita Dunnings, MD Dr. Lenore Cordia MD ELECTRONICALLY SIGNED 07/11/2014 12:52

## 2015-01-15 NOTE — H&P (Signed)
Subjective/Chief Complaint fall from tree   History of Present Illness 81 y/o male who is very active fell from ladder about 10 feet up while trimming a tree branch, brief LOC, was able to ambulate back to his house.  Then brought to ER.  Accident occured at 1 pm.  CT scans done at 7 pm.  Hemodynamically stable.  Incidentally took first dise of flomax this am.  No previous history of syncope, no smoking or ETOH, no cardiac history, no loss of bowel or bladder function.   Past History hypertension BPH hyperlipedemia. type 2 DM.   Past Medical Health Hypertension, Diabetes Mellitus   Code Status Full Code   Past Med/Surgical Hx:  Prostate BPH:   Hypercholesterolemia:   HTN:   Diabetes Mellitus, Type II (NIDD):   Right leg vein stripping:   Gall Bladder Surgery:   Back Surgery X 3:   ALLERGIES:  Sulfa drugs: Rash  Motrin: Blisters   Other Allergies none   HOME MEDICATIONS: Medication Instructions Status  benazepril 10 mg oral tablet 1 tab(s) orally once a day (in the morning) Active  glyBURIDE-metformin 2.5 mg-500 mg oral tablet 1 tab(s) orally once a day (in the morning) Active  hydrochlorothiazide 12.5 mg oral capsule 1 cap(s) orally once a day (in the morning) Active  levothyroxine 75 mcg (0.075 mg) oral tablet 1 tab(s) orally once a day (in the morning) Active  betamethasone-clotrimazole topical 0.05%-1% topical cream Apply topically to affected area 2 times a day, As Needed for irritation Active  glyBURIDE-metFORMIN 2.5 mg-500 mg oral tablet 2 tab(s) orally once a day (in the evening) Active  glyBURIDE-metFORMIN 2.5 mg-500 mg oral tablet 0.5 tab(s) orally once a day (at bedtime), As Needed for high blood sugar Active  pravastatin 40 mg oral tablet 2 tab(s) orally once a day (at bedtime) Active  Trigosamine tablet 1 tab(s) orally 2 times a day Active  Centrum Silver Therapeutic Multiple Vitamins with Minerals oral tablet 1 tab(s) orally once a day Active  Fish Oil - oral  capsule 1 cap(s) orally once a day Active  folic acid 1 tab(s) orally once a day Active  Vitamin C 1 tab(s) orally once a day Active  Benadryl 1 tab(s) orally once a day (at bedtime) Active  zinc gluconate 1 tab(s) orally once a day, As Needed for supplement Active  tamsulosin 0.4 mg oral capsule 1 cap(s) orally once a day Active   Family and Social History:  Family History Non-Contributory   Social History negative tobacco, negative ETOH, negative Illicit drugs   Place of Living Home   Review of Systems:  Subjective/Chief Complaint see above.   Physical Exam:  GEN no acute distress, thin, temp 98.4 bp 187/90 rr 18/min, p96   HEENT pale conjunctivae, PERRL, dry oral mucosa, good dentition   NECK supple  No masses  thyroid not tender  trachea midline   RESP normal resp effort  clear BS   CARD regular rate  no murmur  no thrills  no carotid bruits  No LE edema   ABD denies tenderness  positive Flank Tenderness  no hernia  soft  RUQ scar, large left lower flank soft tissue hematoma.   LYMPH negative neck   EXTR negative cyanosis/clubbing, negative edema, some tenderness along right lateral thigh.   SKIN normal to palpation   NEURO cranial nerves intact, negative tremor, follows commands   PSYCH A+O to time, place, person, good insight   Lab Results: Routine Chem:  16-Oct-15 16:06  Glucose, Serum  295  BUN  22  Creatinine (comp)  1.45  Sodium, Serum 140  Potassium, Serum 4.1  Chloride, Serum 104  CO2, Serum 23  Calcium (Total), Serum 8.8  Anion Gap 13  Osmolality (calc) 294  eGFR (African American) >60  eGFR (Non-African American)  50 (eGFR values <21m/min/1.73 m2 may be an indication of chronic kidney disease (CKD). Calculated eGFR, using the MRDR Study equation, is useful in  patients with stable renal function. The eGFR calculation will not be reliable in acutely ill patients when serum creatinine is changing rapidly. It is not useful in patients on  dialysis. The eGFR calculation may not be applicable to patients at the low and high extremes of body sizes, pregnant women, and vetetarians.)  Cardiac:  16-Oct-15 16:06   Troponin I < 0.02 (0.00-0.05 0.05 ng/mL or less: NEGATIVE  Repeat testing in 3-6 hrs  if clinically indicated. >0.05 ng/mL: POTENTIAL  MYOCARDIAL INJURY. Repeat  testing in 3-6 hrs if  clinically indicated. NOTE: An increase or decrease  of 30% or more on serial  testing suggests a  clinically important change)  Routine UA:  16-Oct-15 19:54   Color (UA) Yellow  Clarity (UA) Clear  Glucose (UA) >=500  Bilirubin (UA) Negative  Ketones (UA) Trace  Specific Gravity (UA) 1.022  Blood (UA) 1+  pH (UA) 5.0  Protein (UA) 100 mg/dL  Nitrite (UA) Negative  Leukocyte Esterase (UA) Negative (Result(s) reported on 09 Jul 2014 at 08:51PM.)  RBC (UA) <1 /HPF  WBC (UA) <1 /HPF  Bacteria (UA) NONE SEEN  Epithelial Cells (UA) NONE SEEN  Mucous (UA) PRESENT (Result(s) reported on 09 Jul 2014 at 08:51PM.)  Routine Coag:  16-Oct-15 16:06   Prothrombin 13.1  INR 1.0 (INR reference interval applies to patients on anticoagulant therapy. A single INR therapeutic range for coumarins is not optimal for all indications; however, the suggested range for most indications is 2.0 - 3.0. Exceptions to the INR Reference Range may include: Prosthetic heart valves, acute myocardial infarction, prevention of myocardial infarction, and combinations of aspirin and anticoagulant. The need for a higher or lower target INR must be assessed individually. Reference: The Pharmacology and Management of the Vitamin K  antagonists: the seventh ACCP Conference on Antithrombotic and Thrombolytic Therapy. CUYQIH.4742Sept:126 (3suppl): [id number] A HCT value >55% may artifactually increase the PT.  In one study,  the increase was an average of 25%. Reference:  "Effect on Routine and Special Coagulation Testing Values of Citrate Anticoagulant  Adjustment in Patients with High HCT Values." American Journal of Clinical Pathology 2006;126:400-405.)  Activated PTT (APTT) 26.1 (A HCT value >55% may artifactually increase the APTT. In one study, the increase was an average of 19%. Reference: "Effect on Routine and Special Coagulation Testing Values of Citrate Anticoagulant Adjustment in Patients with High HCT Values." American Journal of Clinical Pathology 2006;126:400-405.)  Routine Hem:  16-Oct-15 16:06   WBC (CBC)  17.4  RBC (CBC)  4.36  Hemoglobin (CBC)  12.9  Hematocrit (CBC) 40.5  Platelet Count (CBC) 180  MCV 93  MCH 29.6  MCHC 32.0  RDW 13.7  Neutrophil % 91.8  Lymphocyte % 1.6  Monocyte % 6.4  Eosinophil % 0.0  Basophil % 0.2  Neutrophil #  15.9  Lymphocyte #  0.3  Monocyte #  1.1  Eosinophil # 0.0  Basophil # 0.0 (Result(s) reported on 09 Jul 2014 at 04:29PM.)   Radiology Results: LabUnknown:    16-Oct-15 19:22, CT Cervical Spine Without  Contrast  PACS Image    16-Oct-15 19:22, CT Chest, Abd, and Pelvis With Contrast  PACS Image    16-Oct-15 19:22, CT Head Without Contrast  PACS Image  CT:    16-Oct-15 19:22, CT Cervical Spine Without Contrast  CT Cervical Spine Without Contrast  REASON FOR EXAM:    fall  COMMENTS:       PROCEDURE: CT  - CT CERVICAL SPINE WO  - Jul 09 2014  7:22PM     CLINICAL DATA:  [place] from ladder.    EXAM:  CT HEAD WITHOUT CONTRAST    CT CERVICAL SPINE WITHOUT CONTRAST    TECHNIQUE:  Multidetector CT imaging of the head and cervical spine was  performed following the standard protocol without intravenous  contrast. Multiplanar CT image reconstructions of the cervical spine  were also generated.    COMPARISON:  None.    FINDINGS:  CT HEAD FINDINGS    Prominence of the sulci and ventricles identified compatible with  brain atrophy. There is no evidence for acute brain infarct,  intracranial hemorrhage or mass. The paranasal sinuses appear clear.  The mastoid air cells  are also clear. The calvarium appears intact.    CT CERVICAL SPINE FINDINGS  Normal alignment of the cervical spine. The vertebral body heights  are well preserved. The facet joints are all aligned. Marked multi  level disc space narrowing and ventral endplate spurring is noted.  This is most advanced at C4-5 and C7-T1. Bilateral facet  degenerative change is noted within the upper cervical spine. There  is acute fracture involving the posterior aspect of the left second  and third ribs near the costovertebral junction, image 84/series a  and image 94/series 8. Small left apical pneumothorax is noted,  image number 94/series 5.     IMPRESSION:  1. No acute intracranial abnormalities.  2. Brain atrophy.  3. Advanced cervical spondylosis.  4. Acute fractures involve the posterior aspects of the left second  and third ribs.      Electronically Signed    By: Kerby Moors M.D.    On: 07/09/2014 20:03         Verified By: Angelita Ingles, M.D.,    16-Oct-15 19:22, CT Chest, Abd, and Pelvis With Contrast  CT Chest, Abd, and Pelvis With Contrast  REASON FOR EXAM:    (1) TRAUMA -stat; (2) TRAUMA - fall  COMMENTS:       PROCEDURE: CT  - CT CHEST ABDOMEN AND PELVIS W  - Jul 09 2014  7:22PM     CLINICAL DATA:  Initial evaluation for trauma, fell from ladder 2  p.m. with pain left shoulder and back, all patient indicates that he  lost consciousness, also laceration to head    EXAM:  CT CHEST, ABDOMEN, AND PELVIS WITH CONTRAST    TECHNIQUE:  Multidetector CT imaging of the chest, abdomen and pelvis was  performed following the standard protocolduring bolus  administration of intravenous contrast.    CONTRAST:  100 mL Isovue-300    COMPARISON:  None.    FINDINGS:  CT CHEST FINDINGS    There is bilateral dependent atelectasis. There is a small left  pneumothorax measuring a maximum diameter 15 mm anteriorly. There is  a small amount left pleural fluid likely representing  hemothorax.  There is a mildly displaced left fourth rib fracture. There is a  nondisplaced left fifth rib fracture. There is a nondisplaced  fracture involving the anterior inferior tip of the left  scapula.  Thoracic inlet is normal. No significant adenopathy. Mediastinal  contents normal. Calcification of the aorta.    CT ABDOMEN AND PELVIS FINDINGS    Liver is normal. Gallbladder is not identified. Spleen is normal.  Pancreas is normal. Adrenal glands are normal. Right kidney  demonstrates an exophytic 1.5 cm lower pole cyst but is otherwise  normal.    Small bowel and colon are normal except for distal colon  diverticulosis. No evidence of diverticulitis. Stomach is normal.  There is no free fluid in the abdomen or pelvis.    Bladder is distended. Prostate is prominent at about 6.5 cm. There  are no acute musculoskeletal findings in the abdomen or pelvis.     IMPRESSION:  Left rib and scapular fractures. Small left hemopneumothorax.  Critical Value/emergent results were called by telephone at the time  of interpretation on This token no longer exists - it was removed  from [employer] by a Corporate treasurer. Please update your  templates/macros accordingly. at This token no longer exists - it  was removed from [employer] by a Corporate treasurer. Please update  your templates/macros accordingly. to Dr. This token no longer  exists - it was removed from [employer] by a Corporate treasurer.  Please update your templates/macros accordingly., who verbally  acknowledged these results.    Electronically Signed    By: Skipper Cliche M.D.    On: 07/09/2014 20:03         Verified By: Rachael Fee, M.D.,    16-Oct-15 19:22, CT Head Without Contrast  CT Head Without Contrast  REASON FOR EXAM:    syncope and fall  COMMENTS:       PROCEDURE: CT  - CT HEAD WITHOUT CONTRAST  - Jul 09 2014  7:22PM     CLINICAL DATA:  [place] from ladder.    EXAM:  CT HEAD WITHOUT CONTRAST    CT  CERVICAL SPINE WITHOUT CONTRAST    TECHNIQUE:  Multidetector CT imaging of the head and cervical spine was  performed following the standard protocol without intravenous  contrast. Multiplanar CT image reconstructions of the cervical spine  were also generated.    COMPARISON:  None.    FINDINGS:  CT HEAD FINDINGS    Prominence of the sulci and ventricles identified compatible with  brain atrophy. There is no evidence for acute brain infarct,  intracranial hemorrhage or mass. The paranasal sinuses appear clear.  The mastoid air cells are also clear.The calvarium appears intact.    CT CERVICAL SPINE FINDINGS  Normal alignment of the cervical spine. The vertebral body heights  are well preserved. The facet joints are all aligned. Marked multi  level disc space narrowing and ventral endplate spurring is noted.  This is most advanced at C4-5 and C7-T1. Bilateral facet  degenerative change is noted within the upper cervical spine. There  is acute fracture involving the posterior aspect of the left second  and third ribs near the costovertebral junction, image 84/series a  and image 94/series 8. Small left apical pneumothorax is noted,  image number 94/series 5.     IMPRESSION:  1. No acute intracranial abnormalities.  2. Brain atrophy.  3. Advanced cervical spondylosis.  4. Acute fractures involve the posterior aspects of the left second  and third ribs.      Electronically Signed    By: Kerby Moors M.D.    On: 07/09/2014 20:03         Verified By: Angelita Ingles, M.D.,  Assessment/Admission Diagnosis 81 y/o male with syncopal episdoe resulting in fall from height and traumatic very small left ptx and very small left hemothorax, several left sided posterior rib fractures and tip of scapula fracture. No signs of respiratory distress or hypoxia.   Plan Will repeat PA/LAT cxr in am and if condition deteriorates will need chest tube. supplemental oxygen SSI carotid  duplex in am to r/o carotid stenosis as etiology of syncope. remote telemetry to look for cardiac etiology.   Electronic Signatures: Sherri Rad (MD)  (Signed 16-Oct-15 21:43)  Authored: CHIEF COMPLAINT and HISTORY, PAST MEDICAL/SURGIAL HISTORY, ALLERGIES, Other Allergies, HOME MEDICATIONS, FAMILY AND SOCIAL HISTORY, REVIEW OF SYSTEMS, PHYSICAL EXAM, LABS, Radiology, ASSESSMENT AND PLAN   Last Updated: 16-Oct-15 21:43 by Sherri Rad (MD)

## 2015-01-15 NOTE — Discharge Summary (Signed)
PATIENT NAME:  Aaron Becker, Aaron Becker MR#:  [id number] DATE OF BIRTH:  Aug 20, 1930  ADMITTING DIAGNOSIS:  Supraventricular tachycardia.  DISCHARGE DIAGNOSES:  1.  Paroxysmal supraventricular tachycardia, in sinus rhythm now.  2.  Elevated troponin, likely demand ischemia.  3.  Renal insufficiency, resolved. 4.  Hypertension.  5.  History of diabetes mellitus, benign prostatic hypertrophy, hyperlipidemia, hypothyroidism, recent right rib fracture after a fall, as well as syncope.  6.  Mild aortic stenosis and left ventricular hypertrophy on echocardiogram.   DISCHARGE CONDITION: Stable.   DISCHARGE MEDICATIONS: The patient is to continue Synthroid 75 mcg p.o. daily, betamethasone, clotrimazole topical cream twice daily as needed, glyburide, metformin 2.5/500 mg half tablet once at bedtime as needed, Pravachol 40 mg p.o. 2 tablets at bedtime,  trigosamine  1 tablet twice daily, unknown strength. Multivitamin Centrum Silver once daily, fish oil,  unknown dose, 1 capsule once daily, folic acid, unknown dose, 1 tablet once daily, vitamine C, unknown dose, 1 tablet once daily, potassium gluconate, known dose, 1 tablet once daily as needed, finasteride 5 mg p.o. once daily, acetaminophen/hydrocodone 325 mg/5 mg 1 tablet every 4 hours as needed, docusate sodium 100 mg p.o. daily, Benadryl 25 mg p.o. at bedtime and glyburide and metformin 2.5/500 mg 1 tablet in the morning and glyburide, metformin 2.5 mg/500 two tablets in the evening, metoprolol tartrate 25 mg p.o. twice daily; this is a new medication. The patient is not to take benazepril or hydrochlorothiazide.   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  HOME OXYGEN: None.   DIET: Two-gram salt, low-fat, low-cholesterol, carbohydrate-controlled diet, regular consistency.   ACTIVITY LIMITATIONS: As tolerated.   FOLLOWUP APPOINTMENT:  With Dr. Fletcher Anon in 2 days after discharge.  Dr. Damita Dunnings in 2 days after discharge.   CONSULTANTS: Care management,  social work.   RADIOLOGIC STUDIES: Chest x-ray, PA and lateral, 07/10/2014, showed nondisplaced left posterior 4th rib fracture. There are bilateral trace pleural effusions and bibasilar atelectasis according to the radiologist. Chest x-ray done on 07/11/2014, portable single view, showed a low lung volumes, and small pleural effusions, stable left rib fracture. Chest, PA and lateral, 07/12/2014, revealed interval increase in small left-sided effusion with worsening of left mid and lower lobe opacities, likely atelectasis superimposed on pulmonary contusion. Unchanged minimally displaced fracture involving the posterior lateral aspect of the left 4th rib.  No definite pneumothorax. Pulmonary venous congestion without frank evidence of edema, according to radiologist. Chest x-ray PA and lateral, 07/15/2014, showed small bilateral pleural effusions and bibasilar atelectasis, ventilation improved since chest radiographs on 07/12/2014.  Echocardiogram, 07/11/2014, showed left ventricular ejection fraction by visual estimation 60% to 65%, normal global left ventricular systolic function, normal right ventricular size and systolic function, impaired relaxation pattern of left ventricular diastolic filling, mild aortic regurgitation, mild dilatation of the ascending aorta and aortic root. Normal right ventricular systolic pressure.  HOSPITAL COURSE: The patient is an 81 year old Caucasian male with past medical history significant for history of recent admission to the hospital after he fell down off a ladder due to syncopal episode, who presented back to the hospital with complaints of not feeling well and having heart rate. Please refer to Dr. Marshia Ly admission done on 07/15/2014. On arrival to the hospital, the patient's temperature was 98.4, pulse was 89. Respiratory rate was 20, blood pressure 129/63. Saturation was 94% on room air. Physical exam revealed diffuse breath sounds bilaterally and no other  abnormalities were found.   The patient's telemetry monitoring showed brief episodes  of SVT.    LABORATORY DATA: Done on arrival to the Emergency Room, 07/15/2014, showed a glucose level of 202, BUN and creatinine were 59 and 1.46, sodium 131, potassium 3.3, otherwise BMP was unremarkable. The patient's glucose level was 202, otherwise BMP was unremarkable except for a calcium level of 8.1. Troponin was elevated at 0.09 on the first set, 0.08 on the second, and 0.07 on the third. The patient's TSH was normal at 0.805. White blood cell count was elevated to 13.4, hemoglobin was 11.8, and platelet count was 337,000. Absolute neutrophil count was elevated at 11.0.   EKG showed sinus tachycardia at 109 beats a minute, premature atrial complexes, otherwise normal EKG.  The patient was admitted to the hospital for further evaluation. Because of his SVT, he was consulted by Mr.  Christell Faith, PA for Dr. Fletcher Anon.  Mr. Idolina Primer saw patient in consultation and felt that patient has arrhythmia on presentation to [facility]: [facility] with heart rate of 160. He recommended to continue metoprolol and get a CardioNet monitor as an outpatient. In regards to his history of syncopal episode, it was unclear according to Mr. Idolina Primer if it was gradually caused by episode of the arrhythmia episodes or some other reason.  He felt that the patient is on medication that could cause some episodes of hypertension and felt that a vest should not be used at this time and recommended to increase gently increase his fluids to minimize dehydration.  In regard to elevated troponin, it was felt to be demand ischemia, likely  due to tachycardia, but according to Mr. Idolina Primer, the patient has not had ischemic evaluation in the past, so he thought that patient should benefit from [facility] stress test as outpatient. Echocardiogram, according to him, done on prior admission showed normal left ventricular function, mild aortic stenosis as well as impaired  left ventricular relaxation.  He recommended to continue aspirin, beta blockers, as well as statin. For mild aortic stenosis, he recommended outpatient followup.   The patient was rehydrated and his kidney function normalized. On the day of discharge, 07/16/2014, the patient's creatinine was 1.27 and sodium also improved to 132. It was felt that the patient's hyponatremia, as well as mild renal insufficiency, could have been related to his diuretic medications as well as ACE inhibitor, benazepril. For this reason, benazepril as well as hydrochlorothiazide were placed on hold.  The patient was initiated in on beta blockers due to paroxysmal atrial fibrillation to keep the patient in sinus rhythm.   In regards to elevated troponin, as mentioned above, the patient is to continue metoprolol as well as Pravachol. He is to follow up with cardiology for stress test as outpatient. For his chronic medical problems such as diabetes, hypertension, hyperlipidemia, hypothyroidism, recent rib fracture, the patient is to continue his outpatient management. His blood pressure medications; however, changed as mentioned above. The patient is being discharged in stable condition with the above-mentioned medications and followup.   On the day of discharge, temperature was 98.7, pulse was 70, respiration rate was 19, blood pressure 151/82, saturation was 94% on room air at rest.   TIME SPENT: 40 minutes.     ____________________________ Theodoro Grist, MD rv:LT D: 07/17/2014 21:39:00 ET T: 07/18/2014 17:14:01 ET JOB#: [id number]  cc: Elveria Rising. Damita Dunnings, MD Mertie Clause. Fletcher Anon, MD Theodoro Grist, MD, <Dictator>      [place] MD ELECTRONICALLY SIGNED 07/23/2014 13:20

## 2015-01-15 NOTE — H&P (Signed)
PATIENT NAME:  Aaron Becker, Aaron Becker MR#:  [id number] DATE OF BIRTH:  May 11, 1930  DATE OF ADMISSION:  07/15/2014  PRIMARY CARE PHYSICIAN:  Elsie Stain, MD   CHIEF COMPLAINT: Not feeling well and fast heart rate.   HISTORY OF PRESENT ILLNESS: This is an 81 year old man who was recently in the hospital after he was up on a ladder and had a syncopal episode and ended up on the ground and fractured 3 ribs, had a hemopneumothorax which had resolved and he was treated symptomatically for rib fractures with pain medications and was discharged home on 07/13/2014. He came back the evening of the 22nd, had fast heart rate all day from the morning, heart rate in the 160s. His blood pressure was up he is feeling a little short of breath still having 5/10 pain in his ribs. He is weak, not drinking very well, also has some constipation. In the ER, he was found to initially have sinus tachycardia, but then had a bout of SVT. Hospitalist services were contacted for further evaluation.   PAST MEDICAL HISTORY: Diabetes, hypertension, benign prostatic hypertrophy, hyperlipidemia, hypothyroidism.   PAST SURGICAL HISTORY: Hernia, 3 back surgeries, cholecystectomy.   ALLERGIES: SULFA, MOTRIN.   MEDICATIONS: Include Norco 5/325 1 tablet every 4 hours as needed for moderate pain, Benadryl 25 mg at bedtime, Benazepril 10 mg daily, betamethasone Clotrimazole 0.05% /1% cream apply twice a day, Centrum Silver 1 tablet daily, Colace 100 mg daily, finasteride 5 mg daily, fish oil 1 capsule daily, folic acid 1 mg daily, glyburide/metformin 2.5/500, 1 tablet in the morning, 2 tablets in the evening, half tablet at bedtime as needed, hydrochlorothiazide 12.5 mg daily,  levothyroxine 75 mcg daily, pravastatin 40 mg 2 tablets daily, trigosamine tablet 1 tablet twice a day, vitamin C 1 tablet daily, zinc gluconate 1 tablet daily as needed.   SOCIAL HISTORY: No smoking. No alcohol. No drug use. Used to work in [employer] for 45 years.    FAMILY HISTORY: Father died at 51 of liver cancer. Mother died at complications of childbirth.   REVIEW OF SYSTEMS:  CONSTITUTIONAL: Positive for weakness. No fever, chills, or sweats. No weight loss. No weight gain.  EYES: Does wear glasses.  EARS, NOSE, MOUTH AND THROAT: Wears hearing aids. No sore throat. No difficulty swallowing.  CARDIOVASCULAR: No chest pain. No palpitations.  RESPIRATORY: Positive for shortness of breath with rib pain. No cough. No sputum. No hemoptysis.  GASTROINTESTINAL: Positive for nausea. Positive for constipation, slight abdominal discomfort.  GENITOURINARY: No burning on urination or hematuria.  MUSCULOSKELETAL: No joint pain or muscle pain besides the ribs.  INTEGUMENT: Hematoma on the back. Skin tears on the arm.  PSYCHIATRIC: No anxiety or depression.  ENDOCRINE: Positive for hypothyroidism.  HEMATOLOGIC AND LYMPHATIC: No anemia.   PHYSICAL EXAMINATION:  VITAL SIGNS: Temperature 98.4, pulse 89, respirations 20, blood pressure 129/63, pulse oximetry 94% on room air.  GENERAL: No respiratory distress.  EYES: Conjunctivae and lids normal. Pupils equal, round, and reactive to light. Extraocular muscles intact. No nystagmus.  EARS, NOSE, MOUTH AND THROAT: Tympanic membranes: No erythema. Nasal mucosa: No erythema. Throat: No erythema. No exudate seen. Lips and gums: No lesions.  NECK: No JVD. No bruits. No lymphadenopathy. No thyromegaly. No thyroid nodules palpated.  RESPIRATORY: Lungs decreased breath sounds bilaterally. No rhonchi, rales, or wheeze heard. Clear to auscultation.  CARDIOVASCULAR: S1, S2 normal. No gallops, rubs, or murmurs heard. Carotid upstroke 2+ bilaterally. No bruits noted. Pulses are 2+ bilaterally. No edema of  the lower extremity.  ABDOMEN: Soft, nontender. No organomegaly/splenomegaly. Normoactive bowel sounds. No masses felt.  LYMPHATIC: No lymph nodes in the neck.  MUSCULOSKELETAL: No clubbing, edema, or cyanosis.  SKIN: Positive  bruising on the arms. skin tears  healing left arm. Large hematoma left back, healing abrasions on the head.  PSYCHIATRIC: The patient is alert, oriented to person, place, and time.  NEUROLOGIC: Cranial nerves II through XII grossly intact. Deep tendon reflexes half plus bilateral lower extremity.   LABORATORY AND RADIOLOGICAL DATA: Telemetry monitoring showed brief episode of SVT. Troponin borderline at 0.09. Glucose 202, BUN 59, creatinine 1.46, sodium 131, potassium 3.3, chloride 95, CO2 26, calcium 8.1. White blood cell count 13.4, hemoglobin and hematocrit 11.8 and 35.7, platelet count of 337,000. Chest x-ray small bilateral pleural effusions and bibasilar atelectasis. Ventilation improved since the last chest x-ray on 07/12/2014.   ASSESSMENT AND PLAN:  1. Supraventricular tachycardia. The patient was recently in the hospital after falling off a ladder, syncopal episode, was on telemetry for a few days without any arrhythmias. Here in the ER, did have an episode of SVT. Unclear whether or not the dehydration is playing a role, but either way, I will start metoprolol 25 mg twice a day. I will admit him as an observation. The patient had an echocardiogram last hospital stay. Hopefully the patient will go home tomorrow after ambulation.  2. Elevated troponin, likely secondary to SVT. No further work-up.  3. Hypertension. Will use metoprolol instead of hydrochlorothiazide and the statin.  4. Fracture of the ribs recently p.r.n. pain control with Norco.  5. Diabetes. Continue Glucovance.  6. Benign prostatic hypertrophy on finasteride.  7. Hyperlipidemia, on pravastatin.  8. Hypothyroidism on levothyroxine.  9. Constipation. We will give lactulose b.i.d.  10. Dehydration. I will give IV fluid hydration. Could be underlying chronic kidney disease here. Creatinine on presentation last time was 1.45 also. That would make this underlying chronic kidney disease stage III.   TIME SPENT ON ADMISSION: 50  minutes.   CODE STATUS: The patient is full code.    ____________________________ Tana Conch. Leslye Peer, MD rjw:JT D: 07/15/2014 22:09:56 ET T: 07/15/2014 23:06:08 ET JOB#: [id number]  cc: Tana Conch. Leslye Peer, MD, <Dictator> Elveria Rising. Damita Dunnings, MD Marisue Brooklyn MD ELECTRONICALLY SIGNED 07/19/2014 17:44

## 2015-01-28 ENCOUNTER — Encounter: Payer: Self-pay | Admitting: General Surgery

## 2015-02-02 ENCOUNTER — Ambulatory Visit (INDEPENDENT_AMBULATORY_CARE_PROVIDER_SITE_OTHER): Payer: Medicare Other | Admitting: General Surgery

## 2015-02-02 ENCOUNTER — Encounter: Payer: Self-pay | Admitting: General Surgery

## 2015-02-02 VITALS — BP 124/68 | HR 70 | Resp 14 | Ht 66.0 in | Wt 167.0 lb

## 2015-02-02 DIAGNOSIS — Z1211 Encounter for screening for malignant neoplasm of colon: Secondary | ICD-10-CM

## 2015-02-02 DIAGNOSIS — Z86010 Personal history of colonic polyps: Secondary | ICD-10-CM

## 2015-02-02 MED ORDER — POLYETHYLENE GLYCOL 3350 17 GM/SCOOP PO POWD: ORAL | Status: DC

## 2015-02-02 NOTE — Progress Notes (Signed)
Patient ID: Aaron Becker, male   DOB: 14-Jun-1930, 81 y.o.   MRN: [id number]  Chief Complaint  Patient presents with  . Other    colonoscopy    HPI Aaron Becker is a 81 y.o. male here for a colonoscopy discussion. His last one was done on 08/03/11. Patient states he is not having any GI problems at this time.  HPI  Past Medical History  Diagnosis Date  . Diabetes mellitus, type II 1986  . Hypertension 02/02  . Hypothyroidism 1980's  . Elevated PSA 2011    per Dr. Jacqlyn Larsen with Uro  . Hyperlipidemia   . Syncope and collapse   . Palpitations   . SVT (supraventricular tachycardia)     a. reported SVT during admission at [facility] 06/2014  . Right rib fracture   . Bradycardia   . Colon polyp   . Arthritis     Past Surgical History  Procedure Laterality Date  . Cholecystectomy  1967  . Back surgery  last 1978    x 3  . Varicose vein surgery  1975  . Vasectomy  1975  . Carotid ultrasound  02/02    wnl  . Cystectomy  06/14/04    mucous cyst excision with left thumb, IP joint debridement  . Prostate biopsy  11/01/08    Dr. Reece Agar  . Cyst excision      thumb  . Hernia repair    . Colonoscopy w/ polypectomy  2012    Villous adenoma from the rectum without high-grade dysplasia, 10 mm    Family History  Problem Relation Age of Onset  . Cancer Father     liver  . Cancer Sister     breast  . Cancer Brother     skin  . Cancer Sister     breast  . Heart disease Sister     pacer  . Colon cancer Neg Hx   . Prostate cancer Neg Hx     Social History History  Substance Use Topics  . Smoking status: Never Smoker   . Smokeless tobacco: Never Used  . Alcohol Use: No    Allergies  Allergen Reactions  . Flomax [Tamsulosin Hcl] Other (See Comments)    syncope  . Rosuvastatin     REACTION: muscle stiffness  . Sulfonamide Derivatives     REACTION: unspecified reaction    Current Outpatient Prescriptions  Medication Sig Dispense Refill  . benazepril (LOTENSIN) 10 MG  tablet TAKE 1 TABLET BY MOUTH EVERY DAY 30 tablet 6  . diphenhydrAMINE (BENADRYL) 25 mg capsule Take 25 mg by mouth daily.    Marland Kitchen glimepiride (AMARYL) 1 MG tablet Take 2 tablets (2 mg total) by mouth daily with breakfast. 180 tablet 3  . hydrochlorothiazide (HYDRODIURIL) 12.5 MG tablet Take 1 tablet (12.5 mg total) by mouth daily as needed.    Marland Kitchen levothyroxine (SYNTHROID, LEVOTHROID) 75 MCG tablet TAKE 1 TABLET BY MOUTH EVERY DAY 30 tablet 5  . metFORMIN (GLUCOPHAGE) 500 MG tablet TAKE 1 TABLET BY MOUTH EVERY MORNING, 2 TABLETS AT SUPPER , THEN 1 EXTRA TAB AT NIGHT IF NEEDED 120 tablet 12  . Multiple Vitamins-Minerals (CENTRUM SILVER PO) Take by mouth daily.      . Omega-3 Fatty Acids (FISH OIL) 1000 MG CAPS Take by mouth daily.      . pravastatin (PRAVACHOL) 80 MG tablet TAKE 1 TABLET (80 MG TOTAL) BY MOUTH DAILY. 30 tablet 10  . vitamin C (ASCORBIC ACID) 500 MG tablet Take  500 mg by mouth daily as needed.     . folic acid (FOLVITE) 103 MCG tablet Take 400 mcg by mouth daily.      . polyethylene glycol powder (GLYCOLAX/MIRALAX) powder 255 grams one bottle for colonoscopy prep 255 g 0   No current facility-administered medications for this visit.    Review of Systems Review of Systems  Constitutional: Negative.   Respiratory: Negative.   Cardiovascular: Negative.   Gastrointestinal: Negative.     Blood pressure 124/68, pulse 70, resp. rate 14, height 5\' 6"  (1.676 m), weight 167 lb (75.751 kg).  Physical Exam Physical Exam  Constitutional: He is oriented to person, place, and time. He appears well-developed and well-nourished.  Cardiovascular: Normal rate, regular rhythm and normal heart sounds.   Pulmonary/Chest: Effort normal and breath sounds normal.  Neurological: He is alert and oriented to person, place, and time.  Skin: Skin is warm and dry.    Data Reviewed Villous adenoma from the rectum removed 2012  Assessment    Past history colonic polyps.    Plan    The patient  has an excellent functional status. He still actively maintains his yard of several acres with a push mower. Follow-up colonoscopy is indicated due to the finding of a villous adenoma in 2012.  Colonoscopy with possible biopsy/polypectomy prn: Information regarding the procedure, including its potential risks and complications (including but not limited to perforation of the bowel, which may require emergency surgery to repair, and bleeding) was verbally given to the patient. Educational information regarding lower instestinal endoscopy was given to the patient. Written instructions for how to complete the bowel prep using Miralax were provided. The importance of drinking ample fluids to avoid dehydration as a result of the prep emphasized.    Patient has been scheduled for a colonoscopy on 03-30-15 at [facility]. This patient has been asked to discontinue fish oil one week prior to procedure. He will also hold Metformin day of colonoscopy prep and procedure.    PCP: Dr Phillip Heal Towanda Malkin 02/04/2015, 7:39 AM

## 2015-02-02 NOTE — Patient Instructions (Addendum)
Colonoscopy A colonoscopy is an exam to look at the entire large intestine (colon). This exam can help find problems such as tumors, polyps, inflammation, and areas of bleeding. The exam takes about 1 hour.  LET [facility] CARE PROVIDER KNOW ABOUT:   Any allergies you have.  All medicines you are taking, including vitamins, herbs, eye drops, creams, and over-the-counter medicines.  Previous problems you or members of your family have had with the use of anesthetics.  Any blood disorders you have.  Previous surgeries you have had.  Medical conditions you have. RISKS AND COMPLICATIONS  Generally, this is a safe procedure. However, as with any procedure, complications can occur. Possible complications include:  Bleeding.  Tearing or rupture of the colon wall.  Reaction to medicines given during the exam.  Infection (rare). BEFORE THE PROCEDURE   Ask your health care provider about changing or stopping your regular medicines.  You may be prescribed an oral bowel prep. This involves drinking a large amount of medicated liquid, starting the day before your procedure. The liquid will cause you to have multiple loose stools until your stool is almost clear or light green. This cleans out your colon in preparation for the procedure.  Do not eat or drink anything else once you have started the bowel prep, unless your health care provider tells you it is safe to do so.  Arrange for someone to drive you home after the procedure. PROCEDURE   You will be given medicine to help you relax (sedative).  You will lie on your side with your knees bent.  A long, flexible tube with a light and camera on the end (colonoscope) will be inserted through the rectum and into the colon. The camera sends video back to a computer screen as it moves through the colon. The colonoscope also releases carbon dioxide gas to inflate the colon. This helps your health care provider see the area better.  During  the exam, your health care provider may take a small tissue sample (biopsy) to be examined under a microscope if any abnormalities are found.  The exam is finished when the entire colon has been viewed. AFTER THE PROCEDURE   Do not drive for 24 hours after the exam.  You may have a small amount of blood in your stool.  You may pass moderate amounts of gas and have mild abdominal cramping or bloating. This is caused by the gas used to inflate your colon during the exam.  Ask when your test results will be ready and how you will get your results. Make sure you get your test results. Document Released: 09/07/2000 Document Revised: 07/01/2013 Document Reviewed: 05/18/2013 [facility] Patient Information 2015 [place], [place]. This information is not intended to replace advice given to you by your health care provider. Make sure you discuss any questions you have with your health care provider.  Patient has been scheduled for a colonoscopy on 03-30-15 at [facility]. This patient has been asked to discontinue fish oil one week prior to procedure. He will also hold Metformin day of colonoscopy prep and procedure.

## 2015-02-04 ENCOUNTER — Encounter: Payer: Self-pay | Admitting: General Surgery

## 2015-02-04 DIAGNOSIS — Z86010 Personal history of colonic polyps: Secondary | ICD-10-CM | POA: Insufficient documentation

## 2015-02-04 NOTE — H&P (Signed)
HPI  Aaron Becker is a 81 y.o. male here for a colonoscopy discussion. His last one was done on 08/03/11. Patient states he is not having any GI problems at this time.  HPI  Past Medical History   Diagnosis  Date   .  Diabetes mellitus, type II  1986   .  Hypertension  02/02   .  Hypothyroidism  1980's   .  Elevated PSA  2011     per Dr. Jacqlyn Larsen with Uro   .  Hyperlipidemia    .  Syncope and collapse    .  Palpitations    .  SVT (supraventricular tachycardia)      a. reported SVT during admission at [facility] 06/2014   .  Right rib fracture    .  Bradycardia    .  Colon polyp    .  Arthritis     Past Surgical History   Procedure  Laterality  Date   .  Cholecystectomy   1967   .  Back surgery   last 1978     x 3   .  Varicose vein surgery   1975   .  Vasectomy   1975   .  Carotid ultrasound   02/02     wnl   .  Cystectomy   06/14/04     mucous cyst excision with left thumb, IP joint debridement   .  Prostate biopsy   11/01/08     Dr. Reece Agar   .  Cyst excision       thumb   .  Hernia repair     .  Colonoscopy w/ polypectomy   2012     Villous adenoma from the rectum without high-grade dysplasia, 10 mm    Family History   Problem  Relation  Age of Onset   .  Cancer  Father      liver   .  Cancer  Sister      breast   .  Cancer  Brother      skin   .  Cancer  Sister      breast   .  Heart disease  Sister      pacer   .  Colon cancer  Neg Hx    .  Prostate cancer  Neg Hx     Social History  History   Substance Use Topics   .  Smoking status:  Never Smoker   .  Smokeless tobacco:  Never Used   .  Alcohol Use:  No    Allergies   Allergen  Reactions   .  Flomax [Tamsulosin Hcl]  Other (See Comments)     syncope   .  Rosuvastatin      REACTION: muscle stiffness   .  Sulfonamide Derivatives      REACTION: unspecified reaction    Current Outpatient Prescriptions   Medication  Sig  Dispense  Refill   .  benazepril (LOTENSIN) 10 MG tablet  TAKE 1 TABLET BY MOUTH  EVERY DAY  30 tablet  6   .  diphenhydrAMINE (BENADRYL) 25 mg capsule  Take 25 mg by mouth daily.     Marland Kitchen  glimepiride (AMARYL) 1 MG tablet  Take 2 tablets (2 mg total) by mouth daily with breakfast.  180 tablet  3   .  hydrochlorothiazide (HYDRODIURIL) 12.5 MG tablet  Take 1 tablet (12.5 mg total) by mouth daily as needed.     Marland Kitchen  levothyroxine (SYNTHROID, LEVOTHROID) 75 MCG tablet  TAKE 1 TABLET BY MOUTH EVERY DAY  30 tablet  5   .  metFORMIN (GLUCOPHAGE) 500 MG tablet  TAKE 1 TABLET BY MOUTH EVERY MORNING, 2 TABLETS AT SUPPER , THEN 1 EXTRA TAB AT NIGHT IF NEEDED  120 tablet  12   .  Multiple Vitamins-Minerals (CENTRUM SILVER PO)  Take by mouth daily.     .  Omega-3 Fatty Acids (FISH OIL) 1000 MG CAPS  Take by mouth daily.     .  pravastatin (PRAVACHOL) 80 MG tablet  TAKE 1 TABLET (80 MG TOTAL) BY MOUTH DAILY.  30 tablet  10   .  vitamin C (ASCORBIC ACID) 500 MG tablet  Take 500 mg by mouth daily as needed.     .  folic acid (FOLVITE) 837 MCG tablet  Take 400 mcg by mouth daily.     .  polyethylene glycol powder (GLYCOLAX/MIRALAX) powder  255 grams one bottle for colonoscopy prep  255 g  0    No current facility-administered medications for this visit.    Review of Systems  Review of Systems  Constitutional: Negative.  Respiratory: Negative.  Cardiovascular: Negative.  Gastrointestinal: Negative.   Blood pressure 124/68, pulse 70, resp. rate 14, height 5\' 6"  (1.676 m), weight 167 lb (75.751 kg).  Physical Exam  Physical Exam  Constitutional: He is oriented to person, place, and time. He appears well-developed and well-nourished.  Cardiovascular: Normal rate, regular rhythm and normal heart sounds.  Pulmonary/Chest: Effort normal and breath sounds normal.  Neurological: He is alert and oriented to person, place, and time.  Skin: Skin is warm and dry.   Data Reviewed  Villous adenoma from the rectum removed 2012  Assessment   Past history colonic polyps.   Plan   The patient has  an excellent functional status. He still actively maintains his yard of several acres with a push mower. Follow-up colonoscopy is indicated due to the finding of a villous adenoma in 2012.  Colonoscopy with possible biopsy/polypectomy prn: Information regarding the procedure, including its potential risks and complications (including but not limited to perforation of the bowel, which may require emergency surgery to repair, and bleeding) was verbally given to the patient. Educational information regarding lower instestinal endoscopy was given to the patient. Written instructions for how to complete the bowel prep using Miralax were provided. The importance of drinking ample fluids to avoid dehydration as a result of the prep emphasized.   Patient has been scheduled for a colonoscopy on 03-30-15 at [facility]. This patient has been asked to discontinue fish oil one week prior to procedure. He will also hold Metformin day of colonoscopy prep and procedure.  PCP: Dr Phillip Heal Towanda Malkin  02/04/2015, 7:39 AM

## 2015-03-23 ENCOUNTER — Telehealth: Payer: Self-pay | Admitting: *Deleted

## 2015-03-23 NOTE — Telephone Encounter (Signed)
Patient's wife was contacted today and confirms no medication changes since last office visit.   She also states that the patient has pre-registered and has Miralax prescription.   We will proceed with colonoscopy that is scheduled at [facility] for 03-30-15.  Patient's wife instructed to call should she have further questions.

## 2015-03-30 ENCOUNTER — Encounter
Admission: RE | Disposition: A | Discharge status: Home / Self Care | Payer: Self-pay | Source: Ambulatory Visit | Attending: General Surgery

## 2015-03-30 ENCOUNTER — Ambulatory Visit
Admission: RE | Admit: 2015-03-30 | Discharge: 2015-03-30 | Disposition: A | Discharge status: Home / Self Care | Payer: Medicare Other | Source: Ambulatory Visit | Attending: General Surgery | Admitting: General Surgery

## 2015-03-30 ENCOUNTER — Ambulatory Visit: Payer: Medicare Other | Admitting: Anesthesiology

## 2015-03-30 ENCOUNTER — Encounter: Payer: Self-pay | Admitting: Anesthesiology

## 2015-03-30 DIAGNOSIS — K5730 Diverticulosis of large intestine without perforation or abscess without bleeding: Secondary | ICD-10-CM | POA: Diagnosis not present

## 2015-03-30 DIAGNOSIS — K621 Rectal polyp: Secondary | ICD-10-CM | POA: Insufficient documentation

## 2015-03-30 DIAGNOSIS — Z86010 Personal history of colonic polyps: Secondary | ICD-10-CM | POA: Insufficient documentation

## 2015-03-30 HISTORY — PX: COLONOSCOPY WITH PROPOFOL: SHX5780

## 2015-03-30 LAB — GLUCOSE, CAPILLARY: Glucose-Capillary: 171 mg/dL — ABNORMAL HIGH (ref 65–99)

## 2015-03-30 SURGERY — COLONOSCOPY WITH PROPOFOL
Anesthesia: General

## 2015-03-30 MED ORDER — EPHEDRINE SULFATE 50 MG/ML IJ SOLN
INTRAMUSCULAR | Status: DC | PRN
  Administered 2015-03-30: 10 mg via INTRAVENOUS

## 2015-03-30 MED ORDER — PROPOFOL INFUSION 10 MG/ML OPTIME
INTRAVENOUS | Status: DC | PRN
  Administered 2015-03-30: 80 ug/kg/min via INTRAVENOUS

## 2015-03-30 MED ORDER — LIDOCAINE HCL (CARDIAC) 20 MG/ML IV SOLN
INTRAVENOUS | Status: DC | PRN
  Administered 2015-03-30: 60 mg via INTRAVENOUS

## 2015-03-30 MED ORDER — SODIUM CHLORIDE 0.9 % IV SOLN
INTRAVENOUS | Status: DC
  Administered 2015-03-30: 1000 mL via INTRAVENOUS

## 2015-03-30 MED ORDER — PROPOFOL 10 MG/ML IV BOLUS
INTRAVENOUS | Status: DC | PRN
  Administered 2015-03-30: 30 mg via INTRAVENOUS

## 2015-03-30 MED ORDER — FENTANYL CITRATE (PF) 100 MCG/2ML IJ SOLN
INTRAMUSCULAR | Status: DC | PRN
  Administered 2015-03-30: 50 ug via INTRAVENOUS

## 2015-03-30 NOTE — Anesthesia Postprocedure Evaluation (Signed)
  Anesthesia Post-op Note  Patient: Aaron Becker  Procedure(s) Performed: Procedure(s): COLONOSCOPY WITH PROPOFOL (N/A)  Anesthesia type:General  Patient location: PACU  Post pain: Pain level controlled  Post assessment: Post-op Vital signs reviewed, Patient's Cardiovascular Status Stable, Respiratory Function Stable, Patent Airway and No signs of Nausea or vomiting  Post vital signs: Reviewed and stable  Last Vitals:  Filed Vitals:   03/30/15 1040  BP: 132/66  Pulse: 58  Temp:   Resp: 8    Level of consciousness: awake, alert  and patient cooperative  Complications: No apparent anesthesia complications

## 2015-03-30 NOTE — Transfer of Care (Signed)
Immediate Anesthesia Transfer of Care Note  Patient: Aaron Becker  Procedure(s) Performed: Procedure(s): COLONOSCOPY WITH PROPOFOL (N/A)  Patient Location: PACU  Anesthesia Type:General  Level of Consciousness: sedated  Airway & Oxygen Therapy: Patient Spontanous Breathing and Patient connected to nasal cannula oxygen  Post-op Assessment: Report given to RN and Post -op Vital signs reviewed and stable  Post vital signs: Reviewed and stable  Last Vitals:  Filed Vitals:   03/30/15 1012  BP: 100/49  Pulse:   Temp: 36 C  Resp: 16    Complications: No apparent anesthesia complications

## 2015-03-30 NOTE — Op Note (Signed)
[facility] Patient Name: Aaron Becker Procedure Date: 03/30/2015 9:40 AM MRN: [id number] Account #: [id number] Date of Birth: Sep 13, 1930 Admit Type: Outpatient Age: 81 Room: [facility] ENDO ROOM 4 Gender: Male Note Status: Finalized Procedure:         Colonoscopy Indications:       High risk colon cancer surveillance: Personal history of                     colonic polyps Providers:         Robert Bellow, MD Referring MD:      Elveria Rising. Damita Dunnings (Referring MD) Medicines:         Monitored Anesthesia Care Complications:     No immediate complications. Procedure:         Pre-Anesthesia Assessment:                    - Prior to the procedure, a History and Physical was                     performed, and patient medications, allergies and                     sensitivities were reviewed. The patient's tolerance of                     previous anesthesia was reviewed.                    - The risks and benefits of the procedure and the sedation                     options and risks were discussed with the patient. All                     questions were answered and informed consent was obtained.                    After obtaining informed consent, the colonoscope was                     passed under direct vision. Throughout the procedure, the                     patient's blood pressure, pulse, and oxygen saturations                     were monitored continuously. The Colonoscope was                     introduced through the anus and advanced to the the cecum,                     identified by appendiceal orifice and ileocecal valve. The                     colonoscopy was performed without difficulty. The patient                     tolerated the procedure well. The quality of the bowel                     preparation was excellent. Findings:      A 20 mm polyp was found in the rectum. The polyp was semi-pedunculated.  The polyp was removed with  a hot snare. Polyp resection was incomplete.       The resected tissue was retrieved.      Many small and large-mouthed diverticula were found in the sigmoid colon. Impression:        - One 20 mm polyp in the rectum. Incomplete resection.                     Resected tissue retrieved.                    - Diverticulosis in the sigmoid colon. Recommendation:    - Return to GI clinic in 1 week. Procedure Code(s): --- Professional ---                    [phone], Colonoscopy, flexible; with removal of tumor(s),                     polyp(s), or other lesion(s) by snare technique Diagnosis Code(s): --- Professional ---                    Z86.010, Personal history of colonic polyps                    K62.1, Rectal polyp                    K57.30, Diverticulosis of large intestine without                     perforation or abscess without bleeding CPT copyright 2014 American Medical Association. All rights reserved. The codes documented in this report are preliminary and upon coder review may  be revised to meet current compliance requirements. Robert Bellow, MD 03/30/2015 10:12:10 AM This report has been signed electronically. Number of Addenda: 0 Note Initiated On: 03/30/2015 9:40 AM Scope Withdrawal Time: 0 hours 13 minutes 52 seconds  Total Procedure Duration: 0 hours 19 minutes 16 seconds       [facility]

## 2015-03-30 NOTE — H&P (Signed)
Healthy 81 year old male who was identified with a villous adenoma the rectum in 2012. For follow-up exam.  Excellent functional status.  No change in medications.  Head and neck exams unremarkable.  Chest exam is clear.  Cardiac exam shows regular rhythm without murmur or gallop.  Abdominal examination is unremarkable.  Plan: Colonoscopy.

## 2015-03-30 NOTE — Anesthesia Preprocedure Evaluation (Addendum)
Anesthesia Evaluation  Patient identified by MRN, date of birth, ID band Patient awake    Reviewed: Allergy & Precautions, H&P , NPO status , Patient's Chart, lab work & pertinent test results  History of Anesthesia Complications Negative for: history of anesthetic complications  Airway Mallampati: II  TM Distance: >3 FB Neck ROM: full    Dental no notable dental hx. (+) Upper Dentures, Lower Dentures   Pulmonary neg pulmonary ROS,  breath sounds clear to auscultation  Pulmonary exam normal       Cardiovascular hypertension, negative cardio ROS Normal cardiovascular examRhythm:regular Rate:Normal     Neuro/Psych negative neurological ROS  negative psych ROS   GI/Hepatic negative GI ROS, Neg liver ROS,   Endo/Other  negative endocrine ROSdiabetes, Well Controlled, Type 2Hypothyroidism   Renal/GU negative Renal ROS  negative genitourinary   Musculoskeletal   Abdominal   Peds  Hematology negative hematology ROS (+)   Anesthesia Other Findings   Reproductive/Obstetrics negative OB ROS                            Anesthesia Physical Anesthesia Plan  ASA: III  Anesthesia Plan: General   Post-op Pain Management:    Induction:   Airway Management Planned:   Additional Equipment:   Intra-op Plan:   Post-operative Plan:   Informed Consent: I have reviewed the patients History and Physical, chart, labs and discussed the procedure including the risks, benefits and alternatives for the proposed anesthesia with the patient or authorized representative who has indicated his/her understanding and acceptance.     Plan Discussed with: Anesthesiologist, CRNA and Surgeon  Anesthesia Plan Comments:         Anesthesia Quick Evaluation

## 2015-03-30 NOTE — Discharge Instructions (Signed)
Resume your regular diet today.  No ladders, power tools or driving today.  Restart your metformin and glipizide tomorrow.  We'll arrange for a follow-up examination in 7-10 days to review your pathology report.

## 2015-03-31 LAB — SURGICAL PATHOLOGY

## 2015-04-01 ENCOUNTER — Telehealth: Payer: Self-pay

## 2015-04-01 ENCOUNTER — Encounter: Payer: Self-pay | Admitting: General Surgery

## 2015-04-01 ENCOUNTER — Other Ambulatory Visit: Payer: Self-pay | Admitting: Family Medicine

## 2015-04-01 NOTE — Telephone Encounter (Signed)
Notified patient as instructed, patient pleased. Discussed follow-up appointment for 04/05/15, patient agrees.

## 2015-04-01 NOTE — Telephone Encounter (Signed)
-----   Message from Robert Bellow, MD sent at 03/31/2015  6:44 PM EDT ----- Please notify the patient no cancer was identified in the polyp removed, but he should plan on keeping his scheduled appointment to discuss complete removal. Thank yo ----- Message -----    From: [facility] Interface    Sent: 03/31/2015   9:49 AM      To: Robert Bellow, MD

## 2015-04-06 ENCOUNTER — Ambulatory Visit (INDEPENDENT_AMBULATORY_CARE_PROVIDER_SITE_OTHER): Payer: Medicare Other | Admitting: General Surgery

## 2015-04-06 ENCOUNTER — Encounter: Payer: Self-pay | Admitting: General Surgery

## 2015-04-06 VITALS — BP 126/74 | HR 70 | Resp 14 | Ht 66.0 in | Wt 165.0 lb

## 2015-04-06 DIAGNOSIS — K635 Polyp of colon: Secondary | ICD-10-CM

## 2015-04-06 DIAGNOSIS — D375 Neoplasm of uncertain behavior of rectum: Secondary | ICD-10-CM

## 2015-04-06 NOTE — Patient Instructions (Signed)
Colon Polyps  Polyps are lumps of extra tissue growing inside the body. Polyps can grow in the large intestine (colon). Most colon polyps are noncancerous (benign). However, some colon polyps can become cancerous over time. Polyps that are larger than a pea may be harmful. To be safe, caregivers remove and test all polyps.  CAUSES   Polyps form when mutations in the genes cause your cells to grow and divide even though no more tissue is needed.  RISK FACTORS  There are a number of risk factors that can increase your chances of getting colon polyps. They include:  · Being older than 50 years.  · Family history of colon polyps or colon cancer.  · Long-term colon diseases, such as colitis or Crohn disease.  · Being overweight.  · Smoking.  · Being inactive.  · Drinking too much alcohol.  SYMPTOMS   Most small polyps do not cause symptoms. If symptoms are present, they may include:  · Blood in the stool. The stool may look dark red or black.  · Constipation or diarrhea that lasts longer than 1 week.  DIAGNOSIS  People often do not know they have polyps until their caregiver finds them during a regular checkup. Your caregiver can use 4 tests to check for polyps:  · Digital rectal exam. The caregiver wears gloves and feels inside the rectum. This test would find polyps only in the rectum.  · Barium enema. The caregiver puts a liquid called barium into your rectum before taking X-rays of your colon. Barium makes your colon look white. Polyps are dark, so they are easy to see in the X-ray pictures.  · Sigmoidoscopy. A thin, flexible tube (sigmoidoscope) is placed into your rectum. The sigmoidoscope has a light and tiny camera in it. The caregiver uses the sigmoidoscope to look at the last third of your colon.  · Colonoscopy. This test is like sigmoidoscopy, but the caregiver looks at the entire colon. This is the most common method for finding and removing polyps.  TREATMENT   Any polyps will be removed during a  sigmoidoscopy or colonoscopy. The polyps are then tested for cancer.  PREVENTION   To help lower your risk of getting more colon polyps:  · Eat plenty of fruits and vegetables. Avoid eating fatty foods.  · Do not smoke.  · Avoid drinking alcohol.  · Exercise every day.  · Lose weight if recommended by your caregiver.  · Eat plenty of calcium and folate. Foods that are rich in calcium include milk, cheese, and broccoli. Foods that are rich in folate include chickpeas, kidney beans, and spinach.  HOME CARE INSTRUCTIONS  Keep all follow-up appointments as directed by your caregiver. You may need periodic exams to check for polyps.  SEEK MEDICAL CARE IF:  You notice bleeding during a bowel movement.  Document Released: 06/06/2004 Document Revised: 12/03/2011 Document Reviewed: 11/20/2011  ExitCare® Patient Information ©2015 [employer]. This information is not intended to replace advice given to you by your health care provider. Make sure you discuss any questions you have with your health care provider.

## 2015-04-06 NOTE — Progress Notes (Signed)
Patient ID: Aaron Becker, male   DOB: 07/05/1930, 81 y.o.   MRN: [id number]  Chief Complaint  Patient presents with  . Routine Post Op    colonoscopy    HPI Aaron Becker is a 81 y.o. male. here today to discuss the results of his colonoscopy which was done on 03/30/15.        HPI  Past Medical History  Diagnosis Date  . Diabetes mellitus, type II 1986  . Hypertension 02/02  . Hypothyroidism 1980's  . Elevated PSA 2011    per Dr. Jacqlyn Larsen with Uro  . Hyperlipidemia   . Syncope and collapse   . Palpitations   . SVT (supraventricular tachycardia)     a. reported SVT during admission at [facility] 06/2014  . Right rib fracture   . Bradycardia   . Colon polyp   . Arthritis     Past Surgical History  Procedure Laterality Date  . Cholecystectomy  1967  . Back surgery  last 1978    x 3  . Varicose vein surgery  1975  . Vasectomy  1975  . Carotid ultrasound  02/02    wnl  . Cystectomy  06/14/04    mucous cyst excision with left thumb, IP joint debridement  . Prostate biopsy  11/01/08    Dr. Reece Agar  . Cyst excision      thumb  . Hernia repair    . Colonoscopy w/ polypectomy  2012    Villous adenoma from the rectum without high-grade dysplasia, 10 mm  . Colonoscopy with propofol N/A 03/30/2015    Procedure: COLONOSCOPY WITH PROPOFOL;  Surgeon: Robert Bellow, MD;  Location: [facility] ENDOSCOPY;  Service: Endoscopy;  Laterality: N/A;    Family History  Problem Relation Age of Onset  . Cancer Father     liver  . Cancer Sister     breast  . Cancer Brother     skin  . Cancer Sister     breast  . Heart disease Sister     pacer  . Colon cancer Neg Hx   . Prostate cancer Neg Hx     Social History History  Substance Use Topics  . Smoking status: Never Smoker   . Smokeless tobacco: Never Used  . Alcohol Use: No    Allergies  Allergen Reactions  . Flomax [Tamsulosin Hcl] Other (See Comments)    syncope  . Rosuvastatin     REACTION: muscle stiffness  . Sulfonamide  Derivatives     REACTION: unspecified reaction    Current Outpatient Prescriptions  Medication Sig Dispense Refill  . Alcohol Swabs (B-D SINGLE USE SWABS REGULAR) PADS USE AS DIRECTED TO TEST BLOOD SUGAR ONCE DAILY 100 each PRN  . benazepril (LOTENSIN) 10 MG tablet TAKE 1 TABLET BY MOUTH EVERY DAY 30 tablet 6  . diphenhydrAMINE (BENADRYL) 25 mg capsule Take 25 mg by mouth daily.    . folic acid (FOLVITE) 409 MCG tablet Take 400 mcg by mouth daily.      Marland Kitchen glimepiride (AMARYL) 1 MG tablet Take 2 tablets (2 mg total) by mouth daily with breakfast. 180 tablet 3  . hydrochlorothiazide (HYDRODIURIL) 12.5 MG tablet Take 1 tablet (12.5 mg total) by mouth daily as needed.    Marland Kitchen levothyroxine (SYNTHROID, LEVOTHROID) 75 MCG tablet TAKE 1 TABLET BY MOUTH EVERY DAY 30 tablet 5  . metFORMIN (GLUCOPHAGE) 500 MG tablet TAKE 1 TABLET BY MOUTH EVERY MORNING, 2 TABLETS AT SUPPER , THEN 1 EXTRA TAB AT  NIGHT IF NEEDED 120 tablet 12  . Multiple Vitamins-Minerals (CENTRUM SILVER PO) Take by mouth daily.      . Omega-3 Fatty Acids (FISH OIL) 1000 MG CAPS Take by mouth daily.      . ONE TOUCH ULTRA TEST test strip USE AS DIRECTED TO TEST BLOOD SUGAR ONCE DAILY. DX. E11.9 100 each 3  . pravastatin (PRAVACHOL) 80 MG tablet TAKE 1 TABLET (80 MG TOTAL) BY MOUTH DAILY. 30 tablet 10  . vitamin C (ASCORBIC ACID) 500 MG tablet Take 500 mg by mouth daily as needed.      No current facility-administered medications for this visit.    Review of Systems Review of Systems  Constitutional: Negative.   Respiratory: Negative.   Cardiovascular: Negative.     Blood pressure 126/74, pulse 70, resp. rate 14, height 5\' 6"  (1.676 m), weight 165 lb (74.844 kg).  Physical Exam Physical Exam  Constitutional: He is oriented to person, place, and time. He appears well-developed and well-nourished.  Eyes: Conjunctivae are normal. No scleral icterus.  Neck: Neck supple.  Cardiovascular: Normal rate, regular rhythm and normal heart  sounds.   Pulmonary/Chest: Effort normal and breath sounds normal.  Lymphadenopathy:    He has no cervical adenopathy.  Neurological: He is alert and oriented to person, place, and time.  Skin: Skin is warm and dry.  Psychiatric: He has a normal mood and affect.    Data Reviewed DIAGNOSIS:  A. RECTAL POLYP; HOT SNARE:  - FRAGMENTS OF TUBULOVILLOUS ADENOMA.  - NEGATIVE FOR HIGH-GRADE DYSPLASIA AND MALIGNANCY.   Assessment    Tubulovillous adenoma the lower rectum.    Plan    Colonoscopy images were reviewed with the patient and his wife. The portion of the polyp resected the time of the recent colonoscopy sat on a broader plane of sessile polyp material. This is likely all benign, but I did recommend transanal excision to confirm no evidence of malignancy. The patient not ready to proceed at this time.  The procedure was reviewed in detail including the outpatient nature and the small but    known risk of bleeding or infection.  The patient will notify the office if he desires to proceed.   PCP:  Chancy Hurter, Forest Gleason 04/06/2015, 8:10 PM

## 2015-04-17 ENCOUNTER — Other Ambulatory Visit: Payer: Self-pay | Admitting: Family Medicine

## 2015-05-22 ENCOUNTER — Other Ambulatory Visit: Payer: Self-pay | Admitting: Family Medicine

## 2015-06-05 ENCOUNTER — Other Ambulatory Visit: Payer: Self-pay | Admitting: Family Medicine

## 2015-06-05 DIAGNOSIS — E119 Type 2 diabetes mellitus without complications: Secondary | ICD-10-CM

## 2015-06-07 ENCOUNTER — Other Ambulatory Visit (INDEPENDENT_AMBULATORY_CARE_PROVIDER_SITE_OTHER): Payer: Medicare Other

## 2015-06-07 DIAGNOSIS — E119 Type 2 diabetes mellitus without complications: Secondary | ICD-10-CM

## 2015-06-07 LAB — HEMOGLOBIN A1C: HEMOGLOBIN A1C: 7.8 % — AB (ref 4.6–6.5)

## 2015-06-14 ENCOUNTER — Ambulatory Visit (INDEPENDENT_AMBULATORY_CARE_PROVIDER_SITE_OTHER): Payer: Medicare Other | Admitting: Family Medicine

## 2015-06-14 ENCOUNTER — Encounter: Payer: Self-pay | Admitting: Family Medicine

## 2015-06-14 VITALS — BP 98/52 | HR 70 | Temp 98.0°F | Wt 176.0 lb

## 2015-06-14 DIAGNOSIS — E119 Type 2 diabetes mellitus without complications: Secondary | ICD-10-CM | POA: Diagnosis not present

## 2015-06-14 DIAGNOSIS — I10 Essential (primary) hypertension: Secondary | ICD-10-CM | POA: Diagnosis not present

## 2015-06-14 MED ORDER — BENAZEPRIL HCL 10 MG PO TABS: 5.0000 mg | ORAL_TABLET | Freq: Every day | ORAL | Status: DC

## 2015-06-14 NOTE — Assessment & Plan Note (Signed)
A1c improved.  Not at goal, but improved.  D/w pt.  Wouldn't try to push him lower than 7 on his A1c.  Fasting sugars okay for now, wouldn't change meds.  He agrees.  Can recheck in about 6 months.

## 2015-06-14 NOTE — Progress Notes (Signed)
Pre visit review using our clinic review tool, if applicable. No additional management support is needed unless otherwise documented below in the visit note.  Diabetes:  Using medications without difficulties: yes Hypoglycemic episodes:no Hyperglycemic episodes:no Feet problems: no Blood Sugars averaging: ~120s if fasting eye exam within last year: due this winter.   A1c improved.  Not at goal, but improved.  D/w pt.   He wanted to defer the general surgery f/u for now.  He will f/u with surgery clinic later this fall.  No blood in stool.    HTN. Not lightheaded but some fatigue noted.  Still on ACE and HCTZ with HCTZ daily to control edema w/o ADE o/w.   Meds, vitals, and allergies reviewed.   ROS: See HPI.  Otherwise negative.    GEN: nad, alert and oriented HEENT: mucous membranes moist NECK: supple w/o LA CV: rrr. PULM: ctab, no inc wob ABD: soft, +bs EXT: no edema

## 2015-06-14 NOTE — Patient Instructions (Signed)
Recheck labs before a physical in about 6 months.  If you have concerns in the meantime the let me know.   I would cut the benazepril back to 5mg  a day.  Cut the 10mg  tab in half.  Update me if not better.   Take care.  Glad to see you.

## 2015-06-14 NOTE — Assessment & Plan Note (Signed)
Would cut ACE to 5mg  a day and if not improved, we may need to stop totally.  Would continue HCTZ for now.  D/w pt.  He agrees.  He can update me as needed.

## 2015-06-27 ENCOUNTER — Telehealth: Payer: Self-pay

## 2015-06-27 NOTE — Telephone Encounter (Signed)
Manus Gunning (DPR signed) request  a new rx for Benazepril 5 mg taking one daily to [place]. Pt having difficulty cutting the 10 mg in half.

## 2015-06-28 MED ORDER — BENAZEPRIL HCL 5 MG PO TABS: 5.0000 mg | ORAL_TABLET | Freq: Every day | ORAL | Status: DC

## 2015-06-28 NOTE — Telephone Encounter (Signed)
Sent. Thanks.   

## 2015-07-11 ENCOUNTER — Other Ambulatory Visit: Payer: Self-pay

## 2015-07-11 MED ORDER — GLUCOSE BLOOD VI STRP: ORAL_STRIP | Status: DC

## 2015-07-11 NOTE — Telephone Encounter (Signed)
Manus Gunning DPR signed request one touch test strips to [facility] [address]; done per protocol and Manus Gunning voiced understanding.

## 2015-07-14 ENCOUNTER — Telehealth: Payer: Self-pay | Admitting: Family Medicine

## 2015-07-14 NOTE — Telephone Encounter (Signed)
I haven't seen a fax about this.  Call pharmacy and clarify.   Thanks.

## 2015-07-14 NOTE — Telephone Encounter (Signed)
Aaron Becker called [name] is almost out of test strips  One touch [facility] needs part B form filled out by dr Damita Dunnings so he can get his test strips [facility] stated they have been sending this request all week Please advise Aaron Becker when this is done  Aaron Becker stated she didn't want to have to come down here to talk to Dr Damita Dunnings in person to get this done

## 2015-07-14 NOTE — Telephone Encounter (Signed)
Form completed by Dr. Damita Dunnings and I faxed it back to pharmacy. Thanks!

## 2015-07-14 NOTE — Telephone Encounter (Signed)
Called pharmacy. They stated that they will fax over another form. I will give to Dr. Damita Dunnings when received. Thanks!

## 2015-08-16 ENCOUNTER — Ambulatory Visit (INDEPENDENT_AMBULATORY_CARE_PROVIDER_SITE_OTHER): Payer: Medicare Other | Admitting: Family Medicine

## 2015-08-16 ENCOUNTER — Encounter: Payer: Self-pay | Admitting: Family Medicine

## 2015-08-16 VITALS — BP 152/76 | HR 74 | Temp 98.4°F | Wt 168.8 lb

## 2015-08-16 DIAGNOSIS — M545 Low back pain, unspecified: Secondary | ICD-10-CM

## 2015-08-16 DIAGNOSIS — K5732 Diverticulitis of large intestine without perforation or abscess without bleeding: Secondary | ICD-10-CM | POA: Diagnosis not present

## 2015-08-16 MED ORDER — HYDROCODONE-ACETAMINOPHEN 5-325 MG PO TABS: 0.5000 | ORAL_TABLET | Freq: Four times a day (QID) | ORAL | Status: DC | PRN

## 2015-08-16 MED ORDER — METRONIDAZOLE 500 MG PO TABS: 500.0000 mg | ORAL_TABLET | Freq: Three times a day (TID) | ORAL | Status: DC

## 2015-08-16 MED ORDER — CIPROFLOXACIN HCL 500 MG PO TABS: 500.0000 mg | ORAL_TABLET | Freq: Two times a day (BID) | ORAL | Status: DC

## 2015-08-16 NOTE — Progress Notes (Signed)
Pre visit review using our clinic review tool, if applicable. No additional management support is needed unless otherwise documented below in the visit note.  Constipated for about 1 week.  Some back pain.   Last BM was 2 days ago after home treatment with miralax.  Still with back pain in the meantime, similar to prev back pain with diverticulitis. No dysuria.  No fevers.  No vomiting.  No blood in stool now or recently.  No abd pain except for irritated feeling in LLQ.  Appetite is fair.  No radicular pain.   He likely has a high pain tolerance.    H/o diverticulitis in the past on prev CT years ago, prev report d/w pt and reviewed with patient.   PMH and SH reviewed  ROS: See HPI, otherwise noncontributory.  Meds, vitals, and allergies reviewed.   nad ncat Mmm Neck supple, no LA rrr ctab abd soft, except for slightly ttp in the LLQ, no rebound.  Normal BS Not ttp o/w.  Back w/o midline pain but B (R>L) lower back slightly ttp.  No rash, no bruising.  No radicular sx with leg extension R or L.  S/S wnl BLE No CVA pain

## 2015-08-16 NOTE — Patient Instructions (Signed)
Start the antibiotics today (cipro and flagyl).  Clear liquid diet for now- soup, broth, etc.  Take hydrocodone for the back pain.  I think that is a separate issue.  Sedation caution.  If you have a sudden increase in pain, then go to the ER.  Update me tomorrow.  Glad to see you.

## 2015-08-17 ENCOUNTER — Telehealth: Payer: Self-pay

## 2015-08-17 ENCOUNTER — Encounter: Payer: Self-pay | Admitting: Family Medicine

## 2015-08-17 DIAGNOSIS — M545 Low back pain, unspecified: Secondary | ICD-10-CM | POA: Insufficient documentation

## 2015-08-17 NOTE — Telephone Encounter (Signed)
Patient's caregiver Joaquim Lai) notified as instructed by telephone and verbalized understanding.

## 2015-08-17 NOTE — Assessment & Plan Note (Signed)
Likely a return of diverticulitis, with back pain potentially unrelated.  He has known hx and LLQ pain.  Still okay for outpatient f/u.  Start cipro flagyl, clear liquid diet.  See AVS.  Update [place] tomorrow.  If worsening, to ER.  He agrees.

## 2015-08-17 NOTE — Telephone Encounter (Signed)
Noted, I would give this more time.  If worsening, then call back with the on call line.  If gradually improving over the next few days, then continue as is.  Thanks.

## 2015-08-17 NOTE — Telephone Encounter (Signed)
Left message on answering machine at home number to call back. 

## 2015-08-17 NOTE — Assessment & Plan Note (Signed)
I think this is a potentially separate issue, he agrees.  Likely a MSK strain, no emergent issues that would need imaging.  Would try ice/heat and then use hydrocodone with sedation caution.  He agrees.  Update [place] tomorrow.  Still okay for outpatient f/u. >25 minutes spent in face to face time with patient, >50% spent in counselling or coordination of care.

## 2015-08-17 NOTE — Telephone Encounter (Signed)
V/M left that pt is about the same as when seen 08/16/15. Pt started taking med on 08/16/15 and does not think med has had enough time to make difference in how pt doing.

## 2015-08-22 ENCOUNTER — Encounter: Payer: Self-pay | Admitting: Family Medicine

## 2015-08-22 ENCOUNTER — Ambulatory Visit (INDEPENDENT_AMBULATORY_CARE_PROVIDER_SITE_OTHER): Payer: Medicare Other | Admitting: Family Medicine

## 2015-08-22 ENCOUNTER — Ambulatory Visit: Payer: Medicare Other | Admitting: Family Medicine

## 2015-08-22 ENCOUNTER — Ambulatory Visit (INDEPENDENT_AMBULATORY_CARE_PROVIDER_SITE_OTHER)
Admission: RE | Admit: 2015-08-22 | Discharge: 2015-08-22 | Disposition: A | Discharge status: Home / Self Care | Payer: Medicare Other | Source: Ambulatory Visit | Attending: Family Medicine | Admitting: Family Medicine

## 2015-08-22 VITALS — BP 146/70 | HR 78 | Temp 98.3°F | Wt 166.2 lb

## 2015-08-22 DIAGNOSIS — K5732 Diverticulitis of large intestine without perforation or abscess without bleeding: Secondary | ICD-10-CM | POA: Diagnosis not present

## 2015-08-22 DIAGNOSIS — M545 Low back pain: Secondary | ICD-10-CM

## 2015-08-22 DIAGNOSIS — S22089A Unspecified fracture of T11-T12 vertebra, initial encounter for closed fracture: Secondary | ICD-10-CM | POA: Diagnosis not present

## 2015-08-22 DIAGNOSIS — R1032 Left lower quadrant pain: Secondary | ICD-10-CM

## 2015-08-22 LAB — CBC WITH DIFFERENTIAL/PLATELET
BASOS ABS: 0.1 10*3/uL (ref 0.0–0.1)
BASOS PCT: 0.5 % (ref 0.0–3.0)
EOS ABS: 0 10*3/uL (ref 0.0–0.7)
Eosinophils Relative: 0.3 % (ref 0.0–5.0)
HCT: 41.6 % (ref 39.0–52.0)
Hemoglobin: 13.7 g/dL (ref 13.0–17.0)
LYMPHS ABS: 0.9 10*3/uL (ref 0.7–4.0)
Lymphocytes Relative: 9.1 % — ABNORMAL LOW (ref 12.0–46.0)
MCHC: 32.9 g/dL (ref 30.0–36.0)
MCV: 91 fl (ref 78.0–100.0)
Monocytes Absolute: 0.7 10*3/uL (ref 0.1–1.0)
Monocytes Relative: 7 % (ref 3.0–12.0)
NEUTROS ABS: 7.9 10*3/uL — AB (ref 1.4–7.7)
NEUTROS PCT: 83.1 % — AB (ref 43.0–77.0)
PLATELETS: 292 10*3/uL (ref 150.0–400.0)
RBC: 4.57 Mil/uL (ref 4.22–5.81)
RDW: 13.9 % (ref 11.5–15.5)
WBC: 9.5 10*3/uL (ref 4.0–10.5)

## 2015-08-22 LAB — COMPREHENSIVE METABOLIC PANEL
ALT: 40 U/L (ref 0–53)
AST: 43 U/L — AB (ref 0–37)
Albumin: 3.9 g/dL (ref 3.5–5.2)
Alkaline Phosphatase: 67 U/L (ref 39–117)
BUN: 18 mg/dL (ref 6–23)
CO2: 29 mEq/L (ref 19–32)
Calcium: 9.6 mg/dL (ref 8.4–10.5)
Chloride: 97 mEq/L (ref 96–112)
Creatinine, Ser: 1.31 mg/dL (ref 0.40–1.50)
GFR: 55.52 mL/min — ABNORMAL LOW (ref >60.00)
GLUCOSE: 223 mg/dL — AB (ref 70–99)
Potassium: 3.9 mEq/L (ref 3.5–5.1)
Sodium: 134 mEq/L — ABNORMAL LOW (ref 135–145)
Total Bilirubin: 0.6 mg/dL (ref 0.2–1.2)
Total Protein: 7.1 g/dL (ref 6.0–8.3)

## 2015-08-22 NOTE — Patient Instructions (Signed)
I still think you had two different issues going on - back and abdomen.  Go to the lab on the way out.  We'll contact you with your xray and lab report. Continue the antibiotics (and pain medicine as needed) for now and we'll be in touch.  Take care.  Glad to see you.

## 2015-08-22 NOTE — Progress Notes (Signed)
Pre visit review using our clinic review tool, if applicable. No additional management support is needed unless otherwise documented below in the visit note.  Still with some back pain, worse in the AM. Better as the day goes on.  Still across the lower back, along the belt line B.  No leg pain.  Took hydrocodone, helped him sleep some.  No ADE on med other than possible constipation.  Taking hydrocodone 1/2 tab at a time.  He tried heat on his lower back w/o a lot of help.    Had to use mag citrate for BM recently, with effect.  Still on abx.  No fevers, no vomiting.  Abd pain in LLQ is better than prev.  Small BM last night.  No blood in stool.    Prev notes reviewed re: h/o diverticulitis and d/w pt, esp prev CT 02/14/11.   Meds, vitals, and allergies reviewed.   ROS: See HPI.  Otherwise, noncontributory.   nad ncat Mmm rrr ctab abd soft, not ttp Ext w/o edema.   Still with lower back pain but no pain on twisting or leaning forward.   No bruising.   Normal S/S BLE grossly.    xrays reviewed.

## 2015-08-23 ENCOUNTER — Encounter: Payer: Self-pay | Admitting: Family Medicine

## 2015-08-23 ENCOUNTER — Ambulatory Visit: Payer: Medicare Other | Admitting: Family Medicine

## 2015-08-23 NOTE — Assessment & Plan Note (Signed)
Likely separate from the abd pain, continue hydrocodone, see notes on imaging and check vit D in the future.  Orders are in.

## 2015-08-23 NOTE — Assessment & Plan Note (Signed)
Still likely with resolving mild diverticulitis flare.  See notes on labs, continue abx and update me as needed.  I would expect this to improve in the next few days.  Okay for outpatient f/u.

## 2015-08-24 ENCOUNTER — Other Ambulatory Visit (INDEPENDENT_AMBULATORY_CARE_PROVIDER_SITE_OTHER): Payer: Medicare Other

## 2015-08-24 DIAGNOSIS — S22089A Unspecified fracture of T11-T12 vertebra, initial encounter for closed fracture: Secondary | ICD-10-CM

## 2015-08-24 LAB — VITAMIN D 25 HYDROXY (VIT D DEFICIENCY, FRACTURES): VITD: 31.64 ng/mL (ref 30.00–100.00)

## 2015-09-05 ENCOUNTER — Telehealth: Payer: Self-pay

## 2015-09-05 NOTE — Telephone Encounter (Signed)
Manus Gunning Stiens(DPR signed) left v/m requesting celebrex to [facility] for lower back pain; pt last seen 08/22/15 with back pain and abd pain.Please advise. Ms Barrington Ellison request cb.

## 2015-09-05 NOTE — Telephone Encounter (Signed)
Left message on patient's cell phone voicemail to return call.

## 2015-09-05 NOTE — Telephone Encounter (Signed)
Left message on patient's voicemail to return call

## 2015-09-05 NOTE — Telephone Encounter (Signed)
Before he tries that med I need his sulfa allergy clarified.  Alternatively, has he been on celebrex prev and tolerated it?  Thanks.  Let me know.

## 2015-09-06 ENCOUNTER — Ambulatory Visit (INDEPENDENT_AMBULATORY_CARE_PROVIDER_SITE_OTHER): Payer: Medicare Other | Admitting: Family Medicine

## 2015-09-06 ENCOUNTER — Encounter: Payer: Self-pay | Admitting: Family Medicine

## 2015-09-06 VITALS — BP 122/60 | HR 79 | Temp 98.0°F | Wt 160.5 lb

## 2015-09-06 DIAGNOSIS — S22089S Unspecified fracture of T11-T12 vertebra, sequela: Secondary | ICD-10-CM | POA: Diagnosis not present

## 2015-09-06 DIAGNOSIS — M4850XS Collapsed vertebra, not elsewhere classified, site unspecified, sequela of fracture: Secondary | ICD-10-CM

## 2015-09-06 DIAGNOSIS — IMO0001 Reserved for inherently not codable concepts without codable children: Secondary | ICD-10-CM

## 2015-09-06 NOTE — Patient Instructions (Signed)
Keep taking tylenol for now, use the hydrocodone if needed, but don't take them together.  [name] will call about your referral to the other clinic.  Take care.  Glad to see you.

## 2015-09-06 NOTE — Telephone Encounter (Signed)
Aaron Becker advised and says her daughter had suggested that maybe Celebrex would help but he has not been on it previously. They were unaware of the Sulfa components in Celebrex and feel that it is probably not a good choice as he is unable to tolerate Sulfa derivatives.  Patient has appt this afternoon.

## 2015-09-06 NOTE — Progress Notes (Signed)
Pre visit review using our clinic review tool, if applicable. No additional management support is needed unless otherwise documented below in the visit note.  His abd sx resolved, it appears the back and abd sx were from different causes.  Back pain continues.  Lower back pain.  Known vert fracture.  Pain with movement, getting out of bed, pain if he is riding in the car and hits a bump in the road.  No weakness.  No new sx.    Meds, vitals, and allergies reviewed.   ROS: See HPI.  Otherwise, noncontributory.  nad ncat rrr ctab Abd soft Back ttp in the lower midline No rash No bruising S/S grossly wnl BLE

## 2015-09-06 NOTE — Telephone Encounter (Signed)
Will see today. Thanks

## 2015-09-06 NOTE — Telephone Encounter (Signed)
Manus Gunning returned your call.  She wanted sooner appointment I put mr Ferriss into see dr Damita Dunnings today @ 3:15

## 2015-09-07 NOTE — Assessment & Plan Note (Signed)
D/w pt.  Now that likely diverticulitis flare is resolved, reasonable to address.  He asked about possible kyphoplasty.  This is reasonable to consider, refer.  Would avoid celebrex given his sulfa hx.  He agrees.  App help from neurosurgery clinic.  Images reviewed with patient at [facility].

## 2015-09-08 ENCOUNTER — Other Ambulatory Visit: Payer: Self-pay | Admitting: Neurosurgery

## 2015-09-08 ENCOUNTER — Ambulatory Visit: Payer: Medicare Other | Admitting: Family Medicine

## 2015-09-09 ENCOUNTER — Other Ambulatory Visit: Payer: Self-pay | Admitting: Family Medicine

## 2015-09-12 ENCOUNTER — Other Ambulatory Visit: Payer: Self-pay | Admitting: Neurosurgery

## 2015-09-12 DIAGNOSIS — S22009D Unspecified fracture of unspecified thoracic vertebra, subsequent encounter for fracture with routine healing: Secondary | ICD-10-CM

## 2015-09-15 ENCOUNTER — Other Ambulatory Visit: Payer: Self-pay

## 2015-09-15 MED ORDER — HYDROCODONE-ACETAMINOPHEN 5-325 MG PO TABS: 0.5000 | ORAL_TABLET | Freq: Four times a day (QID) | ORAL | Status: DC | PRN

## 2015-09-15 NOTE — Telephone Encounter (Signed)
Manus Gunning notified prescription is ready to be picked up at the front desk.

## 2015-09-15 NOTE — Telephone Encounter (Signed)
Aaron Becker (DPR signed)requesting rx hydrocodone apap. Call when ready for pick up. rx last printed #20 on 08/16/15. Last seen 09/06/15 with back pain. Pt saw Dr Hal Neer on 09/07/15 and MRI scheduled on 10/03/15. Aaron Becker has tried to contact Dr Sande Rives office but no cb. Please advise.Aaron Becker request cb.

## 2015-09-15 NOTE — Telephone Encounter (Signed)
Manus Gunning called. Asking about rx. I explained Dr. Diona Browner was seeing patients and would get to the refill as soon as possible. As I was explaining she starting saying she understands the process and will she understands that the doc's are busy the pt is in pain and she will just wait on the call for when rx is ready.

## 2015-09-16 NOTE — Telephone Encounter (Signed)
Pt emerg contact, Leodis Sias came in to pick up prescription.  Left a page of notes with office supervisor detailing her experience in getting medication refilled.   Stated that we were initially very helpful and was complimentary of Morey Hummingbird for her caring attitude, but stated at length that during later phone conversations, our staff was not helpful and rude.  She states she hung up phone, did not want to listen to why pt's prescription was not ready.  Scanned document into chart.

## 2015-09-27 ENCOUNTER — Ambulatory Visit: Payer: Medicare Other

## 2015-09-27 ENCOUNTER — Ambulatory Visit
Admission: RE | Admit: 2015-09-27 | Discharge: 2015-09-27 | Disposition: A | Discharge status: Home / Self Care | Payer: Medicare Other | Source: Ambulatory Visit | Attending: Neurosurgery | Admitting: Neurosurgery

## 2015-09-27 DIAGNOSIS — M4854XD Collapsed vertebra, not elsewhere classified, thoracic region, subsequent encounter for fracture with routine healing: Secondary | ICD-10-CM | POA: Insufficient documentation

## 2015-09-27 DIAGNOSIS — M47816 Spondylosis without myelopathy or radiculopathy, lumbar region: Secondary | ICD-10-CM | POA: Insufficient documentation

## 2015-09-27 DIAGNOSIS — M4806 Spinal stenosis, lumbar region: Secondary | ICD-10-CM | POA: Insufficient documentation

## 2015-09-27 DIAGNOSIS — M549 Dorsalgia, unspecified: Secondary | ICD-10-CM | POA: Insufficient documentation

## 2015-09-27 DIAGNOSIS — S22009D Unspecified fracture of unspecified thoracic vertebra, subsequent encounter for fracture with routine healing: Secondary | ICD-10-CM | POA: Diagnosis present

## 2015-09-28 ENCOUNTER — Other Ambulatory Visit: Payer: Self-pay | Admitting: Neurosurgery

## 2015-09-29 ENCOUNTER — Ambulatory Visit (INDEPENDENT_AMBULATORY_CARE_PROVIDER_SITE_OTHER): Payer: Medicare Other | Admitting: Primary Care

## 2015-09-29 ENCOUNTER — Encounter: Payer: Self-pay | Admitting: Primary Care

## 2015-09-29 VITALS — BP 148/68 | HR 77 | Temp 97.8°F | Wt 158.8 lb

## 2015-09-29 DIAGNOSIS — G8929 Other chronic pain: Secondary | ICD-10-CM

## 2015-09-29 DIAGNOSIS — R05 Cough: Secondary | ICD-10-CM | POA: Diagnosis not present

## 2015-09-29 DIAGNOSIS — M549 Dorsalgia, unspecified: Secondary | ICD-10-CM

## 2015-09-29 DIAGNOSIS — R059 Cough, unspecified: Secondary | ICD-10-CM

## 2015-09-29 MED ORDER — HYDROCODONE-ACETAMINOPHEN 5-325 MG PO TABS: 0.5000 | ORAL_TABLET | Freq: Four times a day (QID) | ORAL | Status: DC | PRN

## 2015-09-29 MED ORDER — AZITHROMYCIN 250 MG PO TABS: ORAL_TABLET | ORAL | Status: DC

## 2015-09-29 NOTE — Progress Notes (Signed)
Subjective:    Patient ID: FLOR RICARTE, male    DOB: 16-Aug-1930, 82 y.o.   MRN: [id number]  HPI  Mr. Winquist is an 82 year old male who presents today with a chief complaint of cough. He also reports nasal congestion and chest congestion. Denies fevers, SOB. His symptoms have been present a little over a week. He's been taking Coricedin, Mucinex DM with temporary relief. Overall he's feeling about the same.   Review of Systems  Constitutional: Negative for fever and chills.  HENT: Positive for congestion and sinus pressure. Negative for sore throat.   Respiratory: Positive for cough. Negative for shortness of breath.   Cardiovascular: Negative for chest pain.       Past Medical History  Diagnosis Date  . Diabetes mellitus, type II ([place]) 1986  . Hypertension 02/02  . Hypothyroidism 1980's  . Elevated PSA 2011    per Dr. Jacqlyn Larsen with Uro  . Hyperlipidemia   . Syncope and collapse   . Palpitations   . SVT (supraventricular tachycardia) ([place])     a. reported SVT during admission at [facility] 06/2014  . Right rib fracture   . Bradycardia   . Colon polyp   . Arthritis     Social History   Social History  . Marital Status: Widowed    Spouse Name: N/A  . Number of Children: N/A  . Years of Education: N/A   Occupational History  . [employer] banker     Retired 1999   Social History Main Topics  . Smoking status: Never Smoker   . Smokeless tobacco: Never Used  . Alcohol Use: No  . Drug Use: No  . Sexual Activity: Not on file   Other Topics Concern  . Not on file   Social History Narrative   Mar[place]- widower 2000 ([place]), not marred but with partner since 2001.    Retired from [employer].     [employer] '[employer], stationed in [place]    Past Surgical History  Procedure Laterality Date  . Cholecystectomy  1967  . Back surgery  last 1978    x 3  . Varicose vein surgery  1975  . Vasectomy  1975  . Carotid ultrasound  02/02    [facility]  . Cystectomy  06/14/04   mucous cyst excision with left thumb, IP joint debridement  . Prostate biopsy  11/01/08    Dr. Reece Agar  . Cyst excision      thumb  . Hernia repair    . Colonoscopy w/ polypectomy  2012    Villous adenoma from the rectum without high-grade dysplasia, 10 mm  . Colonoscopy with propofol N/A 03/30/2015    Procedure: COLONOSCOPY WITH PROPOFOL;  Surgeon: Robert Bellow, MD;  Location: [facility] ENDOSCOPY;  Service: Endoscopy;  Laterality: N/A;    Family History  Problem Relation Age of Onset  . Cancer Father     liver  . Cancer Sister     breast  . Cancer Brother     skin  . Cancer Sister     breast  . Heart disease Sister     pacer  . Colon cancer Neg Hx   . Prostate cancer Neg Hx     Allergies  Allergen Reactions  . Flomax [Tamsulosin Hcl] Other (See Comments)    syncope  . Ibuprofen Other (See Comments)    Oral blisters at high doses  . Rosuvastatin     REACTION: muscle stiffness  . Sulfonamide Derivatives  REACTION: unspecified reaction    Current Outpatient Prescriptions on File Prior to Visit  Medication Sig Dispense Refill  . Alcohol Swabs (B-D SINGLE USE SWABS REGULAR) PADS USE AS DIRECTED TO TEST BLOOD SUGAR ONCE DAILY 100 each PRN  . benazepril (LOTENSIN) 5 MG tablet Take 1 tablet (5 mg total) by mouth daily. 90 tablet 3  . diphenhydrAMINE (BENADRYL) 25 mg capsule Take 25 mg by mouth daily.    . folic acid (FOLVITE) A999333 MCG tablet Take 400 mcg by mouth daily.      Marland Kitchen glimepiride (AMARYL) 1 MG tablet Take 2 tablets (2 mg total) by mouth daily with breakfast. 180 tablet 3  . glucose blood (ONE TOUCH ULTRA TEST) test strip USE AS DIRECTED TO TEST BLOOD SUGAR ONCE DAILY. DX. E11.9 100 each 3  . hydrochlorothiazide (HYDRODIURIL) 12.5 MG tablet Take 1 tablet (12.5 mg total) by mouth daily as needed.    . hydrochlorothiazide (HYDRODIURIL) 12.5 MG tablet TAKE 0.5-1 TABLETS (6.25-12.5 MG TOTAL) BY MOUTH DAILY AS NEEDED (FOR SWELLING). 90 tablet 3  . levothyroxine  (SYNTHROID, LEVOTHROID) 75 MCG tablet TAKE 1 TABLET BY MOUTH EVERY DAY 30 tablet 5  . metFORMIN (GLUCOPHAGE) 500 MG tablet TAKE 1 TABLET BY MOUTH EVERY MORNING, 2 TABLETS AT SUPPER , THEN 1 EXTRA TAB AT NIGHT IF NEEDED 120 tablet 12  . Multiple Vitamins-Minerals (CENTRUM SILVER PO) Take by mouth daily.      . Omega-3 Fatty Acids (FISH OIL) 1000 MG CAPS Take by mouth daily.      . pravastatin (PRAVACHOL) 80 MG tablet TAKE 1 TABLET (80 MG TOTAL) BY MOUTH DAILY. 30 tablet 10  . vitamin C (ASCORBIC ACID) 500 MG tablet Take 500 mg by mouth daily as needed.      No current facility-administered medications on file prior to visit.    BP 148/68 mmHg  Pulse 77  Temp(Src) 97.8 F (36.6 C) (Oral)  Wt 158 lb 12.8 oz (72.031 kg)  SpO2 97%    Objective:   Physical Exam  Constitutional: He appears well-nourished.  HENT:  Right Ear: Tympanic membrane and ear canal normal.  Left Ear: Tympanic membrane and ear canal normal.  Nose: Right sinus exhibits no maxillary sinus tenderness and no frontal sinus tenderness. Left sinus exhibits no maxillary sinus tenderness and no frontal sinus tenderness.  Mouth/Throat: Oropharynx is clear and moist.  Eyes: Conjunctivae are normal.  Neck: Neck supple.  Cardiovascular: Normal rate and regular rhythm.   Pulmonary/Chest: Effort normal. He has no wheezes. He has rales.  Mild rales to left upper lobe  Lymphadenopathy:    He has no cervical adenopathy.  Skin: Skin is warm and dry.          Assessment & Plan:  URI:  Cough, chest congestion x 8-9 days. Some improvement with OTC treatment. Exam with mild rales to left upper lobe, otherwise WNL. Suspect viral UR now, but am worried for possible bacterial involvement. Printed Zpak RX for patient to fill in 2-3 days if no improvement or if symptoms become worse.  Continue Mucinex and Coricidin PRN. Fluids, rest, return precautions provided.

## 2015-09-29 NOTE — Progress Notes (Signed)
Pre visit review using our clinic review tool, if applicable. No additional management support is needed unless otherwise documented below in the visit note. 

## 2015-09-29 NOTE — Patient Instructions (Signed)
Fill the Azithromycin antibiotic in 2-3 days if no improvement in symptoms.  I've refilled the hydrocodone as requested for back pain.  Continue Mucinex and Coricidin as discussed. Ensure that you are staying hydrated with water.   Please call if you develop fevers, shortness of breath, extreme fatigue.   It was a pleasure to see you today!

## 2015-10-03 ENCOUNTER — Ambulatory Visit: Payer: Medicare Other

## 2015-10-04 ENCOUNTER — Telehealth: Payer: Self-pay | Admitting: Family Medicine

## 2015-10-04 MED ORDER — BENZONATATE 200 MG PO CAPS: 200.0000 mg | ORAL_CAPSULE | Freq: Three times a day (TID) | ORAL | Status: DC | PRN

## 2015-10-04 NOTE — Telephone Encounter (Signed)
Aaron Becker advised.  Patient has not started ABX.  Joaquim Lai states that Anda Kraft told them not to start it unless he started running a fever and he has not.  Joaquim Lai also states that the surgery office called about 2 hours ago and moved the patient's surgery up from next Thursday (10-13-15) to this coming Thursday (10-06-15) so they will go for the preop tomorrow.

## 2015-10-04 NOTE — Telephone Encounter (Signed)
Tessalon sent.  Did he start abx? Use the tessalon for 1-2 days and then update me later this week.  We may have to get him back in for a check before I write the letter.   Thanks.

## 2015-10-04 NOTE — Telephone Encounter (Signed)
Noted, they may make a decision re: the cough at pre-op. Please let me know what they hear.   I agree with not starting the abx, esp if she wasn't worse/febrile.  Thanks.

## 2015-10-04 NOTE — Telephone Encounter (Signed)
Aaron Becker actually made mention about it being addressed at preop also and says she will keep [place] updated.

## 2015-10-04 NOTE — Telephone Encounter (Signed)
Joaquim Lai stein called -  Cough is persisting, pt scheduled for sx on the 17th Asking for rx for tessalon perle 100 mg  Pharmacy - [facility] [place] [phone] Pt also wants a letter saying that pt is ok for surgery because of cough  cb number is [phone] Cell [phone]

## 2015-10-05 ENCOUNTER — Encounter (HOSPITAL_COMMUNITY)
Admission: RE | Admit: 2015-10-05 | Discharge: 2015-10-05 | Disposition: A | Discharge status: Home / Self Care | Payer: Medicare Other | Source: Ambulatory Visit | Attending: Neurosurgery | Admitting: Neurosurgery

## 2015-10-05 ENCOUNTER — Encounter (HOSPITAL_COMMUNITY): Payer: Self-pay

## 2015-10-05 DIAGNOSIS — E785 Hyperlipidemia, unspecified: Secondary | ICD-10-CM | POA: Diagnosis not present

## 2015-10-05 DIAGNOSIS — M4854XA Collapsed vertebra, not elsewhere classified, thoracic region, initial encounter for fracture: Secondary | ICD-10-CM | POA: Diagnosis not present

## 2015-10-05 DIAGNOSIS — Z7984 Long term (current) use of oral hypoglycemic drugs: Secondary | ICD-10-CM | POA: Diagnosis not present

## 2015-10-05 DIAGNOSIS — I10 Essential (primary) hypertension: Secondary | ICD-10-CM | POA: Diagnosis not present

## 2015-10-05 DIAGNOSIS — E119 Type 2 diabetes mellitus without complications: Secondary | ICD-10-CM | POA: Diagnosis not present

## 2015-10-05 DIAGNOSIS — E039 Hypothyroidism, unspecified: Secondary | ICD-10-CM | POA: Diagnosis not present

## 2015-10-05 DIAGNOSIS — M1990 Unspecified osteoarthritis, unspecified site: Secondary | ICD-10-CM | POA: Diagnosis not present

## 2015-10-05 LAB — CBC
HEMATOCRIT: 38.1 % — AB (ref 39.0–52.0)
HEMOGLOBIN: 12.5 g/dL — AB (ref 13.0–17.0)
MCH: 29.3 pg (ref 26.0–34.0)
MCHC: 32.8 g/dL (ref 30.0–36.0)
MCV: 89.2 fL (ref 78.0–100.0)
Platelets: 222 10*3/uL (ref 150–400)
RBC: 4.27 MIL/uL (ref 4.22–5.81)
RDW: 13.5 % (ref 11.5–15.5)
WBC: 8.5 10*3/uL (ref 4.0–10.5)

## 2015-10-05 LAB — BASIC METABOLIC PANEL
ANION GAP: 11 (ref 5–15)
BUN: 15 mg/dL (ref 6–20)
CHLORIDE: 96 mmol/L — AB (ref 101–111)
CO2: 24 mmol/L (ref 22–32)
CREATININE: 1.14 mg/dL (ref 0.61–1.24)
Calcium: 9.2 mg/dL (ref 8.9–10.3)
GFR calc non Af Amer: 58 mL/min — ABNORMAL LOW (ref >60)
Glucose, Bld: 186 mg/dL — ABNORMAL HIGH (ref 65–99)
POTASSIUM: 4.1 mmol/L (ref 3.5–5.1)
Sodium: 131 mmol/L — ABNORMAL LOW (ref 135–145)

## 2015-10-05 LAB — SURGICAL PCR SCREEN
MRSA, PCR: NEGATIVE
STAPHYLOCOCCUS AUREUS: NEGATIVE

## 2015-10-05 LAB — GLUCOSE, CAPILLARY: Glucose-Capillary: 209 mg/dL — ABNORMAL HIGH (ref 65–99)

## 2015-10-05 NOTE — Pre-Procedure Instructions (Signed)
KOSTON MAIONE  10/05/2015      [id number] [place], [place] - [place] [address] [place] [address] Phone: [phone] Fax: [phone]    Your procedure is scheduled on  Thursday  10/06/15  Report to [facility] Admitting at [phone].  Call this number if you have problems the morning of surgery:  [phone]   Remember:  Do not eat food or drink liquids after midnight.  Take these medicines the morning of surgery with A SIP OF WATER  Hydrocodone if needed, levothyroxine (stop stop fish oil , multivitamin, aspirin, ibuprofen/ advil/ motrin) How to Manage Your Diabetes Before Surgery   Why is it important to control my blood sugar before and after surgery?   Improving blood sugar levels before and after surgery helps healing and can limit problems.  A way of improving blood sugar control is eating a healthy diet by:  - Eating less sugar and carbohydrates  - Increasing activity/exercise  - Talk with your doctor about reaching your blood sugar goals  High blood sugars (greater than 180 mg/dL) can raise your risk of infections and slow down your recovery so you will need to focus on controlling your diabetes during the weeks before surgery.  Make sure that the doctor who takes care of your diabetes knows about your planned surgery including the date and location.  How do I manage my blood sugars before surgery?   Check your blood sugar at least 4 times a day, 2 days before surgery to make sure that they are not too high or low.   Check your blood sugar the morning of your surgery when you wake up and every 2               hours until you get to the Short-Stay unit.  If your blood sugar is less than 70 mg/dL, you will need to treat for low blood sugar by:  Treat a low blood sugar (less than 70 mg/dL) with 1/2 cup of clear juice (cranberry or apple), 4 glucose tablets, OR glucose gel.  Recheck blood sugar  in 15 minutes after treatment (to make sure it is greater than 70 mg/dL).  If blood sugar is not greater than 70 mg/dL on re-check, call [phone] for further instructions.   Report your blood sugar to the Short-Stay nurse when you get to Short-Stay.  References:  [facility], 2007 "How to Manage your Diabetes Before and After Surgery".  What do I do about my diabetes medications?   Do not take oral diabetes medicines (pills) the morning of surgery    If your CBG is greater than 220 mg/dL, you may take 1/2 of your sliding scale (correction) dose of insulin.   For patients with "Insulin Pumps":  Contact your diabetes doctor for specific instructions before surgery.   Decrease basal insulin rates by 20% at midnight the night before surgery.  Note that if your surgery is planned to be longer than 2 hours, your insulin pump will be removed and intravenous (IV) insulin will be started and managed by the nurses and anesthesiologist.  You will be able to restart your insulin pump once you are awake and able to manage it.  Make sure to bring insulin pump supplies to the hospital with you in case your site needs to be changed.        Do not wear jewelry, make-up or nail polish.  Do not wear lotions, powders, or perfumes.  You may wear deodorant.  Do not shave 48 hours prior to surgery.  Men may shave face and neck.  Do not bring valuables to the hospital.  [facility] is not responsible for any belongings or valuables.  Contacts, dentures or bridgework may not be worn into surgery.  Leave your suitcase in the car.  After surgery it may be brought to your room.  For patients admitted to the hospital, discharge time will be determined by your treatment team.  Patients discharged the day of surgery will not be allowed to drive home.   Name and phone number of your driver:   Special instructions:  See preparing for surgery  Please read over the following  fact sheets that you were given. Pain Booklet, Coughing and Deep Breathing, MRSA Information and Surgical Site Infection Prevention

## 2015-10-05 NOTE — Progress Notes (Signed)
Anesthesia Chart Review:  Aaron Becker is 82 year old male scheduled for T12 kyphoplasty on 10/06/2015 with Dr. Hal Neer.   PMH includes:  HTN, SVT, DM, hyperlipidemia, syncope. Never smoker. BMI 25.5.  Medications include: benazepril, glimepiride, hctz, levothyroxine, metformin, pravastatin.  Preoperative labs reviewed.    EKG 10/05/15: NSR. Incomplete RBBB. Cannot rule out Anterior infarct, age undetermined  Nuclear stress test 09/07/14:  - No significant ischemia, no significant wall motion abnormality. EF 64%. LV global function normal. Overall this is a low risk scan.   Cardiac event monitor 08/25/14 (event monitor in media tab dated 08/25/14): NSR with rare APC. No other significant arrhythmia.   Echo 07/11/14 (found in AMB correspondence in media tab dated 07/26/14):  - Normal global LV systolic function. EF 60-65%. Impaired relaxation of LV diastolic filling.  - Normal RV size and systolic function.  - Mild AR - Mild dilatation of ascending aorta and aortic root - Normal RSVP  Reviewed case with Dr. Smith Robert.   If no changes, I anticipate Aaron Becker can proceed with surgery as scheduled.   Willeen Cass, FNP-BC [facility] Short Stay Surgical Center/Anesthesiology Phone: [phone] 10/05/2015 2:07 PM

## 2015-10-05 NOTE — Progress Notes (Signed)
   10/05/15 1200  OBSTRUCTIVE SLEEP APNEA  Have you ever been diagnosed with sleep apnea through a sleep study? No  Do you snore loudly (loud enough to be heard through closed doors)?  1  Do you often feel tired, fatigued, or sleepy during the daytime (such as falling asleep during driving or talking to someone)? 1  Has anyone observed you stop breathing during your sleep? 0  Do you have, or are you being treated for high blood pressure? 1  BMI more than 35 kg/m2? 0  Age > 50 (1-yes) 1  Neck circumference greater than:Male 16 inches or larger, Male 17inches or larger? 0  Male Gender (Yes=1) 1  Obstructive Sleep Apnea Score 5  Score 5 or greater  Results sent to PCP

## 2015-10-06 ENCOUNTER — Encounter (HOSPITAL_COMMUNITY)
Admission: RE | Disposition: A | Discharge status: Home / Self Care | Payer: Self-pay | Source: Ambulatory Visit | Attending: Neurosurgery

## 2015-10-06 ENCOUNTER — Ambulatory Visit (HOSPITAL_COMMUNITY): Payer: Medicare Other | Admitting: Certified Registered Nurse Anesthetist

## 2015-10-06 ENCOUNTER — Ambulatory Visit (HOSPITAL_COMMUNITY)
Admission: RE | Admit: 2015-10-06 | Discharge: 2015-10-06 | Disposition: A | Discharge status: Home / Self Care | Payer: Medicare Other | Source: Ambulatory Visit | Attending: Neurosurgery | Admitting: Neurosurgery

## 2015-10-06 ENCOUNTER — Ambulatory Visit (HOSPITAL_COMMUNITY): Payer: Medicare Other

## 2015-10-06 ENCOUNTER — Encounter (HOSPITAL_COMMUNITY): Payer: Self-pay | Admitting: Certified Registered Nurse Anesthetist

## 2015-10-06 ENCOUNTER — Ambulatory Visit (HOSPITAL_COMMUNITY): Payer: Medicare Other | Admitting: Emergency Medicine

## 2015-10-06 DIAGNOSIS — I10 Essential (primary) hypertension: Secondary | ICD-10-CM | POA: Insufficient documentation

## 2015-10-06 DIAGNOSIS — Z7984 Long term (current) use of oral hypoglycemic drugs: Secondary | ICD-10-CM | POA: Insufficient documentation

## 2015-10-06 DIAGNOSIS — E039 Hypothyroidism, unspecified: Secondary | ICD-10-CM | POA: Diagnosis not present

## 2015-10-06 DIAGNOSIS — M4854XA Collapsed vertebra, not elsewhere classified, thoracic region, initial encounter for fracture: Secondary | ICD-10-CM | POA: Diagnosis not present

## 2015-10-06 DIAGNOSIS — M1990 Unspecified osteoarthritis, unspecified site: Secondary | ICD-10-CM | POA: Insufficient documentation

## 2015-10-06 DIAGNOSIS — E119 Type 2 diabetes mellitus without complications: Secondary | ICD-10-CM | POA: Diagnosis not present

## 2015-10-06 DIAGNOSIS — Z419 Encounter for procedure for purposes other than remedying health state, unspecified: Secondary | ICD-10-CM

## 2015-10-06 DIAGNOSIS — E785 Hyperlipidemia, unspecified: Secondary | ICD-10-CM | POA: Insufficient documentation

## 2015-10-06 HISTORY — PX: KYPHOPLASTY: SHX5884

## 2015-10-06 LAB — GLUCOSE, CAPILLARY
GLUCOSE-CAPILLARY: 109 mg/dL — AB (ref 65–99)
Glucose-Capillary: 126 mg/dL — ABNORMAL HIGH (ref 65–99)
Glucose-Capillary: 116 mg/dL — ABNORMAL HIGH (ref 65–99)

## 2015-10-06 LAB — HEMOGLOBIN A1C
Hgb A1c MFr Bld: 7.8 % — ABNORMAL HIGH (ref 4.8–5.6)
Mean Plasma Glucose: 177 mg/dL

## 2015-10-06 SURGERY — KYPHOPLASTY
Anesthesia: General

## 2015-10-06 MED ORDER — SUCCINYLCHOLINE CHLORIDE 20 MG/ML IJ SOLN
INTRAMUSCULAR | Status: AC
  Filled 2015-10-06: qty 1

## 2015-10-06 MED ORDER — PHENYLEPHRINE HCL 10 MG/ML IJ SOLN
10.0000 mg | INTRAMUSCULAR | Status: DC | PRN
  Administered 2015-10-06: 20 ug/min via INTRAVENOUS

## 2015-10-06 MED ORDER — LACTATED RINGERS IV SOLN
INTRAVENOUS | Status: DC
  Administered 2015-10-06 (×2): via INTRAVENOUS

## 2015-10-06 MED ORDER — PROPOFOL 10 MG/ML IV BOLUS
INTRAVENOUS | Status: DC | PRN
  Administered 2015-10-06: 20 mg via INTRAVENOUS
  Administered 2015-10-06 (×2): 50 mg via INTRAVENOUS

## 2015-10-06 MED ORDER — ROCURONIUM BROMIDE 100 MG/10ML IV SOLN
INTRAVENOUS | Status: DC | PRN
  Administered 2015-10-06: 15 mg via INTRAVENOUS

## 2015-10-06 MED ORDER — FENTANYL CITRATE (PF) 250 MCG/5ML IJ SOLN
INTRAMUSCULAR | Status: AC
  Filled 2015-10-06: qty 5

## 2015-10-06 MED ORDER — CEFAZOLIN SODIUM-DEXTROSE 2-3 GM-% IV SOLR
2.0000 g | INTRAVENOUS | Status: AC
  Administered 2015-10-06: 2 g via INTRAVENOUS

## 2015-10-06 MED ORDER — SUCCINYLCHOLINE CHLORIDE 20 MG/ML IJ SOLN
INTRAMUSCULAR | Status: DC | PRN
  Administered 2015-10-06: 100 mg via INTRAVENOUS

## 2015-10-06 MED ORDER — ROCURONIUM BROMIDE 50 MG/5ML IV SOLN
INTRAVENOUS | Status: AC
  Filled 2015-10-06: qty 1

## 2015-10-06 MED ORDER — ONDANSETRON HCL 4 MG/2ML IJ SOLN
INTRAMUSCULAR | Status: DC | PRN
  Administered 2015-10-06: 4 mg via INTRAVENOUS

## 2015-10-06 MED ORDER — PHENYLEPHRINE 40 MCG/ML (10ML) SYRINGE FOR IV PUSH (FOR BLOOD PRESSURE SUPPORT)
PREFILLED_SYRINGE | INTRAVENOUS | Status: AC
  Filled 2015-10-06: qty 10

## 2015-10-06 MED ORDER — 0.9 % SODIUM CHLORIDE (POUR BTL) OPTIME
TOPICAL | Status: DC | PRN
Start: 2015-10-06 — End: 2015-10-06
  Administered 2015-10-06: 1000 mL

## 2015-10-06 MED ORDER — LIDOCAINE HCL (CARDIAC) 20 MG/ML IV SOLN
INTRAVENOUS | Status: AC
  Filled 2015-10-06: qty 5

## 2015-10-06 MED ORDER — PHENYLEPHRINE HCL 10 MG/ML IJ SOLN
INTRAMUSCULAR | Status: DC | PRN
  Administered 2015-10-06 (×2): 120 ug via INTRAVENOUS
  Administered 2015-10-06 (×2): 40 ug via INTRAVENOUS
  Administered 2015-10-06: 80 ug via INTRAVENOUS

## 2015-10-06 MED ORDER — BUPIVACAINE HCL (PF) 0.5 % IJ SOLN
INTRAMUSCULAR | Status: DC | PRN
  Administered 2015-10-06: 6 mL

## 2015-10-06 MED ORDER — ONDANSETRON HCL 4 MG/2ML IJ SOLN: 4.0000 mg | Freq: Once | INTRAMUSCULAR | Status: DC | PRN

## 2015-10-06 MED ORDER — LIDOCAINE HCL (CARDIAC) 20 MG/ML IV SOLN
INTRAVENOUS | Status: DC | PRN
  Administered 2015-10-06: 80 mg via INTRAVENOUS

## 2015-10-06 MED ORDER — FENTANYL CITRATE (PF) 100 MCG/2ML IJ SOLN: 25.0000 ug | INTRAMUSCULAR | Status: DC | PRN

## 2015-10-06 MED ORDER — GLYCOPYRROLATE 0.2 MG/ML IJ SOLN
INTRAMUSCULAR | Status: DC | PRN
  Administered 2015-10-06: 0.4 mg via INTRAVENOUS

## 2015-10-06 MED ORDER — NEOSTIGMINE METHYLSULFATE 10 MG/10ML IV SOLN
INTRAVENOUS | Status: DC | PRN
  Administered 2015-10-06: 2 mg via INTRAVENOUS

## 2015-10-06 MED ORDER — PHENYLEPHRINE HCL 10 MG/ML IJ SOLN
INTRAMUSCULAR | Status: AC
  Filled 2015-10-06: qty 1

## 2015-10-06 SURGICAL SUPPLY — 36 items
BANDAGE ADH SHEER 1  50/CT (GAUZE/BANDAGES/DRESSINGS) ×4 IMPLANT
BLADE CLIPPER SURG (BLADE) IMPLANT
BLADE SURG 15 STRL LF DISP TIS (BLADE) ×1 IMPLANT
BLADE SURG 15 STRL SS (BLADE) ×3
CEMENT BONE KYPHX HV R (Orthopedic Implant) ×2 IMPLANT
DRAPE C-ARM 42X72 X-RAY (DRAPES) ×6 IMPLANT
DRAPE INCISE IOBAN 66X45 STRL (DRAPES) ×3 IMPLANT
DRAPE LAPAROTOMY 100X72X124 (DRAPES) ×3 IMPLANT
DRAPE PROXIMA HALF (DRAPES) ×3 IMPLANT
DURAPREP 26ML APPLICATOR (WOUND CARE) ×3 IMPLANT
GAUZE SPONGE 4X4 16PLY XRAY LF (GAUZE/BANDAGES/DRESSINGS) ×3 IMPLANT
GLOVE ECLIPSE 8.0 STRL XLNG CF (GLOVE) ×3 IMPLANT
GLOVE EXAM NITRILE LRG STRL (GLOVE) IMPLANT
GLOVE EXAM NITRILE XL STR (GLOVE) IMPLANT
GLOVE EXAM NITRILE XS STR PU (GLOVE) IMPLANT
GOWN STRL REUS W/ TWL LRG LVL3 (GOWN DISPOSABLE) ×1 IMPLANT
GOWN STRL REUS W/ TWL XL LVL3 (GOWN DISPOSABLE) ×3 IMPLANT
GOWN STRL REUS W/TWL 2XL LVL3 (GOWN DISPOSABLE) ×1 IMPLANT
GOWN STRL REUS W/TWL LRG LVL3 (GOWN DISPOSABLE)
GOWN STRL REUS W/TWL XL LVL3 (GOWN DISPOSABLE) ×9
KIT BASIN OR (CUSTOM PROCEDURE TRAY) ×3 IMPLANT
KIT ROOM TURNOVER OR (KITS) ×3 IMPLANT
LIQUID BAND (GAUZE/BANDAGES/DRESSINGS) IMPLANT
NDL HYPO 25X1 1.5 SAFETY (NEEDLE) IMPLANT
NEEDLE HYPO 25X1 1.5 SAFETY (NEEDLE) ×3 IMPLANT
NS IRRIG 1000ML POUR BTL (IV SOLUTION) ×3 IMPLANT
PACK EENT II TURBAN DRAPE (CUSTOM PROCEDURE TRAY) ×3 IMPLANT
PAD ARMBOARD 7.5X6 YLW CONV (MISCELLANEOUS) ×3 IMPLANT
SPECIMEN JAR SMALL (MISCELLANEOUS) IMPLANT
STAPLER SKIN PROX WIDE 3.9 (STAPLE) ×3 IMPLANT
SUT VIC AB 3-0 CP2 18 (SUTURE) ×2 IMPLANT
SUT VIC AB 3-0 SH 8-18 (SUTURE) ×2 IMPLANT
SYR CONTROL 10ML LL (SYRINGE) ×2 IMPLANT
TOWEL OR 17X24 6PK STRL BLUE (TOWEL DISPOSABLE) ×3 IMPLANT
TOWEL OR 17X26 10 PK STRL BLUE (TOWEL DISPOSABLE) ×3 IMPLANT
TRAY KYPHOPAK 20/3 ONESTEP 1ST (MISCELLANEOUS) ×2 IMPLANT

## 2015-10-06 NOTE — H&P (Signed)
Aaron Becker is an 82 y.o. male.   Chief Complaint: T12 compression fracture HPI: The patient is an 82 year old gentleman who is evaluated in the office for lower back pain. He's had the [facility] for a few weeks and it came on suddenly. He's had 2 back surgeries in the past on this started he saw his medical doctor get x-rays referred for evaluation. The patient was taking pain medication. After evaluation the office he underwent an MRI scan which showed that the abnormality T12 was at least subacute with persistent edema within the bone. We discussed the options the patient requested surgery now comes for a T12 vertebroplasty. I've had a long discussion with him regarding the risks and benefits of surgical intervention. The risks discussed include but are not limited to bleeding infection weakness numbness paralysis coma and death. We have discussed alternative methods of therapy along with the risks and benefits of nonintervention. He's had the opportunity to ask numerous questions and appears to understand. With this information in hand he has requested we proceed with surgery.  Past Medical History  Diagnosis Date  . Diabetes mellitus, type II ([place]) 1986  . Hypertension 02/02  . Hypothyroidism 1980's  . Elevated PSA 2011    per Dr. Jacqlyn Larsen with Uro  . Hyperlipidemia   . Syncope and collapse   . Palpitations   . SVT (supraventricular tachycardia) ([place])     a. reported SVT during admission at [facility] 06/2014  . Right rib fracture   . Bradycardia   . Colon polyp   . Arthritis     Past Surgical History  Procedure Laterality Date  . Cholecystectomy  1967  . Back surgery  last 1978    x 3  . Varicose vein surgery  1975  . Vasectomy  1975  . Carotid ultrasound  02/02    [facility]  . Cystectomy  06/14/04    mucous cyst excision with left thumb, IP joint debridement  . Prostate biopsy  11/01/08    Dr. Reece Agar  . Cyst excision      thumb  . Hernia repair    . Colonoscopy w/ polypectomy  2012     Villous adenoma from the rectum without high-grade dysplasia, 10 mm  . Colonoscopy with propofol N/A 03/30/2015    Procedure: COLONOSCOPY WITH PROPOFOL;  Surgeon: Robert Bellow, MD;  Location: [facility] ENDOSCOPY;  Service: Endoscopy;  Laterality: N/A;    Family History  Problem Relation Age of Onset  . Cancer Father     liver  . Cancer Sister     breast  . Cancer Brother     skin  . Cancer Sister     breast  . Heart disease Sister     pacer  . Colon cancer Neg Hx   . Prostate cancer Neg Hx    Social History:  reports that he has never smoked. He has never used smokeless tobacco. He reports that he does not drink alcohol or use illicit drugs.  Allergies:  Allergies  Allergen Reactions  . Flomax [Tamsulosin Hcl] Other (See Comments)    syncope  . Ibuprofen Other (See Comments)    Oral blisters at high doses  . Rosuvastatin     REACTION: muscle stiffness  . Sulfonamide Derivatives     REACTION: unspecified reaction    Medications Prior to Admission  Medication Sig Dispense Refill  . Alcohol Swabs (B-D SINGLE USE SWABS REGULAR) PADS USE AS DIRECTED TO TEST BLOOD SUGAR ONCE DAILY 100 each  PRN  . azithromycin (ZITHROMAX) 250 MG tablet Take 2 tablets by mouth today, then 1 tablet daily for 4 additional days. 6 each 0  . benazepril (LOTENSIN) 5 MG tablet Take 1 tablet (5 mg total) by mouth daily. 90 tablet 3  . benzonatate (TESSALON) 200 MG capsule Take 1 capsule (200 mg total) by mouth 3 (three) times daily as needed. 30 capsule 1  . Chlorpheniramine-Acetaminophen (CORICIDIN HBP COLD/FLU PO) Take 1 Dose by mouth daily as needed.    . diphenhydrAMINE (BENADRYL) 25 mg capsule Take 25 mg by mouth daily.    . folic acid (FOLVITE) A999333 MCG tablet Take 400 mcg by mouth daily.      Marland Kitchen glimepiride (AMARYL) 1 MG tablet Take 2 tablets (2 mg total) by mouth daily with breakfast. 180 tablet 3  . glucose blood (ONE TOUCH ULTRA TEST) test strip USE AS DIRECTED TO TEST BLOOD SUGAR ONCE DAILY. DX.  E11.9 100 each 3  . guaiFENesin (MUCINEX) 600 MG 12 hr tablet Take 600 mg by mouth 2 (two) times daily as needed for cough or to loosen phlegm.    . hydrochlorothiazide (HYDRODIURIL) 12.5 MG tablet TAKE 0.5-1 TABLETS (6.25-12.5 MG TOTAL) BY MOUTH DAILY AS NEEDED (FOR SWELLING). 90 tablet 3  . HYDROcodone-acetaminophen (NORCO/VICODIN) 5-325 MG tablet Take 0.5-1 tablets by mouth every 6 (six) hours as needed for moderate pain (sedation caution). 20 tablet 0  . levothyroxine (SYNTHROID, LEVOTHROID) 75 MCG tablet TAKE 1 TABLET BY MOUTH EVERY DAY 30 tablet 5  . metFORMIN (GLUCOPHAGE) 500 MG tablet TAKE 1 TABLET BY MOUTH EVERY MORNING, 2 TABLETS AT SUPPER , THEN 1 EXTRA TAB AT NIGHT IF NEEDED 120 tablet 12  . Multiple Vitamins-Minerals (CENTRUM SILVER PO) Take by mouth daily.      . Omega-3 Fatty Acids (FISH OIL) 1000 MG CAPS Take by mouth daily.      . pravastatin (PRAVACHOL) 80 MG tablet TAKE 1 TABLET (80 MG TOTAL) BY MOUTH DAILY. 30 tablet 10  . vitamin C (ASCORBIC ACID) 500 MG tablet Take 500 mg by mouth daily as needed.     . hydrochlorothiazide (HYDRODIURIL) 12.5 MG tablet Take 1 tablet (12.5 mg total) by mouth daily as needed. (Patient not taking: Reported on 10/05/2015)      Results for orders placed or performed during the hospital encounter of 10/06/15 (from the past 48 hour(s))  Glucose, capillary     Status: Abnormal   Collection Time: 10/06/15 10:38 AM  Result Value Ref Range   Glucose-Capillary 126 (H) 65 - 99 mg/dL  Glucose, capillary     Status: Abnormal   Collection Time: 10/06/15 12:45 PM  Result Value Ref Range   Glucose-Capillary 116 (H) 65 - 99 mg/dL   No results found.  Pertinent items are noted in HPI.  Blood pressure 146/72, pulse 72, temperature 97.5 F (36.4 C), temperature source Oral, resp. rate 20, weight 71.668 kg (158 lb), SpO2 100 %.  The patient is awake alert and oriented. He has tenderness his lower back. She has decreased reflexes are normal  strength. Assessment/Plan Impression is that of a compression fracture T12. The plan is for a T12 vertebroplasty.  Faythe Ghee, MD 10/06/2015, 1:38 PM

## 2015-10-06 NOTE — Op Note (Signed)
Preop diagnosis: Compression fracture T12 Postop diagnosis: Same Procedure: T12 vertebroplasty Surgeon: Iva Posten  After and placed the prone position the patient's back was prepped and draped in the usual sterile fashion. AP and lateral fluoroscopy was brought into the field and aligned with the appropriate level at T 12. We passed a Jamshidi needle through the left pedicle without difficulty from lateral to medial direction. We then drilled the cannula once we remove the inner part of the Jamshidi needle. We then injected approximately 5 mL of cement which gave excellent fill, particularly along the superior endplate all the way across the vertebral body. We felt that we had adequate fill we withdrew the Jamshidi needle. Final fluoroscopy in AP lateral direction looked excellent. We washed the incision and then put 2 staples. We placed a Band-Aid over the incision extubated the patient and took him to recovery room in stable condition.

## 2015-10-06 NOTE — Anesthesia Postprocedure Evaluation (Signed)
Anesthesia Post Note  Patient: Aaron Becker  Procedure(s) Performed: Procedure(s) (LRB): Thoracic twelve kyphoplasty (N/A)  Patient location during evaluation: PACU Anesthesia Type: General Level of consciousness: awake and alert, awake, patient cooperative and oriented Pain management: pain level controlled Vital Signs Assessment: post-procedure vital signs reviewed and stable Respiratory status: spontaneous breathing, nonlabored ventilation, respiratory function stable and patient connected to nasal cannula oxygen Cardiovascular status: blood pressure returned to baseline and stable Postop Assessment: no signs of nausea or vomiting Anesthetic complications: no    Last Vitals:  Filed Vitals:   10/06/15 1455 10/06/15 1508  BP: 135/92 136/80  Pulse: 81 85  Temp: 36.7 C   Resp: 34 14    Last Pain:  Filed Vitals:   10/06/15 1528  PainSc: 0-No pain                 Catalina Gravel

## 2015-10-06 NOTE — Anesthesia Preprocedure Evaluation (Addendum)
Anesthesia Evaluation  Patient identified by MRN, date of birth, ID band Patient awake    Reviewed: Allergy & Precautions, NPO status , Patient's Chart, lab work & pertinent test results  History of Anesthesia Complications Negative for: history of anesthetic complications  Airway Mallampati: II  TM Distance: >3 FB Neck ROM: Full    Dental  (+) Dental Advisory Given, Edentulous Upper, Edentulous Lower, Upper Dentures, Lower Dentures   Pulmonary neg pulmonary ROS,    Pulmonary exam normal breath sounds clear to auscultation       Cardiovascular hypertension, Pt. on medications (-) angina(-) CAD, (-) Past MI and (-) CHF Normal cardiovascular exam+ dysrhythmias (history of SVT in 2015)  Rhythm:Regular Rate:Normal     Neuro/Psych negative neurological ROS  negative psych ROS   GI/Hepatic negative GI ROS, Neg liver ROS,   Endo/Other  diabetes, Type 2, Oral Hypoglycemic AgentsHypothyroidism   Renal/GU negative Renal ROS     Musculoskeletal  (+) Arthritis , Osteoarthritis,    Abdominal   Peds  Hematology negative hematology ROS (+)   Anesthesia Other Findings Day of surgery medications reviewed with the patient.  Reproductive/Obstetrics                          Anesthesia Physical Anesthesia Plan  ASA: III  Anesthesia Plan: General   Post-op Pain Management:    Induction: Intravenous  Airway Management Planned: Oral ETT  Additional Equipment:   Intra-op Plan:   Post-operative Plan: Extubation in OR  Informed Consent: I have reviewed the patients History and Physical, chart, labs and discussed the procedure including the risks, benefits and alternatives for the proposed anesthesia with the patient or authorized representative who has indicated his/her understanding and acceptance.   Dental advisory given  Plan Discussed with: CRNA  Anesthesia Plan Comments: (Risks/benefits of  general anesthesia discussed with patient including risk of damage to teeth, lips, gum, and tongue, nausea/vomiting, allergic reactions to medications, and the possibility of heart attack, stroke and death.  All patient questions answered.  Patient wishes to proceed.)        Anesthesia Quick Evaluation                                  Anesthesia Evaluation  Patient identified by MRN, date of birth, ID band Patient awake    Reviewed: Allergy & Precautions, H&P , NPO status , Patient's Chart, lab work & pertinent test results  History of Anesthesia Complications Negative for: history of anesthetic complications  Airway Mallampati: II  TM Distance: >3 FB Neck ROM: full    Dental no notable dental hx. (+) Upper Dentures, Lower Dentures   Pulmonary neg pulmonary ROS,  breath sounds clear to auscultation  Pulmonary exam normal       Cardiovascular hypertension, negative cardio ROS Normal cardiovascular examRhythm:regular Rate:Normal     Neuro/Psych negative neurological ROS  negative psych ROS   GI/Hepatic negative GI ROS, Neg liver ROS,   Endo/Other  negative endocrine ROSdiabetes, Well Controlled, Type 2Hypothyroidism   Renal/GU negative Renal ROS  negative genitourinary   Musculoskeletal   Abdominal   Peds  Hematology negative hematology ROS (+)   Anesthesia Other Findings   Reproductive/Obstetrics negative OB ROS                            Anesthesia Physical Anesthesia Plan  ASA: III  Anesthesia Plan: General   Post-op Pain Management:    Induction:   Airway Management Planned:   Additional Equipment:   Intra-op Plan:   Post-operative Plan:   Informed Consent: I have reviewed the patients History and Physical, chart, labs and discussed the procedure including the risks, benefits and alternatives for the proposed anesthesia with the patient or authorized representative who has indicated his/her understanding  and acceptance.     Plan Discussed with: Anesthesiologist, CRNA and Surgeon  Anesthesia Plan Comments:         Anesthesia Quick Evaluation

## 2015-10-06 NOTE — Anesthesia Procedure Notes (Signed)
Procedure Name: Intubation Date/Time: 10/06/2015 1:56 PM Performed by: Scheryl Darter Pre-anesthesia Checklist: Patient identified, Emergency Drugs available, Suction available, Patient being monitored and Timeout performed Patient Re-evaluated:Patient Re-evaluated prior to inductionOxygen Delivery Method: Circle system utilized Preoxygenation: Pre-oxygenation with 100% oxygen Intubation Type: IV induction Ventilation: Mask ventilation without difficulty Laryngoscope Size: Mac and 4 Grade View: Grade I Tube type: Oral Tube size: 7.5 mm Number of attempts: 1 Airway Equipment and Method: Stylet Placement Confirmation: ETT inserted through vocal cords under direct vision,  positive ETCO2 and breath sounds checked- equal and bilateral Secured at: 21 cm Tube secured with: Tape Dental Injury: Teeth and Oropharynx as per pre-operative assessment

## 2015-10-06 NOTE — Transfer of Care (Signed)
Immediate Anesthesia Transfer of Care Note  Patient: Aaron Becker  Procedure(s) Performed: Procedure(s) with comments: Thoracic twelve kyphoplasty (N/A) - T12 Kyphoplasty  Patient Location: PACU  Anesthesia Type:General  Level of Consciousness: awake, alert , oriented and sedated  Airway & Oxygen Therapy: Patient Spontanous Breathing and Patient connected to nasal cannula oxygen  Post-op Assessment: Report given to RN, Post -op Vital signs reviewed and stable and Patient moving all extremities  Post vital signs: Reviewed and stable  Last Vitals:  Filed Vitals:   10/06/15 1029 10/06/15 1455  BP: 146/72   Pulse: 72   Temp: 36.4 C 36.7 C  Resp: 20     Complications: No apparent anesthesia complications

## 2015-10-07 ENCOUNTER — Encounter (HOSPITAL_COMMUNITY): Payer: Self-pay | Admitting: Neurosurgery

## 2015-10-28 ENCOUNTER — Other Ambulatory Visit: Payer: Self-pay | Admitting: *Deleted

## 2015-10-28 MED ORDER — LEVOTHYROXINE SODIUM 75 MCG PO TABS: 75.0000 ug | ORAL_TABLET | Freq: Every day | ORAL | Status: DC

## 2015-10-28 MED ORDER — HYDROCHLOROTHIAZIDE 12.5 MG PO TABS: ORAL_TABLET | ORAL | Status: DC

## 2015-10-28 NOTE — Telephone Encounter (Signed)
Pt's wife is requesting refill of HCTZ and synthroid, pt has changed pharmacy and he is using [facility] on [address], Rx request sent to new pharmacy

## 2015-12-08 ENCOUNTER — Other Ambulatory Visit: Payer: Self-pay | Admitting: Family Medicine

## 2015-12-08 DIAGNOSIS — E119 Type 2 diabetes mellitus without complications: Secondary | ICD-10-CM

## 2015-12-09 ENCOUNTER — Other Ambulatory Visit: Payer: Self-pay | Admitting: *Deleted

## 2015-12-09 MED ORDER — METFORMIN HCL 500 MG PO TABS: ORAL_TABLET | ORAL | Status: DC

## 2015-12-09 NOTE — Telephone Encounter (Signed)
Received faxed refill request from pharmacy. Refill sent to pharmacy electronically. 

## 2015-12-09 NOTE — Telephone Encounter (Signed)
V/M left requesting status of refill metformin. Left v/m advising refill done at [facility] [address]

## 2015-12-12 ENCOUNTER — Other Ambulatory Visit (INDEPENDENT_AMBULATORY_CARE_PROVIDER_SITE_OTHER): Payer: Medicare Other

## 2015-12-12 DIAGNOSIS — E119 Type 2 diabetes mellitus without complications: Secondary | ICD-10-CM

## 2015-12-12 LAB — COMPREHENSIVE METABOLIC PANEL
ALBUMIN: 4.2 g/dL (ref 3.5–5.2)
ALK PHOS: 53 U/L (ref 39–117)
ALT: 30 U/L (ref 0–53)
AST: 26 U/L (ref 0–37)
BILIRUBIN TOTAL: 0.8 mg/dL (ref 0.2–1.2)
BUN: 20 mg/dL (ref 6–23)
CO2: 29 mEq/L (ref 19–32)
Calcium: 9.8 mg/dL (ref 8.4–10.5)
Chloride: 103 mEq/L (ref 96–112)
Creatinine, Ser: 1.19 mg/dL (ref 0.40–1.50)
GFR: 61.98 mL/min (ref >60.00)
Glucose, Bld: 118 mg/dL — ABNORMAL HIGH (ref 70–99)
POTASSIUM: 4.5 meq/L (ref 3.5–5.1)
Sodium: 139 mEq/L (ref 135–145)
TOTAL PROTEIN: 7.1 g/dL (ref 6.0–8.3)

## 2015-12-12 LAB — LIPID PANEL
CHOLESTEROL: 117 mg/dL (ref 0–200)
HDL: 51.5 mg/dL (ref >39.00)
LDL Cholesterol: 45 mg/dL (ref 0–99)
NONHDL: 65.06
Total CHOL/HDL Ratio: 2
Triglycerides: 99 mg/dL (ref 0.0–149.0)
VLDL: 19.8 mg/dL (ref 0.0–40.0)

## 2015-12-12 LAB — HEMOGLOBIN A1C: HEMOGLOBIN A1C: 6.9 % — AB (ref 4.6–6.5)

## 2015-12-12 LAB — TSH: TSH: 1.16 u[IU]/mL (ref 0.35–4.50)

## 2015-12-16 ENCOUNTER — Encounter: Payer: Medicare Other | Admitting: Family Medicine

## 2015-12-20 ENCOUNTER — Other Ambulatory Visit: Payer: Self-pay

## 2015-12-20 MED ORDER — PRAVASTATIN SODIUM 80 MG PO TABS: ORAL_TABLET | ORAL | Status: DC

## 2015-12-20 MED ORDER — GLIMEPIRIDE 1 MG PO TABS: 2.0000 mg | ORAL_TABLET | Freq: Every day | ORAL | Status: DC

## 2015-12-20 NOTE — Telephone Encounter (Signed)
Rxs sent electronically.  

## 2015-12-28 ENCOUNTER — Ambulatory Visit (INDEPENDENT_AMBULATORY_CARE_PROVIDER_SITE_OTHER): Payer: Medicare Other

## 2015-12-28 ENCOUNTER — Ambulatory Visit (INDEPENDENT_AMBULATORY_CARE_PROVIDER_SITE_OTHER): Payer: Medicare Other | Admitting: Family Medicine

## 2015-12-28 ENCOUNTER — Encounter: Payer: Self-pay | Admitting: Family Medicine

## 2015-12-28 VITALS — BP 142/80 | HR 70 | Temp 98.5°F | Ht 65.75 in | Wt 149.8 lb

## 2015-12-28 VITALS — BP 144/78 | HR 70 | Temp 98.5°F | Ht 65.75 in | Wt 149.8 lb

## 2015-12-28 DIAGNOSIS — E039 Hypothyroidism, unspecified: Secondary | ICD-10-CM

## 2015-12-28 DIAGNOSIS — E7800 Pure hypercholesterolemia, unspecified: Secondary | ICD-10-CM | POA: Diagnosis not present

## 2015-12-28 DIAGNOSIS — Z0000 Encounter for general adult medical examination without abnormal findings: Secondary | ICD-10-CM | POA: Diagnosis not present

## 2015-12-28 DIAGNOSIS — M545 Low back pain, unspecified: Secondary | ICD-10-CM

## 2015-12-28 DIAGNOSIS — E119 Type 2 diabetes mellitus without complications: Secondary | ICD-10-CM | POA: Diagnosis not present

## 2015-12-28 DIAGNOSIS — I10 Essential (primary) hypertension: Secondary | ICD-10-CM | POA: Diagnosis not present

## 2015-12-28 MED ORDER — METFORMIN HCL 500 MG PO TABS: ORAL_TABLET | ORAL | Status: DC

## 2015-12-28 MED ORDER — PRAVASTATIN SODIUM 80 MG PO TABS
ORAL_TABLET | ORAL | Status: DC
Start: 2015-12-28 — End: 2017-02-11

## 2015-12-28 MED ORDER — HYDROCHLOROTHIAZIDE 12.5 MG PO TABS: ORAL_TABLET | ORAL | Status: DC

## 2015-12-28 MED ORDER — GLIMEPIRIDE 1 MG PO TABS: 2.0000 mg | ORAL_TABLET | Freq: Every day | ORAL | Status: DC

## 2015-12-28 MED ORDER — LEVOTHYROXINE SODIUM 75 MCG PO TABS: 75.0000 ug | ORAL_TABLET | Freq: Every day | ORAL | Status: DC

## 2015-12-28 MED ORDER — BENAZEPRIL HCL 5 MG PO TABS: 5.0000 mg | ORAL_TABLET | Freq: Every day | ORAL | Status: DC

## 2015-12-28 NOTE — Progress Notes (Signed)
Subjective:   Aaron Becker is a 82 y.o. male who presents for Medicare Annual/Subsequent preventive examination.  Cardiac Risk Factors include: advanced age (>70men, >22 women);diabetes mellitus;hypertension;dyslipidemia     Objective:    Vitals: BP 142/80 mmHg  Pulse 70  Temp(Src) 98.5 F (36.9 C) (Oral)  Ht 5' 5.75" (1.67 m)  Wt 149 lb 12 oz (67.926 kg)  BMI 24.36 kg/m2  SpO2 97%  Body mass index is 24.36 kg/(m^2).  Tobacco History  Smoking status  . Never Smoker   Smokeless tobacco  . Never Used     Counseling given: No   Past Medical History  Diagnosis Date  . Diabetes mellitus, type II ([place]) 1986  . Hypertension 02/02  . Hypothyroidism 1980's  . Elevated PSA 2011    per Dr. Jacqlyn Larsen with Uro  . Hyperlipidemia   . Syncope and collapse   . Palpitations   . SVT (supraventricular tachycardia) ([place])     a. reported SVT during admission at [facility] 06/2014  . Right rib fracture   . Bradycardia   . Colon polyp   . Arthritis    Past Surgical History  Procedure Laterality Date  . Cholecystectomy  1967  . Back surgery  last 1978    x 3  . Varicose vein surgery  1975  . Vasectomy  1975  . Carotid ultrasound  02/02    wnl  . Cystectomy  06/14/04    mucous cyst excision with left thumb, IP joint debridement  . Prostate biopsy  11/01/08    Dr. Reece Agar  . Cyst excision      thumb  . Hernia repair    . Colonoscopy w/ polypectomy  2012    Villous adenoma from the rectum without high-grade dysplasia, 10 mm  . Colonoscopy with propofol N/A 03/30/2015    Procedure: COLONOSCOPY WITH PROPOFOL;  Surgeon: Robert Bellow, MD;  Location: [facility] ENDOSCOPY;  Service: Endoscopy;  Laterality: N/A;  . Kyphoplasty N/A 10/06/2015    Procedure: Thoracic twelve kyphoplasty;  Surgeon: Karie Chimera, MD;  Location: [place] NEURO ORS;  Service: Neurosurgery;  Laterality: N/A;  T12 Kyphoplasty   Family History  Problem Relation Age of Onset  . Cancer Father     liver  . Cancer Sister     breast  . Cancer Brother     skin  . Cancer Sister     breast  . Heart disease Sister     pacer  . Colon cancer Neg Hx   . Prostate cancer Neg Hx    History  Sexual Activity  . Sexual Activity: No    Outpatient Encounter Prescriptions as of 12/28/2015  Medication Sig  . Alcohol Swabs (B-D SINGLE USE SWABS REGULAR) PADS USE AS DIRECTED TO TEST BLOOD SUGAR ONCE DAILY  . azithromycin (ZITHROMAX) 250 MG tablet Take 2 tablets by mouth today, then 1 tablet daily for 4 additional days.  . benazepril (LOTENSIN) 5 MG tablet Take 1 tablet (5 mg total) by mouth daily.  . benzonatate (TESSALON) 200 MG capsule Take 1 capsule (200 mg total) by mouth 3 (three) times daily as needed.  . Chlorpheniramine-Acetaminophen (CORICIDIN HBP COLD/FLU PO) Take 1 Dose by mouth daily as needed.  . diphenhydrAMINE (BENADRYL) 25 mg capsule Take 25 mg by mouth daily.  . folic acid (FOLVITE) A999333 MCG tablet Take 400 mcg by mouth daily.    Marland Kitchen glimepiride (AMARYL) 1 MG tablet Take 2 tablets (2 mg total) by mouth daily with breakfast.  .  glucose blood (ONE TOUCH ULTRA TEST) test strip USE AS DIRECTED TO TEST BLOOD SUGAR ONCE DAILY. DX. E11.9  . guaiFENesin (MUCINEX) 600 MG 12 hr tablet Take 600 mg by mouth 2 (two) times daily as needed for cough or to loosen phlegm.  . hydrochlorothiazide (HYDRODIURIL) 12.5 MG tablet TAKE 0.5-1 TABLETS (6.25-12.5 MG TOTAL) BY MOUTH DAILY AS NEEDED (FOR SWELLING).  Marland Kitchen HYDROcodone-acetaminophen (NORCO/VICODIN) 5-325 MG tablet Take 0.5-1 tablets by mouth every 6 (six) hours as needed for moderate pain (sedation caution).  Marland Kitchen levothyroxine (SYNTHROID, LEVOTHROID) 75 MCG tablet Take 1 tablet (75 mcg total) by mouth daily.  . metFORMIN (GLUCOPHAGE) 500 MG tablet TAKE 1 TABLET BY MOUTH EVERY MORNING, 2 TABLETS AT SUPPER , THEN 1 EXTRA TAB AT NIGHT IF NEEDED  . Multiple Vitamins-Minerals (CENTRUM SILVER PO) Take by mouth daily.    . Omega-3 Fatty Acids (FISH OIL) 1000 MG CAPS Take by mouth daily.      . pravastatin (PRAVACHOL) 80 MG tablet TAKE 1 TABLET (80 MG TOTAL) BY MOUTH DAILY.  . vitamin C (ASCORBIC ACID) 500 MG tablet Take 500 mg by mouth daily as needed.    No facility-administered encounter medications on file as of 12/28/2015.    Activities of Daily Living In your present state of health, do you have any difficulty performing the following activities: 12/28/2015 12/28/2015  Hearing? N -  Vision? N -  Difficulty concentrating or making decisions? N -  Walking or climbing stairs? Y -  Dressing or bathing? N -  Doing errands, shopping? N -  Preparing Food and eating ? N -  Using the Toilet? N -  In the past six months, have you accidently leaked urine? Y Y  Do you have problems with loss of bowel control? N -  Managing your Medications? N -  Managing your Finances? N -  Housekeeping or managing your Housekeeping? N -    Patient Care Team: Tonia Ghent, MD as PCP - General (Family Medicine) Robert Bellow, MD (General Surgery) Modesto Charon, MD (Family Medicine)   Assessment:    Hearing Screening Comments: Wears bilateral hearing aids Vision Screening Comments: Next eye exam scheduled 12/29/2015  Exercise Activities and Dietary recommendations Current Exercise Habits: Home exercise routine, Type of exercise: Other - see comments (gardening; yard work), Time (Minutes): 60, Frequency (Times/Week): 7, Weekly Exercise (Minutes/Week): 420, Intensity: Moderate, Exercise limited by: None identified  Goals    . Increase physical activity     Starting 12/28/2015, I will continue to garden and to do yard work for at least 60 min daily as weather permits.       Fall Risk Fall Risk  12/28/2015 12/10/2013 12/04/2012  Falls in the past year? No No No   Depression Screen PHQ 2/9 Scores 12/28/2015 12/10/2013 12/04/2012  PHQ - 2 Score 0 0 0    Cognitive Testing MMSE - Mini Mental State Exam 12/28/2015  Orientation to time 5  Orientation to Place 5  Registration 3  Attention/  Calculation 0  Recall 3  Language- name 2 objects 0  Language- repeat 1  Language- follow 3 step command 3  Language- read & follow direction 0  Write a sentence 0  Copy design 0  Total score 20   PLEASE NOTE: A Mini-Cog screen was completed. Maximum score is 20. A value of 0 denotes this part of [place]stein MMSE was not completed.  Orientation to Time - Max 5 Orientation to Place - Max 5 Registration -  Max 3 Recall - Max 3 Language Repeat - Max 1 Language Follow 3 Step Command - Max 3   Immunization History  Administered Date(s) Administered  . Influenza Split 06/19/2011, 06/05/2012  . Influenza Whole 06/24/2006, 06/26/2007, 06/21/2008, 06/23/2009, 06/13/2010  . Influenza,inj,Quad PF,36+ Mos 06/09/2013, 06/14/2014  . Pneumococcal Conjugate-13 06/14/2014  . Pneumococcal Polysaccharide-23 07/31/2000  . Td 01/12/2004, 05/16/2009  . Zoster 01/16/2012   Screening Tests Health Maintenance  Topic Date Due  . FOOT EXAM - will complete at next appt 12/14/2015  . OPHTHALMOLOGY EXAM - scheduled 12/29/15 01/23/2016 (Originally 08/31/2015)  . INFLUENZA VACCINE  04/24/2016  . HEMOGLOBIN A1C  06/13/2016  . TETANUS/TDAP  05/17/2019  . ZOSTAVAX  Completed  . PNA vac Low Risk Adult  Completed      Plan:     I have personally reviewed and addressed the Medicare Annual Wellness questionnaire and have noted the following in the patient's chart:  A. Medical and social history B. Use of alcohol, tobacco or illicit drugs  C. Current medications and supplements D. Functional ability and status E.  Nutritional status F.  Physical activity G. Advance directives H. List of other physicians I.  Hospitalizations, surgeries, and ER visits in previous 12 months J.  [place] to include hearing, vision, cognitive, depression L. Referrals and appointments - none  In addition, I have reviewed and discussed with patient certain preventive protocols, quality metrics, and best practice  recommendations. A written personalized care plan for preventive services as well as general preventive health recommendations were provided to patient.  See attached scanned questionnaire for additional information.   Signed,   Lindell Noe, MHA, BS, LPN Health Advisor [id number]  I reviewed health advisor's note, was available for consultation on the day of service listed in this note, and agree with documentation and plan. Elsie Stain, MD.

## 2015-12-28 NOTE — Assessment & Plan Note (Signed)
Controlled, continue as is.  Labs d/w pt.  No change in meds.  

## 2015-12-28 NOTE — Assessment & Plan Note (Signed)
Reasonably controlled, I don't want to induce hypotension, continue as is.  Labs d/w pt. No change in meds.

## 2015-12-28 NOTE — Patient Instructions (Signed)
Don't change your meds for now.  If you want to go to PT to get your back stronger then let me know.  Recheck in about 6 months.  We can do labs at the visit if that is better for you.  You won't need to fast.  Take care.  Glad to see you.

## 2015-12-28 NOTE — Progress Notes (Signed)
Pre visit review using our clinic review tool, if applicable. No additional management support is needed unless otherwise documented below in the visit note. 

## 2015-12-28 NOTE — Assessment & Plan Note (Signed)
Much improved after kyphoplasty.  D/w pt.  Offered PT re: back strengthening to help with gait changes from kyphosis, patient declined for now.  He'll update me as needed.

## 2015-12-28 NOTE — Patient Instructions (Signed)
Aaron Becker , Thank you for taking time to come for your Medicare Wellness Visit. I appreciate your ongoing commitment to your health goals. Please review the following plan we discussed and let me know if I can assist you in the future.   These are the goals we discussed: Goals    . Increase physical activity     Starting 12/28/2015, I will continue to garden and to do yard work for at least 60 min daily as weather permits.        This is a list of the screening recommended for you and due dates:  Health Maintenance  Topic Date Due  . Complete foot exam   12/14/2015  . Eye exam for diabetics  01/23/2016*  . Flu Shot  04/24/2016  . Hemoglobin A1C  06/13/2016  . Tetanus Vaccine  05/17/2019  . Shingles Vaccine  Completed  . Pneumonia vaccines  Completed  *Topic was postponed. The date shown is not the original due date.    Preventive Care for Adults  A healthy lifestyle and preventive care can promote health and wellness. Preventive health guidelines for adults include the following key practices.  . A routine yearly physical is a good way to check with your health care provider about your health and preventive screening. It is a chance to share any concerns and updates on your health and to receive a thorough exam.  . Visit your dentist for a routine exam and preventive care every 6 months. Brush your teeth twice a day and floss once a day. Good oral hygiene prevents tooth decay and gum disease.  . The frequency of eye exams is based on your age, health, family medical history, use  of contact lenses, and other factors. Follow your health care provider's ecommendations for frequency of eye exams.  . Eat a healthy diet. Foods like vegetables, fruits, whole grains, low-fat dairy products, and lean protein foods contain the nutrients you need without too many calories. Decrease your intake of foods high in solid fats, added sugars, and salt. Eat the right amount of calories for you. Get  information about a proper diet from your health care provider, if necessary.  . Regular physical exercise is one of the most important things you can do for your health. Most adults should get at least 150 minutes of moderate-intensity exercise (any activity that increases your heart rate and causes you to sweat) each week. In addition, most adults need muscle-strengthening exercises on 2 or more days a week.  Silver Sneakers may be a benefit available to you. To determine eligibility, you may visit the website: www.silversneakers.com or contact program at (213) 343-4499 Mon-Fri between 8AM-8PM.   . Maintain a healthy weight. The body mass index (BMI) is a screening tool to identify possible weight problems. It provides an estimate of body fat based on height and weight. Your health care provider can find your BMI and can help you achieve or maintain a healthy weight.   For adults 20 years and older: ? A BMI below 18.5 is considered underweight. ? A BMI of 18.5 to 24.9 is normal. ? A BMI of 25 to 29.9 is considered overweight. ? A BMI of 30 and above is considered obese.   . Maintain normal blood lipids and cholesterol levels by exercising and minimizing your intake of saturated fat. Eat a balanced diet with plenty of fruit and vegetables. Blood tests for lipids and cholesterol should begin at age 6 and be repeated every 5 years.  If your lipid or cholesterol levels are high, you are over 50, or you are at high risk for heart disease, you may need your cholesterol levels checked more frequently. Ongoing high lipid and cholesterol levels should be treated with medicines if diet and exercise are not working.  . If you smoke, find out from your health care provider how to quit. If you do not use tobacco, please do not start.  . If you choose to drink alcohol, please do not consume more than 2 drinks per day. One drink is considered to be 12 ounces (355 mL) of beer, 5 ounces (148 mL) of wine, or 1.5  ounces (44 mL) of liquor.  . If you are 12-10 years old, ask your health care provider if you should take aspirin to prevent strokes.  . Use sunscreen. Apply sunscreen liberally and repeatedly throughout the day. You should seek shade when your shadow is shorter than you. Protect yourself by wearing long sleeves, pants, a wide-brimmed hat, and sunglasses year round, whenever you are outdoors.  . Once a month, do a whole body skin exam, using a mirror to look at the skin on your back. Tell your health care provider of new moles, moles that have irregular borders, moles that are larger than a pencil eraser, or moles that have changed in shape or color.

## 2015-12-28 NOTE — Progress Notes (Signed)
Pre visit review using our clinic review tool, if applicable. No additional management support is needed unless otherwise documented below in the visit note.  Back pain.  Much better after intervention.  No more sharp pain in the back, occ tylenol use.  He still walks with some kyphosis.  D/w pt.   Not using a walker.    Hypothyroidism.  No neck mass.  No ADE on med.  TSH wnl.  Compliant.    Diabetes:  Using medications without difficulties:yes Hypoglycemic episodes:rare, not in the last few months.   Hyperglycemic episodes:no Feet problems:no Blood Sugars averaging: usually ~100-130, stable recently.   eye exam within last year: f/u pending, tomorrow.    Hypertension:    Using medication without problems or lightheadedness: yes Chest pain with exertion:no Edema:no Short of breath:no  Elevated Cholesterol: Using medications without problems:yes Muscle aches: no Diet compliance:yes Exercise: as tolerated  PMH and SH reviewed.   Vital signs, Meds and allergies reviewed.  ROS: See HPI.  Otherwise nontributory.   GEN: nad, alert and oriented HEENT: mucous membranes moist NECK: supple w/o LA CV: rrr. PULM: ctab, no inc wob ABD: soft, +bs EXT: no edema SKIN: no acute rash  Diabetic foot exam: Normal inspection No skin breakdown No calluses  Normal DP pulses Normal sensation to light tough and monofilament Nails normal

## 2015-12-29 DIAGNOSIS — H2513 Age-related nuclear cataract, bilateral: Secondary | ICD-10-CM | POA: Diagnosis not present

## 2015-12-30 LAB — HM DIABETES EYE EXAM

## 2016-01-11 DIAGNOSIS — E119 Type 2 diabetes mellitus without complications: Secondary | ICD-10-CM | POA: Diagnosis not present

## 2016-01-12 ENCOUNTER — Encounter: Payer: Self-pay | Admitting: Family Medicine

## 2016-02-17 DIAGNOSIS — E119 Type 2 diabetes mellitus without complications: Secondary | ICD-10-CM | POA: Diagnosis not present

## 2016-03-12 DIAGNOSIS — H2513 Age-related nuclear cataract, bilateral: Secondary | ICD-10-CM | POA: Diagnosis not present

## 2016-03-15 NOTE — Discharge Instructions (Signed)

## 2016-03-18 NOTE — Anesthesia Preprocedure Evaluation (Addendum)
Anesthesia Evaluation  Patient identified by MRN, date of birth, ID band  Reviewed: NPO status   History of Anesthesia Complications Negative for: history of anesthetic complications  Airway Mallampati: II  TM Distance: >3 FB Neck ROM: Full    Dental no notable dental hx. (+) Edentulous Upper, Edentulous Lower, Upper Dentures, Lower Dentures   Pulmonary neg pulmonary ROS,    Pulmonary exam normal breath sounds clear to auscultation       Cardiovascular Exercise Tolerance: Good hypertension, Normal cardiovascular exam+ dysrhythmias (history of SVT in 2015)      Neuro/Psych negative neurological ROS  negative psych ROS   GI/Hepatic negative GI ROS, Neg liver ROS,   Endo/Other  diabetes, Oral Hypoglycemic AgentsHypothyroidism   Renal/GU negative Renal ROS  negative genitourinary   Musculoskeletal  (+) Arthritis , Osteoarthritis,    Abdominal   Peds  Hematology negative hematology ROS (+)   Anesthesia Other Findings   Reproductive/Obstetrics                        Anesthesia Physical Anesthesia Plan  ASA: II  Anesthesia Plan: MAC   Post-op Pain Management:    Induction:   Airway Management Planned:   Additional Equipment:   Intra-op Plan:   Post-operative Plan:   Informed Consent: I have reviewed the patients History and Physical, chart, labs and discussed the procedure including the risks, benefits and alternatives for the proposed anesthesia with the patient or authorized representative who has indicated his/her understanding and acceptance.     Plan Discussed with: CRNA  Anesthesia Plan Comments:         Anesthesia Quick Evaluation

## 2016-03-19 ENCOUNTER — Encounter
Admission: RE | Disposition: A | Discharge status: Home / Self Care | Payer: Self-pay | Source: Ambulatory Visit | Attending: Ophthalmology

## 2016-03-19 ENCOUNTER — Ambulatory Visit: Payer: Medicare Other | Admitting: Anesthesiology

## 2016-03-19 ENCOUNTER — Ambulatory Visit
Admission: RE | Admit: 2016-03-19 | Discharge: 2016-03-19 | Disposition: A | Discharge status: Home / Self Care | Payer: Medicare Other | Source: Ambulatory Visit | Attending: Ophthalmology | Admitting: Ophthalmology

## 2016-03-19 DIAGNOSIS — I10 Essential (primary) hypertension: Secondary | ICD-10-CM | POA: Diagnosis not present

## 2016-03-19 DIAGNOSIS — Z79899 Other long term (current) drug therapy: Secondary | ICD-10-CM | POA: Insufficient documentation

## 2016-03-19 DIAGNOSIS — Z791 Long term (current) use of non-steroidal anti-inflammatories (NSAID): Secondary | ICD-10-CM | POA: Insufficient documentation

## 2016-03-19 DIAGNOSIS — M1990 Unspecified osteoarthritis, unspecified site: Secondary | ICD-10-CM | POA: Diagnosis not present

## 2016-03-19 DIAGNOSIS — H9193 Unspecified hearing loss, bilateral: Secondary | ICD-10-CM | POA: Diagnosis not present

## 2016-03-19 DIAGNOSIS — E7800 Pure hypercholesterolemia, unspecified: Secondary | ICD-10-CM | POA: Insufficient documentation

## 2016-03-19 DIAGNOSIS — Z7984 Long term (current) use of oral hypoglycemic drugs: Secondary | ICD-10-CM | POA: Insufficient documentation

## 2016-03-19 DIAGNOSIS — E119 Type 2 diabetes mellitus without complications: Secondary | ICD-10-CM | POA: Insufficient documentation

## 2016-03-19 DIAGNOSIS — H2511 Age-related nuclear cataract, right eye: Secondary | ICD-10-CM | POA: Insufficient documentation

## 2016-03-19 DIAGNOSIS — H2513 Age-related nuclear cataract, bilateral: Secondary | ICD-10-CM | POA: Diagnosis not present

## 2016-03-19 HISTORY — DX: Presence of dental prosthetic device (complete) (partial): Z97.2

## 2016-03-19 HISTORY — PX: CATARACT EXTRACTION W/PHACO: SHX586

## 2016-03-19 HISTORY — DX: Unspecified hearing loss, unspecified ear: H91.90

## 2016-03-19 LAB — GLUCOSE, CAPILLARY
GLUCOSE-CAPILLARY: 134 mg/dL — AB (ref 65–99)
GLUCOSE-CAPILLARY: 145 mg/dL — AB (ref 65–99)

## 2016-03-19 SURGERY — PHACOEMULSIFICATION, CATARACT, WITH IOL INSERTION
Anesthesia: Monitor Anesthesia Care | Laterality: Right | Wound class: Clean

## 2016-03-19 MED ORDER — MIDAZOLAM HCL 2 MG/2ML IJ SOLN
INTRAMUSCULAR | Status: DC | PRN
  Administered 2016-03-19: 2 mg via INTRAVENOUS

## 2016-03-19 MED ORDER — ARMC OPHTHALMIC DILATING GEL
1.0000 "application " | OPHTHALMIC | Status: DC | PRN
  Administered 2016-03-19 (×2): 1 via OPHTHALMIC

## 2016-03-19 MED ORDER — TETRACAINE HCL 0.5 % OP SOLN
1.0000 [drp] | OPHTHALMIC | Status: DC | PRN
  Administered 2016-03-19: 1 [drp] via OPHTHALMIC

## 2016-03-19 MED ORDER — FENTANYL CITRATE (PF) 100 MCG/2ML IJ SOLN
INTRAMUSCULAR | Status: DC | PRN
  Administered 2016-03-19: 50 ug via INTRAVENOUS

## 2016-03-19 MED ORDER — POVIDONE-IODINE 5 % OP SOLN
1.0000 "application " | OPHTHALMIC | Status: DC | PRN
  Administered 2016-03-19: 1 via OPHTHALMIC

## 2016-03-19 MED ORDER — LACTATED RINGERS IV SOLN: INTRAVENOUS | Status: DC

## 2016-03-19 MED ORDER — BACITRACIN 500 UNIT/GM OP OINT: TOPICAL_OINTMENT | OPHTHALMIC | Status: DC | PRN

## 2016-03-19 MED ORDER — CEFUROXIME OPHTHALMIC INJECTION 1 MG/0.1 ML
INJECTION | OPHTHALMIC | Status: DC | PRN
  Administered 2016-03-19: 0.1 mL via INTRACAMERAL

## 2016-03-19 MED ORDER — BRIMONIDINE TARTRATE 0.2 % OP SOLN
OPHTHALMIC | Status: DC | PRN
  Administered 2016-03-19: 1 [drp] via OPHTHALMIC

## 2016-03-19 MED ORDER — OXYCODONE HCL 5 MG PO TABS: 5.0000 mg | ORAL_TABLET | Freq: Once | ORAL | Status: DC | PRN

## 2016-03-19 MED ORDER — OXYCODONE HCL 5 MG/5ML PO SOLN: 5.0000 mg | Freq: Once | ORAL | Status: DC | PRN

## 2016-03-19 MED ORDER — LIDOCAINE HCL (PF) 4 % IJ SOLN
INTRAOCULAR | Status: DC | PRN
  Administered 2016-03-19: 1 mL via OPHTHALMIC

## 2016-03-19 MED ORDER — TIMOLOL MALEATE 0.5 % OP SOLN
OPHTHALMIC | Status: DC | PRN
  Administered 2016-03-19: 1 [drp] via OPHTHALMIC

## 2016-03-19 MED ORDER — EPINEPHRINE HCL 1 MG/ML IJ SOLN
INTRAOCULAR | Status: DC | PRN
  Administered 2016-03-19: 85 mL via OPHTHALMIC

## 2016-03-19 MED ORDER — NA HYALUR & NA CHOND-NA HYALUR 0.4-0.35 ML IO KIT
PACK | INTRAOCULAR | Status: DC | PRN
  Administered 2016-03-19: 1 mL via INTRAOCULAR

## 2016-03-19 SURGICAL SUPPLY — 31 items
APL FBRTP 3 NS LF CTTN WD (MISCELLANEOUS) ×1
APPLICATOR COTTON TIP 3IN (MISCELLANEOUS) ×2 IMPLANT
CANNULA ANT/CHMB 27G (MISCELLANEOUS) ×1 IMPLANT
CANNULA ANT/CHMB 27GA (MISCELLANEOUS) ×2 IMPLANT
DISSECTOR HYDRO NUCLEUS 50X22 (MISCELLANEOUS) ×2 IMPLANT
GLOVE BIO SURGEON STRL SZ7 (GLOVE) ×2 IMPLANT
GLOVE SURG LX 6.5 MICRO (GLOVE) ×1
GLOVE SURG LX STRL 6.5 MICRO (GLOVE) ×1 IMPLANT
GOWN STRL REUS W/ TWL LRG LVL3 (GOWN DISPOSABLE) ×2 IMPLANT
GOWN STRL REUS W/TWL LRG LVL3 (GOWN DISPOSABLE) ×4
LENS IOL ACRYSOF IQ 21.5 (Intraocular Lens) ×1 IMPLANT
MARKER SKIN DUAL TIP RULER LAB (MISCELLANEOUS) ×2 IMPLANT
NDL FILTER BLUNT 18X1 1/2 (NEEDLE) ×1 IMPLANT
NEEDLE FILTER BLUNT 18X 1/2SAF (NEEDLE) ×1
NEEDLE FILTER BLUNT 18X1 1/2 (NEEDLE) ×1 IMPLANT
PACK CATARACT BRASINGTON (MISCELLANEOUS) ×2 IMPLANT
PACK EYE AFTER SURG (MISCELLANEOUS) ×2 IMPLANT
PACK OPTHALMIC (MISCELLANEOUS) ×2 IMPLANT
RING MALYGIN 7.0 (MISCELLANEOUS) IMPLANT
SOL BAL SALT 15ML (MISCELLANEOUS)
SOLUTION BAL SALT 15ML (MISCELLANEOUS) IMPLANT
SUT ETHILON 10-0 CS-B-6CS-B-6 (SUTURE)
SUT VICRYL  9 0 (SUTURE)
SUT VICRYL 9 0 (SUTURE) IMPLANT
SUTURE EHLN 10-0 CS-B-6CS-B-6 (SUTURE) IMPLANT
SYR 3ML LL SCALE MARK (SYRINGE) ×2 IMPLANT
SYR TB 1ML LUER SLIP (SYRINGE) ×2 IMPLANT
WATER STERILE IRR 250ML POUR (IV SOLUTION) ×2 IMPLANT
WATER STERILE IRR 500ML POUR (IV SOLUTION) IMPLANT
WICK EYE OCUCEL (MISCELLANEOUS) IMPLANT
WIPE NON LINTING 3.25X3.25 (MISCELLANEOUS) ×2 IMPLANT

## 2016-03-19 NOTE — Anesthesia Postprocedure Evaluation (Signed)
Anesthesia Post Note  Patient: Aaron Becker  Procedure(s) Performed: Procedure(s) (LRB): CATARACT EXTRACTION PHACO AND INTRAOCULAR LENS PLACEMENT (IOC) (Right)  Patient location during evaluation: PACU Anesthesia Type: MAC Level of consciousness: awake and alert Pain management: pain level controlled Vital Signs Assessment: post-procedure vital signs reviewed and stable Respiratory status: spontaneous breathing, nonlabored ventilation, respiratory function stable and patient connected to nasal cannula oxygen Cardiovascular status: stable and blood pressure returned to baseline Anesthetic complications: no    Muriel Wilber

## 2016-03-19 NOTE — Transfer of Care (Signed)
Immediate Anesthesia Transfer of Care Note  Patient: Aaron Becker  Procedure(s) Performed: Procedure(s) with comments: CATARACT EXTRACTION PHACO AND INTRAOCULAR LENS PLACEMENT (IOC) (Right) - DIABETIC-oral med RIGHT  Patient Location: PACU  Anesthesia Type: MAC  Level of Consciousness: awake, alert  and patient cooperative  Airway and Oxygen Therapy: Patient Spontanous Breathing and Patient connected to supplemental oxygen  Post-op Assessment: Post-op Vital signs reviewed, Patient's Cardiovascular Status Stable, Respiratory Function Stable, Patent Airway and No signs of Nausea or vomiting  Post-op Vital Signs: Reviewed and stable  Complications: No apparent anesthesia complications

## 2016-03-19 NOTE — Op Note (Signed)
Date of Surgery: 03/19/2016  PREOPERATIVE DIAGNOSES: Visually significant nuclear sclerotic cataract, right eye.  POSTOPERATIVE DIAGNOSES: Same  PROCEDURES PERFORMED: Cataract extraction with intraocular lens implant, right eye.  SURGEON: Almon Hercules, M.D.  ANESTHESIA: MAC and topical  IMPLANTS: [id number] +21.5 D   Implant Name Type Inv. Item Serial No. Manufacturer Lot No. LRB No. Used  LENS IOL ACRYSOF IQ 21.5 - [id number] Intraocular Lens LENS IOL ACRYSOF IQ 21.5 [id number] ALCON   Right 1     COMPLICATIONS: None.  DESCRIPTION OF PROCEDURE: Therapeutic options were discussed with the patient preoperatively, including a discussion of risks and benefits of surgery. Informed consent was obtained. An IOL-Master and immersion biometry were used to take the lens measurements, and a dilated fundus exam was performed within 6 months of the surgical date.  The patient was premedicated and brought to the operating room and placed on the operating table in the supine position. After adequate anesthesia, the patient was prepped and draped in the usual sterile ophthalmic fashion. A wire lid speculum was inserted and the microscope was positioned. A Superblade was used to create a paracentesis site at the limbus and a small amount of dilute preservative free lidocaine was instilled into the anterior chamber, followed by dispersive viscoelastic. A clear corneal incision was created temporally using a 2.4 mm keratome blade. Capsulorrhexis was then performed. In situ phacoemulsification was performed.  Cortical material was removed with the irrigation-aspiration unit. Dispersive viscoelastic was instilled to open the capsular bag. A posterior chamber intraocular lens with the specifications above was inserted and positioned. Irrigation-aspiration was used to remove all viscoelastic. Cefuroxime 1cc was instilled into the anterior chamber, and the corneal incision was checked and found to be water tight.  The eyelid speculum was removed.  The operative eye was covered with protective goggles after instilling 1 drop of timolol and brimonidine. The patient tolerated the procedure well. There were no complications.

## 2016-03-19 NOTE — H&P (Signed)
H+P reviewed and is up to date, please see paper chart.  

## 2016-03-19 NOTE — Anesthesia Procedure Notes (Signed)
Procedure Name: MAC Performed by: Rich Paprocki Pre-anesthesia Checklist: Patient identified, Emergency Drugs available, Suction available, Timeout performed and Patient being monitored Patient Re-evaluated:Patient Re-evaluated prior to inductionOxygen Delivery Method: Nasal cannula Placement Confirmation: positive ETCO2     

## 2016-03-20 ENCOUNTER — Encounter: Payer: Self-pay | Admitting: Ophthalmology

## 2016-04-13 DIAGNOSIS — H2512 Age-related nuclear cataract, left eye: Secondary | ICD-10-CM | POA: Diagnosis not present

## 2016-04-16 ENCOUNTER — Encounter: Payer: Self-pay | Admitting: *Deleted

## 2016-04-19 DIAGNOSIS — E119 Type 2 diabetes mellitus without complications: Secondary | ICD-10-CM | POA: Diagnosis not present

## 2016-04-20 NOTE — Discharge Instructions (Signed)

## 2016-04-23 ENCOUNTER — Ambulatory Visit
Admission: RE | Admit: 2016-04-23 | Discharge: 2016-04-23 | Disposition: A | Discharge status: Home / Self Care | Payer: Medicare Other | Source: Ambulatory Visit | Attending: Ophthalmology | Admitting: Ophthalmology

## 2016-04-23 ENCOUNTER — Ambulatory Visit: Payer: Medicare Other | Admitting: Anesthesiology

## 2016-04-23 ENCOUNTER — Encounter
Admission: RE | Disposition: A | Discharge status: Home / Self Care | Payer: Self-pay | Source: Ambulatory Visit | Attending: Ophthalmology

## 2016-04-23 DIAGNOSIS — E119 Type 2 diabetes mellitus without complications: Secondary | ICD-10-CM | POA: Insufficient documentation

## 2016-04-23 DIAGNOSIS — E079 Disorder of thyroid, unspecified: Secondary | ICD-10-CM | POA: Diagnosis not present

## 2016-04-23 DIAGNOSIS — I10 Essential (primary) hypertension: Secondary | ICD-10-CM | POA: Diagnosis not present

## 2016-04-23 DIAGNOSIS — M1990 Unspecified osteoarthritis, unspecified site: Secondary | ICD-10-CM | POA: Insufficient documentation

## 2016-04-23 DIAGNOSIS — E7800 Pure hypercholesterolemia, unspecified: Secondary | ICD-10-CM | POA: Insufficient documentation

## 2016-04-23 DIAGNOSIS — H2512 Age-related nuclear cataract, left eye: Secondary | ICD-10-CM | POA: Insufficient documentation

## 2016-04-23 DIAGNOSIS — Z882 Allergy status to sulfonamides status: Secondary | ICD-10-CM | POA: Diagnosis not present

## 2016-04-23 HISTORY — PX: CATARACT EXTRACTION W/PHACO: SHX586

## 2016-04-23 LAB — GLUCOSE, CAPILLARY
GLUCOSE-CAPILLARY: 136 mg/dL — AB (ref 65–99)
GLUCOSE-CAPILLARY: 139 mg/dL — AB (ref 65–99)

## 2016-04-23 SURGERY — PHACOEMULSIFICATION, CATARACT, WITH IOL INSERTION
Anesthesia: Monitor Anesthesia Care | Laterality: Left | Wound class: Clean

## 2016-04-23 MED ORDER — NA HYALUR & NA CHOND-NA HYALUR 0.4-0.35 ML IO KIT
PACK | INTRAOCULAR | Status: DC | PRN
  Administered 2016-04-23: 1 mL via INTRAOCULAR

## 2016-04-23 MED ORDER — MIDAZOLAM HCL 2 MG/2ML IJ SOLN
INTRAMUSCULAR | Status: DC | PRN
  Administered 2016-04-23: 1 mg via INTRAVENOUS

## 2016-04-23 MED ORDER — BRIMONIDINE TARTRATE 0.2 % OP SOLN
OPHTHALMIC | Status: DC | PRN
  Administered 2016-04-23: 1 [drp] via OPHTHALMIC

## 2016-04-23 MED ORDER — FENTANYL CITRATE (PF) 100 MCG/2ML IJ SOLN
INTRAMUSCULAR | Status: DC | PRN
  Administered 2016-04-23: 50 ug via INTRAVENOUS

## 2016-04-23 MED ORDER — ARMC OPHTHALMIC DILATING GEL
1.0000 "application " | OPHTHALMIC | Status: DC | PRN
  Administered 2016-04-23 (×2): 1 via OPHTHALMIC

## 2016-04-23 MED ORDER — TETRACAINE HCL 0.5 % OP SOLN
1.0000 [drp] | OPHTHALMIC | Status: DC | PRN
  Administered 2016-04-23: 1 [drp] via OPHTHALMIC

## 2016-04-23 MED ORDER — TIMOLOL MALEATE 0.5 % OP SOLN
OPHTHALMIC | Status: DC | PRN
  Administered 2016-04-23: 1 [drp] via OPHTHALMIC

## 2016-04-23 MED ORDER — LIDOCAINE HCL (PF) 4 % IJ SOLN
INTRAMUSCULAR | Status: DC | PRN
  Administered 2016-04-23: 1 mL via OPHTHALMIC

## 2016-04-23 MED ORDER — POVIDONE-IODINE 5 % OP SOLN
1.0000 "application " | OPHTHALMIC | Status: DC | PRN
  Administered 2016-04-23: 1 via OPHTHALMIC

## 2016-04-23 MED ORDER — EPINEPHRINE HCL 1 MG/ML IJ SOLN
INTRAOCULAR | Status: DC | PRN
  Administered 2016-04-23: 80 mL via OPHTHALMIC

## 2016-04-23 MED ORDER — CEFUROXIME OPHTHALMIC INJECTION 1 MG/0.1 ML
INJECTION | OPHTHALMIC | Status: DC | PRN
  Administered 2016-04-23: 0.1 mL via INTRACAMERAL

## 2016-04-23 MED ORDER — LACTATED RINGERS IV SOLN: INTRAVENOUS | Status: DC

## 2016-04-23 SURGICAL SUPPLY — 31 items
APL FBRTP 3 NS LF CTTN WD (MISCELLANEOUS) ×1
APPLICATOR COTTON TIP 3IN (MISCELLANEOUS) ×2 IMPLANT
CANNULA ANT/CHMB 27G (MISCELLANEOUS) ×1 IMPLANT
CANNULA ANT/CHMB 27GA (MISCELLANEOUS) ×2 IMPLANT
DISSECTOR HYDRO NUCLEUS 50X22 (MISCELLANEOUS) ×2 IMPLANT
GLOVE BIO SURGEON STRL SZ7 (GLOVE) ×2 IMPLANT
GLOVE SURG LX 6.5 MICRO (GLOVE) ×1
GLOVE SURG LX STRL 6.5 MICRO (GLOVE) ×1 IMPLANT
GOWN STRL REUS W/ TWL LRG LVL3 (GOWN DISPOSABLE) ×2 IMPLANT
GOWN STRL REUS W/TWL LRG LVL3 (GOWN DISPOSABLE) ×4
LENS IOL ACRYSOF IQ 21.5 (Intraocular Lens) ×1 IMPLANT
MARKER SKIN DUAL TIP RULER LAB (MISCELLANEOUS) ×2 IMPLANT
NDL FILTER BLUNT 18X1 1/2 (NEEDLE) ×1 IMPLANT
NEEDLE FILTER BLUNT 18X 1/2SAF (NEEDLE) ×1
NEEDLE FILTER BLUNT 18X1 1/2 (NEEDLE) ×1 IMPLANT
PACK CATARACT BRASINGTON (MISCELLANEOUS) ×2 IMPLANT
PACK EYE AFTER SURG (MISCELLANEOUS) ×2 IMPLANT
PACK OPTHALMIC (MISCELLANEOUS) ×2 IMPLANT
RING MALYGIN 7.0 (MISCELLANEOUS) IMPLANT
SOL BAL SALT 15ML (MISCELLANEOUS)
SOLUTION BAL SALT 15ML (MISCELLANEOUS) IMPLANT
SUT ETHILON 10-0 CS-B-6CS-B-6 (SUTURE)
SUT VICRYL  9 0 (SUTURE)
SUT VICRYL 9 0 (SUTURE) IMPLANT
SUTURE EHLN 10-0 CS-B-6CS-B-6 (SUTURE) IMPLANT
SYR 3ML LL SCALE MARK (SYRINGE) ×2 IMPLANT
SYR TB 1ML LUER SLIP (SYRINGE) ×2 IMPLANT
WATER STERILE IRR 250ML POUR (IV SOLUTION) ×2 IMPLANT
WATER STERILE IRR 500ML POUR (IV SOLUTION) IMPLANT
WICK EYE OCUCEL (MISCELLANEOUS) IMPLANT
WIPE NON LINTING 3.25X3.25 (MISCELLANEOUS) ×2 IMPLANT

## 2016-04-23 NOTE — Anesthesia Postprocedure Evaluation (Signed)
Anesthesia Post Note  Patient: Aaron Becker  Procedure(s) Performed: Procedure(s) (LRB): CATARACT EXTRACTION PHACO AND INTRAOCULAR LENS PLACEMENT (IOC) (Left)  Patient location during evaluation: PACU Anesthesia Type: MAC Level of consciousness: awake and alert Pain management: pain level controlled Vital Signs Assessment: post-procedure vital signs reviewed and stable Respiratory status: spontaneous breathing, nonlabored ventilation, respiratory function stable and patient connected to nasal cannula oxygen Cardiovascular status: stable and blood pressure returned to baseline Anesthetic complications: no    Larnie Heart

## 2016-04-23 NOTE — Transfer of Care (Signed)
Immediate Anesthesia Transfer of Care Note  Patient: Aaron Becker  Procedure(s) Performed: Procedure(s) with comments: CATARACT EXTRACTION PHACO AND INTRAOCULAR LENS PLACEMENT (IOC) (Left) - LEFT DIABETIC - oral meds  Patient Location: PACU  Anesthesia Type: MAC  Level of Consciousness: awake, alert  and patient cooperative  Airway and Oxygen Therapy: Patient Spontanous Breathing and Patient connected to supplemental oxygen  Post-op Assessment: Post-op Vital signs reviewed, Patient's Cardiovascular Status Stable, Respiratory Function Stable, Patent Airway and No signs of Nausea or vomiting  Post-op Vital Signs: Reviewed and stable  Complications: No apparent anesthesia complications

## 2016-04-23 NOTE — Op Note (Signed)
Date of Surgery: 04/23/2016  PREOPERATIVE DIAGNOSES: Visually significant nuclear sclerotic cataract, left eye.  POSTOPERATIVE DIAGNOSES: Same  PROCEDURES PERFORMED: Cataract extraction with intraocular lens implant, left eye.  SURGEON: Almon Hercules, M.D.  ANESTHESIA: MAC and topical  IMPLANTS: [id number] +21.5 D  Implant Name Type Inv. Item Serial No. Manufacturer Lot No. LRB No. Used  LENS IOL ACRYSOF IQ 21.5 - [id number] Intraocular Lens LENS IOL ACRYSOF IQ 21.5 [id number] ALCON   Left 1    COMPLICATIONS: None.  DESCRIPTION OF PROCEDURE: Therapeutic options were discussed with the patient preoperatively, including a discussion of risks and benefits of surgery. Informed consent was obtained. An IOL-Master and immersion biometry were used to take the lens measurements, and a dilated fundus exam was performed within 6 months of the surgical date.  The patient was premedicated and brought to the operating room and placed on the operating table in the supine position. After adequate anesthesia, the patient was prepped and draped in the usual sterile ophthalmic fashion. A wire lid speculum was inserted and the microscope was positioned. A Superblade was used to create a paracentesis site at the limbus and a small amount of dilute preservative free lidocaine was instilled into the anterior chamber, followed by dispersive viscoelastic. A clear corneal incision was created temporally using a 2.4 mm keratome blade. Capsulorrhexis was then performed. In situ phacoemulsification was performed.  Cortical material was removed with the irrigation-aspiration unit. Dispersive viscoelastic was instilled to open the capsular bag. A posterior chamber intraocular lens with the specifications above was inserted and positioned. Irrigation-aspiration was used to remove all viscoelastic. Cefuroxime 1cc was instilled into the anterior chamber, and the corneal incision was checked and found to be water tight. The  eyelid speculum was removed.  The operative eye was covered with protective goggles after instilling 1 drop of timolol and brimonidine. The patient tolerated the procedure well. There were no complications.

## 2016-04-23 NOTE — H&P (Signed)
H+P reviewed and is up to date, please see paper chart.  

## 2016-04-23 NOTE — Anesthesia Procedure Notes (Signed)
Procedure Name: MAC Performed by: Niva Murren Pre-anesthesia Checklist: Patient identified, Emergency Drugs available, Suction available, Patient being monitored and Timeout performed Patient Re-evaluated:Patient Re-evaluated prior to inductionOxygen Delivery Method: Nasal cannula       

## 2016-04-23 NOTE — Anesthesia Preprocedure Evaluation (Signed)
Anesthesia Evaluation  Patient identified by MRN, date of birth, ID band  Reviewed: NPO status   History of Anesthesia Complications Negative for: history of anesthetic complications  Airway Mallampati: II  TM Distance: >3 FB Neck ROM: Full    Dental  (+) Edentulous Upper, Edentulous Lower, Upper Dentures, Lower Dentures   Pulmonary neg pulmonary ROS,    Pulmonary exam normal breath sounds clear to auscultation       Cardiovascular Exercise Tolerance: Good hypertension, Normal cardiovascular exam+ dysrhythmias (history of SVT in 2015) Supra Ventricular Tachycardia      Neuro/Psych Last Syncope 2 years ago. negative neurological ROS  negative psych ROS   GI/Hepatic negative GI ROS, Neg liver ROS,   Endo/Other  diabetesHypothyroidism   Renal/GU negative Renal ROS  negative genitourinary   Musculoskeletal  (+) Arthritis , Osteoarthritis,    Abdominal   Peds  Hematology negative hematology ROS (+)   Anesthesia Other Findings   Reproductive/Obstetrics                             Anesthesia Physical Anesthesia Plan  ASA: II  Anesthesia Plan: MAC   Post-op Pain Management:    Induction:   Airway Management Planned:   Additional Equipment:   Intra-op Plan:   Post-operative Plan:   Informed Consent: I have reviewed the patients History and Physical, chart, labs and discussed the procedure including the risks, benefits and alternatives for the proposed anesthesia with the patient or authorized representative who has indicated his/her understanding and acceptance.     Plan Discussed with: CRNA  Anesthesia Plan Comments:         Anesthesia Quick Evaluation

## 2016-05-21 ENCOUNTER — Telehealth: Payer: Self-pay

## 2016-05-21 DIAGNOSIS — E119 Type 2 diabetes mellitus without complications: Secondary | ICD-10-CM | POA: Diagnosis not present

## 2016-05-21 MED ORDER — ONETOUCH ULTRA 2 W/DEVICE KIT: PACK | 2 refills | Status: DC

## 2016-05-21 MED ORDER — ONETOUCH ULTRA 2 W/DEVICE KIT: PACK | 0 refills | Status: DC

## 2016-05-21 NOTE — Telephone Encounter (Signed)
Aaron Becker said pt is having problem with current meter getting erratic readings; meter is over 80 yrs old and request new meter to [facility] [address]. Refill done as requested and Aaron Becker voiced understanding.

## 2016-05-23 DIAGNOSIS — Z961 Presence of intraocular lens: Secondary | ICD-10-CM | POA: Diagnosis not present

## 2016-06-22 ENCOUNTER — Ambulatory Visit (INDEPENDENT_AMBULATORY_CARE_PROVIDER_SITE_OTHER): Payer: Medicare Other

## 2016-06-22 DIAGNOSIS — Z23 Encounter for immunization: Secondary | ICD-10-CM

## 2016-07-11 ENCOUNTER — Other Ambulatory Visit: Payer: Self-pay | Admitting: Family Medicine

## 2016-07-11 DIAGNOSIS — E119 Type 2 diabetes mellitus without complications: Secondary | ICD-10-CM | POA: Diagnosis not present

## 2016-08-28 ENCOUNTER — Other Ambulatory Visit: Payer: Self-pay | Admitting: Family Medicine

## 2016-08-28 DIAGNOSIS — E119 Type 2 diabetes mellitus without complications: Secondary | ICD-10-CM | POA: Diagnosis not present

## 2016-11-21 DIAGNOSIS — E119 Type 2 diabetes mellitus without complications: Secondary | ICD-10-CM | POA: Diagnosis not present

## 2016-11-21 LAB — HM DIABETES EYE EXAM

## 2016-12-04 ENCOUNTER — Encounter: Payer: Self-pay | Admitting: Family Medicine

## 2016-12-05 ENCOUNTER — Other Ambulatory Visit: Payer: Self-pay

## 2016-12-05 MED ORDER — BD SWAB SINGLE USE REGULAR PADS: MEDICATED_PAD | 99 refills | Status: DC

## 2016-12-05 MED ORDER — GLUCOSE BLOOD VI STRP: ORAL_STRIP | 3 refills | Status: DC

## 2016-12-06 MED ORDER — BD SWAB SINGLE USE REGULAR PADS: MEDICATED_PAD | 1 refills | Status: DC

## 2016-12-06 MED ORDER — GLUCOSE BLOOD VI STRP: ORAL_STRIP | 1 refills | Status: DC

## 2016-12-06 NOTE — Addendum Note (Signed)
Addended by: Helene Shoe on: 12/06/2016 02:47 PM   Modules accepted: Orders

## 2016-12-06 NOTE — Telephone Encounter (Signed)
VM left that needs to have test strips and alcohol to [facility] [address]. Refilled per protocol and left v/m for pt to ck with pharmacy.

## 2016-12-06 NOTE — Telephone Encounter (Signed)
Aaron Becker said went by [name] [address] and they did not have rxs. Spoke with [name] at [name] and they do have refills on strips and swabs and will get ready for pick up. Pt to call before go to pick up.Aaron Becker voiced understanding.

## 2016-12-10 DIAGNOSIS — E119 Type 2 diabetes mellitus without complications: Secondary | ICD-10-CM | POA: Diagnosis not present

## 2016-12-10 NOTE — Telephone Encounter (Signed)
Aaron Becker went to pick up diabetic supplies on 12/09/16 and was told need new rx with Dx code. I spoke with Kime pharmacist at [name] [address]. And has appropriate rx but needs to see pt medicare card; advised Aaron Becker and she will go back today to pick up if problems Aaron Becker will cb from pharmacy.

## 2017-01-14 ENCOUNTER — Telehealth: Payer: Self-pay | Admitting: *Deleted

## 2017-01-14 NOTE — Telephone Encounter (Signed)
Patient's friend Aaron Becker left a voicemail requesting that a written script for his test strips be mailed to him at his home for #100 count. Aaron Becker stated that he is having so many problems with [facility] getting the test strips like he use to from [place]. Aaron Becker stated that the last time that he got the test strips they gave him #50 count and he sometimes has to check his blood sugar more than once a day. Aaron Becker stated that he likes to get #100 at a time and they plan on taking the script to [facility] to get their help because the people at [facility] are not very nice.

## 2017-01-15 MED ORDER — GLUCOSE BLOOD VI STRP: ORAL_STRIP | 3 refills | Status: DC

## 2017-01-15 NOTE — Telephone Encounter (Signed)
Printed. Please mailed to patient. Thanks.

## 2017-01-15 NOTE — Telephone Encounter (Signed)
Rx mailed as requested.

## 2017-01-30 ENCOUNTER — Ambulatory Visit (INDEPENDENT_AMBULATORY_CARE_PROVIDER_SITE_OTHER): Payer: Medicare Other | Admitting: Family Medicine

## 2017-01-30 ENCOUNTER — Telehealth: Payer: Self-pay | Admitting: Family Medicine

## 2017-01-30 ENCOUNTER — Encounter: Payer: Self-pay | Admitting: Family Medicine

## 2017-01-30 VITALS — BP 128/70 | HR 71 | Temp 98.1°F | Wt 158.0 lb

## 2017-01-30 DIAGNOSIS — Z23 Encounter for immunization: Secondary | ICD-10-CM

## 2017-01-30 DIAGNOSIS — S51819A Laceration without foreign body of unspecified forearm, initial encounter: Secondary | ICD-10-CM | POA: Diagnosis not present

## 2017-01-30 DIAGNOSIS — S51019A Laceration without foreign body of unspecified elbow, initial encounter: Secondary | ICD-10-CM | POA: Insufficient documentation

## 2017-01-30 NOTE — Addendum Note (Signed)
Addended by: Pilar Grammes on: 01/30/2017 12:28 PM   Modules accepted: Orders

## 2017-01-30 NOTE — Assessment & Plan Note (Signed)
New- no signs of infection. Tetanus booster given today. Keep area clean and wrapped. Call or return to clinic prn if these symptoms worsen or fail to improve as anticipated. The patient indicates understanding of these issues and agrees with the plan.

## 2017-01-30 NOTE — Telephone Encounter (Signed)
Pt has appt with Dr Deborra Medina on 01/30/17 at 11:45am.

## 2017-01-30 NOTE — Patient Instructions (Signed)
Skin Tear Care A skin tear is a wound in which the top layer of skin peels off. To repair the skin, your doctor may use:  Tape.  Skin adhesive strips. A bandage (dressing) may also be placed over the tape or skin adhesive strips. How to care for your skin tear  Clean the wound as told by your doctor. You may be told to keep the wound dry for the first few days. If you are told to clean the wound:  Wash the wound with mild soap and water or a salt-water (saline) solution.  Rinse the wound with water to remove all soap.  Do not rub the wound dry. Let the wound air dry.  Change any dressings as told by your doctor. This includes changing the dressing if it gets wet, gets dirty, or starts to smell bad.  Do not scratch or pick at the wound.  Protect the injured area until it has healed.  Check your wound every day for signs of infection. Check for:  More redness, swelling, or pain.  More fluid or blood.  Warmth.  Pus or a bad smell. Medicines    Take over-the-counter and prescription medicines only as told by your doctor.  If you were prescribed an antibiotic medicine, take or apply it as told by your doctor. Do not stop using the antibiotic even if your condition gets better. General instructions   Keep the bandage dry as told by your doctor.  Do not take baths, swim, or do anything that puts your wound underwater until your doctor says it is okay.  Keep all follow-up visits as told by your doctor. This is important. Contact a doctor if:  You have more redness, swelling, or pain around your wound.  You have more fluid or blood coming from your wound.  Your wound feels warm to the touch.  You have pus or a bad smell coming from your wound. Get help right away if:  You have a red streak that goes away from the skin tear.  You have a fever and chills and your symptoms suddenly get worse. This information is not intended to replace advice given to you by your health  care provider. Make sure you discuss any questions you have with your health care provider. Document Released: 06/19/2008 Document Revised: 05/06/2016 Document Reviewed: 08/01/2015 Elsevier Interactive Patient Education  2017 Elsevier Inc.  

## 2017-01-30 NOTE — Progress Notes (Signed)
Pre visit review using our clinic review tool, if applicable. No additional management support is needed unless otherwise documented below in the visit note. 

## 2017-01-30 NOTE — Progress Notes (Signed)
Subjective:   Patient ID: Aaron Becker, male    DOB: Apr 03, 1930, 83 y.o.   MRN: [id number]  Aaron Becker is a pleasant 83 y.o. year old male patient of Dr. Damita Dunnings, new to me, who presents to clinic today with Skin Tear (Skin tear on left arm after scraping it on tractor. Needs a Tetanus booster.)  on 01/30/2017  HPI:  Skin tear on left arm arm- happened yesterday on the tractor.  Girlfriend applied a pressure bandage.  Due for tetanus booster.  Denies fever or drainage.  Has not been bleeding through the bandage.   Current Outpatient Prescriptions on File Prior to Visit  Medication Sig Dispense Refill  . Alcohol Swabs (B-D SINGLE USE SWABS REGULAR) PADS TO TEST BLOOD SUGAR ONCE DAILY AND AS DIRECTED DX E11.9 100 each 1  . benazepril (LOTENSIN) 5 MG tablet Take 1 tablet (5 mg total) by mouth daily. (Patient taking differently: Take 5 mg by mouth daily. DINNER) 90 tablet 3  . diphenhydrAMINE (BENADRYL) 25 mg capsule Take 25 mg by mouth as needed. PM    . Docusate Calcium (STOOL SOFTENER PO) Take by mouth as needed. PM    . glimepiride (AMARYL) 1 MG tablet Take 2 tablets (2 mg total) by mouth daily with breakfast. 180 tablet 3  . glucose blood (ONE TOUCH ULTRA TEST) test strip TO TEST BLOOD SUGAR ONCE DAILY AND AS DIRECTED DX E11.9 100 each 3  . guaiFENesin (MUCINEX) 600 MG 12 hr tablet Take 600 mg by mouth 2 (two) times daily as needed for cough or to loosen phlegm.    . hydrochlorothiazide (HYDRODIURIL) 12.5 MG tablet TAKE 0.5-1 TABLETS (6.25-12.5 MG TOTAL) BY MOUTH DAILY AS NEEDED (FOR SWELLING). (Patient taking differently: 12.5 mg. TAKE 0.5-1 TABLETS (6.25-12.5 MG TOTAL) BY MOUTH DAILY AS NEEDED (FOR SWELLING)./AM) 90 tablet 3  . levothyroxine (SYNTHROID, LEVOTHROID) 75 MCG tablet Take 1 tablet (75 mcg total) by mouth daily. 90 tablet 3  . metFORMIN (GLUCOPHAGE) 500 MG tablet TAKE 1 TABLET BY MOUTH EVERY MORNING, 2 TABLETS AT SUPPER , THEN 1 EXTRA TAB AT NIGHT IF NEEDED 360 tablet 3   . Multiple Vitamins-Minerals (CENTRUM SILVER PO) Take by mouth daily. AM    . naproxen sodium (ANAPROX) 220 MG tablet Take 220 mg by mouth as needed.    . Omega-3 Fatty Acids (FISH OIL) 1000 MG CAPS Take by mouth daily. Reported on 03/13/2016    . Oxymetazoline HCl (NASAL SPRAY NA) Place into the nose. PM    . pravastatin (PRAVACHOL) 80 MG tablet TAKE 1 TABLET (80 MG TOTAL) BY MOUTH DAILY. (Patient taking differently: 80 mg. TAKE 1 TABLET (80 MG TOTAL) BY MOUTH DAILY.Wonda Cheng) 90 tablet 3  . pyridOXINE (VITAMIN B-6) 100 MG tablet Take 100 mg by mouth daily. AM    . vitamin C (ASCORBIC ACID) 500 MG tablet Take 500 mg by mouth daily. AM    . Blood Glucose Monitoring Suppl (ONE TOUCH ULTRA 2) w/Device KIT Check blood sugar once daily and as directed. Dx E11.9 1 each 0   No current facility-administered medications on file prior to visit.     Allergies  Allergen Reactions  . Flomax [Tamsulosin Hcl] Other (See Comments)    syncope  . Ibuprofen Other (See Comments)    Oral blisters at high doses  . Rosuvastatin     REACTION: muscle stiffness  . Sulfonamide Derivatives Rash    Past Medical History:  Diagnosis Date  . Arthritis  hands  . Bradycardia   . Colon polyp   . Diabetes mellitus, type II ([place]) 1986   oral meds  . Elevated PSA 2011   per Dr. Jacqlyn Larsen with [facility]  . [facility] (hard of hearing)    bilateral AIDES  . Hyperlipidemia   . Hypertension 02/02   controlled on meds  . Hypothyroidism 1980's  . Palpitations   . Right rib fracture   . Sleep apnea   . SVT (supraventricular tachycardia) ([place])    a. reported SVT during admission at [facility] 06/2014  . Syncope and collapse   . Wears dentures    upper and lower    Past Surgical History:  Procedure Laterality Date  . [place], 09/2015   COMPRESSED FRACTURE SPINE  . carotid ultrasound  02/02   [facility]  . CATARACT EXTRACTION W/PHACO Right 03/19/2016   Procedure: CATARACT EXTRACTION PHACO AND INTRAOCULAR LENS PLACEMENT (IOC);   Surgeon: Ronnell Freshwater, MD;  Location: [place];  Service: Ophthalmology;  Laterality: Right;  DIABETIC-oral med RIGHT  . CATARACT EXTRACTION W/PHACO Left 04/23/2016   Procedure: CATARACT EXTRACTION PHACO AND INTRAOCULAR LENS PLACEMENT (IOC);  Surgeon: Ronnell Freshwater, MD;  Location: [place];  Service: Ophthalmology;  Laterality: Left;  LEFT DIABETIC - oral meds  . CHOLECYSTECTOMY  1967  . COLONOSCOPY W/ POLYPECTOMY  2012   Villous adenoma from the rectum without high-grade dysplasia, 10 mm  . COLONOSCOPY WITH PROPOFOL N/A 03/30/2015   Procedure: COLONOSCOPY WITH PROPOFOL;  Surgeon: Robert Bellow, MD;  Location: [facility] ENDOSCOPY;  Service: Endoscopy;  Laterality: N/A;  . CYST EXCISION     thumb  . CYSTECTOMY  06/14/04   mucous cyst excision with left thumb, IP joint debridement  . HERNIA REPAIR  2013  . KYPHOPLASTY N/A 10/06/2015   Procedure: Thoracic twelve kyphoplasty;  Surgeon: Karie Chimera, MD;  Location: [place] NEURO [facility]S;  Service: Neurosurgery;  Laterality: N/A;  T12 Kyphoplasty  . PROSTATE BIOPSY  11/01/08   Dr. Reece Agar  . [place]  . VASECTOMY  1975    Family History  Problem Relation Age of Onset  . Cancer Father     liver  . Cancer Sister     breast  . Cancer Brother     skin  . Cancer Sister     breast  . Heart disease Sister     pacer  . Colon cancer Neg Hx   . Prostate cancer Neg Hx     Social History   Social History  . Marital status: Widowed    Spouse name: N/A  . Number of children: N/A  . Years of education: N/A   Occupational History  . [employer] banker Retired    Retired 1999   Social History Main Topics  . Smoking status: Never Smoker  . Smokeless tobacco: Never Used  . Alcohol use No  . Drug use: No  . Sexual activity: No   Other Topics Concern  . Not on file   Social History Narrative   Married- widower 2000 ([place]), not marred but with partner since 2001.     Retired from [employer].     [employer] '[employer], stationed in [place]   [place], [place], Social History, Family History, Medications, and allergies have been reviewed in [facility], and have been updated if relevant.   Review of Systems  Skin: Positive for wound.  All other systems reviewed and are negative.  Objective:    BP 128/70 (BP Location: Right Arm, Patient Position: Sitting, Cuff Size: Normal)   Pulse 71   Temp 98.1 F (36.7 C) (Oral)   Wt 158 lb (71.7 kg)   SpO2 98%   BMI 30.86 kg/m    Physical Exam  Constitutional: He is oriented to person, place, and time. He appears well-developed and well-nourished. No distress.  HENT:  Head: Normocephalic and atraumatic.  Eyes: Conjunctivae are normal.  Cardiovascular: Normal rate.   Pulmonary/Chest: Effort normal.  Musculoskeletal: Normal range of motion. He exhibits no edema.  Neurological: He is alert and oriented to person, place, and time.  Skin: He is not diaphoretic.     Psychiatric: He has a normal mood and affect. His behavior is normal. Judgment and thought content normal.  Nursing note and vitals reviewed.         Assessment & Plan:   No diagnosis found. No Follow-up on file.

## 2017-01-30 NOTE — Telephone Encounter (Signed)
PLEASE NOTE: All timestamps contained within this report are represented as [place] Standard Time. CONFIDENTIALTY NOTICE: This fax transmission is intended only for the addressee. It contains information that is legally privileged, confidential or otherwise protected from use or disclosure. If you are not the intended recipient, you are strictly prohibited from reviewing, disclosing, copying using or disseminating any of this information or taking any action in reliance on or regarding this information. If you have received this fax in error, please notify us immediately by telephone so that we can arrange for its return to [place]. Phone: [phone], Toll-Free: [phone], Fax: [phone] Page: 1 of 1 Call Id: [id number] [place] Patient Name: Aaron Becker DOB: 1930-03-01 Initial Comment Caller states pt sliced the top of his arm on an old tractor yesterday. She wants to know if he needs a tetanus vaccination. He is Diabetic. Nurse Assessment Nurse: Vallery Sa, RN, Cathy Date/Time (Eastern Time): 01/30/2017 8:34:48 AM Confirm and document reason for call. If symptomatic, describe symptoms. ---Caller states [name] has a cut on his left arm that is about 10 inches long. No active bleeding. They want to check on his Tetanus status. No fever. No fever. Alert and responsive. Does the patient have any new or worsening symptoms? ---Yes Will a triage be completed? ---Yes Related visit to physician within the last 2 weeks? ---No Does the PT have any chronic conditions? (i.e. diabetes, asthma, etc.) ---Yes List chronic conditions. ---Diabetes, High Cholesterol, Low Thyroid, high Blood Pressure Is this a behavioral health or substance abuse call? ---No Guidelines Guideline Title Affirmed Question Affirmed Notes Cuts and Lacerations [1] Last tetanus shot > 5 years ago AND [2] DIRTY cut Final Disposition  User See Physician within [place], RN, Tye Maryland Comments Scheduled for 11:45am appointment today with Dr. Deborra Medina. Upgraded to see Within 24 Hours. Referrals REFERRED TO PCP OFFICE Disagree/Comply: Comply

## 2017-02-11 ENCOUNTER — Ambulatory Visit (INDEPENDENT_AMBULATORY_CARE_PROVIDER_SITE_OTHER): Payer: Medicare Other | Admitting: Family Medicine

## 2017-02-11 ENCOUNTER — Encounter: Payer: Self-pay | Admitting: Family Medicine

## 2017-02-11 VITALS — BP 162/80 | HR 68 | Temp 98.5°F | Wt 149.5 lb

## 2017-02-11 DIAGNOSIS — I10 Essential (primary) hypertension: Secondary | ICD-10-CM | POA: Diagnosis not present

## 2017-02-11 DIAGNOSIS — E039 Hypothyroidism, unspecified: Secondary | ICD-10-CM | POA: Diagnosis not present

## 2017-02-11 DIAGNOSIS — E119 Type 2 diabetes mellitus without complications: Secondary | ICD-10-CM

## 2017-02-11 DIAGNOSIS — Z7189 Other specified counseling: Secondary | ICD-10-CM

## 2017-02-11 DIAGNOSIS — E7800 Pure hypercholesterolemia, unspecified: Secondary | ICD-10-CM

## 2017-02-11 LAB — COMPREHENSIVE METABOLIC PANEL
ALT: 30 U/L (ref 0–53)
AST: 30 U/L (ref 0–37)
Albumin: 4.3 g/dL (ref 3.5–5.2)
Alkaline Phosphatase: 52 U/L (ref 39–117)
BUN: 22 mg/dL (ref 6–23)
CHLORIDE: 104 meq/L (ref 96–112)
CO2: 29 mEq/L (ref 19–32)
Calcium: 9.8 mg/dL (ref 8.4–10.5)
Creatinine, Ser: 1.19 mg/dL (ref 0.40–1.50)
GFR: 61.81 mL/min (ref >60.00)
GLUCOSE: 99 mg/dL (ref 70–99)
POTASSIUM: 4.3 meq/L (ref 3.5–5.1)
SODIUM: 140 meq/L (ref 135–145)
Total Bilirubin: 0.6 mg/dL (ref 0.2–1.2)
Total Protein: 7.2 g/dL (ref 6.0–8.3)

## 2017-02-11 LAB — LIPID PANEL
Cholesterol: 114 mg/dL (ref 0–200)
HDL: 48 mg/dL (ref >39.00)
LDL CALC: 35 mg/dL (ref 0–99)
NONHDL: 66.43
Total CHOL/HDL Ratio: 2
Triglycerides: 157 mg/dL — ABNORMAL HIGH (ref 0.0–149.0)
VLDL: 31.4 mg/dL (ref 0.0–40.0)

## 2017-02-11 LAB — TSH: TSH: 0.49 u[IU]/mL (ref 0.35–4.50)

## 2017-02-11 LAB — HEMOGLOBIN A1C: HEMOGLOBIN A1C: 7.3 % — AB (ref 4.6–6.5)

## 2017-02-11 MED ORDER — PRAVASTATIN SODIUM 80 MG PO TABS: 80.0000 mg | ORAL_TABLET | Freq: Every day | ORAL | 3 refills | Status: DC

## 2017-02-11 MED ORDER — BENAZEPRIL HCL 5 MG PO TABS: 5.0000 mg | ORAL_TABLET | Freq: Every day | ORAL | 3 refills | Status: DC

## 2017-02-11 MED ORDER — LEVOTHYROXINE SODIUM 75 MCG PO TABS: 75.0000 ug | ORAL_TABLET | Freq: Every day | ORAL | 3 refills | Status: DC

## 2017-02-11 MED ORDER — GLIMEPIRIDE 1 MG PO TABS: 2.0000 mg | ORAL_TABLET | Freq: Every day | ORAL | 3 refills | Status: DC

## 2017-02-11 MED ORDER — METFORMIN HCL 500 MG PO TABS: ORAL_TABLET | ORAL | 3 refills | Status: DC

## 2017-02-11 MED ORDER — HYDROCHLOROTHIAZIDE 12.5 MG PO TABS: 12.5000 mg | ORAL_TABLET | Freq: Every day | ORAL | 3 refills | Status: DC | PRN

## 2017-02-11 NOTE — Assessment & Plan Note (Signed)
Reasonably controlled on home checks, check labs today, no change in meds.  He agrees.

## 2017-02-11 NOTE — Assessment & Plan Note (Signed)
Advance directive d/w pt. Aaron Becker and his daughter [name] designated if patient were incapacitated.

## 2017-02-11 NOTE — Progress Notes (Signed)
Diabetes:  Using medications without difficulties:  yes Hypoglycemic episodes:rare, "hardly ever" resolved with a snack.  Hyperglycemic episodes:no Feet problems:no Blood Sugars averaging: ~120s usually, occ higher with diet indiscretions.   eye exam within last year: yes Due for labs.    Advance directive d/w pt. Aaron Becker and his daughter [name] designated if patient were incapacitated.    Hypertension:    Using medication without problems or lightheadedness: yes Chest pain with exertion:no Edema:no Short of breath:no Due for labs.    Hypothyroidism.  No ADE on med.  No neck mass.  Due for TSH.  Compliant.   Elevated Cholesterol: Using medications without problems:yes Muscle aches: no Diet compliance: yes Exercise: encouraged.   Weight is stable compared to historical values, allowing for some normal variation.    L forearm is healed with minimal scab left.    Tick bite on the L shoulder.  Noted about 3 weeks ago.  Still draining some but less now, clear liquid.  Prev itchy, not now.  Tick was removed. No fevers.    Memory d/w pt.  Some occ lapses, but he still manages his medicines.  No red flag events. He is clearly still functional.  A&O x3.    PMH and SH reviewed.   Vital signs, Meds and allergies reviewed.  ROS: Per HPI unless specifically indicated in ROS section   GEN: nad, alert and oriented HEENT: mucous membranes moist NECK: supple w/o LA CV: rrr.  no murmur PULM: ctab, no inc wob ABD: soft, +bs EXT: no edema SKIN: no acute rash, tick bite site on the L upper posterior back is healing well w/o redness or fluctuance.   L elbow skin tear is healed with minimal scab   Diabetic foot exam: Normal inspection No skin breakdown No calluses  Normal DP pulses Normal sensation to light tough and monofilament Nails normal

## 2017-02-11 NOTE — Assessment & Plan Note (Signed)
Controlled, check labs today, no change in meds.  He agrees.

## 2017-02-11 NOTE — Assessment & Plan Note (Signed)
No ADE on med. Check labs today, no change in meds.  He agrees.

## 2017-02-11 NOTE — Assessment & Plan Note (Signed)
No tmg, check labs today, no change in meds.  He agrees.

## 2017-02-11 NOTE — Patient Instructions (Signed)
Don't change your meds for now.  Go to the lab on the way out.  We'll contact you with your lab report. It would be good to recheck labs in about 6 months before a visit.  Update me as needed.  Take care.  Glad to see you.

## 2017-03-15 DIAGNOSIS — E119 Type 2 diabetes mellitus without complications: Secondary | ICD-10-CM | POA: Diagnosis not present

## 2017-05-01 DIAGNOSIS — E119 Type 2 diabetes mellitus without complications: Secondary | ICD-10-CM | POA: Diagnosis not present

## 2017-06-09 ENCOUNTER — Encounter: Payer: Self-pay | Admitting: *Deleted

## 2017-06-09 ENCOUNTER — Observation Stay
Admission: EM | Admit: 2017-06-09 | Discharge: 2017-06-11 | Disposition: A | Discharge status: Home / Self Care | Payer: Medicare Other | Attending: Internal Medicine | Admitting: Internal Medicine

## 2017-06-09 DIAGNOSIS — K621 Rectal polyp: Secondary | ICD-10-CM | POA: Diagnosis not present

## 2017-06-09 DIAGNOSIS — E039 Hypothyroidism, unspecified: Secondary | ICD-10-CM | POA: Diagnosis not present

## 2017-06-09 DIAGNOSIS — I10 Essential (primary) hypertension: Secondary | ICD-10-CM | POA: Insufficient documentation

## 2017-06-09 DIAGNOSIS — I951 Orthostatic hypotension: Secondary | ICD-10-CM | POA: Insufficient documentation

## 2017-06-09 DIAGNOSIS — I471 Supraventricular tachycardia: Secondary | ICD-10-CM | POA: Insufficient documentation

## 2017-06-09 DIAGNOSIS — K922 Gastrointestinal hemorrhage, unspecified: Secondary | ICD-10-CM

## 2017-06-09 DIAGNOSIS — C160 Malignant neoplasm of cardia: Secondary | ICD-10-CM | POA: Diagnosis not present

## 2017-06-09 DIAGNOSIS — K254 Chronic or unspecified gastric ulcer with hemorrhage: Secondary | ICD-10-CM | POA: Diagnosis not present

## 2017-06-09 DIAGNOSIS — Z8249 Family history of ischemic heart disease and other diseases of the circulatory system: Secondary | ICD-10-CM | POA: Diagnosis not present

## 2017-06-09 DIAGNOSIS — K259 Gastric ulcer, unspecified as acute or chronic, without hemorrhage or perforation: Secondary | ICD-10-CM | POA: Insufficient documentation

## 2017-06-09 DIAGNOSIS — Z86010 Personal history of colonic polyps: Secondary | ICD-10-CM | POA: Diagnosis not present

## 2017-06-09 DIAGNOSIS — Z800 Family history of malignant neoplasm of digestive organs: Secondary | ICD-10-CM | POA: Diagnosis not present

## 2017-06-09 DIAGNOSIS — Z7984 Long term (current) use of oral hypoglycemic drugs: Secondary | ICD-10-CM | POA: Diagnosis not present

## 2017-06-09 DIAGNOSIS — Z79899 Other long term (current) drug therapy: Secondary | ICD-10-CM | POA: Insufficient documentation

## 2017-06-09 DIAGNOSIS — E785 Hyperlipidemia, unspecified: Secondary | ICD-10-CM | POA: Diagnosis not present

## 2017-06-09 DIAGNOSIS — R001 Bradycardia, unspecified: Secondary | ICD-10-CM | POA: Diagnosis not present

## 2017-06-09 DIAGNOSIS — G4730 Sleep apnea, unspecified: Secondary | ICD-10-CM | POA: Diagnosis not present

## 2017-06-09 DIAGNOSIS — K921 Melena: Secondary | ICD-10-CM

## 2017-06-09 DIAGNOSIS — Z803 Family history of malignant neoplasm of breast: Secondary | ICD-10-CM | POA: Diagnosis not present

## 2017-06-09 DIAGNOSIS — K648 Other hemorrhoids: Secondary | ICD-10-CM | POA: Diagnosis not present

## 2017-06-09 DIAGNOSIS — E119 Type 2 diabetes mellitus without complications: Secondary | ICD-10-CM | POA: Diagnosis not present

## 2017-06-09 DIAGNOSIS — Z882 Allergy status to sulfonamides status: Secondary | ICD-10-CM | POA: Diagnosis not present

## 2017-06-09 DIAGNOSIS — H9190 Unspecified hearing loss, unspecified ear: Secondary | ICD-10-CM | POA: Diagnosis not present

## 2017-06-09 DIAGNOSIS — R42 Dizziness and giddiness: Secondary | ICD-10-CM | POA: Diagnosis not present

## 2017-06-09 DIAGNOSIS — Z808 Family history of malignant neoplasm of other organs or systems: Secondary | ICD-10-CM | POA: Diagnosis not present

## 2017-06-09 LAB — URINALYSIS, COMPLETE (UACMP) WITH MICROSCOPIC
BACTERIA UA: NONE SEEN
BILIRUBIN URINE: NEGATIVE
Glucose, UA: NEGATIVE mg/dL
HGB URINE DIPSTICK: NEGATIVE
Ketones, ur: NEGATIVE mg/dL
LEUKOCYTES UA: NEGATIVE
NITRITE: NEGATIVE
PROTEIN: 30 mg/dL — AB
Specific Gravity, Urine: 1.017 (ref 1.005–1.030)
Squamous Epithelial / LPF: NONE SEEN
pH: 5 (ref 5.0–8.0)

## 2017-06-09 LAB — TYPE AND SCREEN
ABO/RH(D): A POS
Antibody Screen: NEGATIVE

## 2017-06-09 LAB — CBC
HEMATOCRIT: 32.9 % — AB (ref 40.0–52.0)
HEMOGLOBIN: 11.4 g/dL — AB (ref 13.0–18.0)
MCH: 31.2 pg (ref 26.0–34.0)
MCHC: 34.6 g/dL (ref 32.0–36.0)
MCV: 89.9 fL (ref 80.0–100.0)
Platelets: 216 10*3/uL (ref 150–440)
RBC: 3.66 MIL/uL — ABNORMAL LOW (ref 4.40–5.90)
RDW: 13.9 % (ref 11.5–14.5)
WBC: 9.7 10*3/uL (ref 3.8–10.6)

## 2017-06-09 LAB — COMPREHENSIVE METABOLIC PANEL
ALBUMIN: 3.6 g/dL (ref 3.5–5.0)
ALT: 24 U/L (ref 17–63)
AST: 27 U/L (ref 15–41)
Alkaline Phosphatase: 56 U/L (ref 38–126)
Anion gap: 8 (ref 5–15)
BILIRUBIN TOTAL: 0.7 mg/dL (ref 0.3–1.2)
BUN: 54 mg/dL — AB (ref 6–20)
CO2: 25 mmol/L (ref 22–32)
CREATININE: 1.16 mg/dL (ref 0.61–1.24)
Calcium: 9.2 mg/dL (ref 8.9–10.3)
Chloride: 106 mmol/L (ref 101–111)
GFR calc Af Amer: >60 mL/min (ref >60)
GFR calc non Af Amer: 56 mL/min — ABNORMAL LOW (ref >60)
GLUCOSE: 228 mg/dL — AB (ref 65–99)
Potassium: 4.5 mmol/L (ref 3.5–5.1)
Sodium: 139 mmol/L (ref 135–145)
TOTAL PROTEIN: 6.7 g/dL (ref 6.5–8.1)

## 2017-06-09 LAB — LIPASE, BLOOD: Lipase: 36 U/L (ref 11–51)

## 2017-06-09 LAB — GLUCOSE, CAPILLARY: Glucose-Capillary: 123 mg/dL — ABNORMAL HIGH (ref 65–99)

## 2017-06-09 MED ORDER — VITAMIN C 500 MG PO TABS
500.0000 mg | ORAL_TABLET | Freq: Every day | ORAL | Status: DC
  Administered 2017-06-10 – 2017-06-11 (×2): 500 mg via ORAL
  Filled 2017-06-09 (×2): qty 1

## 2017-06-09 MED ORDER — PRAVASTATIN SODIUM 20 MG PO TABS
80.0000 mg | ORAL_TABLET | Freq: Every day | ORAL | Status: DC
  Administered 2017-06-10 – 2017-06-11 (×2): 80 mg via ORAL
  Filled 2017-06-09 (×2): qty 4

## 2017-06-09 MED ORDER — LEVOTHYROXINE SODIUM 50 MCG PO TABS
75.0000 ug | ORAL_TABLET | Freq: Every day | ORAL | Status: DC
  Administered 2017-06-10 – 2017-06-11 (×2): 75 ug via ORAL
  Filled 2017-06-09 (×2): qty 2

## 2017-06-09 MED ORDER — OMEGA-3-ACID ETHYL ESTERS 1 G PO CAPS
1.0000 g | ORAL_CAPSULE | Freq: Every day | ORAL | Status: DC
  Administered 2017-06-10 – 2017-06-11 (×2): 1 g via ORAL
  Filled 2017-06-09 (×2): qty 1

## 2017-06-09 MED ORDER — DOCUSATE SODIUM 100 MG PO CAPS: 100.0000 mg | ORAL_CAPSULE | Freq: Two times a day (BID) | ORAL | Status: DC | PRN

## 2017-06-09 MED ORDER — SODIUM CHLORIDE 0.9 % IV SOLN
8.0000 mg/h | INTRAVENOUS | Status: DC
  Administered 2017-06-09 – 2017-06-11 (×4): 8 mg/h via INTRAVENOUS
  Filled 2017-06-09 (×5): qty 80

## 2017-06-09 MED ORDER — SODIUM CHLORIDE 0.9 % IV BOLUS (SEPSIS)
500.0000 mL | Freq: Once | INTRAVENOUS | Status: AC
  Administered 2017-06-09: 500 mL via INTRAVENOUS

## 2017-06-09 MED ORDER — PANTOPRAZOLE SODIUM 40 MG IV SOLR: 40.0000 mg | Freq: Two times a day (BID) | INTRAVENOUS | Status: DC

## 2017-06-09 MED ORDER — SODIUM CHLORIDE 0.9 % IV SOLN
80.0000 mg | Freq: Once | INTRAVENOUS | Status: AC
  Administered 2017-06-09: 80 mg via INTRAVENOUS
  Filled 2017-06-09: qty 80

## 2017-06-09 MED ORDER — DIPHENHYDRAMINE HCL 25 MG PO CAPS: 25.0000 mg | ORAL_CAPSULE | Freq: Every evening | ORAL | Status: DC | PRN

## 2017-06-09 MED ORDER — INSULIN ASPART 100 UNIT/ML ~~LOC~~ SOLN
0.0000 [IU] | Freq: Three times a day (TID) | SUBCUTANEOUS | Status: DC
  Administered 2017-06-10: 2 [IU] via SUBCUTANEOUS
  Administered 2017-06-10: 1 [IU] via SUBCUTANEOUS
  Administered 2017-06-10: 2 [IU] via SUBCUTANEOUS
  Filled 2017-06-09 (×4): qty 1

## 2017-06-09 NOTE — ED Notes (Signed)
Pt reports a few episodes of black stools that turned to bright red stools. C/o minimal abdominal pain on the left side.  Pt has hx of diverticulosis.

## 2017-06-09 NOTE — ED Notes (Signed)
Tech ask pt for urine and pt stated he couldn't go yet. Pt has urinal at bedside.

## 2017-06-09 NOTE — ED Triage Notes (Signed)
Pt states hx of diverticulitis, states abd discomfort with some dark stool this AM, denies any blood thinner use

## 2017-06-09 NOTE — ED Provider Notes (Signed)
[facility] Emergency Department Provider Note  ____________________________________________  Time seen: Approximately 5:08 PM  I have reviewed the triage vital signs and the nursing notes.   HISTORY  Chief Complaint Abdominal Pain   HPI Aaron Becker is a 83 y.o. male with a history of diverticulosis, diabetes, hypertension, hyperlipidemia who presents for evaluation of rectal bleeding. Patient reports that he woke up this morning and when he stood up he felt dizzy like he was going to pass out. He went to the bathroom and had a large bowel movement that looked black. A few hours later he had a second episode of bloody stools but this time it looked red. Patient reports prior similar episodes in the setting of diverticular bleed. He does take Aleve occasionally and took one dose yesterday. No other NSAIDs. Patient denies any history of peptic ulcer disease. He denies nausea or vomiting, chest pain or shortness of breath. He does endorse lightheadedness especially when he stands up.He denies abdominal pain.  Past Medical History:  Diagnosis Date  . Arthritis    hands  . Bradycardia   . Colon polyp   . Diabetes mellitus, type II ([place]) 1986   oral meds  . Elevated PSA 2011   per Dr. Jacqlyn Larsen with Uro  . [facility] (hard of hearing)    bilateral AIDES  . Hyperlipidemia   . Hypertension 02/02   controlled on meds  . Hypothyroidism 1980's  . Palpitations   . Right rib fracture   . Sleep apnea   . SVT (supraventricular tachycardia) ([place])    a. reported SVT during admission at [facility] 06/2014  . Syncope and collapse   . Wears dentures    upper and lower    Patient Active Problem List   Diagnosis Date Noted  . Advance care planning 02/11/2017  . T12 vertebral fracture ([place]) 08/22/2015  . Lower back pain 08/17/2015  . Villous adenoma polyp of rectum 04/06/2015  . History of colonic polyps 02/04/2015  . Bradycardia   . Palpitations   . SVT (supraventricular  tachycardia) ([place]) 07/18/2014  . Syncope 07/14/2014  . Medicare annual wellness visit, subsequent 12/04/2012  . Sleep concern 06/08/2012  . Memory change 12/06/2011  . PSA elevation 11/21/2011  . Right inguinal hernia 06/06/2011  . Diverticulitis of colon 02/22/2011  . SHOULDER STRAIN, RIGHT 11/01/2010  . ARTHRITIS, HANDS, BILATERAL 09/01/2007  . Hypothyroidism 01/07/2007  . Diabetes mellitus without complication ([place]) 28/36/6294  . HYPERCHOLESTEROLEMIA/ 156/LDL 101  7/03 01/07/2007  . Essential hypertension 01/07/2007  . ARTHRITIS, ELBOWS AND HIPS 01/07/2007    Past Surgical History:  Procedure Laterality Date  . [place], 09/2015   COMPRESSED FRACTURE SPINE  . carotid ultrasound  02/02   wnl  . CATARACT EXTRACTION W/PHACO Right 03/19/2016   Procedure: CATARACT EXTRACTION PHACO AND INTRAOCULAR LENS PLACEMENT (IOC);  Surgeon: Ronnell Freshwater, MD;  Location: [place];  Service: Ophthalmology;  Laterality: Right;  DIABETIC-oral med RIGHT  . CATARACT EXTRACTION W/PHACO Left 04/23/2016   Procedure: CATARACT EXTRACTION PHACO AND INTRAOCULAR LENS PLACEMENT (IOC);  Surgeon: Ronnell Freshwater, MD;  Location: [place];  Service: Ophthalmology;  Laterality: Left;  LEFT DIABETIC - oral meds  . CHOLECYSTECTOMY  1967  . COLONOSCOPY W/ POLYPECTOMY  2012   Villous adenoma from the rectum without high-grade dysplasia, 10 mm  . COLONOSCOPY WITH PROPOFOL N/A 03/30/2015   Procedure: COLONOSCOPY WITH PROPOFOL;  Surgeon: Robert Bellow, MD;  Location: [facility] ENDOSCOPY;  Service: Endoscopy;  Laterality: N/A;  . CYST EXCISION     thumb  . CYSTECTOMY  06/14/04   mucous cyst excision with left thumb, IP joint debridement  . HERNIA REPAIR  2013  . KYPHOPLASTY N/A 10/06/2015   Procedure: Thoracic twelve kyphoplasty;  Surgeon: Karie Chimera, MD;  Location: [place] NEURO ORS;  Service: Neurosurgery;  Laterality: N/A;  T12 Kyphoplasty  . PROSTATE BIOPSY  11/01/08    Dr. Reece Agar  . [place]  . VASECTOMY  1975    Prior to Admission medications   Medication Sig Start Date End Date Taking? Authorizing Provider  benazepril (LOTENSIN) 5 MG tablet Take 1 tablet (5 mg total) by mouth daily. 02/11/17   Tonia Ghent, MD  diphenhydrAMINE (BENADRYL) 25 mg capsule Take 25 mg by mouth as needed. PM    [provider]  Docusate Calcium (STOOL SOFTENER PO) Take by mouth as needed. PM    [provider]  glimepiride (AMARYL) 1 MG tablet Take 2 tablets (2 mg total) by mouth daily with breakfast. 02/11/17   Tonia Ghent, MD  glucose blood (ONE TOUCH ULTRA TEST) test strip TO TEST BLOOD SUGAR ONCE DAILY AND AS DIRECTED DX E11.9 01/15/17   Tonia Ghent, MD  guaiFENesin (MUCINEX) 600 MG 12 hr tablet Take 600 mg by mouth 2 (two) times daily as needed for cough or to loosen phlegm.    [provider]  hydrochlorothiazide (HYDRODIURIL) 12.5 MG tablet Take 1 tablet (12.5 mg total) by mouth daily as needed (for swelling). 02/11/17   Tonia Ghent, MD  levothyroxine (SYNTHROID, LEVOTHROID) 75 MCG tablet Take 1 tablet (75 mcg total) by mouth daily. 02/11/17   Tonia Ghent, MD  metFORMIN (GLUCOPHAGE) 500 MG tablet TAKE 1 TABLET BY MOUTH EVERY MORNING, 2 TABLETS AT SUPPER , THEN 1 EXTRA TAB AT NIGHT IF NEEDED 02/11/17   Tonia Ghent, MD  Multiple Vitamins-Minerals (CENTRUM SILVER PO) Take by mouth daily. AM    [provider]  naproxen sodium (ANAPROX) 220 MG tablet Take 220 mg by mouth as needed.    [provider]  Omega-3 Fatty Acids (FISH OIL) 1000 MG CAPS Take by mouth daily. Reported on 03/13/2016    [provider]  pravastatin (PRAVACHOL) 80 MG tablet Take 1 tablet (80 mg total) by mouth daily. 02/11/17   Tonia Ghent, MD  pyridOXINE (VITAMIN B-6) 100 MG tablet Take 100 mg by mouth daily. AM    [provider]  vitamin C (ASCORBIC ACID) 500 MG tablet Take 500 mg by mouth daily.  AM    [provider]    Allergies Flomax [tamsulosin hcl]; Ibuprofen; Rosuvastatin; and Sulfonamide derivatives  Family History  Problem Relation Age of Onset  . Cancer Father        liver  . Cancer Sister        breast  . Cancer Brother        skin  . Cancer Sister        breast  . Heart disease Sister        pacer  . Colon cancer Neg Hx   . Prostate cancer Neg Hx     Social History Social History  Substance Use Topics  . Smoking status: Never Smoker  . Smokeless tobacco: Never Used  . Alcohol use No    Review of Systems  Constitutional: Negative for fever. + Lightheadedness Eyes: Negative for visual changes. ENT: Negative for sore  throat. Neck: No neck pain  Cardiovascular: Negative for chest pain. Respiratory: Negative for shortness of breath. Gastrointestinal: Negative for abdominal pain, vomiting. + melena and BRBPR. Genitourinary: Negative for dysuria. Musculoskeletal: Negative for back pain. Skin: Negative for rash. Neurological: Negative for headaches, weakness or numbness. Psych: No SI or HI  ____________________________________________   PHYSICAL EXAM:  VITAL SIGNS: ED Triage Vitals  Enc Vitals Group     BP 06/09/17 1602 (!) 164/69     Pulse Rate 06/09/17 1602 94     Resp 06/09/17 1602 18     Temp 06/09/17 1602 98.2 F (36.8 C)     Temp Source 06/09/17 1602 Oral     SpO2 06/09/17 1602 99 %     Weight 06/09/17 1601 146 lb (66.2 kg)     Height 06/09/17 1601 5\' 2"  (1.575 m)     Head Circumference --      Peak Flow --      Pain Score 06/09/17 1600 0     Pain Loc --      Pain Edu? --      Excl. in [place]? --     Constitutional: Alert and oriented. Well appearing and in no apparent distress. HEENT:      Head: Normocephalic and atraumatic.         Eyes: Conjunctivae are normal. Sclera is non-icteric.       Mouth/Throat: Mucous membranes are moist.       Neck: Supple with no signs of meningismus. Cardiovascular: Regular rate and  rhythm. No murmurs, gallops, or rubs. 2+ symmetrical distal pulses are present in all extremities. No JVD. Respiratory: Normal respiratory effort. Lungs are clear to auscultation bilaterally. No wheezes, crackles, or rhonchi.  Gastrointestinal: Soft, non tender, and non distended with positive bowel sounds. No rebound or guarding.Rectal exam showing black stool grossly guaiac positive  Musculoskeletal: Nontender with normal range of motion in all extremities. No edema, cyanosis, or erythema of extremities. Neurologic: Normal speech and language. Face is symmetric. Moving all extremities. No gross focal neurologic deficits are appreciated. Skin: Skin is warm, dry and intact. No rash noted. Psychiatric: Mood and affect are normal. Speech and behavior are normal.  ____________________________________________   LABS (all labs ordered are listed, but only abnormal results are displayed)  Labs Reviewed  COMPREHENSIVE METABOLIC PANEL - Abnormal; Notable for the following:       Result Value   Glucose, Bld 228 (*)    BUN 54 (*)    GFR calc non Af Amer 56 (*)    All other components within normal limits  CBC - Abnormal; Notable for the following:    RBC 3.66 (*)    Hemoglobin 11.4 (*)    HCT 32.9 (*)    All other components within normal limits  LIPASE, BLOOD  URINALYSIS, COMPLETE (UACMP) WITH MICROSCOPIC  TYPE AND SCREEN   ____________________________________________  EKG  none  ____________________________________________  RADIOLOGY  none  ____________________________________________   PROCEDURES  Procedure(s) performed: None Procedures Critical Care performed:  None ____________________________________________   INITIAL IMPRESSION / ASSESSMENT AND PLAN / ED COURSE  83 y.o. male with a history of diverticulosis, diabetes, hypertension, hyperlipidemia who presents for evaluation of rectal bleeding and lightheadedness. Rectal exam showing melena grossly guaiac positive.  Patient is hemodynamically stable however slightly orthostatic with BP 143/73 and HR 98 laying and 130/70 and 101 standing. Will start patient on protonix bolus and drip, give IVF for orthostasis. Labs showing hgb 11.4. Discussed with Dr. Jamal Collin, on  call for patient's surgeon Dr. Bary Castilla who recommended admission to Hospitalist and Dr. Bary Castilla will consult in the am for endoscopy.      Pertinent labs & imaging results that were available during my care of the patient were reviewed by me and considered in my medical decision making (see chart for details).    ____________________________________________   FINAL CLINICAL IMPRESSION(S) / ED DIAGNOSES  Final diagnoses:  UGIB (upper gastrointestinal bleed)      NEW MEDICATIONS STARTED DURING THIS VISIT:  New Prescriptions   No medications on file     Note:  This document was prepared using Dragon voice recognition software and may include unintentional dictation errors.    Rudene Re, MD 06/09/17 [phone]

## 2017-06-09 NOTE — ED Notes (Signed)
Pt transport to 216 

## 2017-06-09 NOTE — H&P (Signed)
[facility] [place] NAME: Aaron Becker    MR#:  [id number]  DATE OF BIRTH:  04-27-1930  DATE OF ADMISSION:  06/09/2017  PRIMARY CARE PHYSICIAN: Tonia Ghent, MD   REQUESTING/REFERRING PHYSICIAN: Alfred Levins  CHIEF COMPLAINT:   Chief Complaint  Patient presents with  . Abdominal Pain    HISTORY OF PRESENT ILLNESS: Aaron Becker  is a 83 y.o. male with a known history of Arthritis, DM, Htn, HLD, Hypothyroidism, SVT, Sleep apnea- had blood in stool ( Dark colour) twice todayand felt dizzi. In ER noted guiac positive, and orthostatic. Hb stable. Took one aleve yesterday, but not regularly. No abd pain, nausea, vomit, fever.  PAST MEDICAL HISTORY:   Past Medical History:  Diagnosis Date  . Arthritis    hands  . Bradycardia   . Colon polyp   . Diabetes mellitus, type II ([place]) 1986   oral meds  . Elevated PSA 2011   per Dr. Jacqlyn Larsen with Uro  . [facility] (hard of hearing)    bilateral AIDES  . Hyperlipidemia   . Hypertension 02/02   controlled on meds  . Hypothyroidism 1980's  . Palpitations   . Right rib fracture   . Sleep apnea   . SVT (supraventricular tachycardia) ([place])    a. reported SVT during admission at [facility] 06/2014  . Syncope and collapse   . Wears dentures    upper and lower    PAST SURGICAL HISTORY: Past Surgical History:  Procedure Laterality Date  . [place], 09/2015   COMPRESSED FRACTURE SPINE  . carotid ultrasound  02/02   wnl  . CATARACT EXTRACTION W/PHACO Right 03/19/2016   Procedure: CATARACT EXTRACTION PHACO AND INTRAOCULAR LENS PLACEMENT (IOC);  Surgeon: Ronnell Freshwater, MD;  Location: [place];  Service: Ophthalmology;  Laterality: Right;  DIABETIC-oral med RIGHT  . CATARACT EXTRACTION W/PHACO Left 04/23/2016   Procedure: CATARACT EXTRACTION PHACO AND INTRAOCULAR LENS PLACEMENT (IOC);  Surgeon: Ronnell Freshwater, MD;  Location: [place];  Service: Ophthalmology;   Laterality: Left;  LEFT DIABETIC - oral meds  . CHOLECYSTECTOMY  1967  . COLONOSCOPY W/ POLYPECTOMY  2012   Villous adenoma from the rectum without high-grade dysplasia, 10 mm  . COLONOSCOPY WITH PROPOFOL N/A 03/30/2015   Procedure: COLONOSCOPY WITH PROPOFOL;  Surgeon: Robert Bellow, MD;  Location: [facility] ENDOSCOPY;  Service: Endoscopy;  Laterality: N/A;  . CYST EXCISION     thumb  . CYSTECTOMY  06/14/04   mucous cyst excision with left thumb, IP joint debridement  . HERNIA REPAIR  2013  . KYPHOPLASTY N/A 10/06/2015   Procedure: Thoracic twelve kyphoplasty;  Surgeon: Karie Chimera, MD;  Location: [place] NEURO ORS;  Service: Neurosurgery;  Laterality: N/A;  T12 Kyphoplasty  . PROSTATE BIOPSY  11/01/08   Dr. Reece Agar  . [place]  . VASECTOMY  1975    SOCIAL HISTORY:  Social History  Substance Use Topics  . Smoking status: Never Smoker  . Smokeless tobacco: Never Used  . Alcohol use No    FAMILY HISTORY:  Family History  Problem Relation Age of Onset  . Cancer Father        liver  . Cancer Sister        breast  . Cancer Brother        skin  . Cancer Sister        breast  . Heart disease Sister  pacer  . Colon cancer Neg Hx   . Prostate cancer Neg Hx     DRUG ALLERGIES:  Allergies  Allergen Reactions  . Flomax [Tamsulosin Hcl] Other (See Comments)    syncope  . Ibuprofen Other (See Comments)    Oral blisters at high doses  . Rosuvastatin     REACTION: muscle stiffness  . Sulfonamide Derivatives Rash    REVIEW OF SYSTEMS:   CONSTITUTIONAL: No fever, fatigue or weakness.  EYES: No blurred or double vision.  EARS, NOSE, AND THROAT: No tinnitus or ear pain.  RESPIRATORY: No cough, shortness of breath, wheezing or hemoptysis.  CARDIOVASCULAR: No chest pain, orthopnea, edema.  GASTROINTESTINAL: No nausea, vomiting, diarrhea or abdominal pain.  GENITOURINARY: No dysuria, hematuria.  ENDOCRINE: No polyuria, nocturia,  HEMATOLOGY: No anemia,  easy bruising or bleeding SKIN: No rash or lesion. MUSCULOSKELETAL: No joint pain or arthritis.   NEUROLOGIC: No tingling, numbness, weakness.  PSYCHIATRY: No anxiety or depression.   MEDICATIONS AT HOME:  Prior to Admission medications   Medication Sig Start Date End Date Taking? Authorizing Provider  benazepril (LOTENSIN) 5 MG tablet Take 1 tablet (5 mg total) by mouth daily. 02/11/17   Tonia Ghent, MD  diphenhydrAMINE (BENADRYL) 25 mg capsule Take 25 mg by mouth as needed. PM    [provider]  Docusate Calcium (STOOL SOFTENER PO) Take by mouth as needed. PM    [provider]  glimepiride (AMARYL) 1 MG tablet Take 2 tablets (2 mg total) by mouth daily with breakfast. 02/11/17   Tonia Ghent, MD  glucose blood (ONE TOUCH ULTRA TEST) test strip TO TEST BLOOD SUGAR ONCE DAILY AND AS DIRECTED DX E11.9 01/15/17   Tonia Ghent, MD  guaiFENesin (MUCINEX) 600 MG 12 hr tablet Take 600 mg by mouth 2 (two) times daily as needed for cough or to loosen phlegm.    [provider]  hydrochlorothiazide (HYDRODIURIL) 12.5 MG tablet Take 1 tablet (12.5 mg total) by mouth daily as needed (for swelling). 02/11/17   Tonia Ghent, MD  levothyroxine (SYNTHROID, LEVOTHROID) 75 MCG tablet Take 1 tablet (75 mcg total) by mouth daily. 02/11/17   Tonia Ghent, MD  metFORMIN (GLUCOPHAGE) 500 MG tablet TAKE 1 TABLET BY MOUTH EVERY MORNING, 2 TABLETS AT SUPPER , THEN 1 EXTRA TAB AT NIGHT IF NEEDED 02/11/17   Tonia Ghent, MD  Multiple Vitamins-Minerals (CENTRUM SILVER PO) Take by mouth daily. AM    [provider]  naproxen sodium (ANAPROX) 220 MG tablet Take 220 mg by mouth as needed.    [provider]  Omega-3 Fatty Acids (FISH OIL) 1000 MG CAPS Take by mouth daily. Reported on 03/13/2016    [provider]  pravastatin (PRAVACHOL) 80 MG tablet Take 1 tablet (80 mg total) by mouth daily. 02/11/17   Tonia Ghent, MD  pyridOXINE (VITAMIN B-6)  100 MG tablet Take 100 mg by mouth daily. AM    [provider]  vitamin C (ASCORBIC ACID) 500 MG tablet Take 500 mg by mouth daily. AM    [provider]      PHYSICAL EXAMINATION:   VITAL SIGNS: Blood pressure (!) 142/80, pulse 97, temperature 98.2 F (36.8 C), temperature source Oral, resp. rate 18, height 5\' 2"  (1.575 m), weight 66.2 kg (146 lb), SpO2 100 %.  GENERAL:  83 y.o.-year-old patient lying in the bed with no acute distress.  EYES: Pupils equal, round, reactive to light and accommodation. No  scleral icterus. Extraocular muscles intact.  HEENT: Head atraumatic, normocephalic. Oropharynx and nasopharynx clear.  NECK:  Supple, no jugular venous distention. No thyroid enlargement, no tenderness.  LUNGS: Normal breath sounds bilaterally, no wheezing, rales,rhonchi or crepitation. No use of accessory muscles of respiration.  CARDIOVASCULAR: S1, S2 normal. No murmurs, rubs, or gallops.  ABDOMEN: Soft, nontender, nondistended. Bowel sounds present. No organomegaly or mass.  EXTREMITIES: No pedal edema, cyanosis, or clubbing.  NEUROLOGIC: Cranial nerves II through XII are intact. Muscle strength 5/5 in all extremities. Sensation intact. Gait not checked.  PSYCHIATRIC: The patient is alert and oriented x 3.  SKIN: No obvious rash, lesion, or ulcer.   LABORATORY PANEL:   CBC  Recent Labs Lab 06/09/17 1558  WBC 9.7  HGB 11.4*  HCT 32.9*  PLT 216  MCV 89.9  MCH 31.2  MCHC 34.6  RDW 13.9   ------------------------------------------------------------------------------------------------------------------  Chemistries   Recent Labs Lab 06/09/17 1558  NA 139  K 4.5  CL 106  CO2 25  GLUCOSE 228*  BUN 54*  CREATININE 1.16  CALCIUM 9.2  AST 27  ALT 24  ALKPHOS 56  BILITOT 0.7   ------------------------------------------------------------------------------------------------------------------ estimated creatinine clearance is 39.7 mL/min (by C-G  formula based on SCr of 1.16 mg/dL). ------------------------------------------------------------------------------------------------------------------ No results for input(s): TSH, T4TOTAL, T3FREE, THYROIDAB in the last 72 hours.  Invalid input(s): FREET3   Coagulation profile No results for input(s): INR, PROTIME in the last 168 hours. ------------------------------------------------------------------------------------------------------------------- No results for input(s): DDIMER in the last 72 hours. -------------------------------------------------------------------------------------------------------------------  Cardiac Enzymes No results for input(s): CKMB, TROPONINI, MYOGLOBIN in the last 168 hours.  Invalid input(s): CK ------------------------------------------------------------------------------------------------------------------ Invalid input(s): POCBNP  ---------------------------------------------------------------------------------------------------------------  Urinalysis    Component Value Date/Time   COLORURINE Yellow 07/09/2014 1954   APPEARANCEUR Clear 07/09/2014 1954   LABSPEC 1.022 07/09/2014 1954   PHURINE 5.0 07/09/2014 1954   GLUCOSEU >=500 07/09/2014 1954   HGBUR 1+ 07/09/2014 [place] Negative 07/09/2014 1954   KETONESUR Trace 07/09/2014 1954   PROTEINUR 100 mg/dL 07/09/2014 1954   NITRITE Negative 07/09/2014 1954   LEUKOCYTESUR Negative 07/09/2014 1954     RADIOLOGY: No results found.  EKG: Orders placed or performed during the hospital encounter of 10/05/15  . EKG 12-Lead  . EKG 12-Lead  . EKG 12-Lead  . EKG 12-Lead    IMPRESSION AND PLAN:  * GI bleed   Likely diverticular bleed   Monitor Hb, no transfusion now.   Full liquid diet.   IF HE NEED colonoscopy- Call Dr. Bary Castilla ( his PMD)  * DM    Hold oral meds, as on liquid diet   Keep on ISS  * Orthostatic hypotension    Hold HTN meds, check every shift.  *  Hyperlipidemia    Cont home meds  All the records are reviewed and case discussed with ED provider. Management plans discussed with the patient, family and they are in agreement.  CODE STATUS: Full. Code Status History    This patient does not have a recorded code status. Please follow your organizational policy for patients in this situation.     Discussed with his wife and son in room.  TOTAL TIME TAKING CARE OF THIS PATIENT: 45 minutes.    Vaughan Basta M.D on 06/09/2017   Between 7am to 6pm - Pager - [phone]  After 6pm go to [email or web address] - password [email or web address] [place] [facility]  Office  [phone]  CC: Primary care physician; Tonia Ghent, MD  Note: This dictation was prepared with Dragon dictation along with smaller phrase technology. Any transcriptional errors that result from this process are unintentional.

## 2017-06-10 DIAGNOSIS — K922 Gastrointestinal hemorrhage, unspecified: Secondary | ICD-10-CM

## 2017-06-10 DIAGNOSIS — E785 Hyperlipidemia, unspecified: Secondary | ICD-10-CM | POA: Diagnosis not present

## 2017-06-10 DIAGNOSIS — I951 Orthostatic hypotension: Secondary | ICD-10-CM | POA: Diagnosis not present

## 2017-06-10 DIAGNOSIS — K921 Melena: Secondary | ICD-10-CM

## 2017-06-10 DIAGNOSIS — K621 Rectal polyp: Secondary | ICD-10-CM | POA: Diagnosis not present

## 2017-06-10 DIAGNOSIS — E119 Type 2 diabetes mellitus without complications: Secondary | ICD-10-CM | POA: Diagnosis not present

## 2017-06-10 LAB — CBC
HCT: 30.8 % — ABNORMAL LOW (ref 40.0–52.0)
Hemoglobin: 10.7 g/dL — ABNORMAL LOW (ref 13.0–18.0)
MCH: 31.8 pg (ref 26.0–34.0)
MCHC: 34.9 g/dL (ref 32.0–36.0)
MCV: 91.2 fL (ref 80.0–100.0)
PLATELETS: 172 10*3/uL (ref 150–440)
RBC: 3.37 MIL/uL — ABNORMAL LOW (ref 4.40–5.90)
RDW: 13.8 % (ref 11.5–14.5)
WBC: 6.3 10*3/uL (ref 3.8–10.6)

## 2017-06-10 LAB — BASIC METABOLIC PANEL
Anion gap: 5 (ref 5–15)
BUN: 41 mg/dL — AB (ref 6–20)
CALCIUM: 8.8 mg/dL — AB (ref 8.9–10.3)
CO2: 26 mmol/L (ref 22–32)
CREATININE: 1.05 mg/dL (ref 0.61–1.24)
Chloride: 109 mmol/L (ref 101–111)
GFR calc Af Amer: >60 mL/min (ref >60)
GLUCOSE: 134 mg/dL — AB (ref 65–99)
Potassium: 3.9 mmol/L (ref 3.5–5.1)
Sodium: 140 mmol/L (ref 135–145)

## 2017-06-10 LAB — HEMOGLOBIN AND HEMATOCRIT, BLOOD
HEMATOCRIT: 31.3 % — AB (ref 40.0–52.0)
HEMATOCRIT: 31.7 % — AB (ref 40.0–52.0)
HEMOGLOBIN: 10.7 g/dL — AB (ref 13.0–18.0)
Hemoglobin: 10.6 g/dL — ABNORMAL LOW (ref 13.0–18.0)

## 2017-06-10 LAB — GLUCOSE, CAPILLARY
GLUCOSE-CAPILLARY: 191 mg/dL — AB (ref 65–99)
GLUCOSE-CAPILLARY: 200 mg/dL — AB (ref 65–99)
Glucose-Capillary: 149 mg/dL — ABNORMAL HIGH (ref 65–99)
Glucose-Capillary: 192 mg/dL — ABNORMAL HIGH (ref 65–99)

## 2017-06-10 NOTE — Care Management Obs Status (Signed)
[place] NOTIFICATION   Patient Details  Name: LINDEL MARCELL MRN: [id number] Date of Birth: 1930/05/12   Medicare Observation Status Notification Given:  Yes    Beverly Sessions, RN 06/10/2017, 3:15 PM

## 2017-06-10 NOTE — Consult Note (Signed)
Aaron Lame, MD [facility]  [address] [place], [place] [address] Phone: [phone] Fax : [phone]  Consultation  Referring Provider:     Dr. Margaretmary Eddy Primary Care Physician:  Tonia Ghent, MD Primary Gastroenterologist:  unassigned         Reason for Consultation:     GI bleed  Date of Admission:  06/09/2017 Date of Consultation:  06/10/2017         HPI:   Aaron Becker is a 83 y.o. male who has a history of having a colonoscopy in 2016 by Dr. Bary Castilla. At that time the patient was found to have a large tubulovillous adenoma of the rectum. The lesion was not removed and the patient was now admitted with rectal bleeding of bright red blood per rectum and dark stools. The patient had a black stool this morning and one yesterday. The patient states he takes Advil as needed and took 1 a few days ago. He also takes an 81 mg of aspirin per day. The patient does not report any previous history of bleeding. The patient's baseline hemoglobin from 2015 was between 9.6 and 13. His hemoglobin and 2016 was 13.7. The patient was now admitted with a hemoglobin of 11.4 that went down to 10.7 this morning.There is no report of any abdominal pain unexplained weight loss fevers chills nausea or vomiting. The patient reports that he had his colonoscopy 2 years ago to be done prior to having a hernia repaired.  Past Medical History:  Diagnosis Date  . Arthritis    hands  . Bradycardia   . Colon polyp   . Diabetes mellitus, type II ([place]) 1986   oral meds  . Elevated PSA 2011   per Dr. Jacqlyn Larsen with Uro  . [facility] (hard of hearing)    bilateral AIDES  . Hyperlipidemia   . Hypertension 02/02   controlled on meds  . Hypothyroidism 1980's  . Palpitations   . Right rib fracture   . Sleep apnea   . SVT (supraventricular tachycardia) ([place])    a. reported SVT during admission at [facility] 06/2014  . Syncope and collapse   . Wears dentures    upper and lower    Past Surgical History:  Procedure  Laterality Date  . [place], 09/2015   COMPRESSED FRACTURE SPINE  . carotid ultrasound  02/02   wnl  . CATARACT EXTRACTION W/PHACO Right 03/19/2016   Procedure: CATARACT EXTRACTION PHACO AND INTRAOCULAR LENS PLACEMENT (IOC);  Surgeon: Ronnell Freshwater, MD;  Location: [place];  Service: Ophthalmology;  Laterality: Right;  DIABETIC-oral med RIGHT  . CATARACT EXTRACTION W/PHACO Left 04/23/2016   Procedure: CATARACT EXTRACTION PHACO AND INTRAOCULAR LENS PLACEMENT (IOC);  Surgeon: Ronnell Freshwater, MD;  Location: [place];  Service: Ophthalmology;  Laterality: Left;  LEFT DIABETIC - oral meds  . CHOLECYSTECTOMY  1967  . COLONOSCOPY W/ POLYPECTOMY  2012   Villous adenoma from the rectum without high-grade dysplasia, 10 mm  . COLONOSCOPY WITH PROPOFOL N/A 03/30/2015   Procedure: COLONOSCOPY WITH PROPOFOL;  Surgeon: Robert Bellow, MD;  Location: [facility] ENDOSCOPY;  Service: Endoscopy;  Laterality: N/A;  . CYST EXCISION     thumb  . CYSTECTOMY  06/14/04   mucous cyst excision with left thumb, IP joint debridement  . HERNIA REPAIR  2013  . KYPHOPLASTY N/A 10/06/2015   Procedure: Thoracic twelve kyphoplasty;  Surgeon: Karie Chimera, MD;  Location: [place] NEURO ORS;  Service:  Neurosurgery;  Laterality: N/A;  T12 Kyphoplasty  . PROSTATE BIOPSY  11/01/08   Dr. Reece Agar  . [place]  . VASECTOMY  1975    Prior to Admission medications   Medication Sig Start Date End Date Taking? Authorizing Provider  benazepril (LOTENSIN) 5 MG tablet Take 1 tablet (5 mg total) by mouth daily. 02/11/17  Yes Tonia Ghent, MD  diphenhydrAMINE (BENADRYL) 25 mg capsule Take 25 mg by mouth as needed. PM   Yes [provider]  Docusate Calcium (STOOL SOFTENER PO) Take 1 tablet by mouth as needed. PM    Yes [provider]  glimepiride (AMARYL) 1 MG tablet Take 2 tablets (2 mg total) by mouth daily with breakfast. 02/11/17  Yes Tonia Ghent, MD  hydrochlorothiazide (HYDRODIURIL) 12.5 MG tablet Take 1 tablet (12.5 mg total) by mouth daily as needed (for swelling). 02/11/17  Yes Tonia Ghent, MD  levothyroxine (SYNTHROID, LEVOTHROID) 75 MCG tablet Take 1 tablet (75 mcg total) by mouth daily. 02/11/17  Yes Tonia Ghent, MD  metFORMIN (GLUCOPHAGE) 500 MG tablet TAKE 1 TABLET BY MOUTH EVERY MORNING, 2 TABLETS AT SUPPER , THEN 1 EXTRA TAB AT NIGHT IF NEEDED 02/11/17  Yes Tonia Ghent, MD  Multiple Vitamins-Minerals (CENTRUM SILVER PO) Take by mouth daily. AM   Yes [provider]  naproxen sodium (ANAPROX) 220 MG tablet Take 220 mg by mouth as needed.   Yes [provider]  Omega-3 Fatty Acids (FISH OIL) 1000 MG CAPS Take by mouth daily. Reported on 03/13/2016   Yes [provider]  pravastatin (PRAVACHOL) 80 MG tablet Take 1 tablet (80 mg total) by mouth daily. 02/11/17  Yes Tonia Ghent, MD  pyridOXINE (VITAMIN B-6) 100 MG tablet Take 100 mg by mouth daily. AM   Yes [provider]  vitamin C (ASCORBIC ACID) 500 MG tablet Take 500 mg by mouth daily. AM   Yes [provider]  glucose blood (ONE TOUCH ULTRA TEST) test strip TO TEST BLOOD SUGAR ONCE DAILY AND AS DIRECTED DX E11.9 01/15/17   Tonia Ghent, MD    Family History  Problem Relation Age of Onset  . Cancer Father        liver  . Cancer Sister        breast  . Cancer Brother        skin  . Cancer Sister        breast  . Heart disease Sister        pacer  . Colon cancer Neg Hx   . Prostate cancer Neg Hx      Social History  Substance Use Topics  . Smoking status: Never Smoker  . Smokeless tobacco: Never Used  . Alcohol use No    Allergies as of 06/09/2017 - Review Complete 06/09/2017  Allergen Reaction Noted  . Flomax [tamsulosin hcl] Other (See Comments) 07/26/2014  . Ibuprofen Other (See Comments) 02/02/2015  . Rosuvastatin  01/23/2007  . Sulfonamide derivatives Rash 01/23/2007    Review of  Systems:    All systems reviewed and negative except where noted in HPI.   Physical Exam:  Vital signs in last 24 hours: Temp:  [97.9 F (36.6 C)-98.3 F (36.8 C)] 98 F (36.7 C) (09/17 1300) Pulse Rate:  [71-91] 76 (09/17 1300) Resp:  [10-20] 19 (09/17 1300) BP: (120-144)/(56-81) 142/78 (09/17 1300) SpO2:  [97 %-100 %] 99 % (09/17 1300) Last BM Date: 06/09/17 General:  Pleasant, cooperative in NAD Head:  Normocephalic and atraumatic. Eyes:   No icterus.   Conjunctiva pink. PERRLA. Ears:  Normal auditory acuity. Neck:  Supple; no masses or thyroidomegaly Lungs: Respirations even and unlabored. Lungs clear to auscultation bilaterally.   No wheezes, crackles, or rhonchi.  Heart:  Regular rate and rhythm;  Without murmur, clicks, rubs or gallops Abdomen:  Soft, nondistended, nontender. Normal bowel sounds. No appreciable masses or hepatomegaly.  No rebound or guarding.  Rectal:  Not performed. Msk:  Symmetrical without gross deformities.    Extremities:  Without edema, cyanosis or clubbing. Neurologic:  Alert and oriented x3;  grossly normal neurologically. Skin:  Intact without significant lesions or rashes. Cervical Nodes:  No significant cervical adenopathy. Psych:  Alert and cooperative. Normal affect.  LAB RESULTS:  Recent Labs  06/09/17 1558 06/10/17 0404  WBC 9.7 6.3  HGB 11.4* 10.7*  HCT 32.9* 30.8*  PLT 216 172   BMET  Recent Labs  06/09/17 1558 06/10/17 0404  NA 139 140  K 4.5 3.9  CL 106 109  CO2 25 26  GLUCOSE 228* 134*  BUN 54* 41*  CREATININE 1.16 1.05  CALCIUM 9.2 8.8*   LFT  Recent Labs  06/09/17 1558  PROT 6.7  ALBUMIN 3.6  AST 27  ALT 24  ALKPHOS 56  BILITOT 0.7   PT/INR No results for input(s): LABPROT, INR in the last 72 hours.  STUDIES: No results found.    Impression / Plan:   Aaron Becker is a 83 y.o. y/o male with black stools or bloody stools. The patient has a history of having a large tubulovillous adenoma that was  not removed 2 years ago. The patient also has been on 2 anti-inflammatory medications recently including a baby aspirin and Advil. This with his advanced age of risk factors for him to have peptic ulcer disease. The patient was on a clear liquid diet today and had a cracker short time ago.The patient has been told that due to this she cannot have any procedures done today unless it was an emergency. The patient will be set up for a upper endoscopy and flexible sigmoidoscopy for tomorrow.  I have discussed risks & benefits which include, but are not limited to, bleeding, infection, perforation & drug reaction.  The patient agrees with this plan & written consent will be obtained.     Thank you for involving me in the care of this patient.      LOS: 0 days   Aaron Lame, MD  06/10/2017, 5:01 PM   Note: This dictation was prepared with Dragon dictation along with smaller phrase technology. Any transcriptional errors that result from this process are unintentional.

## 2017-06-10 NOTE — Progress Notes (Signed)
[facility] [place] NAME: Aaron Becker    MR#:  [id number]  DATE OF BIRTH:  [birth year]  SUBJECTIVE:  CHIEF COMPLAINT:  Patient is resting comfortably. Denies abdominal pain had black tarry stool today. Wife and daughter at bedside requesting GI consult  REVIEW OF SYSTEMS:  CONSTITUTIONAL: No fever, fatigue or weakness.  EYES: No blurred or double vision.  EARS, NOSE, AND THROAT: No tinnitus or ear pain.  RESPIRATORY: No cough, shortness of breath, wheezing or hemoptysis.  CARDIOVASCULAR: No chest pain, orthopnea, edema.  GASTROINTESTINAL: No nausea, vomiting, diarrhea or abdominal pain. Reporting black tarry stool GENITOURINARY: No dysuria, hematuria.  ENDOCRINE: No polyuria, nocturia,  HEMATOLOGY: No anemia, easy bruising or bleeding SKIN: No rash or lesion. MUSCULOSKELETAL: No joint pain or arthritis.   NEUROLOGIC: No tingling, numbness, weakness.  PSYCHIATRY: No anxiety or depression.   DRUG ALLERGIES:   Allergies  Allergen Reactions  . Flomax [Tamsulosin Hcl] Other (See Comments)    syncope  . Ibuprofen Other (See Comments)    Oral blisters at high doses  . Rosuvastatin     REACTION: muscle stiffness  . Sulfonamide Derivatives Rash    VITALS:  Blood pressure (!) 142/78, pulse 76, temperature 98 F (36.7 C), temperature source Oral, resp. rate 19, height 5\' 2"  (1.575 m), weight 66.2 kg (146 lb), SpO2 99 %.  PHYSICAL EXAMINATION:  GENERAL:  83 y.o.-year-old patient lying in the bed with no acute distress.  EYES: Pupils equal, round, reactive to light and accommodation. No scleral icterus. Extraocular muscles intact.  HEENT: Head atraumatic, normocephalic. Oropharynx and nasopharynx clear.  NECK:  Supple, no jugular venous distention. No thyroid enlargement, no tenderness.  LUNGS: Normal breath sounds bilaterally, no wheezing, rales,rhonchi or crepitation. No use of accessory muscles of respiration.  CARDIOVASCULAR:  S1, S2 normal. No murmurs, rubs, or gallops.  ABDOMEN: Soft, nontender, nondistended. Bowel sounds present.  EXTREMITIES: No pedal edema, cyanosis, or clubbing.  NEUROLOGIC: Cranial nerves II through XII are intact. Muscle strength 5/5 in all extremities. Sensation intact. Gait not checked.  PSYCHIATRIC: The patient is alert and oriented x 3.  SKIN: No obvious rash, lesion, or ulcer.    LABORATORY PANEL:   CBC  Recent Labs Lab 06/10/17 0404  WBC 6.3  HGB 10.7*  HCT 30.8*  PLT 172   ------------------------------------------------------------------------------------------------------------------  Chemistries   Recent Labs Lab 06/09/17 1558 06/10/17 0404  NA 139 140  K 4.5 3.9  CL 106 109  CO2 25 26  GLUCOSE 228* 134*  BUN 54* 41*  CREATININE 1.16 1.05  CALCIUM 9.2 8.8*  AST 27  --   ALT 24  --   ALKPHOS 56  --   BILITOT 0.7  --    ------------------------------------------------------------------------------------------------------------------  Cardiac Enzymes No results for input(s): TROPONINI in the last 168 hours. ------------------------------------------------------------------------------------------------------------------  RADIOLOGY:  No results found.  EKG:   Orders placed or performed during the hospital encounter of 10/05/15  . EKG 12-Lead  . EKG 12-Lead  . EKG 12-Lead  . EKG 12-Lead    ASSESSMENT AND PLAN:    * GI bleed-Melena   Likely upper GI is the source  Family has requested GI consult-place GI consult   Monitor Hb, no transfusion now.   Full liquid diet-changed to clear liquid diet for possible procedure tomorrow -PPI     * DM    Hold oral meds, as on liquid diet   Keep on ISS Check hemoglobin A1c  *  Orthostatic hypotension    Hold HTN meds,  Orthostatics-not significant  * Hyperlipidemia    Cont home meds      All the records are reviewed and case discussed with Care Management/Social Workerr. Management plans  discussed with the patient, family and they are in agreement.  CODE STATUS:  fc   TOTAL TIME TAKING CARE OF THIS PATIENT: 36  minutes.   POSSIBLE D/C IN 1- 2  DAYS, DEPENDING ON CLINICAL CONDITION.  Note: This dictation was prepared with Dragon dictation along with smaller phrase technology. Any transcriptional errors that result from this process are unintentional.   Nicholes Mango M.D on 06/10/2017 at 4:08 PM  Between 7am to 6pm - Pager - [phone] After 6pm go to [email or web address] - password [email or web address] [facility]  Office  [phone]  CC: Primary care physician; Tonia Ghent, MD

## 2017-06-11 ENCOUNTER — Encounter
Admission: EM | Disposition: A | Discharge status: Home / Self Care | Payer: Self-pay | Source: Home / Self Care | Attending: Emergency Medicine

## 2017-06-11 ENCOUNTER — Observation Stay: Payer: Medicare Other | Admitting: Anesthesiology

## 2017-06-11 ENCOUNTER — Encounter: Payer: Self-pay | Admitting: Anesthesiology

## 2017-06-11 DIAGNOSIS — C160 Malignant neoplasm of cardia: Secondary | ICD-10-CM | POA: Diagnosis not present

## 2017-06-11 DIAGNOSIS — I951 Orthostatic hypotension: Secondary | ICD-10-CM | POA: Diagnosis not present

## 2017-06-11 DIAGNOSIS — Z8249 Family history of ischemic heart disease and other diseases of the circulatory system: Secondary | ICD-10-CM | POA: Diagnosis not present

## 2017-06-11 DIAGNOSIS — K209 Esophagitis, unspecified: Secondary | ICD-10-CM | POA: Diagnosis not present

## 2017-06-11 DIAGNOSIS — R42 Dizziness and giddiness: Secondary | ICD-10-CM | POA: Diagnosis not present

## 2017-06-11 DIAGNOSIS — E119 Type 2 diabetes mellitus without complications: Secondary | ICD-10-CM | POA: Diagnosis not present

## 2017-06-11 DIAGNOSIS — Z882 Allergy status to sulfonamides status: Secondary | ICD-10-CM | POA: Diagnosis not present

## 2017-06-11 DIAGNOSIS — K648 Other hemorrhoids: Secondary | ICD-10-CM | POA: Diagnosis not present

## 2017-06-11 DIAGNOSIS — K621 Rectal polyp: Secondary | ICD-10-CM | POA: Diagnosis not present

## 2017-06-11 DIAGNOSIS — K2210 Ulcer of esophagus without bleeding: Secondary | ICD-10-CM | POA: Diagnosis not present

## 2017-06-11 DIAGNOSIS — K922 Gastrointestinal hemorrhage, unspecified: Secondary | ICD-10-CM | POA: Diagnosis not present

## 2017-06-11 DIAGNOSIS — Z808 Family history of malignant neoplasm of other organs or systems: Secondary | ICD-10-CM | POA: Diagnosis not present

## 2017-06-11 DIAGNOSIS — K259 Gastric ulcer, unspecified as acute or chronic, without hemorrhage or perforation: Secondary | ICD-10-CM | POA: Diagnosis not present

## 2017-06-11 DIAGNOSIS — K254 Chronic or unspecified gastric ulcer with hemorrhage: Secondary | ICD-10-CM | POA: Diagnosis not present

## 2017-06-11 DIAGNOSIS — Z86010 Personal history of colonic polyps: Secondary | ICD-10-CM | POA: Diagnosis not present

## 2017-06-11 DIAGNOSIS — Z803 Family history of malignant neoplasm of breast: Secondary | ICD-10-CM | POA: Diagnosis not present

## 2017-06-11 DIAGNOSIS — C155 Malignant neoplasm of lower third of esophagus: Secondary | ICD-10-CM | POA: Diagnosis not present

## 2017-06-11 DIAGNOSIS — E785 Hyperlipidemia, unspecified: Secondary | ICD-10-CM | POA: Diagnosis not present

## 2017-06-11 DIAGNOSIS — Z800 Family history of malignant neoplasm of digestive organs: Secondary | ICD-10-CM | POA: Diagnosis not present

## 2017-06-11 DIAGNOSIS — K921 Melena: Secondary | ICD-10-CM

## 2017-06-11 DIAGNOSIS — R001 Bradycardia, unspecified: Secondary | ICD-10-CM | POA: Diagnosis not present

## 2017-06-11 DIAGNOSIS — E039 Hypothyroidism, unspecified: Secondary | ICD-10-CM | POA: Diagnosis not present

## 2017-06-11 DIAGNOSIS — I10 Essential (primary) hypertension: Secondary | ICD-10-CM | POA: Diagnosis not present

## 2017-06-11 DIAGNOSIS — H9190 Unspecified hearing loss, unspecified ear: Secondary | ICD-10-CM | POA: Diagnosis not present

## 2017-06-11 HISTORY — PX: FLEXIBLE SIGMOIDOSCOPY: SHX5431

## 2017-06-11 HISTORY — PX: ESOPHAGOGASTRODUODENOSCOPY (EGD) WITH PROPOFOL: SHX5813

## 2017-06-11 LAB — CBC
HEMATOCRIT: 32.6 % — AB (ref 40.0–52.0)
HEMOGLOBIN: 11.2 g/dL — AB (ref 13.0–18.0)
MCH: 31 pg (ref 26.0–34.0)
MCHC: 34.2 g/dL (ref 32.0–36.0)
MCV: 90.6 fL (ref 80.0–100.0)
PLATELETS: 181 10*3/uL (ref 150–440)
RBC: 3.6 MIL/uL — ABNORMAL LOW (ref 4.40–5.90)
RDW: 13.7 % (ref 11.5–14.5)
WBC: 7.1 10*3/uL (ref 3.8–10.6)

## 2017-06-11 LAB — BASIC METABOLIC PANEL
ANION GAP: 6 (ref 5–15)
BUN: 23 mg/dL — ABNORMAL HIGH (ref 6–20)
CALCIUM: 8.7 mg/dL — AB (ref 8.9–10.3)
CO2: 26 mmol/L (ref 22–32)
Chloride: 108 mmol/L (ref 101–111)
Creatinine, Ser: 1.11 mg/dL (ref 0.61–1.24)
GFR, EST NON AFRICAN AMERICAN: 59 mL/min — AB (ref >60)
Glucose, Bld: 162 mg/dL — ABNORMAL HIGH (ref 65–99)
Potassium: 3.7 mmol/L (ref 3.5–5.1)
SODIUM: 140 mmol/L (ref 135–145)

## 2017-06-11 LAB — GLUCOSE, CAPILLARY
GLUCOSE-CAPILLARY: 167 mg/dL — AB (ref 65–99)
GLUCOSE-CAPILLARY: 192 mg/dL — AB (ref 65–99)

## 2017-06-11 LAB — HEMOGLOBIN A1C
Hgb A1c MFr Bld: 7 % — ABNORMAL HIGH (ref 4.8–5.6)
MEAN PLASMA GLUCOSE: 154.2 mg/dL

## 2017-06-11 SURGERY — ESOPHAGOGASTRODUODENOSCOPY (EGD) WITH PROPOFOL
Anesthesia: General

## 2017-06-11 MED ORDER — PHENYLEPHRINE HCL 10 MG/ML IJ SOLN
INTRAMUSCULAR | Status: DC | PRN
  Administered 2017-06-11 (×2): 50 ug via INTRAVENOUS

## 2017-06-11 MED ORDER — GLYCOPYRROLATE 0.2 MG/ML IJ SOLN
INTRAMUSCULAR | Status: AC
  Filled 2017-06-11: qty 1

## 2017-06-11 MED ORDER — LIDOCAINE HCL (CARDIAC) 20 MG/ML IV SOLN
INTRAVENOUS | Status: DC | PRN
  Administered 2017-06-11: 30 mg via INTRAVENOUS

## 2017-06-11 MED ORDER — MIDAZOLAM HCL 2 MG/2ML IJ SOLN
INTRAMUSCULAR | Status: AC
  Filled 2017-06-11: qty 2

## 2017-06-11 MED ORDER — EPHEDRINE SULFATE 50 MG/ML IJ SOLN
INTRAMUSCULAR | Status: DC | PRN
  Administered 2017-06-11: 10 mg via INTRAVENOUS

## 2017-06-11 MED ORDER — LIDOCAINE HCL (PF) 2 % IJ SOLN
INTRAMUSCULAR | Status: AC
  Filled 2017-06-11: qty 2

## 2017-06-11 MED ORDER — GLYCOPYRROLATE 0.2 MG/ML IJ SOLN
INTRAMUSCULAR | Status: DC | PRN
  Administered 2017-06-11: 0.1 mg via INTRAVENOUS

## 2017-06-11 MED ORDER — PANTOPRAZOLE SODIUM 40 MG PO TBEC: 40.0000 mg | DELAYED_RELEASE_TABLET | Freq: Two times a day (BID) | ORAL | 1 refills | Status: DC

## 2017-06-11 MED ORDER — PROPOFOL 500 MG/50ML IV EMUL
INTRAVENOUS | Status: AC
  Filled 2017-06-11: qty 50

## 2017-06-11 MED ORDER — FENTANYL CITRATE (PF) 100 MCG/2ML IJ SOLN
INTRAMUSCULAR | Status: DC | PRN
  Administered 2017-06-11: 50 ug via INTRAVENOUS

## 2017-06-11 MED ORDER — SODIUM CHLORIDE 0.9 % IV SOLN
INTRAVENOUS | Status: DC
  Administered 2017-06-11: 1000 mL via INTRAVENOUS

## 2017-06-11 MED ORDER — PROPOFOL 500 MG/50ML IV EMUL
INTRAVENOUS | Status: DC | PRN
  Administered 2017-06-11: 100 ug/kg/min via INTRAVENOUS

## 2017-06-11 MED ORDER — FENTANYL CITRATE (PF) 100 MCG/2ML IJ SOLN
INTRAMUSCULAR | Status: AC
  Filled 2017-06-11: qty 2

## 2017-06-11 MED ORDER — FLUMAZENIL 0.5 MG/5ML IV SOLN
INTRAVENOUS | Status: AC
  Filled 2017-06-11: qty 5

## 2017-06-11 MED ORDER — MIDAZOLAM HCL 2 MG/2ML IJ SOLN
INTRAMUSCULAR | Status: DC | PRN
  Administered 2017-06-11: 1 mg via INTRAVENOUS

## 2017-06-11 NOTE — Anesthesia Procedure Notes (Signed)
Performed by: Vaughan Sine Pre-anesthesia Checklist: Emergency Drugs available, Patient identified, Suction available, Patient being monitored and Timeout performed Patient Re-evaluated:Patient Re-evaluated prior to induction Oxygen Delivery Method: Nasal cannula Preoxygenation: Pre-oxygenation with 100% oxygen Induction Type: IV induction Airway Equipment and Method: Bite block Placement Confirmation: CO2 detector and positive ETCO2

## 2017-06-11 NOTE — Discharge Instructions (Signed)
Follow-up with primary care physician in a week Follow-up with gastroenterology Dr. Allen Norris in 7-10 days

## 2017-06-11 NOTE — Anesthesia Post-op Follow-up Note (Signed)
Anesthesia QCDR form completed.        

## 2017-06-11 NOTE — Anesthesia Postprocedure Evaluation (Signed)
Anesthesia Post Note  Patient: Aaron Becker  Procedure(s) Performed: Procedure(s) (LRB): ESOPHAGOGASTRODUODENOSCOPY (EGD) WITH PROPOFOL (N/A) FLEXIBLE SIGMOIDOSCOPY (N/A)  Patient location during evaluation: PACU Anesthesia Type: General Level of consciousness: awake and alert and oriented Pain management: pain level controlled Vital Signs Assessment: post-procedure vital signs reviewed and stable Respiratory status: spontaneous breathing Cardiovascular status: blood pressure returned to baseline Anesthetic complications: no     Last Vitals:  Vitals:   06/11/17 1032 06/11/17 1150  BP: 120/71 125/72  Pulse: 80 82  Resp: 14 17  Temp:  36.7 C  SpO2: 98% 97%    Last Pain:  Vitals:   06/11/17 1150  TempSrc: Oral  PainSc:                  Meganne Rita

## 2017-06-11 NOTE — Op Note (Signed)
[facility] Gastroenterology Patient Name: Aaron Becker Procedure Date: 06/11/2017 9:31 AM MRN: [id number] Account #: [id number] Date of Birth: Dec 26, 1929 Admit Type: Inpatient Age: 83 Room: [facility] ENDO ROOM 4 Gender: Male Note Status: Finalized Procedure:            Upper GI endoscopy Indications:          Melena Providers:            Lucilla Lame MD, MD Referring MD:         Elveria Rising. Damita Dunnings, MD (Referring MD) Medicines:            Propofol per Anesthesia Complications:        No immediate complications. Procedure:            Pre-Anesthesia Assessment:                       - Prior to the procedure, a History and Physical was                        performed, and patient medications and allergies were                        reviewed. The patient's tolerance of previous                        anesthesia was also reviewed. The risks and benefits of                        the procedure and the sedation options and risks were                        discussed with the patient. All questions were                        answered, and informed consent was obtained. Prior                        Anticoagulants: The patient has taken no previous                        anticoagulant or antiplatelet agents. ASA Grade                        Assessment: II - A patient with mild systemic disease.                        After reviewing the risks and benefits, the patient was                        deemed in satisfactory condition to undergo the                        procedure.                       After obtaining informed consent, the endoscope was                        passed under direct vision. Throughout the procedure,  the patient's blood pressure, pulse, and oxygen                        saturations were monitored continuously. The Endoscope                        was introduced through the mouth, and advanced to the                        second part  of duodenum. The upper GI endoscopy was                        accomplished without difficulty. The patient tolerated                        the procedure well. Findings:      One cratered esophageal ulcer and stigmata of recent bleeding was found       at the gastroesophageal junction. Biopsies were taken with a cold       forceps for histology.      One non-bleeding cratered gastric ulcer with no stigmata of bleeding was       found in the gastric antrum.      The examined duodenum was normal. Impression:           - Esophageal ulcer. Biopsied.                       - Non-bleeding gastric ulcer with no stigmata of                        bleeding.                       - Normal examined duodenum. Recommendation:       - Await pathology results.                       - Return patient to hospital ward for ongoing care. Procedure Code(s):    --- Professional ---                       [phone], Esophagogastroduodenoscopy, flexible, transoral;                        with biopsy, single or multiple Diagnosis Code(s):    --- Professional ---                       K92.1, Melena (includes Hematochezia)                       K22.10, Ulcer of esophagus without bleeding                       K25.9, Gastric ulcer, unspecified as acute or chronic,                        without hemorrhage or perforation CPT copyright 2016 [employer] Medical Association. All rights reserved. The codes documented in this report are preliminary and upon coder review may  be revised to meet current compliance requirements. Lucilla Lame MD, MD 06/11/2017 9:44:26 AM This report has been signed electronically. Number of Addenda: 0  Note Initiated On: 06/11/2017 9:31 AM      [facility]

## 2017-06-11 NOTE — Discharge Summary (Signed)
[facility] NAME: Aaron Becker    MR#:  [id number]  DATE OF BIRTH:  1930-05-08  DATE OF ADMISSION:  06/09/2017 ADMITTING PHYSICIAN: Vaughan Basta, MD  DATE OF DISCHARGE:  06/11/17  PRIMARY CARE PHYSICIAN: Tonia Ghent, MD    ADMISSION DIAGNOSIS:  UGIB (upper gastrointestinal bleed) [K92.2]  DISCHARGE DIAGNOSIS:   GI bleed- melena Nonbleeding oesophageal ulcer SECONDARY DIAGNOSIS:   Past Medical History:  Diagnosis Date  . Arthritis    hands  . Bradycardia   . Colon polyp   . Diabetes mellitus, type II ([place]) 1986   oral meds  . Elevated PSA 2011   per Dr. Jacqlyn Larsen with Uro  . [facility] (hard of hearing)    bilateral AIDES  . Hyperlipidemia   . Hypertension 02/02   controlled on meds  . Hypothyroidism 1980's  . Palpitations   . Right rib fracture   . Sleep apnea   . SVT (supraventricular tachycardia) ([place])    a. reported SVT during admission at [facility] 06/2014  . Syncope and collapse   . Wears dentures    upper and lower    HOSPITAL COURSE:   HISTORY OF PRESENT ILLNESS: Aaron Becker  is a 83 y.o. male with a known history of Arthritis, DM, Htn, HLD, Hypothyroidism, SVT, Sleep apnea- had blood in stool ( Dark colour) twice todayand felt dizzi. In ER noted guiac positive, and orthostatic. Hb stable. Took one aleve yesterday, but not regularly  * GI bleed-Melena Patient had EGD and Flexeril sigmoidoscopy done today eGD has revealed created esophageal ulcer nonbleeding, biopsies are done Flexible sigmoidoscopy has revealed polyp which was biopsied Patient is tolerating diet today after procedure Okay to discharge patient from GI standpoint with PPI Outpatient follow-up in 7-10 days Hemoglobin is stable 10.7-11.2 today Discontinue NSAIDs     * DM resume home  oral meds sliding scale insulin provided during the hospital course Hemoglobin A1c 7.0  * Orthostatic hypotension Hold HTN meds as  blood pressure is soft. PCP can consider resuming if blood pressure is elevated Orthostatics-not significant  * Hyperlipidemia Cont home meds Pravachol  DISCHARGE CONDITIONS:   fair  CONSULTS OBTAINED:  Treatment Team:  Robert Bellow, MD Lucilla Lame, MD   PROCEDURES -EGD and rectal sigmoidoscopy today-9-18 -2018  DRUG ALLERGIES:   Allergies  Allergen Reactions  . Flomax [Tamsulosin Hcl] Other (See Comments)    syncope  . Ibuprofen Other (See Comments)    Oral blisters at high doses  . Rosuvastatin     REACTION: muscle stiffness  . Sulfonamide Derivatives Rash    DISCHARGE MEDICATIONS:   Current Discharge Medication List    START taking these medications   Details  pantoprazole (PROTONIX) 40 MG tablet Take 1 tablet (40 mg total) by mouth 2 (two) times daily. Qty: 60 tablet, Refills: 1      CONTINUE these medications which have NOT CHANGED   Details  diphenhydrAMINE (BENADRYL) 25 mg capsule Take 25 mg by mouth as needed. PM    Docusate Calcium (STOOL SOFTENER PO) Take 1 tablet by mouth as needed. PM     glimepiride (AMARYL) 1 MG tablet Take 2 tablets (2 mg total) by mouth daily with breakfast. Qty: 180 tablet, Refills: 3    levothyroxine (SYNTHROID, LEVOTHROID) 75 MCG tablet Take 1 tablet (75 mcg total) by mouth daily. Qty: 90 tablet, Refills: 3    metFORMIN (GLUCOPHAGE) 500 MG tablet TAKE 1 TABLET BY MOUTH EVERY  MORNING, 2 TABLETS AT SUPPER , THEN 1 EXTRA TAB AT NIGHT IF NEEDED Qty: 360 tablet, Refills: 3    Multiple Vitamins-Minerals (CENTRUM SILVER PO) Take by mouth daily. AM    Omega-3 Fatty Acids (FISH OIL) 1000 MG CAPS Take by mouth daily. Reported on 03/13/2016    pravastatin (PRAVACHOL) 80 MG tablet Take 1 tablet (80 mg total) by mouth daily. Qty: 90 tablet, Refills: 3    pyridOXINE (VITAMIN B-6) 100 MG tablet Take 100 mg by mouth daily. AM    vitamin C (ASCORBIC ACID) 500 MG tablet Take 500 mg by mouth daily. AM    glucose blood (ONE  TOUCH ULTRA TEST) test strip TO TEST BLOOD SUGAR ONCE DAILY AND AS DIRECTED DX E11.9 Qty: 100 each, Refills: 3      STOP taking these medications     benazepril (LOTENSIN) 5 MG tablet      hydrochlorothiazide (HYDRODIURIL) 12.5 MG tablet      naproxen sodium (ANAPROX) 220 MG tablet          DISCHARGE INSTRUCTIONS:  Follow-up with primary care physician in a week Follow-up with gastroenterology Dr. Allen Norris in 7-10 days   DIET:  Diabetic diet  DISCHARGE CONDITION:  Stable  ACTIVITY:  Activity as tolerated  OXYGEN:  Home Oxygen: No.   Oxygen Delivery: room air  DISCHARGE LOCATION:  home   If you experience worsening of your admission symptoms, develop shortness of breath, life threatening emergency, suicidal or homicidal thoughts you must seek medical attention immediately by calling 911 or calling your MD immediately  if symptoms less severe.  You Must read complete instructions/literature along with all the possible adverse reactions/side effects for all the Medicines you take and that have been prescribed to you. Take any new Medicines after you have completely understood and accpet all the possible adverse reactions/side effects.   Please note  You were cared for by a hospitalist during your hospital stay. If you have any questions about your discharge medications or the care you received while you were in the hospital after you are discharged, you can call the unit and asked to speak with the hospitalist on call if the hospitalist that took care of you is not available. Once you are discharged, your primary care physician will handle any further medical issues. Please note that NO REFILLS for any discharge medications will be authorized once you are discharged, as it is imperative that you return to your primary care physician (or establish a relationship with a primary care physician if you do not have one) for your aftercare needs so that they can reassess your need for  medications and monitor your lab values.     Today  Chief Complaint  Patient presents with  . Abdominal Pain    Patient had EGD and flex sigmoidoscopy today. Tolerated procedure well.tolerating advanced diet. Denies any abdominal pain. No more episodes of bleeding. Wants to go home. Okay to discharge patient from GI standpoint  ROS:  CONSTITUTIONAL: Denies fevers, chills. Denies any fatigue, weakness.  EYES: Denies blurry vision, double vision, eye pain. EARS, NOSE, THROAT: Denies tinnitus, ear pain, hearing loss. RESPIRATORY: Denies cough, wheeze, shortness of breath.  CARDIOVASCULAR: Denies chest pain, palpitations, edema.  GASTROINTESTINAL: Denies nausea, vomiting, diarrhea, abdominal pain. Denies bright red blood per rectum. GENITOURINARY: Denies dysuria, hematuria. ENDOCRINE: Denies nocturia or thyroid problems. HEMATOLOGIC AND LYMPHATIC: Denies easy bruising or bleeding. SKIN: Denies rash or lesion. MUSCULOSKELETAL: Denies pain in neck, back, shoulder, knees, hips or  arthritic symptoms.  NEUROLOGIC: Denies paralysis, paresthesias.  PSYCHIATRIC: Denies anxiety or depressive symptoms.   VITAL SIGNS:  Blood pressure 125/72, pulse 82, temperature 98 F (36.7 C), temperature source Oral, resp. rate 17, height 5\' 2"  (1.575 m), weight 66.2 kg (146 lb), SpO2 97 %.  I/O:    Intake/Output Summary (Last 24 hours) at 06/11/17 1328 Last data filed at 06/11/17 0945  Gross per 24 hour  Intake          1413.67 ml  Output              925 ml  Net           488.67 ml    PHYSICAL EXAMINATION:  GENERAL:  83 y.o.-year-old patient lying in the bed with no acute distress.  EYES: Pupils equal, round, reactive to light and accommodation. No scleral icterus. Extraocular muscles intact.  HEENT: Head atraumatic, normocephalic. Oropharynx and nasopharynx clear.  NECK:  Supple, no jugular venous distention. No thyroid enlargement, no tenderness.  LUNGS: Normal breath sounds bilaterally, no  wheezing, rales,rhonchi or crepitation. No use of accessory muscles of respiration.  CARDIOVASCULAR: S1, S2 normal. No murmurs, rubs, or gallops.  ABDOMEN: Soft, non-tender, non-distended. Bowel sounds present. No organomegaly or mass.  EXTREMITIES: No pedal edema, cyanosis, or clubbing.  NEUROLOGIC: Cranial nerves II through XII are intact. Muscle strength 5/5 in all extremities. Sensation intact. Gait not checked.  PSYCHIATRIC: The patient is alert and oriented x 3.  SKIN: No obvious rash, lesion, or ulcer.   DATA REVIEW:   CBC  Recent Labs Lab 06/11/17 0422  WBC 7.1  HGB 11.2*  HCT 32.6*  PLT 181    Chemistries   Recent Labs Lab 06/09/17 1558  06/11/17 0422  NA 139  < > 140  K 4.5  < > 3.7  CL 106  < > 108  CO2 25  < > 26  GLUCOSE 228*  < > 162*  BUN 54*  < > 23*  CREATININE 1.16  < > 1.11  CALCIUM 9.2  < > 8.7*  AST 27  --   --   ALT 24  --   --   ALKPHOS 56  --   --   BILITOT 0.7  --   --   < > = values in this interval not displayed.  Cardiac Enzymes No results for input(s): TROPONINI in the last 168 hours.  Microbiology Results  Results for orders placed or performed during the hospital encounter of 10/05/15  Surgical pcr screen     Status: None   Collection Time: 10/05/15 12:29 PM  Result Value Ref Range Status   MRSA, PCR NEGATIVE NEGATIVE Final   Staphylococcus aureus NEGATIVE NEGATIVE Final    Comment:        The Xpert SA Assay (FDA approved for NASAL specimens in patients over 37 years of age), is one component of a comprehensive surveillance program.  Test performance has been validated by [facility] for patients greater than or equal to 65 year old. It is not intended to diagnose infection nor to guide or monitor treatment.     RADIOLOGY:  No results found.  EKG:   Orders placed or performed during the hospital encounter of 10/05/15  . EKG 12-Lead  . EKG 12-Lead  . EKG 12-Lead  . EKG 12-Lead      Management plans discussed  with the patient, family and they are in agreement.  CODE STATUS:     Code Status  Orders        Start     Ordered   06/09/17 1945  Full code  Continuous     06/09/17 1944    Code Status History    Date Active Date Inactive Code Status Order ID Comments User Context   This patient has a current code status but no historical code status.      TOTAL TIME TAKING CARE OF THIS PATIENT: 45 minutes.   Note: This dictation was prepared with Dragon dictation along with smaller phrase technology. Any transcriptional errors that result from this process are unintentional.   @MEC @  on 06/11/2017 at 1:28 PM  Between 7am to 6pm - Pager - [phone]  After 6pm go to [email or web address] - password [email or web address] [facility]  Office  [phone]  CC: Primary care physician; Tonia Ghent, MD

## 2017-06-11 NOTE — Progress Notes (Signed)
Aaron Becker to be D/C'd home per MD order.  Discussed prescriptions and follow up appointments with the patient. Prescriptions given to patient, medication list explained in detail. Pt verbalized understanding.  Allergies as of 06/11/2017      Reactions   Flomax [tamsulosin Hcl] Other (See Comments)   syncope   Ibuprofen Other (See Comments)   Oral blisters at high doses   Rosuvastatin    REACTION: muscle stiffness   Sulfonamide Derivatives Rash      Medication List    STOP taking these medications   benazepril 5 MG tablet Commonly known as:  LOTENSIN   hydrochlorothiazide 12.5 MG tablet Commonly known as:  HYDRODIURIL   naproxen sodium 220 MG tablet Commonly known as:  ANAPROX     TAKE these medications   CENTRUM SILVER PO Take by mouth daily. AM   diphenhydrAMINE 25 mg capsule Commonly known as:  BENADRYL Take 25 mg by mouth as needed. PM   Fish Oil 1000 MG Caps Take by mouth daily. Reported on 03/13/2016   glimepiride 1 MG tablet Commonly known as:  AMARYL Take 2 tablets (2 mg total) by mouth daily with breakfast.   glucose blood test strip Commonly known as:  ONE TOUCH ULTRA TEST TO TEST BLOOD SUGAR ONCE DAILY AND AS DIRECTED DX E11.9   levothyroxine 75 MCG tablet Commonly known as:  SYNTHROID, LEVOTHROID Take 1 tablet (75 mcg total) by mouth daily.   metFORMIN 500 MG tablet Commonly known as:  GLUCOPHAGE TAKE 1 TABLET BY MOUTH EVERY MORNING, 2 TABLETS AT SUPPER , THEN 1 EXTRA TAB AT NIGHT IF NEEDED   pantoprazole 40 MG tablet Commonly known as:  PROTONIX Take 1 tablet (40 mg total) by mouth 2 (two) times daily.   pravastatin 80 MG tablet Commonly known as:  PRAVACHOL Take 1 tablet (80 mg total) by mouth daily.   pyridOXINE 100 MG tablet Commonly known as:  VITAMIN B-6 Take 100 mg by mouth daily. AM   STOOL SOFTENER PO Take 1 tablet by mouth as needed. PM   vitamin C 500 MG tablet Commonly known as:  ASCORBIC ACID Take 500 mg by mouth daily.  AM            Discharge Care Instructions        Start     Ordered   06/11/17 0000  pantoprazole (PROTONIX) 40 MG tablet  2 times daily     06/11/17 1328      Vitals:   06/11/17 1032 06/11/17 1150  BP: 120/71 125/72  Pulse: 80 82  Resp: 14 17  Temp:  98 F (36.7 C)  SpO2: 98% 97%    Skin clean, dry and intact without evidence of skin break down, no evidence of skin tears noted. IV catheter discontinued intact. Site without signs and symptoms of complications. Dressing and pressure applied. Pt denies pain at this time. No complaints noted.  An After Visit Summary was printed and given to the patient. Patient escorted via [place], and D/C home via private auto.  Aaron Becker

## 2017-06-11 NOTE — Op Note (Signed)
[facility] Gastroenterology Patient Name: Aaron Becker Procedure Date: 06/11/2017 9:30 AM MRN: [id number] Account #: [id number] Date of Birth: [birth year] Admit Type: Inpatient Age: 83 Room: [facility] ENDO ROOM 4 Gender: Male Note Status: Finalized Procedure:            Flexible Sigmoidoscopy Indications:          Melena Providers:            Lucilla Lame MD, MD Referring MD:         Elveria Rising. Damita Dunnings, MD (Referring MD) Medicines:            Propofol per Anesthesia Complications:        No immediate complications. Procedure:            Pre-Anesthesia Assessment:                       - Prior to the procedure, a History and Physical was                        performed, and patient medications and allergies were                        reviewed. The patient's tolerance of previous                        anesthesia was also reviewed. The risks and benefits of                        the procedure and the sedation options and risks were                        discussed with the patient. All questions were                        answered, and informed consent was obtained. Prior                        Anticoagulants: The patient has taken no previous                        anticoagulant or antiplatelet agents. ASA Grade                        Assessment: II - A patient with mild systemic disease.                        After reviewing the risks and benefits, the patient was                        deemed in satisfactory condition to undergo the                        procedure.                       After obtaining informed consent, the scope was passed                        under direct vision. The Colonoscope was introduced  through the anus and advanced to the the rectosigmoid                        junction. The flexible sigmoidoscopy was accomplished                        without difficulty. The patient tolerated the procedure   well. The quality of the bowel preparation was fair. Findings:      The perianal and digital rectal examinations were normal.      A polyp was found in the rectum. The polyp was sessile.      Non-bleeding internal hemorrhoids were found during retroflexion. The       hemorrhoids were Grade II (internal hemorrhoids that prolapse but reduce       spontaneously). Impression:           - Preparation of the colon was fair.                       - One polyp in the rectum.                       - No specimens collected.                       - The lesion seen on the colonoscopy 2 years ago was                        still present but not bleeding. Recommendation:       - Return patient to hospital ward for ongoing care. Procedure Code(s):    --- Professional ---                       [phone], 52, Sigmoidoscopy, flexible; diagnostic,                        including collection of specimen(s) by brushing or                        washing, when performed (separate procedure) Diagnosis Code(s):    --- Professional ---                       K92.1, Melena (includes Hematochezia)                       K62.1, Rectal polyp CPT copyright 2016 American Medical Association. All rights reserved. The codes documented in this report are preliminary and upon coder review may  be revised to meet current compliance requirements. Lucilla Lame MD, MD 06/11/2017 9:48:46 AM This report has been signed electronically. Number of Addenda: 0 Note Initiated On: 06/11/2017 9:30 AM      [facility]

## 2017-06-11 NOTE — Transfer of Care (Signed)
  Immediate Anesthesia Transfer of Care Note  Patient: Aaron Becker  Procedure(s) Performed: Procedure(s): ESOPHAGOGASTRODUODENOSCOPY (EGD) WITH PROPOFOL (N/A) FLEXIBLE SIGMOIDOSCOPY (N/A)  Patient Location: PACU  Anesthesia Type:General  Level of Consciousness: awake and sedated  Airway & Oxygen Therapy: Patient Spontanous Breathing and Patient connected to nasal cannula oxygen  Post-op Assessment: Report given to RN and Post -op Vital signs reviewed and stable  Post vital signs: Reviewed and stable  Last Vitals:  Vitals:   06/11/17 0450 06/11/17 0908  BP: 132/69 (!) 159/78  Pulse: 71 76  Resp: 20 17  Temp: 36.7 C 37 C  SpO2: 98% 98%    Last Pain:  Vitals:   06/11/17 0450  TempSrc: Oral  PainSc:          Complications: No apparent anesthesia complications

## 2017-06-11 NOTE — Anesthesia Preprocedure Evaluation (Signed)
Anesthesia Evaluation  Patient identified by MRN, date of birth, ID band Patient awake    Reviewed: Allergy & Precautions, H&P , NPO status , Patient's Chart, lab work & pertinent test results  History of Anesthesia Complications Negative for: history of anesthetic complications  Airway Mallampati: II  TM Distance: >3 FB Neck ROM: full    Dental no notable dental hx. (+) Upper Dentures, Lower Dentures   Pulmonary neg pulmonary ROS, sleep apnea ,    Pulmonary exam normal breath sounds clear to auscultation       Cardiovascular hypertension, Pt. on medications negative cardio ROS Normal cardiovascular exam Rhythm:regular Rate:Normal     Neuro/Psych negative neurological ROS  negative psych ROS   GI/Hepatic Neg liver ROS, GI bleed   Endo/Other  negative endocrine ROSdiabetes, Well Controlled, Type 2Hypothyroidism   Renal/GU negative Renal ROS  negative genitourinary   Musculoskeletal  (+) Arthritis , Osteoarthritis,    Abdominal   Peds  Hematology negative hematology ROS (+)   Anesthesia Other Findings Past Medical History: No date: Arthritis     Comment:  hands No date: Bradycardia No date: Colon polyp 1986: Diabetes mellitus, type II ([facility])     Comment:  oral meds 2011: Elevated PSA     Comment:  per Dr. Jacqlyn Larsen with Uro No date: [facility] (hard of hearing)     Comment:  bilateral AIDES No date: Hyperlipidemia 02/02: Hypertension     Comment:  controlled on meds 1980's: Hypothyroidism No date: Palpitations No date: Right rib fracture No date: Sleep apnea No date: SVT (supraventricular tachycardia) ([place])     Comment:  a. reported SVT during admission at [facility] 06/2014 No date: Syncope and collapse No date: Wears dentures     Comment:  upper and lower  Reproductive/Obstetrics negative OB ROS                             Anesthesia Physical  Anesthesia Plan  ASA: III  Anesthesia  Plan: General   Post-op Pain Management:    Induction: Intravenous  PONV Risk Score and Plan:   Airway Management Planned: Nasal Cannula  Additional Equipment:   Intra-op Plan:   Post-operative Plan:   Informed Consent: I have reviewed the patients History and Physical, chart, labs and discussed the procedure including the risks, benefits and alternatives for the proposed anesthesia with the patient or authorized representative who has indicated his/her understanding and acceptance.     Plan Discussed with: Anesthesiologist, CRNA and Surgeon  Anesthesia Plan Comments:         Anesthesia Quick Evaluation

## 2017-06-12 ENCOUNTER — Encounter: Payer: Self-pay | Admitting: Gastroenterology

## 2017-06-14 ENCOUNTER — Telehealth: Payer: Self-pay | Admitting: Family Medicine

## 2017-06-14 ENCOUNTER — Inpatient Hospital Stay
Admission: EM | Admit: 2017-06-14 | Discharge: 2017-06-16 | DRG: 379 | Disposition: A | Discharge status: Home Health | Payer: Medicare Other | Attending: Internal Medicine | Admitting: Internal Medicine

## 2017-06-14 ENCOUNTER — Emergency Department: Payer: Medicare Other

## 2017-06-14 DIAGNOSIS — K254 Chronic or unspecified gastric ulcer with hemorrhage: Principal | ICD-10-CM | POA: Diagnosis present

## 2017-06-14 DIAGNOSIS — K648 Other hemorrhoids: Secondary | ICD-10-CM | POA: Diagnosis present

## 2017-06-14 DIAGNOSIS — I951 Orthostatic hypotension: Secondary | ICD-10-CM | POA: Diagnosis present

## 2017-06-14 DIAGNOSIS — Z882 Allergy status to sulfonamides status: Secondary | ICD-10-CM | POA: Diagnosis not present

## 2017-06-14 DIAGNOSIS — R001 Bradycardia, unspecified: Secondary | ICD-10-CM | POA: Diagnosis not present

## 2017-06-14 DIAGNOSIS — R42 Dizziness and giddiness: Secondary | ICD-10-CM | POA: Diagnosis present

## 2017-06-14 DIAGNOSIS — Z86010 Personal history of colonic polyps: Secondary | ICD-10-CM

## 2017-06-14 DIAGNOSIS — Z808 Family history of malignant neoplasm of other organs or systems: Secondary | ICD-10-CM | POA: Diagnosis not present

## 2017-06-14 DIAGNOSIS — E785 Hyperlipidemia, unspecified: Secondary | ICD-10-CM | POA: Diagnosis present

## 2017-06-14 DIAGNOSIS — Z800 Family history of malignant neoplasm of digestive organs: Secondary | ICD-10-CM

## 2017-06-14 DIAGNOSIS — Z8249 Family history of ischemic heart disease and other diseases of the circulatory system: Secondary | ICD-10-CM

## 2017-06-14 DIAGNOSIS — K621 Rectal polyp: Secondary | ICD-10-CM | POA: Diagnosis present

## 2017-06-14 DIAGNOSIS — Z803 Family history of malignant neoplasm of breast: Secondary | ICD-10-CM | POA: Diagnosis not present

## 2017-06-14 DIAGNOSIS — K921 Melena: Secondary | ICD-10-CM | POA: Diagnosis not present

## 2017-06-14 DIAGNOSIS — E039 Hypothyroidism, unspecified: Secondary | ICD-10-CM | POA: Diagnosis present

## 2017-06-14 DIAGNOSIS — H9190 Unspecified hearing loss, unspecified ear: Secondary | ICD-10-CM | POA: Diagnosis present

## 2017-06-14 DIAGNOSIS — K2210 Ulcer of esophagus without bleeding: Secondary | ICD-10-CM | POA: Diagnosis not present

## 2017-06-14 DIAGNOSIS — K922 Gastrointestinal hemorrhage, unspecified: Secondary | ICD-10-CM | POA: Diagnosis not present

## 2017-06-14 DIAGNOSIS — E119 Type 2 diabetes mellitus without complications: Secondary | ICD-10-CM | POA: Diagnosis present

## 2017-06-14 LAB — HEMOGLOBIN AND HEMATOCRIT, BLOOD
HEMATOCRIT: 30 % — AB (ref 40.0–52.0)
Hemoglobin: 10.3 g/dL — ABNORMAL LOW (ref 13.0–18.0)

## 2017-06-14 LAB — URINALYSIS, COMPLETE (UACMP) WITH MICROSCOPIC
Bacteria, UA: NONE SEEN
Bilirubin Urine: NEGATIVE
GLUCOSE, UA: 150 mg/dL — AB
Hgb urine dipstick: NEGATIVE
Ketones, ur: NEGATIVE mg/dL
Leukocytes, UA: NEGATIVE
Nitrite: NEGATIVE
PH: 5 (ref 5.0–8.0)
PROTEIN: 30 mg/dL — AB
RBC / HPF: NONE SEEN RBC/hpf (ref 0–5)
SPECIFIC GRAVITY, URINE: 1.01 (ref 1.005–1.030)
Squamous Epithelial / LPF: NONE SEEN
WBC, UA: NONE SEEN WBC/hpf (ref 0–5)

## 2017-06-14 LAB — COMPREHENSIVE METABOLIC PANEL
ALK PHOS: 64 U/L (ref 38–126)
ALT: 25 U/L (ref 17–63)
AST: 27 U/L (ref 15–41)
Albumin: 3.8 g/dL (ref 3.5–5.0)
Anion gap: 10 (ref 5–15)
BILIRUBIN TOTAL: 1 mg/dL (ref 0.3–1.2)
BUN: 24 mg/dL — AB (ref 6–20)
CALCIUM: 9.4 mg/dL (ref 8.9–10.3)
CHLORIDE: 103 mmol/L (ref 101–111)
CO2: 23 mmol/L (ref 22–32)
CREATININE: 1.31 mg/dL — AB (ref 0.61–1.24)
GFR calc Af Amer: 56 mL/min — ABNORMAL LOW (ref >60)
GFR, EST NON AFRICAN AMERICAN: 48 mL/min — AB (ref >60)
Glucose, Bld: 242 mg/dL — ABNORMAL HIGH (ref 65–99)
Potassium: 3.9 mmol/L (ref 3.5–5.1)
Sodium: 136 mmol/L (ref 135–145)
Total Protein: 7.2 g/dL (ref 6.5–8.1)

## 2017-06-14 LAB — CBC
HCT: 31.8 % — ABNORMAL LOW (ref 40.0–52.0)
Hemoglobin: 10.8 g/dL — ABNORMAL LOW (ref 13.0–18.0)
MCH: 30.4 pg (ref 26.0–34.0)
MCHC: 33.8 g/dL (ref 32.0–36.0)
MCV: 89.7 fL (ref 80.0–100.0)
PLATELETS: 234 10*3/uL (ref 150–440)
RBC: 3.54 MIL/uL — ABNORMAL LOW (ref 4.40–5.90)
RDW: 13.8 % (ref 11.5–14.5)
WBC: 12.3 10*3/uL — ABNORMAL HIGH (ref 3.8–10.6)

## 2017-06-14 LAB — TYPE AND SCREEN
ABO/RH(D): A POS
Antibody Screen: NEGATIVE

## 2017-06-14 MED ORDER — SODIUM CHLORIDE 0.9 % IV BOLUS (SEPSIS)
500.0000 mL | Freq: Once | INTRAVENOUS | Status: AC
  Administered 2017-06-14: 500 mL via INTRAVENOUS

## 2017-06-14 MED ORDER — PANTOPRAZOLE SODIUM 40 MG IV SOLR
40.0000 mg | Freq: Once | INTRAVENOUS | Status: AC
  Administered 2017-06-14: 40 mg via INTRAVENOUS
  Filled 2017-06-14: qty 40

## 2017-06-14 MED ORDER — SODIUM CHLORIDE 0.9 % IV SOLN
INTRAVENOUS | Status: AC
  Administered 2017-06-14: 21:00:00 via INTRAVENOUS

## 2017-06-14 MED ORDER — VITAMIN C 500 MG PO TABS
500.0000 mg | ORAL_TABLET | Freq: Every day | ORAL | Status: DC
  Administered 2017-06-15 – 2017-06-16 (×2): 500 mg via ORAL
  Filled 2017-06-14 (×3): qty 1

## 2017-06-14 MED ORDER — PANTOPRAZOLE SODIUM 40 MG IV SOLR
40.0000 mg | Freq: Two times a day (BID) | INTRAVENOUS | Status: DC
  Administered 2017-06-14 – 2017-06-16 (×4): 40 mg via INTRAVENOUS
  Filled 2017-06-14 (×4): qty 40

## 2017-06-14 MED ORDER — ONDANSETRON HCL 4 MG/2ML IJ SOLN: 4.0000 mg | Freq: Four times a day (QID) | INTRAMUSCULAR | Status: DC | PRN

## 2017-06-14 MED ORDER — TRAMADOL HCL 50 MG PO TABS: 50.0000 mg | ORAL_TABLET | Freq: Four times a day (QID) | ORAL | Status: DC | PRN

## 2017-06-14 MED ORDER — LEVOTHYROXINE SODIUM 25 MCG PO TABS
75.0000 ug | ORAL_TABLET | Freq: Every day | ORAL | Status: DC
  Administered 2017-06-15: 75 ug via ORAL
  Filled 2017-06-14: qty 1

## 2017-06-14 MED ORDER — ONDANSETRON HCL 4 MG PO TABS: 4.0000 mg | ORAL_TABLET | Freq: Four times a day (QID) | ORAL | Status: DC | PRN

## 2017-06-14 MED ORDER — DIPHENHYDRAMINE HCL 25 MG PO CAPS: 25.0000 mg | ORAL_CAPSULE | Freq: Every evening | ORAL | Status: DC | PRN

## 2017-06-14 MED ORDER — VITAMIN B-6 100 MG PO TABS
100.0000 mg | ORAL_TABLET | Freq: Every day | ORAL | Status: DC
  Administered 2017-06-15 – 2017-06-16 (×2): 100 mg via ORAL
  Filled 2017-06-14 (×3): qty 1

## 2017-06-14 MED ORDER — PRAVASTATIN SODIUM 20 MG PO TABS
80.0000 mg | ORAL_TABLET | Freq: Every day | ORAL | Status: DC
  Administered 2017-06-14 – 2017-06-15 (×2): 80 mg via ORAL
  Filled 2017-06-14 (×2): qty 4

## 2017-06-14 MED ORDER — ACETAMINOPHEN 650 MG RE SUPP: 650.0000 mg | Freq: Four times a day (QID) | RECTAL | Status: DC | PRN

## 2017-06-14 MED ORDER — ACETAMINOPHEN 325 MG PO TABS: 650.0000 mg | ORAL_TABLET | Freq: Four times a day (QID) | ORAL | Status: DC | PRN

## 2017-06-14 MED ORDER — ADULT MULTIVITAMIN W/MINERALS CH
1.0000 | ORAL_TABLET | Freq: Every day | ORAL | Status: DC
  Administered 2017-06-15 – 2017-06-16 (×2): 1 via ORAL
  Filled 2017-06-14 (×2): qty 1

## 2017-06-14 NOTE — H&P (Signed)
[place] [facility] [place] NAME: Aaron Becker    MR#:  [id number]  DATE OF BIRTH:  10-Aug-1930  DATE OF ADMISSION:  06/14/2017  PRIMARY CARE PHYSICIAN: Tonia Ghent, MD   REQUESTING/REFERRING PHYSICIAN: Quentin Cornwall  CHIEF COMPLAINT:  Hematochezia  HISTORY OF PRESENT ILLNESS:  Aaron Becker  is a 83 y.o. male with a known history of Recent diagnosis of created esophageal ulcer, gastric ulcer, colon polyps on EGD and flexible sigmoidoscopy during the recent admission on 06/09/2017. Patient also has history of diverticular hypertension hyperlipidemia and other medical problems. Patient was discharged home with by mouth Protonix following that he has noticed a black tarry stool which was cleared but since yesterday he has been noticing stool mixed with blood. Today a.m. he had a large bowel movement mixed with blood and became dizzy and pale. Patient is brought into the ED. ED physician has discussed with on-call gastroenterologist who has recommended to start the patient on Protonix and admit him to the hospital. Initial hemoglobin is at 10.6. Patient denies any abdominal pain. Denies any nausea vomiting. Wife and daughter are at bedside  PAST MEDICAL HISTORY:   Past Medical History:  Diagnosis Date  . Arthritis    hands  . Bradycardia   . Colon polyp   . Diabetes mellitus, type II ([place]) 1986   oral meds  . Elevated PSA 2011   per Dr. Jacqlyn Larsen with Uro  . [facility] (hard of hearing)    bilateral AIDES  . Hyperlipidemia   . Hypertension 02/02   controlled on meds  . Hypothyroidism 1980's  . Palpitations   . Right rib fracture   . Sleep apnea   . SVT (supraventricular tachycardia) ([place])    a. reported SVT during admission at [facility] 06/2014  . Syncope and collapse   . Wears dentures    upper and lower    PAST SURGICAL HISTOIRY:   Past Surgical History:  Procedure Laterality Date  . [place], 09/2015   COMPRESSED FRACTURE SPINE  .  carotid ultrasound  02/02   wnl  . CATARACT EXTRACTION W/PHACO Right 03/19/2016   Procedure: CATARACT EXTRACTION PHACO AND INTRAOCULAR LENS PLACEMENT (IOC);  Surgeon: Ronnell Freshwater, MD;  Location: [place];  Service: Ophthalmology;  Laterality: Right;  DIABETIC-oral med RIGHT  . CATARACT EXTRACTION W/PHACO Left 04/23/2016   Procedure: CATARACT EXTRACTION PHACO AND INTRAOCULAR LENS PLACEMENT (IOC);  Surgeon: Ronnell Freshwater, MD;  Location: [place];  Service: Ophthalmology;  Laterality: Left;  LEFT DIABETIC - oral meds  . CHOLECYSTECTOMY  1967  . COLONOSCOPY W/ POLYPECTOMY  2012   Villous adenoma from the rectum without high-grade dysplasia, 10 mm  . COLONOSCOPY WITH PROPOFOL N/A 03/30/2015   Procedure: COLONOSCOPY WITH PROPOFOL;  Surgeon: Robert Bellow, MD;  Location: [facility] ENDOSCOPY;  Service: Endoscopy;  Laterality: N/A;  . CYST EXCISION     thumb  . CYSTECTOMY  06/14/04   mucous cyst excision with left thumb, IP joint debridement  . ESOPHAGOGASTRODUODENOSCOPY (EGD) WITH PROPOFOL N/A 06/11/2017   Procedure: ESOPHAGOGASTRODUODENOSCOPY (EGD) WITH PROPOFOL;  Surgeon: Lucilla Lame, MD;  Location: [facility] ENDOSCOPY;  Service: Endoscopy;  Laterality: N/A;  . FLEXIBLE SIGMOIDOSCOPY N/A 06/11/2017   Procedure: FLEXIBLE SIGMOIDOSCOPY;  Surgeon: Lucilla Lame, MD;  Location: [facility] ENDOSCOPY;  Service: Endoscopy;  Laterality: N/A;  . HERNIA REPAIR  2013  . KYPHOPLASTY N/A 10/06/2015   Procedure: Thoracic twelve kyphoplasty;  Surgeon: Karie Chimera, MD;  Location: [place] NEURO ORS;  Service: Neurosurgery;  Laterality: N/A;  T12 Kyphoplasty  . PROSTATE BIOPSY  11/01/08   Dr. Reece Agar  . [place]  . VASECTOMY  1975    SOCIAL HISTORY:   Social History  Substance Use Topics  . Smoking status: Never Smoker  . Smokeless tobacco: Never Used  . Alcohol use No    FAMILY HISTORY:   Family History  Problem Relation Age of Onset  . Cancer Father         liver  . Cancer Sister        breast  . Cancer Brother        skin  . Cancer Sister        breast  . Heart disease Sister        pacer  . Colon cancer Neg Hx   . Prostate cancer Neg Hx     DRUG ALLERGIES:   Allergies  Allergen Reactions  . Flomax [Tamsulosin Hcl] Other (See Comments)    syncope  . Ibuprofen Other (See Comments)    Oral blisters at high doses  . Rosuvastatin     REACTION: muscle stiffness  . Sulfonamide Derivatives Rash    REVIEW OF SYSTEMS:  CONSTITUTIONAL: No fever, fatigue or weakness.  EYES: No blurred or double vision.  EARS, NOSE, AND THROAT: No tinnitus or ear pain.  RESPIRATORY: No cough, shortness of breath, wheezing or hemoptysis.  CARDIOVASCULAR: No chest pain, orthopnea, edema.  GASTROINTESTINAL:Patient is reporting stool mixed with blood No nausea, vomiting, diarrhea or abdominal pain.  GENITOURINARY: No dysuria, hematuria.  ENDOCRINE: No polyuria, nocturia,  HEMATOLOGY: No anemia, easy bruising or bleeding SKIN: No rash or lesion. MUSCULOSKELETAL: No joint pain or arthritis.   NEUROLOGIC: No tingling, numbness, weakness.  PSYCHIATRY: No anxiety or depression.   MEDICATIONS AT HOME:   Prior to Admission medications   Medication Sig Start Date End Date Taking? Authorizing Provider  diphenhydrAMINE (BENADRYL) 25 mg capsule Take 25 mg by mouth at bedtime as needed for allergies. PM   Yes [provider]  Docusate Calcium (STOOL SOFTENER PO) Take 1 tablet by mouth at bedtime as needed. For constipation   Yes [provider]  glimepiride (AMARYL) 1 MG tablet Take 2 tablets (2 mg total) by mouth daily with breakfast. 02/11/17  Yes Tonia Ghent, MD  glucose blood (ONE TOUCH ULTRA TEST) test strip TO TEST BLOOD SUGAR ONCE DAILY AND AS DIRECTED DX E11.9 01/15/17  Yes Tonia Ghent, MD  levothyroxine (SYNTHROID, LEVOTHROID) 75 MCG tablet Take 1 tablet (75 mcg total) by mouth daily. 02/11/17  Yes Tonia Ghent, MD   metFORMIN (GLUCOPHAGE) 500 MG tablet TAKE 1 TABLET BY MOUTH EVERY MORNING, 2 TABLETS AT SUPPER , THEN 1 EXTRA TAB AT NIGHT IF NEEDED Patient taking differently: Take 500-1,000 mg by mouth 2 (two) times daily with a meal. 500 mg every morning and 1000 mg daily with supper (may take 1 extra tablet at night if needed) 02/11/17  Yes Tonia Ghent, MD  Multiple Vitamins-Minerals (CENTRUM SILVER PO) Take 1 tablet by mouth daily.    Yes [provider]  pantoprazole (PROTONIX) 40 MG tablet Take 1 tablet (40 mg total) by mouth 2 (two) times daily. 06/11/17 06/11/18 Yes Abimelec Grochowski, Illene Silver, MD  pravastatin (PRAVACHOL) 80 MG tablet Take 1 tablet (80 mg total) by mouth daily. Patient taking differently: Take 80 mg by mouth at bedtime.  02/11/17  Yes Tonia Ghent, MD  pyridOXINE (VITAMIN B-6) 100 MG tablet Take 100 mg by mouth daily.    Yes [provider]  vitamin C (ASCORBIC ACID) 500 MG tablet Take 500 mg by mouth daily.    Yes [provider]      VITAL SIGNS:  Blood pressure 130/71, pulse 80, temperature 99.2 F (37.3 C), temperature source Oral, resp. rate (!) 26, height 5\' 2"  (1.575 m), weight 66.2 kg (146 lb), SpO2 95 %.  PHYSICAL EXAMINATION:  GENERAL:  83 y.o.-year-old patient lying in the bed with no acute distress.  EYES: Pupils equal, round, reactive to light and accommodation. No scleral icterus. Extraocular muscles intact.  HEENT: Head atraumatic, normocephalic. Oropharynx and nasopharynx clear.  NECK:  Supple, no jugular venous distention. No thyroid enlargement, no tenderness.  LUNGS: Normal breath sounds bilaterally, no wheezing, rales,rhonchi or crepitation. No use of accessory muscles of respiration.  CARDIOVASCULAR: S1, S2 normal. No murmurs, rubs, or gallops.  ABDOMEN: Soft, nontender, nondistended. Bowel sounds present. No organomegaly or mass.  EXTREMITIES: No pedal edema, cyanosis, or clubbing.  NEUROLOGIC: Cranial nerves II through XII are intact. Muscle  strength 5/5 in all extremities. Sensation intact. Gait not checked.  PSYCHIATRIC: The patient is alert and oriented x 3.  SKIN: No obvious rash, lesion, or ulcer.   LABORATORY PANEL:   CBC  Recent Labs Lab 06/14/17 1440  WBC 12.3*  HGB 10.8*  HCT 31.8*  PLT 234   ------------------------------------------------------------------------------------------------------------------  Chemistries   Recent Labs Lab 06/14/17 1440  NA 136  K 3.9  CL 103  CO2 23  GLUCOSE 242*  BUN 24*  CREATININE 1.31*  CALCIUM 9.4  AST 27  ALT 25  ALKPHOS 64  BILITOT 1.0   ------------------------------------------------------------------------------------------------------------------  Cardiac Enzymes No results for input(s): TROPONINI in the last 168 hours. ------------------------------------------------------------------------------------------------------------------  RADIOLOGY:  Dg Chest Portable 1 View  Result Date: 06/14/2017 CLINICAL DATA:  Epigastric pain, bradycardia, GI bleed EXAM: PORTABLE CHEST 1 VIEW COMPARISON:  07/15/2014 FINDINGS: Normal heart size and vascularity. Lungs remain clear. No focal pneumonia, collapse or consolidation. Negative for edema, effusion or pneumothorax. Degenerative changes of the spine. Old healed left fourth rib fracture noted. Atherosclerosis of the aorta. No gross free air evident. IMPRESSION: No acute finding by plain radiography.  Stable exam. Electronically Signed   By: Jerilynn Mages.  Shick M.D.   On: 06/14/2017 17:29    EKG:   Orders placed or performed during the hospital encounter of 06/14/17  . EKG 12-Lead  . EKG 12-Lead  . ED EKG  . ED EKG    IMPRESSION AND PLAN:   Brennen Meneely  is a 83 y.o. male with a known history of Recent diagnosis of created esophageal ulcer, gastric ulcer, colon polyps on EGD and flexible sigmoidoscopy during the recent admission on 06/09/2017. Patient also has history of diverticular hypertension hyperlipidemia and  other medical problems. Patient was discharged home with by mouth Protonix following that he has noticed a black tarry stool which was cleared but since yesterday he has been noticing stool mixed with blood. Today a.m. he had a large bowel movement mixed with blood and became dizzy and pale  #Hematochezia with recent history of created esophageal ulcer and gastric ulcer Admit to MedSurg unit Clear liquid diet and monitor hemoglobin and hematocrit closely PPI Protonix 40 mg IV every 12 hours,, gentle hydration with IV fluids If patient starts profusely bleeding will order bleeding scan GI consult is placed Currently patient is hemodynamically stable  #Diabetes mellitus Hold by mouth  medications Amaryl and Glucophage Clear liquid diet, sliding scale insulin  #Hyperlipidemia Continue home medication protocol  #Hypothyroidism continue Synthroid  DVT prophylaxis with SCDs and GI prophylaxis with Protonix    All the records are reviewed and case discussed with ED provider. Management plans discussed with the patient, family and they are in agreement.  CODE STATUS: fc, wife HCPOA  TOTAL TIME TAKING CARE OF THIS PATIENT: 45  minutes.   Note: This dictation was prepared with Dragon dictation along with smaller phrase technology. Any transcriptional errors that result from this process are unintentional.  Nicholes Mango M.D on 06/14/2017 at 6:24 PM  Between 7am to 6pm - Pager - [phone]  After 6pm go to [email or web address] - password [email or web address] [place] [facility]  Office  [phone]  CC: Primary care physician; Tonia Ghent, MD

## 2017-06-14 NOTE — ED Provider Notes (Signed)
[facility] Emergency Department Provider Note    First MD Initiated Contact with Patient 06/14/17 1522     (approximate)  I have reviewed the triage vital signs and the nursing notes.   HISTORY  Chief Complaint GI Bleeding and Dizziness    HPI Aaron Becker is a 83 y.o. male who was just recently hospitalized for GI bleeding status post endoscopy and sigmoidoscopy thatshowed evidence of peptic ulcerative disease with recent hemorrhage. He is discharged on Protonix. He has not been taking any aspirin or blood thinners. States that today started feeling upset stomach and did have one episode of bright red blood per rectum that was in the commode associated with dizziness and lightheadedness. Has only had 1 episode. Does not have any pain at this time. No nausea or vomiting.   Past Medical History:  Diagnosis Date  . Arthritis    hands  . Bradycardia   . Colon polyp   . Diabetes mellitus, type II ([place]) 1986   oral meds  . Elevated PSA 2011   per Dr. Jacqlyn Larsen with Uro  . [facility] (hard of hearing)    bilateral AIDES  . Hyperlipidemia   . Hypertension 02/02   controlled on meds  . Hypothyroidism 1980's  . Palpitations   . Right rib fracture   . Sleep apnea   . SVT (supraventricular tachycardia) ([place])    a. reported SVT during admission at [facility] 06/2014  . Syncope and collapse   . Wears dentures    upper and lower   Family History  Problem Relation Age of Onset  . Cancer Father        liver  . Cancer Sister        breast  . Cancer Brother        skin  . Cancer Sister        breast  . Heart disease Sister        pacer  . Colon cancer Neg Hx   . Prostate cancer Neg Hx    Past Surgical History:  Procedure Laterality Date  . [place], 09/2015   COMPRESSED FRACTURE SPINE  . carotid ultrasound  02/02   wnl  . CATARACT EXTRACTION W/PHACO Right 03/19/2016   Procedure: CATARACT EXTRACTION PHACO AND INTRAOCULAR LENS PLACEMENT (IOC);   Surgeon: Ronnell Freshwater, MD;  Location: [place];  Service: Ophthalmology;  Laterality: Right;  DIABETIC-oral med RIGHT  . CATARACT EXTRACTION W/PHACO Left 04/23/2016   Procedure: CATARACT EXTRACTION PHACO AND INTRAOCULAR LENS PLACEMENT (IOC);  Surgeon: Ronnell Freshwater, MD;  Location: [place];  Service: Ophthalmology;  Laterality: Left;  LEFT DIABETIC - oral meds  . CHOLECYSTECTOMY  1967  . COLONOSCOPY W/ POLYPECTOMY  2012   Villous adenoma from the rectum without high-grade dysplasia, 10 mm  . COLONOSCOPY WITH PROPOFOL N/A 03/30/2015   Procedure: COLONOSCOPY WITH PROPOFOL;  Surgeon: Robert Bellow, MD;  Location: [facility] ENDOSCOPY;  Service: Endoscopy;  Laterality: N/A;  . CYST EXCISION     thumb  . CYSTECTOMY  06/14/04   mucous cyst excision with left thumb, IP joint debridement  . ESOPHAGOGASTRODUODENOSCOPY (EGD) WITH PROPOFOL N/A 06/11/2017   Procedure: ESOPHAGOGASTRODUODENOSCOPY (EGD) WITH PROPOFOL;  Surgeon: Lucilla Lame, MD;  Location[facility] ENDOSCOPY;  Service: Endoscopy;  Laterality: N/A;  . FLEXIBLE SIGMOIDOSCOPY N/A 06/11/2017   Procedure: FLEXIBLE SIGMOIDOSCOPY;  Surgeon: Lucilla Lame, MD;  Location: [facility] ENDOSCOPY;  Service: Endoscopy;  Laterality: N/A;  . HERNIA REPAIR  2013  . KYPHOPLASTY N/A 10/06/2015   Procedure: Thoracic twelve kyphoplasty;  Surgeon: Karie Chimera, MD;  Location: [place] NEURO ORS;  Service: Neurosurgery;  Laterality: N/A;  T12 Kyphoplasty  . PROSTATE BIOPSY  11/01/08   Dr. Reece Agar  . [place]  . VASECTOMY  1975   Patient Active Problem List   Diagnosis Date Noted  . Rectal polyp   . Melena   . UGIB (upper gastrointestinal bleed)   . Hematochezia   . GI bleed 06/09/2017  . Advance care planning 02/11/2017  . T12 vertebral fracture ([place]) 08/22/2015  . Lower back pain 08/17/2015  . Villous adenoma polyp of rectum 04/06/2015  . History of colonic polyps 02/04/2015  . Bradycardia   . Palpitations    . SVT (supraventricular tachycardia) ([place]) 07/18/2014  . Syncope 07/14/2014  . Medicare annual wellness visit, subsequent 12/04/2012  . Sleep concern 06/08/2012  . Memory change 12/06/2011  . PSA elevation 11/21/2011  . Right inguinal hernia 06/06/2011  . Diverticulitis of colon 02/22/2011  . SHOULDER STRAIN, RIGHT 11/01/2010  . ARTHRITIS, HANDS, BILATERAL 09/01/2007  . Hypothyroidism 01/07/2007  . Diabetes mellitus without complication ([place]) 60/63/0160  . HYPERCHOLESTEROLEMIA/ 156/LDL 101  7/03 01/07/2007  . Essential hypertension 01/07/2007  . ARTHRITIS, ELBOWS AND HIPS 01/07/2007      Prior to Admission medications   Medication Sig Start Date End Date Taking? Authorizing Provider  diphenhydrAMINE (BENADRYL) 25 mg capsule Take 25 mg by mouth as needed. PM    [provider]  Docusate Calcium (STOOL SOFTENER PO) Take 1 tablet by mouth as needed. PM     [provider]  glimepiride (AMARYL) 1 MG tablet Take 2 tablets (2 mg total) by mouth daily with breakfast. 02/11/17   Tonia Ghent, MD  glucose blood (ONE TOUCH ULTRA TEST) test strip TO TEST BLOOD SUGAR ONCE DAILY AND AS DIRECTED DX E11.9 01/15/17   Tonia Ghent, MD  levothyroxine (SYNTHROID, LEVOTHROID) 75 MCG tablet Take 1 tablet (75 mcg total) by mouth daily. 02/11/17   Tonia Ghent, MD  metFORMIN (GLUCOPHAGE) 500 MG tablet TAKE 1 TABLET BY MOUTH EVERY MORNING, 2 TABLETS AT SUPPER , THEN 1 EXTRA TAB AT NIGHT IF NEEDED 02/11/17   Tonia Ghent, MD  Multiple Vitamins-Minerals (CENTRUM SILVER PO) Take by mouth daily. AM    [provider]  Omega-3 Fatty Acids (FISH OIL) 1000 MG CAPS Take by mouth daily. Reported on 03/13/2016    [provider]  pantoprazole (PROTONIX) 40 MG tablet Take 1 tablet (40 mg total) by mouth 2 (two) times daily. 06/11/17 06/11/18  Nicholes Mango, MD  pravastatin (PRAVACHOL) 80 MG tablet Take 1 tablet (80 mg total) by mouth daily. 02/11/17   Tonia Ghent, MD    pyridOXINE (VITAMIN B-6) 100 MG tablet Take 100 mg by mouth daily. AM    [provider]  vitamin C (ASCORBIC ACID) 500 MG tablet Take 500 mg by mouth daily. AM    [provider]    Allergies Flomax [tamsulosin hcl]; Ibuprofen; Rosuvastatin; and Sulfonamide derivatives    Social History Social History  Substance Use Topics  . Smoking status: Never Smoker  . Smokeless tobacco: Never Used  . Alcohol use No    Review of Systems Patient denies headaches, rhinorrhea, blurry vision, numbness, shortness of breath, chest pain, edema, cough, abdominal pain, nausea, vomiting, diarrhea, dysuria, fevers, rashes or hallucinations unless otherwise stated above in HPI. ____________________________________________   PHYSICAL EXAM:  VITAL SIGNS: Vitals:   06/14/17 1438 06/14/17 1547  BP: 131/62 136/67  Pulse: 95 83  Resp: 18 16  Temp: 99.2 F (37.3 C)   SpO2: 98% 99%    Constitutional: Alert and oriented. Well appearing and in no acute distress. Eyes: Conjunctivae are normal.  Head: Atraumatic. Nose: No congestion/rhinnorhea. Mouth/Throat: Mucous membranes are moist.   Neck: No stridor. Painless ROM.  Cardiovascular: Normal rate, regular rhythm. Grossly normal heart sounds.  Good peripheral circulation. Respiratory: Normal respiratory effort.  No retractions. Lungs CTAB. Gastrointestinal: Soft and nontender. No distention. No abdominal bruits. No CVA tenderness. Genitourinary:  Musculoskeletal: No lower extremity tenderness nor edema.  No joint effusions. Neurologic:  Normal speech and language. No gross focal neurologic deficits are appreciated. No facial droop Skin:  Skin is warm, dry and intact. No rash noted. Psychiatric: Mood and affect are normal. Speech and behavior are normal.  ____________________________________________   LABS (all labs ordered are listed, but only abnormal results are displayed)  Results for orders placed or performed during the  hospital encounter of 06/14/17 (from the past 24 hour(s))  CBC     Status: Abnormal   Collection Time: 06/14/17  2:40 PM  Result Value Ref Range   WBC 12.3 (H) 3.8 - 10.6 K/uL   RBC 3.54 (L) 4.40 - 5.90 MIL/uL   Hemoglobin 10.8 (L) 13.0 - 18.0 g/dL   HCT 31.8 (L) 40.0 - 52.0 %   MCV 89.7 80.0 - 100.0 fL   MCH 30.4 26.0 - 34.0 pg   MCHC 33.8 32.0 - 36.0 g/dL   RDW 13.8 11.5 - 14.5 %   Platelets 234 150 - 440 K/uL  Comprehensive metabolic panel     Status: Abnormal   Collection Time: 06/14/17  2:40 PM  Result Value Ref Range   Sodium 136 135 - 145 mmol/L   Potassium 3.9 3.5 - 5.1 mmol/L   Chloride 103 101 - 111 mmol/L   CO2 23 22 - 32 mmol/L   Glucose, Bld 242 (H) 65 - 99 mg/dL   BUN 24 (H) 6 - 20 mg/dL   Creatinine, Ser 1.31 (H) 0.61 - 1.24 mg/dL   Calcium 9.4 8.9 - 10.3 mg/dL   Total Protein 7.2 6.5 - 8.1 g/dL   Albumin 3.8 3.5 - 5.0 g/dL   AST 27 15 - 41 U/L   ALT 25 17 - 63 U/L   Alkaline Phosphatase 64 38 - 126 U/L   Total Bilirubin 1.0 0.3 - 1.2 mg/dL   GFR calc non Af Amer 48 (L) >60 mL/min   GFR calc Af Amer 56 (L) >60 mL/min   Anion gap 10 5 - 15  Type and screen [place]     Status: None   Collection Time: 06/14/17  2:40 PM  Result Value Ref Range   ABO/RH(D) A POS    Antibody Screen NEG    Sample Expiration 06/17/2017    ____________________________________________  EKG My review and personal interpretation at Time: 14:18   Indication: lightheadedness  Rate: 105  Rhythm: sinus Axis: normal Other: normal intervals, no stemi, no depressions ____________________________________________  RADIOLOGY  I personally reviewed all radiographic images ordered to evaluate for the above acute complaints and reviewed radiology reports and findings.  These findings were personally discussed with the patient.  Please see medical record for radiology report.  ____________________________________________   PROCEDURES  Procedure(s) performed:    Procedures    Critical Care performed: no ____________________________________________   INITIAL IMPRESSION /  ASSESSMENT AND PLAN / ED COURSE  Pertinent labs & imaging results that were available during my care of the patient were reviewed by me and considered in my medical decision making (see chart for details).  DDX: pud, gastritis, diverticulosis, mass, perf  Aaron Becker is a 83 y.o. who presents to the ED with episode of hematochezia and lightheadedness. Patient did have some borderline low blood pressures for him at home as he does have a history of hypertension. Currently well perfused. Blood work is reassuring and shows no significant drop in his hemoglobin. May be slightly deep dehydrated based on his BMP and elevated creatinine. We'll give IV fluids. Based on his history I spoke with Dr. Vicente Males of gastroenterology who is recommended observation for serial hemoglobins and hemodynamic monitoring. We'll start the patient on IV Protonix.  Have discussed with the patient and available family all diagnostics and treatments performed thus far and all questions were answered to the best of my ability. The patient demonstrates understanding and agreement with plan.       ____________________________________________   FINAL CLINICAL IMPRESSION(S) / ED DIAGNOSES  Final diagnoses:  Hematochezia      NEW MEDICATIONS STARTED DURING THIS VISIT:  New Prescriptions   No medications on file     Note:  This document was prepared using Dragon voice recognition software and may include unintentional dictation errors.    Merlyn Lot, MD 06/14/17 [phone]

## 2017-06-14 NOTE — ED Triage Notes (Signed)
Pt arrived via POV from home with wife, states he has been feeling dizzy since this morning and has been having bright red blood in the toilet after BM today. Pt here on Sunday for GI bleed and had EGD/Colon on Tuesday.  Bleeding found during EGD and colon oer family.   Questionable for possible CA.   Pt denies any abdominal pain and did not have pain last weekend when here with GI bleed, pt did c/o nausea today and feeling sick on his stomach.   Per wife, pt had low BP readings at home 119/54, 115/55 and 118/62.  Pt is off all BP meds currently and fluid pill per wife.  Pt is alert and oriented, skin is appropriate color for ethnicity.  Pt states he is feeling weak.

## 2017-06-14 NOTE — Telephone Encounter (Signed)
Patient Name: Aaron Becker DOB: 1929-12-01 Initial Comment Caller states that he her husband blood pressure is running low. Most recent was 115/55. He is very dizzy and very pale. He was recently taken off BP medications due to internal bleeding. Nurse Assessment Nurse: Kathi Ludwig, RN, Leana Roe Date/Time [name] Time): 06/14/2017 1:08:30 PM Confirm and document reason for call. If symptomatic, describe symptoms. ---Caller states that he her husband blood pressure is running low. Most recent was 115/55. He is very dizzy and very pale. He was recently taken off BP medications due to internal bleeding. Lying down with feet propped up. Was in hospital from 9/16-9/18 for bleeding. Had endoscopy and colonoscopy Does the patient have any new or worsening symptoms? ---Yes Will a triage be completed? ---Yes Related visit to physician within the last 2 weeks? ---Yes Does the PT have any chronic conditions? (i.e. diabetes, asthma, etc.) ---Yes List chronic conditions. ---diabetes, HTN Is this a behavioral health or substance abuse call? ---No Guidelines Guideline Title Affirmed Question Affirmed Notes Low Blood Pressure Shock suspected (e.g., cold/pale/clammy skin, too weak to stand, low BP, rapid pulse) Final Disposition User Call EMS 911 Now Kathi Ludwig, RN, Leana Roe Caller Disagree/Comply Comply Caller Understands Yes PreDisposition InappropriateToAsk

## 2017-06-14 NOTE — Telephone Encounter (Signed)
Agree with 911. Thanks 

## 2017-06-15 ENCOUNTER — Encounter: Payer: Self-pay | Admitting: *Deleted

## 2017-06-15 DIAGNOSIS — K921 Melena: Secondary | ICD-10-CM

## 2017-06-15 LAB — CBC
HCT: 29.6 % — ABNORMAL LOW (ref 40.0–52.0)
Hemoglobin: 10 g/dL — ABNORMAL LOW (ref 13.0–18.0)
MCH: 30.3 pg (ref 26.0–34.0)
MCHC: 34 g/dL (ref 32.0–36.0)
MCV: 89.3 fL (ref 80.0–100.0)
PLATELETS: 202 10*3/uL (ref 150–440)
RBC: 3.31 MIL/uL — AB (ref 4.40–5.90)
RDW: 13.7 % (ref 11.5–14.5)
WBC: 6.3 10*3/uL (ref 3.8–10.6)

## 2017-06-15 LAB — BASIC METABOLIC PANEL
ANION GAP: 6 (ref 5–15)
BUN: 14 mg/dL (ref 6–20)
CO2: 25 mmol/L (ref 22–32)
CREATININE: 0.89 mg/dL (ref 0.61–1.24)
Calcium: 8.6 mg/dL — ABNORMAL LOW (ref 8.9–10.3)
Chloride: 109 mmol/L (ref 101–111)
GFR calc non Af Amer: >60 mL/min (ref >60)
GLUCOSE: 118 mg/dL — AB (ref 65–99)
Potassium: 3.3 mmol/L — ABNORMAL LOW (ref 3.5–5.1)
SODIUM: 140 mmol/L (ref 135–145)

## 2017-06-15 LAB — GLUCOSE, CAPILLARY
GLUCOSE-CAPILLARY: 114 mg/dL — AB (ref 65–99)
GLUCOSE-CAPILLARY: 117 mg/dL — AB (ref 65–99)
GLUCOSE-CAPILLARY: 214 mg/dL — AB (ref 65–99)
Glucose-Capillary: 159 mg/dL — ABNORMAL HIGH (ref 65–99)
Glucose-Capillary: 240 mg/dL — ABNORMAL HIGH (ref 65–99)

## 2017-06-15 LAB — HEMOGLOBIN AND HEMATOCRIT, BLOOD
HEMATOCRIT: 29.7 % — AB (ref 40.0–52.0)
HEMOGLOBIN: 10.2 g/dL — AB (ref 13.0–18.0)

## 2017-06-15 MED ORDER — POTASSIUM CHLORIDE CRYS ER 20 MEQ PO TBCR
40.0000 meq | EXTENDED_RELEASE_TABLET | Freq: Once | ORAL | Status: AC
  Administered 2017-06-15: 40 meq via ORAL
  Filled 2017-06-15: qty 2

## 2017-06-15 MED ORDER — INSULIN ASPART 100 UNIT/ML ~~LOC~~ SOLN
0.0000 [IU] | Freq: Three times a day (TID) | SUBCUTANEOUS | Status: DC
  Administered 2017-06-16: 2 [IU] via SUBCUTANEOUS
  Filled 2017-06-15 (×2): qty 1

## 2017-06-15 MED ORDER — INSULIN ASPART 100 UNIT/ML ~~LOC~~ SOLN
0.0000 [IU] | Freq: Every day | SUBCUTANEOUS | Status: DC
  Administered 2017-06-15: 2 [IU] via SUBCUTANEOUS
  Filled 2017-06-15: qty 1

## 2017-06-15 NOTE — Progress Notes (Signed)
[facility] NAME: Aaron Becker    MR#:  [id number]  DATE OF BIRTH:  11/04/1929  SUBJECTIVE:   Patient has had no further bleeding since admission.  REVIEW OF SYSTEMS:    Review of Systems  Constitutional: Negative for fever, chills weight loss HENT: Negative for ear pain, nosebleeds, congestion, facial swelling, rhinorrhea, neck pain, neck stiffness and ear discharge.   Respiratory: Negative for cough, shortness of breath, wheezing  Cardiovascular: Negative for chest pain, palpitations and leg swelling.  Gastrointestinal: Negative for heartburn, abdominal pain, vomiting, diarrhea or consitpation Genitourinary: Negative for dysuria, urgency, frequency, hematuria Musculoskeletal: Negative for back pain or joint pain Neurological: Negative for dizziness, seizures, syncope, focal weakness,  numbness and headaches.  Hematological: Does not bruise/bleed easily.  Psychiatric/Behavioral: Negative for hallucinations, confusion, dysphoric mood    Tolerating Diet: yes      DRUG ALLERGIES:   Allergies  Allergen Reactions  . Flomax [Tamsulosin Hcl] Other (See Comments)    syncope  . Ibuprofen Other (See Comments)    Oral blisters at high doses  . Rosuvastatin     REACTION: muscle stiffness  . Sulfonamide Derivatives Rash    VITALS:  Blood pressure (!) 146/69, pulse 66, temperature 98.3 F (36.8 C), temperature source Oral, resp. rate 18, height 5\' 2"  (1.575 m), weight 66.2 kg (146 lb), SpO2 96 %.  PHYSICAL EXAMINATION:  Constitutional: Appears well-developed and well-nourished. No distress. HENT: Normocephalic. Marland Kitchen Oropharynx is clear and moist.  Eyes: Conjunctivae and EOM are normal. PERRLA, no scleral icterus.  Neck: Normal ROM. Neck supple. No JVD. No tracheal deviation. CVS: RRR, S1/S2 +, no murmurs, no gallops, no carotid bruit.  Pulmonary: Effort and breath sounds normal, no stridor, rhonchi, wheezes, rales.  Abdominal:  Soft. BS +,  no distension, tenderness, rebound or guarding.  Musculoskeletal: Normal range of motion. No edema and no tenderness.  Neuro: Alert. CN 2-12 grossly intact. No focal deficits. Skin: Skin is warm and dry. No rash noted. Psychiatric: Normal mood and affect.      LABORATORY PANEL:   CBC  Recent Labs Lab 06/15/17 0528  WBC 6.3  HGB 10.0*  HCT 29.6*  PLT 202   ------------------------------------------------------------------------------------------------------------------  Chemistries   Recent Labs Lab 06/14/17 1440 06/15/17 0528  NA 136 140  K 3.9 3.3*  CL 103 109  CO2 23 25  GLUCOSE 242* 118*  BUN 24* 14  CREATININE 1.31* 0.89  CALCIUM 9.4 8.6*  AST 27  --   ALT 25  --   ALKPHOS 64  --   BILITOT 1.0  --    ------------------------------------------------------------------------------------------------------------------  Cardiac Enzymes No results for input(s): TROPONINI in the last 168 hours. ------------------------------------------------------------------------------------------------------------------  RADIOLOGY:  Dg Chest Portable 1 View  Result Date: 06/14/2017 CLINICAL DATA:  Epigastric pain, bradycardia, GI bleed EXAM: PORTABLE CHEST 1 VIEW COMPARISON:  07/15/2014 FINDINGS: Normal heart size and vascularity. Lungs remain clear. No focal pneumonia, collapse or consolidation. Negative for edema, effusion or pneumothorax. Degenerative changes of the spine. Old healed left fourth rib fracture noted. Atherosclerosis of the aorta. No gross free air evident. IMPRESSION: No acute finding by plain radiography.  Stable exam. Electronically Signed   By: Jerilynn Mages.  Shick M.D.   On: 06/14/2017 17:29     ASSESSMENT AND PLAN:   83 year old male who was recently discharged due to GI bleed from esophageal and gastric ulcer who presents with hematochezia.  1. Hematochezia: Discussed case with Dr. Vicente Males Possible colonoscopy in  a.m. Hemoglobin remained  stable.  2. Recent esophageal and gastric ulcer diagnosis by EGD:  Continue PPI Hemoglobin has remained stable  3. Diabetes: Continue sliding scale Holding oral medications for now  4. Hypothyroidism: Continue Synthroid  5. Hyperlipidemia: Continue pravastatin  D/w dr Vicente Males  Management plans discussed with the patient and family and they are in agreement.  CODE STATUS: full  TOTAL TIME TAKING CARE OF THIS PATIENT: 30 minutes.     POSSIBLE D/C 1-2 days, DEPENDING ON CLINICAL CONDITION.   Zahriah Roes M.D on 06/15/2017 at 10:04 AM  Between 7am to 6pm - Pager - [phone] After 6pm go to [email or web address] - password [email or web address]  Office  [phone]  CC: Primary care physician; Tonia Ghent, MD  Note: This dictation was prepared with Dragon dictation along with smaller phrase technology. Any transcriptional errors that result from this process are unintentional.

## 2017-06-15 NOTE — Progress Notes (Signed)
[name] MD, MRCP(U.K) [address]  [place], [place] [address]  Main: [phone]  Fax: [phone]  Consultation  Referring Provider:   Dr Benjie Karvonen Primary Care Physician:  Tonia Ghent, MD Primary Gastroenterologist: None         Reason for Consultation:     Rectal bleeding   Date of Admission:  06/14/2017 Date of Consultation:  06/15/2017         HPI:   Aaron Becker is a 83 y.o. male   He was recently admitted on 06/09/17 with a GI bleed and Dr Allen Norris performed an EGD where he found an esophageal ulcer and a gastric ulcer, neither of which required any therapy. He also performed a flexible sigmoidoscopy which found no blood. He was discharged on PPI. He was subsequently discharged and re admitted yesterday and readmitted with an episode of bright red blood per rectum with dizziness .  The patient is a poor historian. I spoke to him with his family in the room. He was not clearly describing a bloody bowel movement , he said he probably saw very little blood in the toilet bowl, not recurred since. No bloody bowel movements since admission (presently 425 pm ), no abdominal pain , no hematemesis , HB not repeated since admission.    Component     Latest Ref Rng & Units 06/10/2017 06/11/2017 06/15/2017  WBC     3.8 - 10.6 K/uL 6.3 7.1 6.3  RBC     4.40 - 5.90 MIL/uL 3.37 (L) 3.60 (L) 3.31 (L)  Hemoglobin     13.0 - 18.0 g/dL 10.7 (L) 11.2 (L) 10.0 (L)  HCT     40.0 - 52.0 % 30.8 (L) 32.6 (L) 29.6 (L)  MCV     80.0 - 100.0 fL 91.2 90.6 89.3  MCH     26.0 - 34.0 pg 31.8 31.0 30.3  MCHC     32.0 - 36.0 g/dL 34.9 34.2 34.0  RDW     11.5 - 14.5 % 13.8 13.7 13.7  Platelets     150 - 440 K/uL 172 181 202      Past Medical History:  Diagnosis Date  . Arthritis    hands  . Bradycardia   . Colon polyp   . Diabetes mellitus, type II ([place]) 1986   oral meds  . Elevated PSA 2011   per Dr. Jacqlyn Larsen with Uro  . [facility] (hard of hearing)    bilateral AIDES  .  Hyperlipidemia   . Hypertension 02/02   controlled on meds  . Hypothyroidism 1980's  . Palpitations   . Right rib fracture   . Sleep apnea   . SVT (supraventricular tachycardia) ([place])    a. reported SVT during admission at [facility] 06/2014  . Syncope and collapse   . Wears dentures    upper and lower    Past Surgical History:  Procedure Laterality Date  . [place], 09/2015   COMPRESSED FRACTURE SPINE  . carotid ultrasound  02/02   wnl  . CATARACT EXTRACTION W/PHACO Right 03/19/2016   Procedure: CATARACT EXTRACTION PHACO AND INTRAOCULAR LENS PLACEMENT (IOC);  Surgeon: Ronnell Freshwater, MD;  Location: [facility];  Service: Ophthalmology;  Laterality: Right;  DIABETIC-oral med RIGHT  . CATARACT EXTRACTION W/PHACO Left 04/23/2016   Procedure: CATARACT EXTRACTION PHACO AND INTRAOCULAR LENS PLACEMENT (IOC);  Surgeon: Ronnell Freshwater, MD;  Location: [facility];  Service: Ophthalmology;  Laterality:  Left;  LEFT DIABETIC - oral meds  . CHOLECYSTECTOMY  1967  . COLONOSCOPY W/ POLYPECTOMY  2012   Villous adenoma from the rectum without high-grade dysplasia, 10 mm  . COLONOSCOPY WITH PROPOFOL N/A 03/30/2015   Procedure: COLONOSCOPY WITH PROPOFOL;  Surgeon: Robert Bellow, MD;  Location: [facility] ENDOSCOPY;  Service: Endoscopy;  Laterality: N/A;  . CYST EXCISION     thumb  . CYSTECTOMY  06/14/04   mucous cyst excision with left thumb, IP joint debridement  . ESOPHAGOGASTRODUODENOSCOPY (EGD) WITH PROPOFOL N/A 06/11/2017   Procedure: ESOPHAGOGASTRODUODENOSCOPY (EGD) WITH PROPOFOL;  Surgeon: Lucilla Lame, MD;  Location: [facility] ENDOSCOPY;  Service: Endoscopy;  Laterality: N/A;  . FLEXIBLE SIGMOIDOSCOPY N/A 06/11/2017   Procedure: FLEXIBLE SIGMOIDOSCOPY;  Surgeon: Lucilla Lame, MD;  Location: [facility] ENDOSCOPY;  Service: Endoscopy;  Laterality: N/A;  . HERNIA REPAIR  2013  . KYPHOPLASTY N/A 10/06/2015   Procedure: Thoracic twelve kyphoplasty;  Surgeon: Karie Chimera, MD;  Location: [facility] NEURO ORS;  Service: Neurosurgery;  Laterality: N/A;  T12 Kyphoplasty  . PROSTATE BIOPSY  11/01/08   Dr. Reece Agar  . [place]  . VASECTOMY  1975    Prior to Admission medications   Medication Sig Start Date End Date Taking? Authorizing Provider  diphenhydrAMINE (BENADRYL) 25 mg capsule Take 25 mg by mouth at bedtime as needed for allergies. PM   Yes [provider]  Docusate Calcium (STOOL SOFTENER PO) Take 1 tablet by mouth at bedtime as needed. For constipation   Yes [provider]  glimepiride (AMARYL) 1 MG tablet Take 2 tablets (2 mg total) by mouth daily with breakfast. 02/11/17  Yes Tonia Ghent, MD  glucose blood (ONE TOUCH ULTRA TEST) test strip TO TEST BLOOD SUGAR ONCE DAILY AND AS DIRECTED DX E11.9 01/15/17  Yes Tonia Ghent, MD  levothyroxine (SYNTHROID, LEVOTHROID) 75 MCG tablet Take 1 tablet (75 mcg total) by mouth daily. 02/11/17  Yes Tonia Ghent, MD  metFORMIN (GLUCOPHAGE) 500 MG tablet TAKE 1 TABLET BY MOUTH EVERY MORNING, 2 TABLETS AT SUPPER , THEN 1 EXTRA TAB AT NIGHT IF NEEDED Patient taking differently: Take 500-1,000 mg by mouth 2 (two) times daily with a meal. 500 mg every morning and 1000 mg daily with supper (may take 1 extra tablet at night if needed) 02/11/17  Yes Tonia Ghent, MD  Multiple Vitamins-Minerals (CENTRUM SILVER PO) Take 1 tablet by mouth daily.    Yes [provider]  pantoprazole (PROTONIX) 40 MG tablet Take 1 tablet (40 mg total) by mouth 2 (two) times daily. 06/11/17 06/11/18 Yes Gouru, Illene Silver, MD  pravastatin (PRAVACHOL) 80 MG tablet Take 1 tablet (80 mg total) by mouth daily. Patient taking differently: Take 80 mg by mouth at bedtime.  02/11/17  Yes Tonia Ghent, MD  pyridOXINE (VITAMIN B-6) 100 MG tablet Take 100 mg by mouth daily.    Yes [provider]  vitamin C (ASCORBIC ACID) 500 MG tablet Take 500 mg by mouth daily.    Yes [provider]     Family History  Problem Relation Age of Onset  . Cancer Father        liver  . Cancer Sister        breast  . Cancer Brother        skin  . Cancer Sister        breast  . Heart disease Sister        pacer  . Colon cancer  Neg Hx   . Prostate cancer Neg Hx      Social History  Substance Use Topics  . Smoking status: Never Smoker  . Smokeless tobacco: Never Used  . Alcohol use No    Allergies as of 06/14/2017 - Review Complete 06/14/2017  Allergen Reaction Noted  . Flomax [tamsulosin hcl] Other (See Comments) 07/26/2014  . Ibuprofen Other (See Comments) 02/02/2015  . Rosuvastatin  01/23/2007  . Sulfonamide derivatives Rash 01/23/2007    Review of Systems:    All systems reviewed and negative except where noted in HPI.   Physical Exam:  Vital signs in last 24 hours: Temp:  [97.7 F (36.5 C)-98.6 F (37 C)] 98.3 F (36.8 C) (09/22 0748) Pulse Rate:  [66-80] 66 (09/22 0748) Resp:  [16-26] 18 (09/22 0748) BP: (130-149)/(65-71) 146/69 (09/22 0748) SpO2:  [95 %-100 %] 96 % (09/22 0748) Last BM Date: 06/14/17 General:   Pleasant, cooperative in NAD Head:  Normocephalic and atraumatic. Eyes:   No icterus.   Conjunctiva pink. PERRLA. Ears:  Normal auditory acuity. Neck:  Supple; no masses or thyroidomegaly Lungs: Respirations even and unlabored. Lungs clear to auscultation bilaterally.   No wheezes, crackles, or rhonchi.  Heart:  Regular rate and rhythm;  Without murmur, clicks, rubs or gallops Abdomen:  Soft, nondistended, nontender. Normal bowel sounds. No appreciable masses or hepatomegaly.  No rebound or guarding.  Rectal:  Not performed. Neurologic:  Alert and oriented x3;  grossly normal neurologically. Skin:  Intact without significant lesions or rashes. Cervical Nodes:  No significant cervical adenopathy. Psych:  Alert and cooperative. Normal affect.  LAB RESULTS:  Recent Labs  06/14/17 1440 06/14/17 1821 06/15/17 0123 06/15/17 0528  WBC 12.3*  --    --  6.3  HGB 10.8* 10.3* 10.2* 10.0*  HCT 31.8* 30.0* 29.7* 29.6*  PLT 234  --   --  202   BMET  Recent Labs  06/14/17 1440 06/15/17 0528  NA 136 140  K 3.9 3.3*  CL 103 109  CO2 23 25  GLUCOSE 242* 118*  BUN 24* 14  CREATININE 1.31* 0.89  CALCIUM 9.4 8.6*   LFT  Recent Labs  06/14/17 1440  PROT 7.2  ALBUMIN 3.8  AST 27  ALT 25  ALKPHOS 64  BILITOT 1.0   PT/INR No results for input(s): LABPROT, INR in the last 72 hours.  STUDIES: Dg Chest Portable 1 View  Result Date: 06/14/2017 CLINICAL DATA:  Epigastric pain, bradycardia, GI bleed EXAM: PORTABLE CHEST 1 VIEW COMPARISON:  07/15/2014 FINDINGS: Normal heart size and vascularity. Lungs remain clear. No focal pneumonia, collapse or consolidation. Negative for edema, effusion or pneumothorax. Degenerative changes of the spine. Old healed left fourth rib fracture noted. Atherosclerosis of the aorta. No gross free air evident. IMPRESSION: No acute finding by plain radiography.  Stable exam. Electronically Signed   By: Jerilynn Mages.  Shick M.D.   On: 06/14/2017 17:29      Impression / Plan:   Aaron Becker is a 83 y.o. y/o male with rectal bleeding , recent EGD shows esophageal and duodenal ulcer. Sigmoidoscopy showed no blood. This occasion had rectal bleeding with no significant drop in Hb . Unclear based on history if he truly had any blood loss.    Plan  1. Repeat CBC today and watch bowel movement. If there is evidence of further bleeding then  Repeat EGD on Monday  morning with colonoscopy to r/o colonic AVM's and treat any lesion that is bleeding in the  stomach.    Thank you for involving me in the care of this patient.      LOS: 1 day   [name], MD  06/15/2017, 4:11 PM

## 2017-06-15 NOTE — Progress Notes (Signed)
Pt has remained alert and oriented. No complaints of pain this shift. No stools this shift. Iv infusing without difficulty.

## 2017-06-15 NOTE — Progress Notes (Signed)
Spoke with Dr. Jannifer Franklin pt with FBS 240 and no sliding scale. Md to place orders.

## 2017-06-16 LAB — BASIC METABOLIC PANEL
ANION GAP: 10 (ref 5–15)
BUN: 10 mg/dL (ref 6–20)
CALCIUM: 8.8 mg/dL — AB (ref 8.9–10.3)
CO2: 24 mmol/L (ref 22–32)
Chloride: 102 mmol/L (ref 101–111)
Creatinine, Ser: 0.78 mg/dL (ref 0.61–1.24)
GFR calc Af Amer: >60 mL/min (ref >60)
GFR calc non Af Amer: >60 mL/min (ref >60)
GLUCOSE: 203 mg/dL — AB (ref 65–99)
Potassium: 3.7 mmol/L (ref 3.5–5.1)
Sodium: 136 mmol/L (ref 135–145)

## 2017-06-16 LAB — CBC
HEMATOCRIT: 32.4 % — AB (ref 40.0–52.0)
HEMOGLOBIN: 11.3 g/dL — AB (ref 13.0–18.0)
MCH: 31 pg (ref 26.0–34.0)
MCHC: 34.9 g/dL (ref 32.0–36.0)
MCV: 88.8 fL (ref 80.0–100.0)
Platelets: 223 10*3/uL (ref 150–440)
RBC: 3.65 MIL/uL — AB (ref 4.40–5.90)
RDW: 13.7 % (ref 11.5–14.5)
WBC: 13.4 10*3/uL — AB (ref 3.8–10.6)

## 2017-06-16 LAB — GLUCOSE, CAPILLARY
GLUCOSE-CAPILLARY: 304 mg/dL — AB (ref 65–99)
Glucose-Capillary: 184 mg/dL — ABNORMAL HIGH (ref 65–99)

## 2017-06-16 MED ORDER — PRAVASTATIN SODIUM 80 MG PO TABS: 80.0000 mg | ORAL_TABLET | Freq: Every day | ORAL | Status: DC

## 2017-06-16 MED ORDER — METFORMIN HCL 500 MG PO TABS: 500.0000 mg | ORAL_TABLET | Freq: Two times a day (BID) | ORAL | Status: DC

## 2017-06-16 NOTE — Evaluation (Signed)
Physical Therapy Evaluation Patient Details Name: CALIEB LICHTMAN MRN: [id number] DOB: 23-Jul-1930 Today's Date: 06/16/2017   History of Present Illness  83 yo male with onset of hematochezia with resolved hgb was referred to PT for mobility evaluation.  PMHx:  SVT, DM, bradycardia, syncope  Clinical Impression  Pt was noted to walk well after warming up with no AD, and then noted stiffness in neck when he complained that his ROM was stiff and unpleasant.  Instruction for ROM to retract chin, to sidebend and rotate neck with scapular stabilization caudally.  He is motivated and family agreed to continue with him since he may not fill script for outpatient PT.    Follow Up Recommendations Outpatient PT    Equipment Recommendations  None recommended by PT    Recommendations for Other Services       Precautions / Restrictions Precautions Precautions: Fall Restrictions Weight Bearing Restrictions: No      Mobility  Bed Mobility Overal bed mobility: Modified Independent             General bed mobility comments: using bed rail but able to get legs moved  Transfers Overall transfer level: Needs assistance Equipment used: 1 person hand held assist Transfers: Sit to/from Stand Sit to Stand: Supervision;Min guard         General transfer comment: min guard for safety given pt having had some bleeding  Ambulation/Gait Ambulation/Gait assistance: Min guard Ambulation Distance (Feet): 200 Feet (100 x 2) Assistive device: 1 person hand held assist Gait Pattern/deviations: Step-to pattern;Step-through pattern;Decreased stride length;Wide base of support;Trunk flexed;Drifts right/left Gait velocity: reduced Gait velocity interpretation: Below normal speed for age/gender General Gait Details: pt is a bit stiff in ankles and hips but more fluid in movement with more gait bouts  Stairs            Wheelchair Mobility    Modified Rankin (Stroke Patients Only)        Balance Overall balance assessment: History of Falls                                           Pertinent Vitals/Pain Pain Assessment: Faces Faces Pain Scale: Hurts little more Pain Location: neck with end of range stretches Pain Descriptors / Indicators: Sore Pain Intervention(s): Limited activity within patient's tolerance;Monitored during session;Repositioned    Home Living Family/patient expects to be discharged to:: Private residence Living Arrangements: Spouse/significant other Available Help at Discharge: Family;Available 24 hours/day Type of Home: House Home Access: Stairs to enter Entrance Stairs-Rails: Right;Left;Can reach both Entrance Stairs-Number of Steps: 4 Home Layout: One level Home Equipment: Walker - 2 wheels;Cane - single point      Prior Function Level of Independence: Independent         Comments: fell off a ladder years ago but no residual effects x stiffneck     Hand Dominance        Extremity/Trunk Assessment   Upper Extremity Assessment Upper Extremity Assessment: Overall WFL for tasks assessed    Lower Extremity Assessment Lower Extremity Assessment: Overall WFL for tasks assessed    Cervical / Trunk Assessment Cervical / Trunk Assessment: Kyphotic  Communication   Communication: HOH  Cognition Arousal/Alertness: Awake/alert Behavior During Therapy: WFL for tasks assessed/performed Overall Cognitive Status: Within Functional Limits for tasks assessed  General Comments: wife and daughter were present to observe his use of ROM to neck      General Comments General comments (skin integrity, edema, etc.): pt is able to maneuver with light touch to furniture, but is PLOF    Exercises     Assessment/Plan    PT Assessment Patient needs continued PT services  PT Problem List Decreased range of motion;Decreased mobility;Decreased activity tolerance;Decreased  balance;Decreased coordination;Decreased knowledge of use of DME;Decreased safety awareness;Pain       PT Treatment Interventions DME instruction;Gait training;Stair training;Functional mobility training;Therapeutic activities;Therapeutic exercise;Balance training;Neuromuscular re-education;Patient/family education    PT Goals (Current goals can be found in the Care Plan section)  Acute Rehab PT Goals Patient Stated Goal: to get home today PT Goal Formulation: With patient/family Time For Goal Achievement: 07/07/17 Potential to Achieve Goals: Good    Frequency Min 2X/week   Barriers to discharge Other (comment);Decreased caregiver support (wife had a RW for herself) pt must walk alone at home    Co-evaluation               AM-PAC PT "6 Clicks" Daily Activity  Outcome Measure Difficulty turning over in bed (including adjusting bedclothes, sheets and blankets)?: A Little Difficulty moving from lying on back to sitting on the side of the bed? : A Little Difficulty sitting down on and standing up from a chair with arms (e.g., wheelchair, bedside commode, etc,.)?: A Little Help needed moving to and from a bed to chair (including a wheelchair)?: A Little Help needed walking in hospital room?: A Little Help needed climbing 3-5 steps with a railing? : A Little 6 Click Score: 18    End of Session Equipment Utilized During Treatment: Gait belt Activity Tolerance: Patient tolerated treatment well;Patient limited by fatigue Patient left: in bed;with call bell/phone within reach;with bed alarm set;with family/visitor present Nurse Communication: Mobility status PT Visit Diagnosis: Other abnormalities of gait and mobility (R26.89)    Time: 6759-1638 PT Time Calculation (min) (ACUTE ONLY): 18 min   Charges:   PT Evaluation $PT Eval Moderate Complexity: 1 Mod     PT G Codes:   PT G-Codes **NOT FOR INPATIENT CLASS** Functional Assessment Tool Used: AM-PAC 6 Clicks Basic Mobility    Ramond Dial 06/16/2017, 10:49 AM   Mee Hives, PT MS Acute Rehab Dept. Number: [place] [facility] [place]

## 2017-06-16 NOTE — Care Management Note (Addendum)
Case Management Note  Patient Details  Name: Aaron Becker MRN: [id number] Date of Birth: 09/14/1930  Subjective/Objective:    Discussed discharge planning and home health with Mrs Mcleish and daughter. They chose [facility] to be Mr Vences's home health provider. A referral for HH-PT, RN was called to Marcie Bal at [facility] who requested that this writer fax the referral information to [facility] at [phone]. No other home needs identified. Daughter has phone number to [facility].                 Action/Plan:   Expected Discharge Date:  06/16/17               Expected Discharge Plan:  [place]  In-House Referral:  NA  Discharge planning Services  CM Consult  Post Acute Care Choice:  NA Choice offered to:  Spouse, Adult Children  DME Arranged:  N/A DME Agency:  NA  HH Arranged:  RN, PT HH Agency:  [facility]  Status of Service:  Completed, signed off  If discussed at [place] of Stay Meetings, dates discussed:    Additional Comments:  Tyesha Joffe A, RN 06/16/2017, 10:51 AM

## 2017-06-16 NOTE — Discharge Summary (Signed)
[name] NAME: Aaron Becker    MR#:  [id number]  DATE OF BIRTH:  June 13, 1930  DATE OF ADMISSION:  06/14/2017 ADMITTING PHYSICIAN: Nicholes Mango, MD  DATE OF DISCHARGE: 06/16/2017  PRIMARY CARE PHYSICIAN: Tonia Ghent, MD    ADMISSION DIAGNOSIS:  Hematochezia [K92.1]  DISCHARGE DIAGNOSIS:  Active Problems:   Hematochezia   SECONDARY DIAGNOSIS:   Past Medical History:  Diagnosis Date  . Arthritis    hands  . Bradycardia   . Colon polyp   . Diabetes mellitus, type II ([place]) 1986   oral meds  . Elevated PSA 2011   per Dr. Jacqlyn Larsen with [facility]  . [facility] (hard of hearing)    bilateral AIDES  . Hyperlipidemia   . Hypertension 02/02   controlled on meds  . Hypothyroidism 1980's  . Palpitations   . Right rib fracture   . Sleep apnea   . SVT (supraventricular tachycardia) ([place])    a. reported SVT during admission at [facility] 06/2014  . Syncope and collapse   . Wears dentures    upper and lower    HOSPITAL COURSE:   83 year old male who was recently discharged due to GI bleed from esophageal and gastric ulcer who presents with hematochezia.  1. Hematochezia:Patient had no further symptoms of hematochezia while in the hospital. His hemoglobin actually improved to 11.3 at the time of discharge without blood transfusion. He will follow-up with GI in 1-2 weeks.   2. Recent esophageal and gastric ulcer diagnosis by EGD:  He will Continue PPI Hemoglobin has remained stable and actually improved since admission.  3. Diabetes: He will continue ADA diet and outpatient glipizide 4. Hypothyroidism: Continue Synthroid  5. Hyperlipidemia: Continue pravastatin     DISCHARGE CONDITIONS AND DIET:   Stable for discharge and diabetic diet  CONSULTS OBTAINED:  Treatment Team:  [name], MD  DRUG ALLERGIES:   Allergies  Allergen Reactions  . Flomax [Tamsulosin Hcl] Other (See Comments)    syncope  . Ibuprofen Other (See  Comments)    Oral blisters at high doses  . Rosuvastatin     REACTION: muscle stiffness  . Sulfonamide Derivatives Rash    DISCHARGE MEDICATIONS:   Current Discharge Medication List    CONTINUE these medications which have CHANGED   Details  metFORMIN (GLUCOPHAGE) 500 MG tablet Take 1-2 tablets (500-1,000 mg total) by mouth 2 (two) times daily with a meal. 500 mg every morning and 1000 mg daily with supper (may take 1 extra tablet at night if needed)    pravastatin (PRAVACHOL) 80 MG tablet Take 1 tablet (80 mg total) by mouth at bedtime.      CONTINUE these medications which have NOT CHANGED   Details  diphenhydrAMINE (BENADRYL) 25 mg capsule Take 25 mg by mouth at bedtime as needed for allergies. PM    Docusate Calcium (STOOL SOFTENER PO) Take 1 tablet by mouth at bedtime as needed. For constipation    glimepiride (AMARYL) 1 MG tablet Take 2 tablets (2 mg total) by mouth daily with breakfast. Qty: 180 tablet, Refills: 3    glucose blood (ONE TOUCH ULTRA TEST) test strip TO TEST BLOOD SUGAR ONCE DAILY AND AS DIRECTED DX E11.9 Qty: 100 each, Refills: 3    levothyroxine (SYNTHROID, LEVOTHROID) 75 MCG tablet Take 1 tablet (75 mcg total) by mouth daily. Qty: 90 tablet, Refills: 3    Multiple Vitamins-Minerals (CENTRUM SILVER PO) Take 1 tablet by mouth daily.  pantoprazole (PROTONIX) 40 MG tablet Take 1 tablet (40 mg total) by mouth 2 (two) times daily. Qty: 60 tablet, Refills: 1    pyridOXINE (VITAMIN B-6) 100 MG tablet Take 100 mg by mouth daily.     vitamin C (ASCORBIC ACID) 500 MG tablet Take 500 mg by mouth daily.           Today   CHIEF COMPLAINT:   Doing well this morning. No acute issues overnight.  VITAL SIGNS:  Blood pressure (!) 141/77, pulse 90, temperature 99.6 F (37.6 C), temperature source Oral, resp. rate 18, height 5\' 2"  (1.575 m), weight 66.2 kg (146 lb), SpO2 97 %.   REVIEW OF SYSTEMS:  Review of Systems  Constitutional: Negative.   Negative for chills, fever and malaise/fatigue.  HENT: Negative.  Negative for ear discharge, ear pain, hearing loss, nosebleeds and sore throat.   Eyes: Negative.  Negative for blurred vision and pain.  Respiratory: Negative.  Negative for cough, hemoptysis, shortness of breath and wheezing.   Cardiovascular: Negative.  Negative for chest pain, palpitations and leg swelling.  Gastrointestinal: Negative.  Negative for abdominal pain, blood in stool, diarrhea, nausea and vomiting.  Genitourinary: Negative.  Negative for dysuria.  Musculoskeletal: Negative.  Negative for back pain.  Skin: Negative.   Neurological: Negative for dizziness, tremors, speech change, focal weakness, seizures and headaches.  Endo/Heme/Allergies: Negative.  Does not bruise/bleed easily.  Psychiatric/Behavioral: Negative.  Negative for depression, hallucinations and suicidal ideas.     PHYSICAL EXAMINATION:  GENERAL:  83 y.o.-year-old patient lying in the bed with no acute distress.  NECK:  Supple, no jugular venous distention. No thyroid enlargement, no tenderness.  LUNGS: Normal breath sounds bilaterally, no wheezing, rales,rhonchi  No use of accessory muscles of respiration.  CARDIOVASCULAR: S1, S2 normal. No murmurs, rubs, or gallops.  ABDOMEN: Soft, non-tender, non-distended. Bowel sounds present. No organomegaly or mass.  EXTREMITIES: No pedal edema, cyanosis, or clubbing.  PSYCHIATRIC: The patient is alert and oriented x 3.  SKIN: No obvious rash, lesion, or ulcer.   DATA REVIEW:   CBC  Recent Labs Lab 06/16/17 0406  WBC 13.4*  HGB 11.3*  HCT 32.4*  PLT 223    Chemistries   Recent Labs Lab 06/14/17 1440  06/16/17 0406  NA 136  < > 136  K 3.9  < > 3.7  CL 103  < > 102  CO2 23  < > 24  GLUCOSE 242*  < > 203*  BUN 24*  < > 10  CREATININE 1.31*  < > 0.78  CALCIUM 9.4  < > 8.8*  AST 27  --   --   ALT 25  --   --   ALKPHOS 64  --   --   BILITOT 1.0  --   --   < > = values in this  interval not displayed.  Cardiac Enzymes No results for input(s): TROPONINI in the last 168 hours.  Microbiology Results  @MICRORSLT48 @  RADIOLOGY:  Dg Chest Portable 1 View  Result Date: 06/14/2017 CLINICAL DATA:  Epigastric pain, bradycardia, GI bleed EXAM: PORTABLE CHEST 1 VIEW COMPARISON:  07/15/2014 FINDINGS: Normal heart size and vascularity. Lungs remain clear. No focal pneumonia, collapse or consolidation. Negative for edema, effusion or pneumothorax. Degenerative changes of the spine. Old healed left fourth rib fracture noted. Atherosclerosis of the aorta. No gross free air evident. IMPRESSION: No acute finding by plain radiography.  Stable exam. Electronically Signed   By: Jerilynn Mages.  Shick M.D.  On: 06/14/2017 17:29      Current Discharge Medication List    CONTINUE these medications which have CHANGED   Details  metFORMIN (GLUCOPHAGE) 500 MG tablet Take 1-2 tablets (500-1,000 mg total) by mouth 2 (two) times daily with a meal. 500 mg every morning and 1000 mg daily with supper (may take 1 extra tablet at night if needed)    pravastatin (PRAVACHOL) 80 MG tablet Take 1 tablet (80 mg total) by mouth at bedtime.      CONTINUE these medications which have NOT CHANGED   Details  diphenhydrAMINE (BENADRYL) 25 mg capsule Take 25 mg by mouth at bedtime as needed for allergies. PM    Docusate Calcium (STOOL SOFTENER PO) Take 1 tablet by mouth at bedtime as needed. For constipation    glimepiride (AMARYL) 1 MG tablet Take 2 tablets (2 mg total) by mouth daily with breakfast. Qty: 180 tablet, Refills: 3    glucose blood (ONE TOUCH ULTRA TEST) test strip TO TEST BLOOD SUGAR ONCE DAILY AND AS DIRECTED DX E11.9 Qty: 100 each, Refills: 3    levothyroxine (SYNTHROID, LEVOTHROID) 75 MCG tablet Take 1 tablet (75 mcg total) by mouth daily. Qty: 90 tablet, Refills: 3    Multiple Vitamins-Minerals (CENTRUM SILVER PO) Take 1 tablet by mouth daily.     pantoprazole (PROTONIX) 40 MG tablet Take  1 tablet (40 mg total) by mouth 2 (two) times daily. Qty: 60 tablet, Refills: 1    pyridOXINE (VITAMIN B-6) 100 MG tablet Take 100 mg by mouth daily.     vitamin C (ASCORBIC ACID) 500 MG tablet Take 500 mg by mouth daily.           Management plans discussed with the patient and he is in agreement. Stable for discharge   Patient should follow up with gi and PCP  CODE STATUS:     Code Status Orders        Start     Ordered   06/14/17 1909  Full code  Continuous     06/14/17 1908    Code Status History    Date Active Date Inactive Code Status Order ID Comments User Context   06/09/2017  7:44 PM 06/11/2017  5:42 PM Full Code [id number]  Vaughan Basta, MD Inpatient      TOTAL TIME TAKING CARE OF THIS PATIENT: 37 minutes.    Note: This dictation was prepared with Dragon dictation along with smaller phrase technology. Any transcriptional errors that result from this process are unintentional.  Emmogene Simson M.D on 06/16/2017 at 9:35 AM  Between 7am to 6pm - Pager - [phone] After 6pm go to [email or web address] - password [email or web address] [facility]  Office  [phone]  CC: Primary care physician; Tonia Ghent, MD

## 2017-06-16 NOTE — Progress Notes (Signed)
Pt alert and oriented. No stools this shift. Voiding without difficulty. No complaints of pain. Pt able to sleep in between care.

## 2017-06-16 NOTE — Progress Notes (Signed)
[name] MD, MRCP(U.K) [address]  [place]  [place], [place] [address]  Main: [phone]    Aaron Becker is being followed for GI bleed   Subjective: No complaints    Objective: Vital signs in last 24 hours: Vitals:   06/15/17 1652 06/15/17 1909 06/15/17 1952 06/16/17 0727  BP: (!) 143/76 (!) 152/72 139/76 (!) 141/77  Pulse: 85 91 94 90  Resp: 16 18 18 18   Temp: 99.3 F (37.4 C) 99.2 F (37.3 C) 99.5 F (37.5 C) 99.6 F (37.6 C)  TempSrc: Oral Oral Oral Oral  SpO2: 98% 100% 95% 97%  Weight:      Height:       Weight change:   Intake/Output Summary (Last 24 hours) at 06/16/17 1497 Last data filed at 06/16/17 0730  Gross per 24 hour  Intake              750 ml  Output             1450 ml  Net             -700 ml     Exam: Heart:: Regular rate and rhythm, S1S2 present or without murmur or extra heart sounds Lungs: normal, clear to auscultation and clear to auscultation and percussion Abdomen: soft, nontender, normal bowel sounds   Lab Results: @LABTEST2 @ Micro Results: No results found for this or any previous visit (from the past 240 hour(s)). Studies/Results: Dg Chest Portable 1 View  Result Date: 06/14/2017 CLINICAL DATA:  Epigastric pain, bradycardia, GI bleed EXAM: PORTABLE CHEST 1 VIEW COMPARISON:  07/15/2014 FINDINGS: Normal heart size and vascularity. Lungs remain clear. No focal pneumonia, collapse or consolidation. Negative for edema, effusion or pneumothorax. Degenerative changes of the spine. Old healed left fourth rib fracture noted. Atherosclerosis of the aorta. No gross free air evident. IMPRESSION: No acute finding by plain radiography.  Stable exam. Electronically Signed   By: Jerilynn Mages.  Shick M.D.   On: 06/14/2017 17:29   Medications: I have reviewed the patient's current medications. Scheduled Meds: . insulin aspart  0-5 Units Subcutaneous QHS  . insulin aspart  0-9 Units Subcutaneous TID WC  . levothyroxine  75 mcg Oral QAC  breakfast  . multivitamin with minerals  1 tablet Oral Daily  . pantoprazole (PROTONIX) IV  40 mg Intravenous Q12H  . pravastatin  80 mg Oral QHS  . pyridOXINE  100 mg Oral Daily  . vitamin C  500 mg Oral Daily   Continuous Infusions: PRN Meds:.acetaminophen **OR** acetaminophen, diphenhydrAMINE, ondansetron **OR** ondansetron (ZOFRAN) IV, traMADol CBC Latest Ref Rng & Units 06/16/2017 06/15/2017 06/15/2017  WBC 3.8 - 10.6 K/uL 13.4(H) 6.3 -  Hemoglobin 13.0 - 18.0 g/dL 11.3(L) 10.0(L) 10.2(L)  Hematocrit 40.0 - 52.0 % 32.4(L) 29.6(L) 29.7(L)  Platelets 150 - 440 K/uL 223 202 -     Assessment: Active Problems:   Hematochezia   Aaron Becker is a 83 y.o. y/o male with rectal bleeding , recent EGD shows esophageal and duodenal ulcer. Sigmoidoscopy showed no blood. This occasion had rectal bleeding with no significant drop in Hb . Unclear based on history if he truly had any blood loss.    Plan  1. Since Hb has been stable or rather risin and there is questionable bleed in the history , would not require any endoscopy at this time. Suggest to advance diet and if symptoms require call our office during week days or ER during after hours. Continue PPI. F/u  with Dr Allen Norris as an outpatient   I will sign off.  Please call me if any further GI concerns or questions.  We would like to thank you for the opportunity to participate in the care of Aaron Becker.      LOS: 2 days   [name] 06/16/2017, 9:18 AM

## 2017-06-17 ENCOUNTER — Telehealth: Payer: Self-pay

## 2017-06-17 ENCOUNTER — Telehealth: Payer: Self-pay | Admitting: Family Medicine

## 2017-06-17 NOTE — Telephone Encounter (Signed)
Cathy nurse with [facility]  Left v/m; pt has hospital f/u appt to see Dr Damita Dunnings 06/18/17. Pt was admitted to hospital over weekend for GI bleed. Cathy had referral to see pt and also referral for [facility] PT to strengthen pt; Tye Maryland saw pt this morning BP 100/60 P 100. Cathy plans to see pt again on 06/19/17; Cathy request cb with Dr Josefine Class orders of how often pt should be seen.

## 2017-06-17 NOTE — Telephone Encounter (Signed)
I spoke with Mrs Donahoe and pt has had 2 episodes of seizures where legs and arms were stiff and pt jerking hard all over for 15 - 30 seconds each time. Pt has appt with Dr Damita Dunnings on 06/18/17 but pt has never had seizure before and BS are high also. pts wife wants to make sure Dr Damita Dunnings thinks going to ED is the right thing to do. Dr Damita Dunnings said if truly a seizure does need to go to ED now. Pt's wife said thank you and she will take him to ED now. Will keep appt with Dr Damita Dunnings tomorrow as f/u hospital visit. FYI to Dr Damita Dunnings.

## 2017-06-17 NOTE — Telephone Encounter (Signed)
Patient Name: Aaron Becker  DOB: Jan 07, 1930    Initial Comment caller states home health nurse is needing directions. He has high blood sugar and his arms and legs are jerking. She states she sometimes doesnt have service in the building she is in and requests if possible to contact the patient if she doesnt answer.    Nurse Assessment  Nurse: Raphael Gibney, RN, Vanita Ingles Date/Time (Eastern Time): 06/17/2017 1:28:49 PM  Confirm and document reason for call. If symptomatic, describe symptoms. ---Caller states spouse is HOH. Blood sugar is high. Blood sugar 419 yesterday; blood sugar 230 am 362; 9 am today 226; 12 noon 363. Had 1 episode at noon where he was jerking all over that lasted 30 seconds; and another episode about 15 min later where he was jerking all over. Has appt tomorrow with Dr. Damita Dunnings. no history of seizures. Was admitted to the hospital on Friday and came home yesterday and had endoscopy and colonoscopy. They suspect cancer.  Does the patient have any new or worsening symptoms? ---Yes  Will a triage be completed? ---Yes  Related visit to physician within the last 2 weeks? ---Yes  Does the PT have any chronic conditions? (i.e. diabetes, asthma, etc.) ---Yes  List chronic conditions. ---diabetes; may have cancer;  Is this a behavioral health or substance abuse call? ---No     Guidelines    Guideline Title Affirmed Question Affirmed Notes  Seizure First seizure ever    Final Disposition User   Call EMS 911 Now Raphael Gibney, RN, Vanita Ingles    Comments  called alternate number and left message. Will try primary number.  Caller states she is Tye Maryland from [place] and then phone disconnected. Attempted to return call and left message.  does not want to call 911 or take pt back to hospital.  called back line and spoke to [place] and gave report that pt was in hospital and came home yesterday. Had endoscopy and colonoscopy and they suspect cancer. Blood is elevated. Had 2 episodes 15 min apart  starting at noon where he was jerking all over that lasted 30 seconds. Triage outcome of call 911 but wife does not want him to go back to the hospital and wants call back from Dr. Damita Dunnings.   Caller Disagree/Comply Disagree  Caller Understands Yes  PreDisposition Call Doctor

## 2017-06-18 ENCOUNTER — Telehealth: Payer: Self-pay | Admitting: Family Medicine

## 2017-06-18 ENCOUNTER — Ambulatory Visit (INDEPENDENT_AMBULATORY_CARE_PROVIDER_SITE_OTHER): Payer: Medicare Other | Admitting: Gastroenterology

## 2017-06-18 ENCOUNTER — Encounter: Payer: Self-pay | Admitting: Gastroenterology

## 2017-06-18 ENCOUNTER — Inpatient Hospital Stay: Payer: Medicare Other | Admitting: Family Medicine

## 2017-06-18 ENCOUNTER — Other Ambulatory Visit: Payer: Self-pay

## 2017-06-18 VITALS — BP 121/63 | HR 98 | Ht 65.0 in | Wt 148.0 lb

## 2017-06-18 DIAGNOSIS — C159 Malignant neoplasm of esophagus, unspecified: Secondary | ICD-10-CM

## 2017-06-18 NOTE — Progress Notes (Signed)
Primary Care Physician: Tonia Ghent, MD  Primary Gastroenterologist:  Dr. Lucilla Lame  Chief Complaint  Patient presents with  . Follow up EGD results    HPI: Aaron Becker is a 83 y.o. male here for follow-up after having upper endoscopy.  The patient and upper endoscopy due to GI bleeding.  The patient was found to have a ulcerated lesion in his distal esophagus.  This was biopsied and came back as poorly differentiated adenocarcinoma. The patient had been given this diagnosis yesterday but he 1, to discuss further his diagnosis and to have any of his questions answered.  Current Outpatient Prescriptions  Medication Sig Dispense Refill  . benazepril (LOTENSIN) 10 MG tablet TAKE 1 TABLET BY MOUTH EVERY DAY    . [employer] ALCOHOL SWABS PADS Use as instructed to test blood sugar once daily.  Diagnosis:  250.00  Non-insulin dependent.    . diphenhydrAMINE (BENADRYL) 25 mg capsule Take 25 mg by mouth at bedtime as needed for allergies. PM    . Docusate Calcium (STOOL SOFTENER PO) Take 1 tablet by mouth at bedtime as needed. For constipation    . folic acid (FOLVITE) 161 MCG tablet Take by mouth.    Marland Kitchen glimepiride (AMARYL) 1 MG tablet Take 2 tablets (2 mg total) by mouth daily with breakfast. 180 tablet 3  . glucose blood (ONE TOUCH ULTRA TEST) test strip TO TEST BLOOD SUGAR ONCE DAILY AND AS DIRECTED DX E11.9 100 each 3  . glyBURIDE-metformin (GLUCOVANCE) 2.5-500 MG tablet Take by mouth.    . hydrochlorothiazide (MICROZIDE) 12.5 MG capsule TAKE ONE CAPSULE BY MOUTH EVERY DAY    . ibuprofen (ADVIL,MOTRIN) 200 MG tablet Take 200 mg by mouth.    . levothyroxine (SYNTHROID, LEVOTHROID) 75 MCG tablet Take 1 tablet (75 mcg total) by mouth daily. 90 tablet 3  . Menthol, Topical Analgesic, 3.7 % GEL Apply topically.    . metFORMIN (GLUCOPHAGE) 500 MG tablet Take 1-2 tablets (500-1,000 mg total) by mouth 2 (two) times daily with a meal. 500 mg every morning and 1000 mg daily with supper (may take 1  extra tablet at night if needed)    . Multiple Vitamins-Minerals (CENTRUM SILVER PO) Take 1 tablet by mouth daily.     . Omega-3 Fatty Acids (FISH OIL PO) Take by mouth.    . pantoprazole (PROTONIX) 40 MG tablet Take 1 tablet (40 mg total) by mouth 2 (two) times daily. 60 tablet 1  . pravastatin (PRAVACHOL) 80 MG tablet Take 1 tablet (80 mg total) by mouth at bedtime.    . pyridOXINE (VITAMIN B-6) 100 MG tablet Take 100 mg by mouth daily.     . vitamin C (ASCORBIC ACID) 500 MG tablet Take 500 mg by mouth daily.      No current facility-administered medications for this visit.     Allergies as of 06/18/2017 - Review Complete 06/18/2017  Allergen Reaction Noted  . Flomax [tamsulosin hcl] Other (See Comments) 07/26/2014  . Ibuprofen Other (See Comments) 02/02/2015  . Rosuvastatin  01/23/2007  . Sulfa antibiotics Rash 07/07/2014  . Sulfonamide derivatives Rash 01/23/2007    ROS:  General: Negative for anorexia, weight loss, fever, chills, fatigue, weakness. ENT: Negative for hoarseness, difficulty swallowing , nasal congestion. CV: Negative for chest pain, angina, palpitations, dyspnea on exertion, peripheral edema.  Respiratory: Negative for dyspnea at rest, dyspnea on exertion, cough, sputum, wheezing.  GI: See history of present illness. GU:  Negative for dysuria, hematuria, urinary incontinence,  urinary frequency, nocturnal urination.  Endo: Negative for unusual weight change.    Physical Examination:   BP 121/63   Pulse 98   Ht 5\' 5"  (1.651 m)   Wt 148 lb (67.1 kg)   BMI 24.63 kg/m   General: Well-nourished, well-developed in no acute distress.  Eyes: No icterus. Conjunctivae pink. Extremities: No lower extremity edema. No clubbing or deformities. Neuro: Alert and oriented x 3.  Grossly intact. Skin: Warm and dry, no jaundice.   Psych: Alert and cooperative, normal mood and affect.  Labs:    Imaging Studies: Dg Chest Portable 1 View  Result Date:  06/14/2017 CLINICAL DATA:  Epigastric pain, bradycardia, GI bleed EXAM: PORTABLE CHEST 1 VIEW COMPARISON:  07/15/2014 FINDINGS: Normal heart size and vascularity. Lungs remain clear. No focal pneumonia, collapse or consolidation. Negative for edema, effusion or pneumothorax. Degenerative changes of the spine. Old healed left fourth rib fracture noted. Atherosclerosis of the aorta. No gross free air evident. IMPRESSION: No acute finding by plain radiography.  Stable exam. Electronically Signed   By: Jerilynn Mages.  Shick M.D.   On: 06/14/2017 17:29    Assessment and Plan:   Aaron Becker is a 83 y.o. y/o male who had an EGD for a GI bleed that showed him to have a ulcerated lesion in the esophagus at the GE junction that was poorly differentiated adenocarcinoma.  The patient has a visit set up with oncology.  The patient's diagnosis has been explained to him and his family.  The patient has had all his questions answered today.  They will contact me if they have any further questions.    Lucilla Lame, MD. Marval Regal   Note: This dictation was prepared with Dragon dictation along with smaller phrase technology. Any transcriptional errors that result from this process are unintentional.

## 2017-06-18 NOTE — Telephone Encounter (Signed)
Joaquim Lai called to cancel patient's appointment for tomorrow.  Patient saw Dr.Wohl today.

## 2017-06-19 ENCOUNTER — Inpatient Hospital Stay: Payer: Medicare Other | Admitting: Family Medicine

## 2017-06-19 NOTE — Telephone Encounter (Signed)
Presume RN visit weekly with HHPT 3x per week would be a reasonable place to start.  May need more often RN visits, may not be able to tolerate 3x per week PT depending on clinical status.  Thanks.

## 2017-06-19 NOTE — Telephone Encounter (Signed)
Spoke to [place] and provided verbal orders. States she has seen pt twice since his d/c and he seemed to be doing a lot better today. She will start off with visits 2x/wk and gradually increase to 3 as tolerated.

## 2017-06-19 NOTE — Progress Notes (Signed)
[facility] day:  06/20/2017  Chief Complaint: Aaron Becker is a 83 y.o. male with esophageal carcinoma who is referred in consultation by Dr. Allen Norris for assessment and management.  HPI:  The patient was admitted to [facility] from 06/09/2017 - 06/11/2017 with a GI bleed.  He presented with bright red blood per rectum as well as black colored stool x 2 and dizziness.  Stool was guaiac positive.  He was orthostatic.  Admission hemoglobin was 11.4 (baseline 13.7 in 2016).  EGD on 06/11/2017 revealed one cratered esophageal ulcer and stigmata of recent esophogeal bleeding at the GE junction.  Biopsy revealed poorly differentiated adenocarcinoma, signet ring type.  There was one non-bleeding cratered gastric ulcer with no stigmata of bleeding in the gastric antrum.  Duodenum was normal.  He had undergone colonoscopy on 03/30/2015 by Dr. Bary Castilla.  He was found to have a 20 mm tubulovillous adenoma of the rectum.  Flexible sigmoidoscopy on 06/01/2017 revealed a sessile polyp in the rectum and grade II non-bleeding internal hemorrhoids.  He was readmitted to [facility] from 06/14/2017 - 06/16/2017 with hematochezia. He described a large bowel movement mixed with blood.  He became dizzy and pale.  Initial hemoglobin was 10.6.  He was started on a Protonix drip.  He had no further bleeding.  Hemoglobin improved to 11.3.  Symptomatically, he is doing well. He has no physical complaints today. Patient is "very independent" per his daughter. He is able to complete all of his activities of daily living independently.  Since being discharged from the hospital,  he has had generalized weakness.  He has not had any bleeding.  He denies hematochezia, melena, or gross hematuria.  He denies fevers and sweats.  His family reports a 10-15 pound weight loss in the last 6 months. Patient has a had a "dry hacking" cough since being discharged from the hospital.  He denies nausea, vomiting,  diarrhea.  He denies any swallowing issues.  He denies any pain today in the clinic.   He is working with physical therapy at home 3 times a week.  He is also receiving home health nursing services; frequency undetermined at this point.    Past Medical History:  Diagnosis Date  . Arthritis    hands  . Bradycardia   . Colon polyp   . Diabetes mellitus, type II ([place]) 1986   oral meds  . Elevated PSA 2011   per Dr. Jacqlyn Larsen with Uro  . [facility] (hard of hearing)    bilateral AIDES  . Hyperlipidemia   . Hypertension 02/02   controlled on meds  . Hypothyroidism 1980's  . Palpitations   . Right rib fracture   . Sleep apnea   . SVT (supraventricular tachycardia) ([place])    a. reported SVT during admission at [facility] 06/2014  . Syncope and collapse   . Wears dentures    upper and lower    Past Surgical History:  Procedure Laterality Date  . [place], 09/2015   COMPRESSED FRACTURE SPINE  . carotid ultrasound  02/02   wnl  . CATARACT EXTRACTION W/PHACO Right 03/19/2016   Procedure: CATARACT EXTRACTION PHACO AND INTRAOCULAR LENS PLACEMENT (IOC);  Surgeon: Ronnell Freshwater, MD;  Location: [place];  Service: Ophthalmology;  Laterality: Right;  DIABETIC-oral med RIGHT  . CATARACT EXTRACTION W/PHACO Left 04/23/2016   Procedure: CATARACT EXTRACTION PHACO AND INTRAOCULAR LENS PLACEMENT (IOC);  Surgeon: Ronnell Freshwater, MD;  Location:  [place];  Service: Ophthalmology;  Laterality: Left;  LEFT DIABETIC - oral meds  . CHOLECYSTECTOMY  1967  . COLONOSCOPY W/ POLYPECTOMY  2012   Villous adenoma from the rectum without high-grade dysplasia, 10 mm  . COLONOSCOPY WITH PROPOFOL N/A 03/30/2015   Procedure: COLONOSCOPY WITH PROPOFOL;  Surgeon: Robert Bellow, MD;  Location: [facility] ENDOSCOPY;  Service: Endoscopy;  Laterality: N/A;  . CYST EXCISION     thumb  . CYSTECTOMY  06/14/04   mucous cyst excision with left thumb, IP joint debridement  .  ESOPHAGOGASTRODUODENOSCOPY (EGD) WITH PROPOFOL N/A 06/11/2017   Procedure: ESOPHAGOGASTRODUODENOSCOPY (EGD) WITH PROPOFOL;  Surgeon: Lucilla Lame, MD;  Location: [facility] ENDOSCOPY;  Service: Endoscopy;  Laterality: N/A;  . FLEXIBLE SIGMOIDOSCOPY N/A 06/11/2017   Procedure: FLEXIBLE SIGMOIDOSCOPY;  Surgeon: Lucilla Lame, MD;  Location: [facility] ENDOSCOPY;  Service: Endoscopy;  Laterality: N/A;  . HERNIA REPAIR  2013  . KYPHOPLASTY N/A 10/06/2015   Procedure: Thoracic twelve kyphoplasty;  Surgeon: Karie Chimera, MD;  Location: [place] NEURO ORS;  Service: Neurosurgery;  Laterality: N/A;  T12 Kyphoplasty  . PROSTATE BIOPSY  11/01/08   Dr. Reece Agar  . [place]  . VASECTOMY  1975    Family History  Problem Relation Age of Onset  . Cancer Father        liver  . Cancer Sister        breast  . Cancer Brother        skin  . Cancer Sister        breast  . Heart disease Sister        pacer  . Heart disease Sister   . Cancer Sister        breast cancer  . Heart disease Sister   . Colon cancer Neg Hx   . Prostate cancer Neg Hx     Social History:  reports that he has never smoked. He has never used smokeless tobacco. He reports that he does not drink alcohol or use drugs.  Retired Agricultural consultant; worked for [employer] for 44 years.  He was in the [employer].  He denies any exposure to radiation or toxins.  The patient is accompanied by Mable Fill (son), Hortense Ramal (best friend), [name] (daughter), and Mariea Clonts (nurse navigator) today.  Allergies:  Allergies  Allergen Reactions  . Flomax [Tamsulosin Hcl] Other (See Comments)    syncope  . Ibuprofen Other (See Comments)    Oral blisters at high doses  . Rosuvastatin     REACTION: muscle stiffness  . Sulfa Antibiotics Rash  . Sulfonamide Derivatives Rash    Current Medications: Current Outpatient Prescriptions  Medication Sig Dispense Refill  . [employer] ALCOHOL SWABS PADS Use as instructed to test blood sugar  once daily.  Diagnosis:  250.00  Non-insulin dependent.    . diphenhydrAMINE (BENADRYL) 25 mg capsule Take 25 mg by mouth at bedtime as needed for allergies. PM    . Docusate Calcium (STOOL SOFTENER PO) Take 1 tablet by mouth at bedtime as needed. For constipation    . glimepiride (AMARYL) 1 MG tablet Take 2 tablets (2 mg total) by mouth daily with breakfast. 180 tablet 3  . glucose blood (ONE TOUCH ULTRA TEST) test strip TO TEST BLOOD SUGAR ONCE DAILY AND AS DIRECTED DX E11.9 100 each 3  . levothyroxine (SYNTHROID, LEVOTHROID) 75 MCG tablet Take 1 tablet (75 mcg total) by mouth daily. 90 tablet 3  . Menthol, Topical Analgesic, 3.7 %  GEL Apply topically.    . metFORMIN (GLUCOPHAGE) 500 MG tablet Take 1-2 tablets (500-1,000 mg total) by mouth 2 (two) times daily with a meal. 500 mg every morning and 1000 mg daily with supper (may take 1 extra tablet at night if needed)    . Multiple Vitamins-Minerals (CENTRUM SILVER PO) Take 1 tablet by mouth daily.     . pantoprazole (PROTONIX) 40 MG tablet Take 1 tablet (40 mg total) by mouth 2 (two) times daily. 60 tablet 1  . pravastatin (PRAVACHOL) 80 MG tablet Take 1 tablet (80 mg total) by mouth at bedtime.    . pyridOXINE (VITAMIN B-6) 100 MG tablet Take 100 mg by mouth daily.     . vitamin C (ASCORBIC ACID) 500 MG tablet Take 500 mg by mouth daily.     . benazepril (LOTENSIN) 10 MG tablet TAKE 1 TABLET BY MOUTH EVERY DAY    . folic acid (FOLVITE) 970 MCG tablet Take by mouth.    . glyBURIDE-metformin (GLUCOVANCE) 2.5-500 MG tablet Take by mouth.    . hydrochlorothiazide (MICROZIDE) 12.5 MG capsule TAKE ONE CAPSULE BY MOUTH EVERY DAY    . ibuprofen (ADVIL,MOTRIN) 200 MG tablet Take 200 mg by mouth.    . Omega-3 Fatty Acids (FISH OIL PO) Take by mouth.     No current facility-administered medications for this visit.     Review of Systems:  GENERAL:  Feels "pretty good/not too bad".  Active in yard.  No fevers or sweats. Weight loss of 10-15 pounds in  the last 6 months.  PERFORMANCE STATUS (ECOG):  2 HEENT:  Occupation related hearing loss.  Bilateral hearing aids. No visual changes, runny nose, sore throat, mouth sores or tenderness. Lungs: Cough (see HPI).  No shortness of breath.  No hemoptysis. Cardiac:  No chest pain, palpitations, orthopnea, or PND. GI:  No swallowing difficulties.  No nausea, vomiting, diarrhea, constipation, melena or hematochezia. GU:  No urgency, frequency, dysuria, or hematuria. Musculoskeletal:  No back pain.  No joint pain.  No muscle tenderness. Extremities:  Arthritis in hands; joints stiff and "ache". No swelling. Skin:  No rashes or skin changes. Neuro:  No headache, numbness or weakness, balance or coordination issues. Endocrine:  Diabetes.  Hypothyroid on supplementation.  No hot flashes or night sweats. Psych:  No mood changes, depression or anxiety. Pain:  No focal pain. Review of systems:  All other systems reviewed and found to be negative.   Physical Exam: Blood pressure 139/71, pulse 78, temperature 98.3 F (36.8 C), temperature source Tympanic, resp. rate 18, height 5' 4.57" (1.64 m), weight 148 lb (67.1 kg). GENERAL:  Thin elderly gentleman sitting comfortably in the exam room in no acute distress. MENTAL STATUS:  Alert and oriented to person, place and time. HEAD:  Grey thin hair.  Normocephalic, atraumatic, face symmetric, no Cushingoid features. EYES:  Glasses.  Blue eyes.  Pupils equal round and reactive to light and accomodation.  No conjunctivitis or scleral icterus. ENT:  Oropharynx clear without lesion.  Tongue normal.  Dentures.  Mucous membranes moist.  RESPIRATORY:  Clear to auscultation without rales, wheezes or rhonchi. CARDIOVASCULAR:  Regular rate and rhythm without murmur, rub or gallop. ABDOMEN:  Soft, non-tender, with active bowel sounds, and no hepatosplenomegaly.  No masses. SKIN:  No rashes, ulcers or lesions. EXTREMITIES: Arthritic nodules in hands. No edema, no skin  discoloration or tenderness.  No palpable cords. LYMPH NODES: No palpable cervical, supraclavicular, axillary or inguinal adenopathy  NEUROLOGICAL: Unremarkable. PSYCH:  Appropriate.   No visits with results within 3 Day(s) from this visit.  Latest known visit with results is:  Admission on 06/14/2017, Discharged on 06/16/2017  Component Date Value Ref Range Status  . WBC 06/14/2017 12.3* 3.8 - 10.6 K/uL Final  . RBC 06/14/2017 3.54* 4.40 - 5.90 MIL/uL Final  . Hemoglobin 06/14/2017 10.8* 13.0 - 18.0 g/dL Final  . HCT 06/14/2017 31.8* 40.0 - 52.0 % Final  . MCV 06/14/2017 89.7  80.0 - 100.0 fL Final  . MCH 06/14/2017 30.4  26.0 - 34.0 pg Final  . MCHC 06/14/2017 33.8  32.0 - 36.0 g/dL Final  . RDW 06/14/2017 13.8  11.5 - 14.5 % Final  . Platelets 06/14/2017 234  150 - 440 K/uL Final  . Color, Urine 06/14/2017 STRAW* YELLOW Final  . APPearance 06/14/2017 CLEAR* CLEAR Final  . Specific Gravity, Urine 06/14/2017 1.010  1.005 - 1.030 Final  . pH 06/14/2017 5.0  5.0 - 8.0 Final  . Glucose, UA 06/14/2017 150* NEGATIVE mg/dL Final  . Hgb urine dipstick 06/14/2017 NEGATIVE  NEGATIVE Final  . Bilirubin Urine 06/14/2017 NEGATIVE  NEGATIVE Final  . Ketones, ur 06/14/2017 NEGATIVE  NEGATIVE mg/dL Final  . Protein, ur 06/14/2017 30* NEGATIVE mg/dL Final  . Nitrite 06/14/2017 NEGATIVE  NEGATIVE Final  . Leukocytes, UA 06/14/2017 NEGATIVE  NEGATIVE Final  . RBC / HPF 06/14/2017 NONE SEEN  0 - 5 RBC/hpf Final  . WBC, UA 06/14/2017 NONE SEEN  0 - 5 WBC/hpf Final  . Bacteria, UA 06/14/2017 NONE SEEN  NONE SEEN Final  . Squamous Epithelial / LPF 06/14/2017 NONE SEEN  NONE SEEN Final  . Mucus 06/14/2017 PRESENT   Final  . Sodium 06/14/2017 136  135 - 145 mmol/L Final  . Potassium 06/14/2017 3.9  3.5 - 5.1 mmol/L Final  . Chloride 06/14/2017 103  101 - 111 mmol/L Final  . CO2 06/14/2017 23  22 - 32 mmol/L Final  . Glucose, Bld 06/14/2017 242* 65 - 99 mg/dL Final  . BUN 06/14/2017 24* 6 - 20 mg/dL  Final  . Creatinine, Ser 06/14/2017 1.31* 0.61 - 1.24 mg/dL Final  . Calcium 06/14/2017 9.4  8.9 - 10.3 mg/dL Final  . Total Protein 06/14/2017 7.2  6.5 - 8.1 g/dL Final  . Albumin 06/14/2017 3.8  3.5 - 5.0 g/dL Final  . AST 06/14/2017 27  15 - 41 U/L Final  . ALT 06/14/2017 25  17 - 63 U/L Final  . Alkaline Phosphatase 06/14/2017 64  38 - 126 U/L Final  . Total Bilirubin 06/14/2017 1.0  0.3 - 1.2 mg/dL Final  . GFR calc non Af Amer 06/14/2017 48* >60 mL/min Final  . GFR calc Af Amer 06/14/2017 56* >60 mL/min Final   Comment: (NOTE) The eGFR has been calculated using the CKD EPI equation. This calculation has not been validated in all clinical situations. eGFR's persistently <60 mL/min signify possible Chronic Kidney Disease.   . Anion gap 06/14/2017 10  5 - 15 Final  . ABO/RH(D) 06/14/2017 A POS   Final  . Antibody Screen 06/14/2017 NEG   Final  . Sample Expiration 06/14/2017 06/17/2017   Final  . Hemoglobin 06/14/2017 10.3* 13.0 - 18.0 g/dL Final  . HCT 06/14/2017 30.0* 40.0 - 52.0 % Final  . Hemoglobin 06/15/2017 10.2* 13.0 - 18.0 g/dL Final  . HCT 06/15/2017 29.7* 40.0 - 52.0 % Final  . WBC 06/15/2017 6.3  3.8 - 10.6 K/uL Final  . RBC 06/15/2017 3.31* 4.40 - 5.90  MIL/uL Final  . Hemoglobin 06/15/2017 10.0* 13.0 - 18.0 g/dL Final  . HCT 06/15/2017 29.6* 40.0 - 52.0 % Final  . MCV 06/15/2017 89.3  80.0 - 100.0 fL Final  . MCH 06/15/2017 30.3  26.0 - 34.0 pg Final  . MCHC 06/15/2017 34.0  32.0 - 36.0 g/dL Final  . RDW 06/15/2017 13.7  11.5 - 14.5 % Final  . Platelets 06/15/2017 202  150 - 440 K/uL Final  . Sodium 06/15/2017 140  135 - 145 mmol/L Final  . Potassium 06/15/2017 3.3* 3.5 - 5.1 mmol/L Final  . Chloride 06/15/2017 109  101 - 111 mmol/L Final  . CO2 06/15/2017 25  22 - 32 mmol/L Final  . Glucose, Bld 06/15/2017 118* 65 - 99 mg/dL Final  . BUN 06/15/2017 14  6 - 20 mg/dL Final  . Creatinine, Ser 06/15/2017 0.89  0.61 - 1.24 mg/dL Final  . Calcium 06/15/2017 8.6* 8.9  - 10.3 mg/dL Final  . GFR calc non Af Amer 06/15/2017 >60  >60 mL/min Final  . GFR calc Af Amer 06/15/2017 >60  >60 mL/min Final   Comment: (NOTE) The eGFR has been calculated using the CKD EPI equation. This calculation has not been validated in all clinical situations. eGFR's persistently <60 mL/min signify possible Chronic Kidney Disease.   . Anion gap 06/15/2017 6  5 - 15 Final  . Glucose-Capillary 06/15/2017 114* 65 - 99 mg/dL Final  . Glucose-Capillary 06/15/2017 117* 65 - 99 mg/dL Final  . Glucose-Capillary 06/15/2017 214* 65 - 99 mg/dL Final  . WBC 06/16/2017 13.4* 3.8 - 10.6 K/uL Final  . RBC 06/16/2017 3.65* 4.40 - 5.90 MIL/uL Final  . Hemoglobin 06/16/2017 11.3* 13.0 - 18.0 g/dL Final  . HCT 06/16/2017 32.4* 40.0 - 52.0 % Final  . MCV 06/16/2017 88.8  80.0 - 100.0 fL Final  . MCH 06/16/2017 31.0  26.0 - 34.0 pg Final  . MCHC 06/16/2017 34.9  32.0 - 36.0 g/dL Final  . RDW 06/16/2017 13.7  11.5 - 14.5 % Final  . Platelets 06/16/2017 223  150 - 440 K/uL Final  . Sodium 06/16/2017 136  135 - 145 mmol/L Final  . Potassium 06/16/2017 3.7  3.5 - 5.1 mmol/L Final  . Chloride 06/16/2017 102  101 - 111 mmol/L Final  . CO2 06/16/2017 24  22 - 32 mmol/L Final  . Glucose, Bld 06/16/2017 203* 65 - 99 mg/dL Final  . BUN 06/16/2017 10  6 - 20 mg/dL Final  . Creatinine, Ser 06/16/2017 0.78  0.61 - 1.24 mg/dL Final  . Calcium 06/16/2017 8.8* 8.9 - 10.3 mg/dL Final  . GFR calc non Af Amer 06/16/2017 >60  >60 mL/min Final  . GFR calc Af Amer 06/16/2017 >60  >60 mL/min Final   Comment: (NOTE) The eGFR has been calculated using the CKD EPI equation. This calculation has not been validated in all clinical situations. eGFR's persistently <60 mL/min signify possible Chronic Kidney Disease.   . Anion gap 06/16/2017 10  5 - 15 Final  . Glucose-Capillary 06/15/2017 159* 65 - 99 mg/dL Final  . Glucose-Capillary 06/15/2017 240* 65 - 99 mg/dL Final  . Glucose-Capillary 06/16/2017 184* 65 - 99  mg/dL Final  . Glucose-Capillary 06/16/2017 304* 65 - 99 mg/dL Final    Assessment:  Aaron Becker is a 83 y.o. male with esophageal cancer.  He presented with an upper GI bleed.  EGD on 06/11/2017 revealed one cratered esophageal ulcer and stigmata of recent esophogeal bleeding at the  GE junction.  Biopsy revealed poorly differentiated adenocarcinoma, signet ring type.  There was one non-bleeding cratered gastric ulcer with no stigmata of bleeding in the gastric antrum.  Duodenum was normal.  Colonoscopy on 03/30/2015 revealed a 20 mm tubulovillous adenoma of the rectum.  Flexible sigmoidoscopy on 06/01/2017 revealed a sessile polyp in the rectum and grade II non-bleeding internal hemorrhoids.  Symptomatically, he denies any dysphagia.  He denies any melena or hematochezia.  Exam is unremarkable.  Plan: 1.  Discuss diagnosis, staging, and management of esophageal carcinoma. Patient has reservations about treating his disease.  He has been considering doing "nothing".  His wife had breast cancer and he "watched her suffer".  We discussed the natural history of esophageal cancer.  We discussed obtaining imaging for staging purposes and discussion regarding treatment options.  He is not a surgical candidate.  We discussed endoscopic therapy for superficial disease as well as radiation +/- weekly carboplatin/Taxol for locally advanced disease. 2.  Discuss need for EUS. In efforts to expedite care, we will schedule in [facility] (patient prefers closer location) or possibly [facility]. 3.  Discuss need for PET scan to assess for metastasis. Will schedule for next week.  4.  RTC for MD assessment, review of imaging, and discussion regarding direction of therapy.   Honor Loh, NP  06/20/2017, 3:26 PM    I saw and evaluated the patient, participating in the key portions of the service and reviewing pertinent diagnostic studies and records.  I reviewed the nurse practitioner's note and agree with the  findings and the plan.  The assessment and plan were discussed with the patient.  Additional diagnostic studies of an EUS and PET scan are needed to clarify the patient's stage and would change the clinical management.  Multiple questions were asked by the patient and answered.   Lequita Asal, MD 06/20/2017,5:58 PM

## 2017-06-20 ENCOUNTER — Encounter: Payer: Self-pay | Admitting: Hematology and Oncology

## 2017-06-20 ENCOUNTER — Inpatient Hospital Stay: Payer: Medicare Other | Attending: Hematology and Oncology | Admitting: Hematology and Oncology

## 2017-06-20 VITALS — BP 139/71 | HR 78 | Temp 98.3°F | Resp 18 | Ht 64.57 in | Wt 148.0 lb

## 2017-06-20 DIAGNOSIS — K648 Other hemorrhoids: Secondary | ICD-10-CM | POA: Insufficient documentation

## 2017-06-20 DIAGNOSIS — Z8719 Personal history of other diseases of the digestive system: Secondary | ICD-10-CM | POA: Diagnosis not present

## 2017-06-20 DIAGNOSIS — E119 Type 2 diabetes mellitus without complications: Secondary | ICD-10-CM

## 2017-06-20 DIAGNOSIS — Z882 Allergy status to sulfonamides status: Secondary | ICD-10-CM

## 2017-06-20 DIAGNOSIS — C159 Malignant neoplasm of esophagus, unspecified: Secondary | ICD-10-CM | POA: Insufficient documentation

## 2017-06-20 DIAGNOSIS — R531 Weakness: Secondary | ICD-10-CM

## 2017-06-20 DIAGNOSIS — E039 Hypothyroidism, unspecified: Secondary | ICD-10-CM | POA: Diagnosis not present

## 2017-06-20 DIAGNOSIS — I10 Essential (primary) hypertension: Secondary | ICD-10-CM | POA: Diagnosis not present

## 2017-06-20 DIAGNOSIS — R634 Abnormal weight loss: Secondary | ICD-10-CM | POA: Diagnosis not present

## 2017-06-20 DIAGNOSIS — R05 Cough: Secondary | ICD-10-CM | POA: Diagnosis not present

## 2017-06-20 DIAGNOSIS — E785 Hyperlipidemia, unspecified: Secondary | ICD-10-CM

## 2017-06-20 DIAGNOSIS — K254 Chronic or unspecified gastric ulcer with hemorrhage: Secondary | ICD-10-CM | POA: Diagnosis not present

## 2017-06-20 DIAGNOSIS — K621 Rectal polyp: Secondary | ICD-10-CM | POA: Insufficient documentation

## 2017-06-20 DIAGNOSIS — Z79899 Other long term (current) drug therapy: Secondary | ICD-10-CM | POA: Diagnosis not present

## 2017-06-20 DIAGNOSIS — Z7984 Long term (current) use of oral hypoglycemic drugs: Secondary | ICD-10-CM | POA: Diagnosis not present

## 2017-06-20 DIAGNOSIS — G4730 Sleep apnea, unspecified: Secondary | ICD-10-CM | POA: Insufficient documentation

## 2017-06-20 NOTE — Progress Notes (Signed)
  Oncology Nurse Navigator Documentation Met with Aaron Becker and his family before, during, and after consult with Dr. Mike Gip. Introduced Therapist, nutritional and provided contact information for any future needs. Referral placed to Dr. Owens Loffler for EUS for staging. Inbox sent to Katina Degree at his office. PET scheduled for 10/4. Went over diabetic instructions with him and his family. Written instructions also provided. Navigator Location: [facility] (06/20/17 1600)   )Navigator Encounter Type: Initial MedOnc (06/20/17 1600)                     Patient Visit Type: MedOnc;Initial (06/20/17 1600)                              Time Spent with Patient: 60 (06/20/17 1600)

## 2017-06-20 NOTE — Progress Notes (Signed)
Here for new pt evaluation.  

## 2017-06-20 NOTE — Patient Instructions (Signed)
Endoscopic Ultrasound with Dr. Owens Loffler in [place]

## 2017-06-21 ENCOUNTER — Telehealth: Payer: Self-pay

## 2017-06-21 DIAGNOSIS — E119 Type 2 diabetes mellitus without complications: Secondary | ICD-10-CM | POA: Diagnosis not present

## 2017-06-21 NOTE — Telephone Encounter (Signed)
PET will be sent to our office after reviewed

## 2017-06-21 NOTE — Telephone Encounter (Signed)
Message sent to [employer]

## 2017-06-21 NOTE — Telephone Encounter (Signed)
-----   Message from [name], MD sent at 06/21/2017  8:54 AM EDT ----- Regarding: RE: EUS needed Happy to help but he has not had staging imaging yet; looks like PET CT is scheduled for next week and if this shows clear evidence of distant metastasis or overt PET avid adenopathy then there is no need for EUS staging information.  Can you please have their office forward the results of the PET scan (or any other imaging being done to stage the cancer) that is scheduled for 10/4.    Thanks  ----- Message ----- From: Jeoffrey Massed, RN Sent: 06/21/2017   8:30 AM To: [name], MD Subject: [name]: EUS needed                                   ----- Message ----- From: Clent Jacks, RN Sent: 06/20/2017   4:31 PM To: Jeoffrey Massed, RN Subject: EUS needed                                     Hi Peta Peachey, I have requested an EUS for this patient. He is newly diagnosed esophageal adenocarcinoma. We would love to have it done next week if possible. Thanks so much!

## 2017-06-24 ENCOUNTER — Ambulatory Visit: Payer: Medicare Other | Admitting: Oncology

## 2017-06-24 ENCOUNTER — Telehealth: Payer: Self-pay

## 2017-06-24 NOTE — Telephone Encounter (Signed)
  Oncology Nurse Navigator Documentation Notified Aaron Becker that Dr. Ardis Hughs would like to see the results of the PET scan before scheduling EUS. Hopefully by Friday we will have a date for the EUS. Navigator Location: [facility] (06/24/17 1000)   )Navigator Encounter Type: Telephone (06/24/17 1000)                                                    Time Spent with Patient: 15 (06/24/17 1000)

## 2017-06-26 ENCOUNTER — Ambulatory Visit (INDEPENDENT_AMBULATORY_CARE_PROVIDER_SITE_OTHER): Payer: Medicare Other | Admitting: Family Medicine

## 2017-06-26 ENCOUNTER — Encounter: Payer: Self-pay | Admitting: Family Medicine

## 2017-06-26 VITALS — BP 118/64 | HR 76 | Temp 98.4°F | Wt 149.0 lb

## 2017-06-26 DIAGNOSIS — K922 Gastrointestinal hemorrhage, unspecified: Secondary | ICD-10-CM | POA: Diagnosis not present

## 2017-06-26 DIAGNOSIS — C159 Malignant neoplasm of esophagus, unspecified: Secondary | ICD-10-CM

## 2017-06-26 DIAGNOSIS — E119 Type 2 diabetes mellitus without complications: Secondary | ICD-10-CM | POA: Diagnosis not present

## 2017-06-26 MED ORDER — GLUCOSE BLOOD VI STRP: ORAL_STRIP | 3 refills | Status: DC

## 2017-06-26 NOTE — Patient Instructions (Signed)
Go to the lab on the way out.  We'll contact you with your lab report. Flu shot when possible, after the PET scan.  Go to the lab on the way out.  We'll contact you with your lab report. Take care.  Glad to see you.  Update me as needed.

## 2017-06-26 NOTE — Progress Notes (Signed)
Admitted with GI bleed. EGD done. Adenocarcinoma of esophagus incidentally noted. Patient is aware. Has seen GI clinic in the meantime. Has seen oncology. His staging PET scan tomorrow. Has not had any more bleeding in the meantime. No, pain. No vomiting. No black stools. No fevers. Diet back to baseline. Appetite is fair.  Due for f/u labs.    Variable sugar 140-190 and variable dosing of meds.   He needs rx for strips. #200 with 3RF.  One touch ultra test strips, rx printed and given to patient.    PMH and SH reviewed  ROS: Per HPI unless specifically indicated in ROS section   Meds, vitals, and allergies reviewed.   GEN: nad, alert and oriented HEENT: mucous membranes moist NECK: supple w/o LA CV: rrr. PULM: ctab, no inc wob ABD: soft, +bs EXT: no edema SKIN: no acute rash

## 2017-06-27 ENCOUNTER — Telehealth: Payer: Self-pay

## 2017-06-27 ENCOUNTER — Other Ambulatory Visit: Payer: Self-pay

## 2017-06-27 ENCOUNTER — Ambulatory Visit
Admission: RE | Admit: 2017-06-27 | Discharge: 2017-06-27 | Disposition: A | Discharge status: Home / Self Care | Payer: Medicare Other | Source: Ambulatory Visit | Attending: Urgent Care | Admitting: Urgent Care

## 2017-06-27 ENCOUNTER — Other Ambulatory Visit: Payer: Self-pay | Admitting: Family Medicine

## 2017-06-27 ENCOUNTER — Encounter: Payer: Self-pay | Admitting: Family Medicine

## 2017-06-27 DIAGNOSIS — K5730 Diverticulosis of large intestine without perforation or abscess without bleeding: Secondary | ICD-10-CM | POA: Insufficient documentation

## 2017-06-27 DIAGNOSIS — C159 Malignant neoplasm of esophagus, unspecified: Secondary | ICD-10-CM | POA: Insufficient documentation

## 2017-06-27 DIAGNOSIS — N400 Enlarged prostate without lower urinary tract symptoms: Secondary | ICD-10-CM | POA: Insufficient documentation

## 2017-06-27 DIAGNOSIS — I2510 Atherosclerotic heart disease of native coronary artery without angina pectoris: Secondary | ICD-10-CM | POA: Insufficient documentation

## 2017-06-27 DIAGNOSIS — Z01812 Encounter for preprocedural laboratory examination: Secondary | ICD-10-CM | POA: Diagnosis present

## 2017-06-27 DIAGNOSIS — I700 Atherosclerosis of aorta: Secondary | ICD-10-CM | POA: Insufficient documentation

## 2017-06-27 DIAGNOSIS — J320 Chronic maxillary sinusitis: Secondary | ICD-10-CM | POA: Diagnosis not present

## 2017-06-27 DIAGNOSIS — D649 Anemia, unspecified: Secondary | ICD-10-CM

## 2017-06-27 LAB — CBC WITH DIFFERENTIAL/PLATELET
BASOS PCT: 1.4 % (ref 0.0–3.0)
Basophils Absolute: 0.1 10*3/uL (ref 0.0–0.1)
EOS PCT: 2.2 % (ref 0.0–5.0)
Eosinophils Absolute: 0.2 10*3/uL (ref 0.0–0.7)
HEMATOCRIT: 33 % — AB (ref 39.0–52.0)
HEMOGLOBIN: 10.8 g/dL — AB (ref 13.0–17.0)
LYMPHS PCT: 14.2 % (ref 12.0–46.0)
Lymphs Abs: 1.1 10*3/uL (ref 0.7–4.0)
MCHC: 32.7 g/dL (ref 30.0–36.0)
MCV: 91.6 fl (ref 78.0–100.0)
MONOS PCT: 8.2 % (ref 3.0–12.0)
Monocytes Absolute: 0.6 10*3/uL (ref 0.1–1.0)
NEUTROS ABS: 5.7 10*3/uL (ref 1.4–7.7)
Neutrophils Relative %: 74 % (ref 43.0–77.0)
PLATELETS: 380 10*3/uL (ref 150.0–400.0)
RBC: 3.61 Mil/uL — ABNORMAL LOW (ref 4.22–5.81)
RDW: 14.1 % (ref 11.5–15.5)
WBC: 7.6 10*3/uL (ref 4.0–10.5)

## 2017-06-27 LAB — GLUCOSE, CAPILLARY: GLUCOSE-CAPILLARY: 85 mg/dL (ref 65–99)

## 2017-06-27 LAB — BASIC METABOLIC PANEL
BUN: 18 mg/dL (ref 6–23)
CHLORIDE: 104 meq/L (ref 96–112)
CO2: 27 meq/L (ref 19–32)
Calcium: 9.6 mg/dL (ref 8.4–10.5)
Creatinine, Ser: 1.45 mg/dL (ref 0.40–1.50)
GFR: 49.16 mL/min — ABNORMAL LOW (ref >60.00)
GLUCOSE: 136 mg/dL — AB (ref 70–99)
Potassium: 5.5 mEq/L — ABNORMAL HIGH (ref 3.5–5.1)
SODIUM: 139 meq/L (ref 135–145)

## 2017-06-27 MED ORDER — FLUDEOXYGLUCOSE F - 18 (FDG) INJECTION
12.0000 | Freq: Once | INTRAVENOUS | Status: AC | PRN
  Administered 2017-06-27: 12.85 via INTRAVENOUS

## 2017-06-27 NOTE — Assessment & Plan Note (Signed)
He has variable sugar 140-190 and variable dosing of meds based on sugar readings.  He needs rx for strips. #200 with 3RF.  One touch ultra test strips, rx printed and given to patient.

## 2017-06-27 NOTE — Telephone Encounter (Signed)
-----   Message from [name], MD sent at 06/27/2017  2:45 PM EDT ----- Regarding: RE: EUS needed Aaron Becker, Thanks  Georgianne Gritz, He needs upper EUS, radial +/- linear, with MAC sedation next Thursday (10/11) at [facility] for esophageal cancer staging. Thanks  DJ   ----- Message ----- From: Jeoffrey Massed, RN Sent: 06/27/2017   2:41 PM To: [name], MD Subject: [name]: EUS needed                                   ----- Message ----- From: Clent Jacks, RN Sent: 06/27/2017   2:38 PM To: Jeoffrey Massed, RN Subject: RE: EUS needed                                 Hi [name], I just routed PET results for [name] for requested EUS to you and Dr. Ardis Hughs. Thanks so much. ----- Message ----- From: Jeoffrey Massed, RN Sent: 06/21/2017   9:15 AM To: Clent Jacks, RN Subject: FW: EUS needed                                 Aaron Becker please see Dr Ardis Hughs message.  Thanks  ----- Message ----- From: [name], MD Sent: 06/21/2017   8:54 AM To: Jeoffrey Massed, RN Subject: RE: EUS needed                                 Happy to help but he has not had staging imaging yet; looks like PET CT is scheduled for next week and if this shows clear evidence of distant metastasis or overt PET avid adenopathy then there is no need for EUS staging information.  Can you please have their office forward the results of the PET scan (or any other imaging being done to stage the cancer) that is scheduled for 10/4.    Thanks  ----- Message ----- From: Jeoffrey Massed, RN Sent: 06/21/2017   8:30 AM To: [name], MD Subject: [name]: EUS needed                                   ----- Message ----- From: Clent Jacks, RN Sent: 06/20/2017   4:31 PM To: Jeoffrey Massed, RN Subject: EUS needed                                     Hi Maigan Bittinger, I have requested an EUS for this patient. He is newly diagnosed esophageal adenocarcinoma. We would love to have it done next week if possible. Thanks so  much!

## 2017-06-27 NOTE — Telephone Encounter (Signed)
EUS scheduled, pt instructed and medications reviewed.  Patient instructions mailed to home.  Patient to call with any questions or concerns.  

## 2017-06-27 NOTE — Assessment & Plan Note (Signed)
Patient aware of pathology. CAT scan is pending. No more bleeding. No abdominal pain. His wife died years ago of breast cancer. He is worried about a similar outcome, especially since she had a very difficult time with treatment. We talked about all of this. His affect is appropriate. His insight is good. We both agreed that it was reasonable to get the PET scan and staging information collected so that he can make an informed choice about his options. In the meantime recheck routine labs given the previous bleeding episode. No change in meds at this point. I appreciate the help of all involved. >40 minutes spent in face to face time with patient, >50% spent in counselling or coordination of care

## 2017-06-28 ENCOUNTER — Encounter (HOSPITAL_COMMUNITY): Payer: Self-pay | Admitting: *Deleted

## 2017-07-02 ENCOUNTER — Other Ambulatory Visit (INDEPENDENT_AMBULATORY_CARE_PROVIDER_SITE_OTHER): Payer: Medicare Other

## 2017-07-02 DIAGNOSIS — D649 Anemia, unspecified: Secondary | ICD-10-CM | POA: Diagnosis not present

## 2017-07-02 LAB — CBC WITH DIFFERENTIAL/PLATELET
BASOS PCT: 1.2 % (ref 0.0–3.0)
Basophils Absolute: 0.1 10*3/uL (ref 0.0–0.1)
Eosinophils Absolute: 0.1 10*3/uL (ref 0.0–0.7)
Eosinophils Relative: 1.5 % (ref 0.0–5.0)
HEMATOCRIT: 31.9 % — AB (ref 39.0–52.0)
Hemoglobin: 10.5 g/dL — ABNORMAL LOW (ref 13.0–17.0)
LYMPHS ABS: 0.8 10*3/uL (ref 0.7–4.0)
LYMPHS PCT: 10.9 % — AB (ref 12.0–46.0)
MCHC: 33 g/dL (ref 30.0–36.0)
MCV: 90.8 fl (ref 78.0–100.0)
MONOS PCT: 5.5 % (ref 3.0–12.0)
Monocytes Absolute: 0.4 10*3/uL (ref 0.1–1.0)
NEUTROS ABS: 6.2 10*3/uL (ref 1.4–7.7)
NEUTROS PCT: 80.9 % — AB (ref 43.0–77.0)
PLATELETS: 299 10*3/uL (ref 150.0–400.0)
RBC: 3.51 Mil/uL — ABNORMAL LOW (ref 4.22–5.81)
RDW: 14.1 % (ref 11.5–15.5)
WBC: 7.7 10*3/uL (ref 4.0–10.5)

## 2017-07-02 LAB — BASIC METABOLIC PANEL
BUN: 18 mg/dL (ref 6–23)
CHLORIDE: 105 meq/L (ref 96–112)
CO2: 26 meq/L (ref 19–32)
Calcium: 9.3 mg/dL (ref 8.4–10.5)
Creatinine, Ser: 1.13 mg/dL (ref 0.40–1.50)
GFR: 65.55 mL/min (ref >60.00)
GLUCOSE: 132 mg/dL — AB (ref 70–99)
Potassium: 4 mEq/L (ref 3.5–5.1)
SODIUM: 139 meq/L (ref 135–145)

## 2017-07-02 LAB — IBC PANEL
Iron: 40 ug/dL — ABNORMAL LOW (ref 42–165)
Saturation Ratios: 9.4 % — ABNORMAL LOW (ref 20.0–50.0)
TRANSFERRIN: 303 mg/dL (ref 212.0–360.0)

## 2017-07-04 ENCOUNTER — Ambulatory Visit (HOSPITAL_COMMUNITY): Payer: Medicare Other | Admitting: Anesthesiology

## 2017-07-04 ENCOUNTER — Encounter (HOSPITAL_COMMUNITY)
Admission: RE | Disposition: A | Discharge status: Home / Self Care | Payer: Self-pay | Source: Ambulatory Visit | Attending: Gastroenterology

## 2017-07-04 ENCOUNTER — Ambulatory Visit (HOSPITAL_COMMUNITY)
Admission: RE | Admit: 2017-07-04 | Discharge: 2017-07-04 | Disposition: A | Discharge status: Home / Self Care | Payer: Medicare Other | Source: Ambulatory Visit | Attending: Gastroenterology | Admitting: Gastroenterology

## 2017-07-04 ENCOUNTER — Encounter (HOSPITAL_COMMUNITY): Payer: Self-pay

## 2017-07-04 DIAGNOSIS — Z7984 Long term (current) use of oral hypoglycemic drugs: Secondary | ICD-10-CM | POA: Insufficient documentation

## 2017-07-04 DIAGNOSIS — K621 Rectal polyp: Secondary | ICD-10-CM | POA: Diagnosis not present

## 2017-07-04 DIAGNOSIS — E119 Type 2 diabetes mellitus without complications: Secondary | ICD-10-CM | POA: Insufficient documentation

## 2017-07-04 DIAGNOSIS — M1990 Unspecified osteoarthritis, unspecified site: Secondary | ICD-10-CM | POA: Insufficient documentation

## 2017-07-04 DIAGNOSIS — C160 Malignant neoplasm of cardia: Secondary | ICD-10-CM | POA: Insufficient documentation

## 2017-07-04 DIAGNOSIS — E039 Hypothyroidism, unspecified: Secondary | ICD-10-CM | POA: Diagnosis not present

## 2017-07-04 DIAGNOSIS — Z886 Allergy status to analgesic agent status: Secondary | ICD-10-CM | POA: Diagnosis not present

## 2017-07-04 DIAGNOSIS — C159 Malignant neoplasm of esophagus, unspecified: Secondary | ICD-10-CM

## 2017-07-04 DIAGNOSIS — Z882 Allergy status to sulfonamides status: Secondary | ICD-10-CM | POA: Diagnosis not present

## 2017-07-04 DIAGNOSIS — Z79899 Other long term (current) drug therapy: Secondary | ICD-10-CM | POA: Insufficient documentation

## 2017-07-04 DIAGNOSIS — Z888 Allergy status to other drugs, medicaments and biological substances status: Secondary | ICD-10-CM | POA: Diagnosis not present

## 2017-07-04 DIAGNOSIS — E785 Hyperlipidemia, unspecified: Secondary | ICD-10-CM | POA: Diagnosis not present

## 2017-07-04 DIAGNOSIS — K228 Other specified diseases of esophagus: Secondary | ICD-10-CM

## 2017-07-04 DIAGNOSIS — I10 Essential (primary) hypertension: Secondary | ICD-10-CM | POA: Diagnosis not present

## 2017-07-04 DIAGNOSIS — K2211 Ulcer of esophagus with bleeding: Secondary | ICD-10-CM | POA: Diagnosis not present

## 2017-07-04 HISTORY — PX: EUS: SHX5427

## 2017-07-04 HISTORY — DX: Anemia, unspecified: D64.9

## 2017-07-04 SURGERY — UPPER ENDOSCOPIC ULTRASOUND (EUS) RADIAL
Anesthesia: Monitor Anesthesia Care

## 2017-07-04 MED ORDER — LACTATED RINGERS IV SOLN
INTRAVENOUS | Status: DC
  Administered 2017-07-04: 11:00:00 via INTRAVENOUS

## 2017-07-04 MED ORDER — LIDOCAINE 2% (20 MG/ML) 5 ML SYRINGE
INTRAMUSCULAR | Status: DC | PRN
  Administered 2017-07-04: 80 mg via INTRAVENOUS

## 2017-07-04 MED ORDER — ONDANSETRON HCL 4 MG/2ML IJ SOLN
INTRAMUSCULAR | Status: AC
  Filled 2017-07-04: qty 2

## 2017-07-04 MED ORDER — SODIUM CHLORIDE 0.9 % IV SOLN: INTRAVENOUS | Status: DC

## 2017-07-04 MED ORDER — PROPOFOL 500 MG/50ML IV EMUL
INTRAVENOUS | Status: DC | PRN
  Administered 2017-07-04: 140 ug/kg/min via INTRAVENOUS

## 2017-07-04 MED ORDER — ONDANSETRON HCL 4 MG/2ML IJ SOLN: 4.0000 mg | Freq: Once | INTRAMUSCULAR | Status: DC | PRN

## 2017-07-04 MED ORDER — PROPOFOL 10 MG/ML IV BOLUS
INTRAVENOUS | Status: AC
  Filled 2017-07-04: qty 60

## 2017-07-04 MED ORDER — LIDOCAINE 2% (20 MG/ML) 5 ML SYRINGE
INTRAMUSCULAR | Status: AC
  Filled 2017-07-04: qty 5

## 2017-07-04 MED ORDER — PROPOFOL 10 MG/ML IV BOLUS
INTRAVENOUS | Status: DC | PRN
  Administered 2017-07-04 (×2): 20 mg via INTRAVENOUS

## 2017-07-04 MED ORDER — ONDANSETRON HCL 4 MG/2ML IJ SOLN
INTRAMUSCULAR | Status: DC | PRN
  Administered 2017-07-04: 4 mg via INTRAVENOUS

## 2017-07-04 MED ORDER — FENTANYL CITRATE (PF) 100 MCG/2ML IJ SOLN: 25.0000 ug | INTRAMUSCULAR | Status: DC | PRN

## 2017-07-04 NOTE — Discharge Instructions (Signed)

## 2017-07-04 NOTE — Anesthesia Preprocedure Evaluation (Addendum)
Anesthesia Evaluation  Patient identified by MRN, date of birth, ID band Patient awake    Reviewed: Allergy & Precautions, H&P , NPO status , Patient's Chart, lab work & pertinent test results  History of Anesthesia Complications Negative for: history of anesthetic complications  Airway Mallampati: II  TM Distance: >3 FB Neck ROM: full    Dental  (+) Upper Dentures, Lower Dentures   Pulmonary neg pulmonary ROS,    Pulmonary exam normal breath sounds clear to auscultation       Cardiovascular hypertension, Pt. on medications Normal cardiovascular exam Rhythm:regular Rate:Normal  ECG: SR with PAC's, rate 96   Neuro/Psych negative neurological ROS  negative psych ROS   GI/Hepatic Neg liver ROS, esophageal cancer Colon polyp   Endo/Other  diabetes, Well Controlled, Type 2, Oral Hypoglycemic AgentsHypothyroidism   Renal/GU negative Renal ROS     Musculoskeletal  (+) Arthritis , Osteoarthritis,    Abdominal   Peds  Hematology  (+) anemia ,   Anesthesia Other Findings HLD  Reproductive/Obstetrics                            Anesthesia Physical  Anesthesia Plan  ASA: III  Anesthesia Plan: MAC   Post-op Pain Management:    Induction: Intravenous  PONV Risk Score and Plan: 1 and Propofol infusion and Treatment may vary due to age or medical condition  Airway Management Planned: Natural Airway  Additional Equipment:   Intra-op Plan:   Post-operative Plan:   Informed Consent: I have reviewed the patients History and Physical, chart, labs and discussed the procedure including the risks, benefits and alternatives for the proposed anesthesia with the patient or authorized representative who has indicated his/her understanding and acceptance.   Dental advisory given  Plan Discussed with: CRNA  Anesthesia Plan Comments:         Anesthesia Quick Evaluation

## 2017-07-04 NOTE — Op Note (Signed)
[facility] Patient Name: Aaron Becker Procedure Date: 07/04/2017 MRN: [id number] Attending MD: Aaron Becker , MD Date of Birth: [birth year] CSN: [id number] Age: 83 Admit Type: Outpatient Procedure:                Upper EUS Indications:              Pre-treatment staging of esophageal adenocarcinoma;                            GE junction adenocarcinoma diagnosed by Dr. Allen Becker                            05/2017, recent PET scan shows no obvious metastatic                            disease Providers:                Aaron Banister, MD, Aaron Ramp. Tilden Dome, RN,                            Aaron Becker, Technician Referring MD:             Aaron Stalls, MD [facility]); Ollen Bowl, MD Medicines:                Monitored Anesthesia Care Complications:            No immediate complications. Estimated blood loss:                            None. Estimated Blood Loss:     Estimated blood loss: none. Procedure:                Pre-Anesthesia Assessment:                           - Prior to the procedure, a History and Physical                            was performed, and patient medications and                            allergies were reviewed. The patient's tolerance of                            previous anesthesia was also reviewed. The risks                            and benefits of the procedure and the sedation                            options and risks were discussed with the patient.                            All questions were  answered, and informed consent                            was obtained. Prior Anticoagulants: The patient has                            taken no previous anticoagulant or antiplatelet                            agents. ASA Grade Assessment: III - A patient with                            severe systemic disease. After reviewing the risks                            and benefits, the  patient was deemed in                            satisfactory condition to undergo the procedure.                           After obtaining informed consent, the endoscope was                            passed under direct vision. Throughout the                            procedure, the patient's blood pressure, pulse, and                            oxygen saturations were monitored continuously. The                            [id number] ([id number]) scope was introduced through                            the mouth, and advanced to the antrum of the                            stomach. The upper EUS was accomplished without                            difficulty. The patient tolerated the procedure                            well. Scope In: Scope Out: Findings:      Endoscopic Finding :      1. Ulcerated mass at the GE junction, partially circumferential and not       obstructive. Estimated endoscopic size 1-2cm.      Endosonographic Finding :      1. The mass above correlated with a hypoechoic, homogeneous lesion that       suggested a larger process that is endocopically apparent. The mass is       1cm in thickness and occupies 3/4 the  lumen circumference at the GE       junction. The mass clearly passes into but not through the muscularis       propria layer of the esophageal wall (uT2).      2. No paraesophageal adenopathy (uN0) Impression:               - The sonographic images suggest a lesion larger                            than is apparent by standard endoscopy.                           - uT2N0 GE junction adenocarcinoma which occupies                            3/4 the GE junction circumference and is 2-3cm in                            length (majority of the lesion is just below the GE                            junction by EUS). Moderate Sedation:      N/A- Per Anesthesia Care Recommendation:           - Discharge patient to home (ambulatory).                           -  Follow up with Dr. Gerrit Becker and previously                            planned. Procedure Code(s):        --- Professional ---                           [phone], 60, Esophagogastroduodenoscopy, flexible,                            transoral; with endoscopic ultrasound examination                            limited to the esophagus, stomach or duodenum, and                            adjacent structures Diagnosis Code(s):        --- Professional ---                           K22.8, Other specified diseases of esophagus                           C15.9, Malignant neoplasm of esophagus, unspecified CPT copyright 2016 [employer]. All rights reserved. The codes documented in this report are preliminary and upon coder review may  be revised to meet current compliance requirements. Aaron Banister, MD 07/04/2017 12:54:31 PM This report has been signed electronically. Number of Addenda: 0

## 2017-07-04 NOTE — Anesthesia Postprocedure Evaluation (Signed)
Anesthesia Post Note  Patient: Aaron Becker  Procedure(s) Performed: UPPER ENDOSCOPIC ULTRASOUND (EUS) RADIAL (N/A )     Patient location during evaluation: PACU Anesthesia Type: MAC Level of consciousness: awake and alert Pain management: pain level controlled Vital Signs Assessment: post-procedure vital signs reviewed and stable Respiratory status: spontaneous breathing, nonlabored ventilation, respiratory function stable and patient connected to nasal cannula oxygen Cardiovascular status: stable and blood pressure returned to baseline Postop Assessment: no apparent nausea or vomiting Anesthetic complications: no    Last Vitals:  Vitals:   07/04/17 1252 07/04/17 1300  BP: 116/66 140/63  Pulse: 64 64  Resp: 19 17  Temp:    SpO2: 100% 98%    Last Pain:  Vitals:   07/04/17 1245  TempSrc: Oral                 Tekesha Almgren P Loyal Rudy

## 2017-07-04 NOTE — H&P (View-Only) (Signed)
Primary Care Physician: Tonia Ghent, MD  Primary Gastroenterologist:  Dr. Lucilla Lame  Chief Complaint  Patient presents with  . Follow up EGD results    HPI: Aaron Becker is a 83 y.o. male here for follow-up after having upper endoscopy.  The patient and upper endoscopy due to GI bleeding.  The patient was found to have a ulcerated lesion in his distal esophagus.  This was biopsied and came back as poorly differentiated adenocarcinoma. The patient had been given this diagnosis yesterday but he 1, to discuss further his diagnosis and to have any of his questions answered.  Current Outpatient Prescriptions  Medication Sig Dispense Refill  . benazepril (LOTENSIN) 10 MG tablet TAKE 1 TABLET BY MOUTH EVERY DAY    . [employer] ALCOHOL SWABS PADS Use as instructed to test blood sugar once daily.  Diagnosis:  250.00  Non-insulin dependent.    . diphenhydrAMINE (BENADRYL) 25 mg capsule Take 25 mg by mouth at bedtime as needed for allergies. PM    . Docusate Calcium (STOOL SOFTENER PO) Take 1 tablet by mouth at bedtime as needed. For constipation    . folic acid (FOLVITE) 884 MCG tablet Take by mouth.    Marland Kitchen glimepiride (AMARYL) 1 MG tablet Take 2 tablets (2 mg total) by mouth daily with breakfast. 180 tablet 3  . glucose blood (ONE TOUCH ULTRA TEST) test strip TO TEST BLOOD SUGAR ONCE DAILY AND AS DIRECTED DX E11.9 100 each 3  . glyBURIDE-metformin (GLUCOVANCE) 2.5-500 MG tablet Take by mouth.    . hydrochlorothiazide (MICROZIDE) 12.5 MG capsule TAKE ONE CAPSULE BY MOUTH EVERY DAY    . ibuprofen (ADVIL,MOTRIN) 200 MG tablet Take 200 mg by mouth.    . levothyroxine (SYNTHROID, LEVOTHROID) 75 MCG tablet Take 1 tablet (75 mcg total) by mouth daily. 90 tablet 3  . Menthol, Topical Analgesic, 3.7 % GEL Apply topically.    . metFORMIN (GLUCOPHAGE) 500 MG tablet Take 1-2 tablets (500-1,000 mg total) by mouth 2 (two) times daily with a meal. 500 mg every morning and 1000 mg daily with supper (may take 1  extra tablet at night if needed)    . Multiple Vitamins-Minerals (CENTRUM SILVER PO) Take 1 tablet by mouth daily.     . Omega-3 Fatty Acids (FISH OIL PO) Take by mouth.    . pantoprazole (PROTONIX) 40 MG tablet Take 1 tablet (40 mg total) by mouth 2 (two) times daily. 60 tablet 1  . pravastatin (PRAVACHOL) 80 MG tablet Take 1 tablet (80 mg total) by mouth at bedtime.    . pyridOXINE (VITAMIN B-6) 100 MG tablet Take 100 mg by mouth daily.     . vitamin C (ASCORBIC ACID) 500 MG tablet Take 500 mg by mouth daily.      No current facility-administered medications for this visit.     Allergies as of 06/18/2017 - Review Complete 06/18/2017  Allergen Reaction Noted  . Flomax [tamsulosin hcl] Other (See Comments) 07/26/2014  . Ibuprofen Other (See Comments) 02/02/2015  . Rosuvastatin  01/23/2007  . Sulfa antibiotics Rash 07/07/2014  . Sulfonamide derivatives Rash 01/23/2007    ROS:  General: Negative for anorexia, weight loss, fever, chills, fatigue, weakness. ENT: Negative for hoarseness, difficulty swallowing , nasal congestion. CV: Negative for chest pain, angina, palpitations, dyspnea on exertion, peripheral edema.  Respiratory: Negative for dyspnea at rest, dyspnea on exertion, cough, sputum, wheezing.  GI: See history of present illness. GU:  Negative for dysuria, hematuria, urinary incontinence,  urinary frequency, nocturnal urination.  Endo: Negative for unusual weight change.    Physical Examination:   BP 121/63   Pulse 98   Ht 5\' 5"  (1.651 m)   Wt 148 lb (67.1 kg)   BMI 24.63 kg/m   General: Well-nourished, well-developed in no acute distress.  Eyes: No icterus. Conjunctivae pink. Extremities: No lower extremity edema. No clubbing or deformities. Neuro: Alert and oriented x 3.  Grossly intact. Skin: Warm and dry, no jaundice.   Psych: Alert and cooperative, normal mood and affect.  Labs:    Imaging Studies: Dg Chest Portable 1 View  Result Date:  06/14/2017 CLINICAL DATA:  Epigastric pain, bradycardia, GI bleed EXAM: PORTABLE CHEST 1 VIEW COMPARISON:  07/15/2014 FINDINGS: Normal heart size and vascularity. Lungs remain clear. No focal pneumonia, collapse or consolidation. Negative for edema, effusion or pneumothorax. Degenerative changes of the spine. Old healed left fourth rib fracture noted. Atherosclerosis of the aorta. No gross free air evident. IMPRESSION: No acute finding by plain radiography.  Stable exam. Electronically Signed   By: Jerilynn Mages.  Shick M.D.   On: 06/14/2017 17:29    Assessment and Plan:   Aaron Becker is a 83 y.o. y/o male who had an EGD for a GI bleed that showed him to have a ulcerated lesion in the esophagus at the GE junction that was poorly differentiated adenocarcinoma.  The patient has a visit set up with oncology.  The patient's diagnosis has been explained to him and his family.  The patient has had all his questions answered today.  They will contact me if they have any further questions.    Lucilla Lame, MD. Marval Regal   Note: This dictation was prepared with Dragon dictation along with smaller phrase technology. Any transcriptional errors that result from this process are unintentional.

## 2017-07-04 NOTE — Transfer of Care (Signed)
Immediate Anesthesia Transfer of Care Note  Patient: Aaron Becker  Procedure(s) Performed: UPPER ENDOSCOPIC ULTRASOUND (EUS) RADIAL (N/A )  Patient Location: Endoscopy Unit  Anesthesia Type:MAC  Level of Consciousness: sedated  Airway & Oxygen Therapy: Patient Spontanous Breathing and Patient connected to nasal cannula oxygen  Post-op Assessment: Report given to RN and Post -op Vital signs reviewed and stable  Post vital signs: Reviewed and stable  Last Vitals:  Vitals:   07/04/17 1122  BP: (!) 152/68  Pulse: 69  Resp: 19  Temp: 36.7 C  SpO2: 100%    Last Pain:  Vitals:   07/04/17 1122  TempSrc: Oral         Complications: No apparent anesthesia complications

## 2017-07-04 NOTE — Anesthesia Procedure Notes (Signed)
Date/Time: 07/04/2017 12:22 PM Performed by: Danley Danker L Oxygen Delivery Method: Nasal cannula

## 2017-07-04 NOTE — Interval H&P Note (Signed)
History and Physical Interval Note:  07/04/2017 12:10 PM  Aaron Becker  has presented today for surgery, with the diagnosis of esophageal cancer  The various methods of treatment have been discussed with the patient and family. After consideration of risks, benefits and other options for treatment, the patient has consented to  Procedure(s): UPPER ENDOSCOPIC ULTRASOUND (EUS) RADIAL (N/A) as a surgical intervention .  The patient's history has been reviewed, patient examined, no change in status, stable for surgery.  I have reviewed the patient's chart and labs.  Questions were answered to the patient's satisfaction.     [name]

## 2017-07-05 ENCOUNTER — Encounter (HOSPITAL_COMMUNITY): Payer: Self-pay | Admitting: Gastroenterology

## 2017-07-05 ENCOUNTER — Other Ambulatory Visit: Payer: Self-pay | Admitting: Family Medicine

## 2017-07-05 ENCOUNTER — Ambulatory Visit: Payer: Medicare Other

## 2017-07-05 DIAGNOSIS — D509 Iron deficiency anemia, unspecified: Secondary | ICD-10-CM

## 2017-07-05 MED ORDER — FERROUS SULFATE 325 (65 FE) MG PO TABS: 325.0000 mg | ORAL_TABLET | Freq: Every day | ORAL | Status: DC

## 2017-07-09 ENCOUNTER — Ambulatory Visit: Payer: Medicare Other | Admitting: Hematology and Oncology

## 2017-07-10 ENCOUNTER — Ambulatory Visit (INDEPENDENT_AMBULATORY_CARE_PROVIDER_SITE_OTHER): Payer: Medicare Other

## 2017-07-10 DIAGNOSIS — Z23 Encounter for immunization: Secondary | ICD-10-CM | POA: Diagnosis not present

## 2017-07-10 NOTE — Progress Notes (Signed)
[facility] day:  07/11/2017  Chief Complaint: Aaron Becker is a 83 y.o. male with esophageal carcinoma who is seen for assessment following EUS and PET scan and to discuss direction of therapy  HPI:  The patient was last seen in the medical  oncology clinic on 06/20/2017 for an initial consultation regarding his newly diagnosed esophageal carcinoma. At that time, he was doing well. He was described by his daughter as being "very independent". He completes all of his ADLs independently.  Family noted that he was becoming increasingly weak since discharge from the hospital.  He denied any bleeding.  He had lost 10-15 pounds over the course of 6 months.  Patient had a dry hacking cough since leaving the hospital. There were no issues with swallowing.  He denied pain. Diagnosis, staging, and management of esophageal carcinoma was dicussed.  Due to concerns from previous experiences with his wife, patient was considering doing "nothing".  Patient is not a surgical candidate. He was set up for an EUS and PET scan for further evaluation.  PET scan on 06/27/2017 revealed no appreciable abnormal esophageal hypermetabolic activity to correspond with the reported tumor. There was no adenopathy or other findings of metastatic disease. There was question that the original tumor was either low-grade or very small.  Upper endoscopic ultrasound (EUS) was performed on 07/04/2017 by Dr. Owens Loffler.  Study revealed a 1-2 cm ulcerated mass at the GE junction that was partially circumferential and nonobstructive. The mass was 1 cm in thickness and occupied 3/4 of the  lumen circumference at the GE junction and is 2-3 cm in length. The mass clearly passes into, but not through the muscularis propria layer of the esophageal wall. There was no paraesophageal adenopathy.  Impression was  uT2N0 GE junction adenocarcinoma.  Symptomatically, he is doing well. He denies any  complaints today. Patient denies any shortness of breath or dysphagia.  He continues to work with physical therapy at home 3 times a week.  He is also receiving home health nursing services. Patient is eating well. He is has gained 3 pounds.    Past Medical History:  Diagnosis Date  . Anemia   . Arthritis    hands  . Bradycardia   . Colon polyp   . Diabetes mellitus, type II ([place]) 1986   oral meds  . Elevated PSA 2011   per Dr. Jacqlyn Larsen with Uro no problems last 5 or 6 yrs  . Esophageal cancer ([place]) dx sept 18 2018  . HOH (hard of hearing)    bilateral AIDES  . Hyperlipidemia   . Hypertension 02/02   off bp meds since hospitalization 3 weeks ago  . Hypothyroidism 1980's  . Palpitations   . Right rib fracture 2015  . SVT (supraventricular tachycardia) ([place])    a. reported SVT during admission at [facility] 06/2014  . Syncope and collapse    none recent last few weeks  . Wears dentures    upper and lower    Past Surgical History:  Procedure Laterality Date  . [place], 09/2015   COMPRESSED FRACTURE SPINE  . carotid ultrasound  02/02   wnl  . CATARACT EXTRACTION W/PHACO Right 03/19/2016   Procedure: CATARACT EXTRACTION PHACO AND INTRAOCULAR LENS PLACEMENT (IOC);  Surgeon: Ronnell Freshwater, MD;  Location: [facility];  Service: Ophthalmology;  Laterality: Right;  DIABETIC-oral med RIGHT  . CATARACT EXTRACTION W/PHACO Left 04/23/2016   Procedure:  CATARACT EXTRACTION PHACO AND INTRAOCULAR LENS PLACEMENT (IOC);  Surgeon: Ronnell Freshwater, MD;  Location: [facility];  Service: Ophthalmology;  Laterality: Left;  LEFT DIABETIC - oral meds  . CHOLECYSTECTOMY  1967  . COLONOSCOPY W/ POLYPECTOMY  2012   Villous adenoma from the rectum without high-grade dysplasia, 10 mm  . COLONOSCOPY WITH PROPOFOL N/A 03/30/2015   Procedure: COLONOSCOPY WITH PROPOFOL;  Surgeon: Robert Bellow, MD;  Location: [facility] ENDOSCOPY;  Service: Endoscopy;  Laterality: N/A;  .  CYST EXCISION     thumb  . CYSTECTOMY  06/14/04   mucous cyst excision with left thumb, IP joint debridement  . ESOPHAGOGASTRODUODENOSCOPY (EGD) WITH PROPOFOL N/A 06/11/2017   Procedure: ESOPHAGOGASTRODUODENOSCOPY (EGD) WITH PROPOFOL;  Surgeon: Lucilla Lame, MD;  Location: [facility] ENDOSCOPY;  Service: Endoscopy;  Laterality: N/A;  . EUS N/A 07/04/2017   Procedure: UPPER ENDOSCOPIC ULTRASOUND (EUS) RADIAL;  Surgeon: [name], MD;  Location: [facility] ENDOSCOPY;  Service: Endoscopy;  Laterality: N/A;  . FLEXIBLE SIGMOIDOSCOPY N/A 06/11/2017   Procedure: FLEXIBLE SIGMOIDOSCOPY;  Surgeon: Lucilla Lame, MD;  Location: [facility] ENDOSCOPY;  Service: Endoscopy;  Laterality: N/A;  . HERNIA REPAIR  2013  . KYPHOPLASTY N/A 10/06/2015   Procedure: Thoracic twelve kyphoplasty;  Surgeon: Karie Chimera, MD;  Location: [facility] NEURO ORS;  Service: Neurosurgery;  Laterality: N/A;  T12 Kyphoplasty  . PROSTATE BIOPSY  11/01/08   Dr. Reece Agar  . [place]  . VASECTOMY  1975    Family History  Problem Relation Age of Onset  . Cancer Father        liver  . Cancer Sister        breast  . Cancer Brother        skin  . Cancer Sister        breast  . Heart disease Sister        pacer  . Heart disease Sister   . Cancer Sister        breast cancer  . Heart disease Sister   . Colon cancer Neg Hx   . Prostate cancer Neg Hx     Social History:  reports that he has never smoked. He has never used smokeless tobacco. He reports that he does not drink alcohol or use drugs.  Retired Agricultural consultant; worked for [employer] for 44 years.  He was in the [employer].  He denies any exposure to radiation or toxins.  The patient is accompanied by Mable Fill (son), Hortense Ramal (best friend), [name] (daughter), and Mariea Clonts (nurse navigator) today.  Allergies:  Allergies  Allergen Reactions  . Flomax [Tamsulosin Hcl] Other (See Comments)    syncope  . Ibuprofen Other (See Comments)    Oral  blisters at high doses  . Rosuvastatin     REACTION: muscle stiffness  . Sulfa Antibiotics Rash  . Sulfonamide Derivatives Rash    Current Medications: Current Outpatient Prescriptions  Medication Sig Dispense Refill  . acetaminophen (TYLENOL) 500 MG tablet Take 500-1,000 mg by mouth every 6 (six) hours as needed (for pain.).    Marland Kitchen Ascorbic Acid (VITAMIN C) 1000 MG tablet Take 1,000 mg by mouth daily.    . diphenhydrAMINE (BENADRYL) 25 mg capsule Take 25 mg by mouth at bedtime as needed (for sleep.).     Marland Kitchen docusate sodium (COLACE) 100 MG capsule Take 100 mg by mouth 2 (two) times daily as needed (for constipation).    . ferrous sulfate 325 (65 FE) MG  tablet Take 1 tablet (325 mg total) by mouth daily with breakfast.    . folic acid (FOLVITE) 350 MCG tablet Take 400 mcg by mouth daily with lunch.     . glimepiride (AMARYL) 1 MG tablet Take 2 tablets (2 mg total) by mouth daily with breakfast. 180 tablet 3  . glucose blood (ONE TOUCH ULTRA TEST) test strip TO TEST BLOOD SUGAR ONCE OR TWICE DAILY AND AS DIRECTED DX E11.9.  H/o variable blood sugars. (Patient taking differently: 1 each by Other route 2 (two) times daily. TO TEST BLOOD SUGAR ONCE OR TWICE DAILY AND AS DIRECTED DX E11.9.  H/o variable blood sugars.) 200 each 3  . levothyroxine (SYNTHROID, LEVOTHROID) 75 MCG tablet Take 1 tablet (75 mcg total) by mouth daily. (Patient taking differently: Take 75 mcg by mouth daily before breakfast. ) 90 tablet 3  . metFORMIN (GLUCOPHAGE) 500 MG tablet Take 1-2 tablets (500-1,000 mg total) by mouth 2 (two) times daily with a meal. 500 mg every morning and 1000 mg daily with supper (may take 1 extra tablet at night if needed)    . Multiple Vitamin (MULTIVITAMIN WITH MINERALS) TABS tablet Take 1 tablet by mouth daily. Centrum Silver    . pantoprazole (PROTONIX) 40 MG tablet Take 1 tablet (40 mg total) by mouth 2 (two) times daily. 60 tablet 1  . pravastatin (PRAVACHOL) 80 MG tablet Take 1 tablet (80 mg  total) by mouth at bedtime.    . pyridOXINE (VITAMIN B-6) 100 MG tablet Take 100 mg by mouth 3 (three) times a week.      No current facility-administered medications for this visit.     Review of Systems:  GENERAL:  Feels "pretty good/not too bad".  Active in yard.  Ties to "stay busy".  No fevers or sweats. Weight up 3 pounds since last visit.  PERFORMANCE STATUS (ECOG):  2 HEENT:  Occupation related hearing loss.  Bilateral hearing aids. No visual changes, runny nose, sore throat, mouth sores or tenderness. Lungs: No shortness of breath.  No hemoptysis. Cardiac:  No chest pain, palpitations, orthopnea, or PND. GI:  No swallowing difficulties.  No nausea, vomiting, diarrhea, constipation, melena or hematochezia. GU:  No urgency, frequency, dysuria, or hematuria. Musculoskeletal:  No back pain.  No joint pain.  No muscle tenderness. Extremities:  Arthritis in hands; joints stiff and "ache". No swelling. Skin:  No rashes or skin changes. Neuro:  No headache, numbness or weakness, balance or coordination issues. Endocrine:  Diabetes.  Hypothyroid on supplementation.  No hot flashes or night sweats. Psych:  No mood changes, depression or anxiety. Pain:  No focal pain. Review of systems:  All other systems reviewed and found to be negative.  Physical Exam: Blood pressure (!) 153/78, pulse 70, temperature 99.1 F (37.3 C), temperature source Tympanic, resp. rate 14, weight 152 lb (68.9 kg). GENERAL:  Thin elderly gentleman sitting comfortably in the exam room in no acute distress. MENTAL STATUS:  Alert and oriented to person, place and time. HEAD:  Gryy thin hair.  Normocephalic, atraumatic, face symmetric, no Cushingoid features. EYES:  Glasses.  Blue eyes. No conjunctivitis or scleral icterus. NEUROLOGICAL: Unremarkable. PSYCH:  Appropriate.   No visits with results within 3 Day(s) from this visit.  Latest known visit with results is:  Lab on 07/02/2017  Component Date Value Ref  Range Status  . Sodium 07/02/2017 139  135 - 145 mEq/L Final  . Potassium 07/02/2017 4.0  3.5 - 5.1 mEq/L Final  .  Chloride 07/02/2017 105  96 - 112 mEq/L Final  . CO2 07/02/2017 26  19 - 32 mEq/L Final  . Glucose, Bld 07/02/2017 132* 70 - 99 mg/dL Final  . BUN 07/02/2017 18  6 - 23 mg/dL Final  . Creatinine, Ser 07/02/2017 1.13  0.40 - 1.50 mg/dL Final  . Calcium 07/02/2017 9.3  8.4 - 10.5 mg/dL Final  . GFR 07/02/2017 65.55  >60.00 mL/min Final  . WBC 07/02/2017 7.7  4.0 - 10.5 K/uL Final  . RBC 07/02/2017 3.51* 4.22 - 5.81 Mil/uL Final  . Hemoglobin 07/02/2017 10.5* 13.0 - 17.0 g/dL Final  . HCT 07/02/2017 31.9* 39.0 - 52.0 % Final  . MCV 07/02/2017 90.8  78.0 - 100.0 fl Final  . MCHC 07/02/2017 33.0  30.0 - 36.0 g/dL Final  . RDW 07/02/2017 14.1  11.5 - 15.5 % Final  . Platelets 07/02/2017 299.0  150.0 - 400.0 K/uL Final  . Neutrophils Relative % 07/02/2017 80.9* 43.0 - 77.0 % Final  . Lymphocytes Relative 07/02/2017 10.9* 12.0 - 46.0 % Final  . Monocytes Relative 07/02/2017 5.5  3.0 - 12.0 % Final  . Eosinophils Relative 07/02/2017 1.5  0.0 - 5.0 % Final  . Basophils Relative 07/02/2017 1.2  0.0 - 3.0 % Final  . Neutro Abs 07/02/2017 6.2  1.4 - 7.7 K/uL Final  . Lymphs Abs 07/02/2017 0.8  0.7 - 4.0 K/uL Final  . Monocytes Absolute 07/02/2017 0.4  0.1 - 1.0 K/uL Final  . Eosinophils Absolute 07/02/2017 0.1  0.0 - 0.7 K/uL Final  . Basophils Absolute 07/02/2017 0.1  0.0 - 0.1 K/uL Final  . Iron 07/02/2017 40* 42 - 165 ug/dL Final  . Transferrin 07/02/2017 303.0  212.0 - 360.0 mg/dL Final  . Saturation Ratios 07/02/2017 9.4* 20.0 - 50.0 % Final    Assessment:  Aaron Becker is a 83 y.o. male with clinical stage II esophageal cancer (uT2N0).  He presented with an upper GI bleed.  EGD on 06/11/2017 revealed one cratered esophageal ulcer and stigmata of recent esophogeal bleeding at the GE junction.  Biopsy revealed poorly differentiated adenocarcinoma, signet ring type.  There was  one non-bleeding cratered gastric ulcer with no stigmata of bleeding in the gastric antrum.  Duodenum was normal.  Colonoscopy on 03/30/2015 revealed a 20 mm tubulovillous adenoma of the rectum.  Flexible sigmoidoscopy on 06/01/2017 revealed a sessile polyp in the rectum and grade II non-bleeding internal hemorrhoids.  PET scan  on 06/27/2017 that revealed no appreciable abnormal esophageal hypermetabolic activity to correspond with the reported tumor. There was no adenopathy or other findings of metastatic disease. There was question that the original tumor is either low-grade or very small.  Upper endoscopic ultrasound (EUS) on 07/04/2017 revealed a 1-2cm ulcerated mass at the GE junction that was partially circumferential and nonobstructive. The mass is 1 cm in thickness and occupies 3/4 of the  lumen circumference at the GE junction. The mass clearly passes into, but not through the muscularis propria layer of the esophageal wall. There was no paraesophageal adenopathy. Clinical stage was uT2N0 GE junction adenocarcinoma.  Symptomatically, patient is doing well.  he denies any dysphagia.  He denies any melena or hematochezia.  Exam is unremarkable.  Plan: 1.  Review PET scan - no appreciable abnormal esophageal hypermetabolic activity to correspond with the reported tumor. There was no adenopathy or other findings of metastatic disease.  2.  Discuss EUS findings. There was a 1-2cm ulcerated mass at the GE junction that  was partially circumferential and nonobstructive.  Impression: uT2N0 GE junction adenocarcinoma. 3.  Discuss treatment options.  Patient is not a surgical candidate.  Discuss concurrent chemotherapy and radiation versus palliative radiation.  Definitive chemotherapy options include cisplatin + 5FU, oxaliplatin + 5FU, and weekly carboplatin + Taxol.  Patient would not tolerate cisplatin or continuous infusion 5FU x 33 days (consider substituting Xeloda). Discuss weekly carboplatin and  Taxol x 6.  Side effects reviewed. Patient and family provided information. Patient verbalizes understanding of education and indicates that he would like to move forward with concurrent chemotherapy and radiation option. Printed information provided on the anticipated medications (carboplatin and Taxol) today for further review.   Patient would like treatment via PIV rather than port-a-cath.  Nursing to assess veins. 4.  Refer to radiation oncology for evaluation and treatment.  5.  Schedule chemotherapy class.  6.  Preauthorize carboplatin and Taxol.  7.  RTC on 07/19/2017 for MD assessment, labs (CBC with diff, CMP, Mg), and week # 1 of carboplatin + Taxol (new). Will coordinate with radiation oncology.    Honor Loh, NP  07/11/2017, 3:08 PM   I saw and evaluated the patient, participating in the key portions of the service and reviewing pertinent diagnostic studies and records.  I reviewed the nurse practitioner's note and agree with the findings and the plan.  The assessment and plan were discussed with the patient.  Multiple questions were asked by the patient and his family and answered.   Nolon Stalls, MD 07/11/2017, 3:08 PM

## 2017-07-11 ENCOUNTER — Encounter: Payer: Self-pay | Admitting: Hematology and Oncology

## 2017-07-11 ENCOUNTER — Other Ambulatory Visit: Payer: Self-pay | Admitting: Hematology and Oncology

## 2017-07-11 ENCOUNTER — Inpatient Hospital Stay: Payer: Medicare Other | Attending: Hematology and Oncology | Admitting: Hematology and Oncology

## 2017-07-11 VITALS — BP 153/78 | HR 70 | Temp 99.1°F | Resp 14 | Wt 152.0 lb

## 2017-07-11 DIAGNOSIS — Z808 Family history of malignant neoplasm of other organs or systems: Secondary | ICD-10-CM

## 2017-07-11 DIAGNOSIS — K259 Gastric ulcer, unspecified as acute or chronic, without hemorrhage or perforation: Secondary | ICD-10-CM | POA: Diagnosis not present

## 2017-07-11 DIAGNOSIS — I10 Essential (primary) hypertension: Secondary | ICD-10-CM

## 2017-07-11 DIAGNOSIS — R05 Cough: Secondary | ICD-10-CM | POA: Diagnosis not present

## 2017-07-11 DIAGNOSIS — E119 Type 2 diabetes mellitus without complications: Secondary | ICD-10-CM | POA: Insufficient documentation

## 2017-07-11 DIAGNOSIS — Z803 Family history of malignant neoplasm of breast: Secondary | ICD-10-CM

## 2017-07-11 DIAGNOSIS — Z7984 Long term (current) use of oral hypoglycemic drugs: Secondary | ICD-10-CM | POA: Diagnosis not present

## 2017-07-11 DIAGNOSIS — R634 Abnormal weight loss: Secondary | ICD-10-CM | POA: Diagnosis not present

## 2017-07-11 DIAGNOSIS — M1990 Unspecified osteoarthritis, unspecified site: Secondary | ICD-10-CM | POA: Diagnosis not present

## 2017-07-11 DIAGNOSIS — E785 Hyperlipidemia, unspecified: Secondary | ICD-10-CM | POA: Insufficient documentation

## 2017-07-11 DIAGNOSIS — C159 Malignant neoplasm of esophagus, unspecified: Secondary | ICD-10-CM | POA: Insufficient documentation

## 2017-07-11 DIAGNOSIS — Z882 Allergy status to sulfonamides status: Secondary | ICD-10-CM

## 2017-07-11 DIAGNOSIS — C160 Malignant neoplasm of cardia: Secondary | ICD-10-CM | POA: Diagnosis not present

## 2017-07-11 DIAGNOSIS — E039 Hypothyroidism, unspecified: Secondary | ICD-10-CM | POA: Diagnosis not present

## 2017-07-11 DIAGNOSIS — R531 Weakness: Secondary | ICD-10-CM | POA: Insufficient documentation

## 2017-07-11 DIAGNOSIS — Z86010 Personal history of colonic polyps: Secondary | ICD-10-CM | POA: Diagnosis not present

## 2017-07-11 DIAGNOSIS — Z79899 Other long term (current) drug therapy: Secondary | ICD-10-CM | POA: Diagnosis not present

## 2017-07-11 NOTE — Progress Notes (Signed)
START ON PATHWAY REGIMEN - Gastroesophageal     Administer weekly during RT:     Paclitaxel      Carboplatin   **Always confirm dose/schedule in your pharmacy ordering system**    Patient Characteristics: Esophageal & GE Junction, Adenocarcinoma, Preoperative or Nonsurgical Candidate (Clinical Staging), cT1 - cT2, cN0, Unresectable/Nonsurgical Candidate Histology: Adenocarcinoma Disease Classification: GE Junction Therapeutic Status: Preoperative or Nonsurgical Candidate (Clinical Staging) AJCC Grade: G3 AJCC 8 Stage Grouping: IIB AJCC T Category: cT2 AJCC N Category: cN0 AJCC M Category: cM0 Patient Characteristics: Unresectable/Nonsurgical Candidate Intent of Therapy: Curative Intent, Discussed with Patient

## 2017-07-11 NOTE — Patient Instructions (Signed)
Carboplatin injection What is this medicine? CARBOPLATIN (KAR boe pla tin) is a chemotherapy drug. It targets fast dividing cells, like cancer cells, and causes these cells to die. This medicine is used to treat ovarian cancer and many other cancers. This medicine may be used for other purposes; ask your health care provider or pharmacist if you have questions. COMMON BRAND NAME(S): Paraplatin What should I tell my health care provider before I take this medicine? They need to know if you have any of these conditions: -blood disorders -hearing problems -kidney disease -recent or ongoing radiation therapy -an unusual or allergic reaction to carboplatin, cisplatin, other chemotherapy, other medicines, foods, dyes, or preservatives -pregnant or trying to get pregnant -breast-feeding How should I use this medicine? This drug is usually given as an infusion into a vein. It is administered in a hospital or clinic by a specially trained health care professional. Talk to your pediatrician regarding the use of this medicine in children. Special care may be needed. Overdosage: If you think you have taken too much of this medicine contact a poison control center or emergency room at once. NOTE: This medicine is only for you. Do not share this medicine with others. What if I miss a dose? It is important not to miss a dose. Call your doctor or health care professional if you are unable to keep an appointment. What may interact with this medicine? -medicines for seizures -medicines to increase blood counts like filgrastim, pegfilgrastim, sargramostim -some antibiotics like amikacin, gentamicin, neomycin, streptomycin, tobramycin -vaccines Talk to your doctor or health care professional before taking any of these medicines: -acetaminophen -aspirin -ibuprofen -ketoprofen -naproxen This list may not describe all possible interactions. Give your health care provider a list of all the medicines, herbs,  non-prescription drugs, or dietary supplements you use. Also tell them if you smoke, drink alcohol, or use illegal drugs. Some items may interact with your medicine. What should I watch for while using this medicine? Your condition will be monitored carefully while you are receiving this medicine. You will need important blood work done while you are taking this medicine. This drug may make you feel generally unwell. This is not uncommon, as chemotherapy can affect healthy cells as well as cancer cells. Report any side effects. Continue your course of treatment even though you feel ill unless your doctor tells you to stop. In some cases, you may be given additional medicines to help with side effects. Follow all directions for their use. Call your doctor or health care professional for advice if you get a fever, chills or sore throat, or other symptoms of a cold or flu. Do not treat yourself. This drug decreases your body's ability to fight infections. Try to avoid being around people who are sick. This medicine may increase your risk to bruise or bleed. Call your doctor or health care professional if you notice any unusual bleeding. Be careful brushing and flossing your teeth or using a toothpick because you may get an infection or bleed more easily. If you have any dental work done, tell your dentist you are receiving this medicine. Avoid taking products that contain aspirin, acetaminophen, ibuprofen, naproxen, or ketoprofen unless instructed by your doctor. These medicines may hide a fever. Do not become pregnant while taking this medicine. Women should inform their doctor if they wish to become pregnant or think they might be pregnant. There is a potential for serious side effects to an unborn child. Talk to your health care professional or   pharmacist for more information. Do not breast-feed an infant while taking this medicine. What side effects may I notice from receiving this medicine? Side effects  that you should report to your doctor or health care professional as soon as possible: -allergic reactions like skin rash, itching or hives, swelling of the face, lips, or tongue -signs of infection - fever or chills, cough, sore throat, pain or difficulty passing urine -signs of decreased platelets or bleeding - bruising, pinpoint red spots on the skin, black, tarry stools, nosebleeds -signs of decreased red blood cells - unusually weak or tired, fainting spells, lightheadedness -breathing problems -changes in hearing -changes in vision -chest pain -high blood pressure -low blood counts - This drug may decrease the number of white blood cells, red blood cells and platelets. You may be at increased risk for infections and bleeding. -nausea and vomiting -pain, swelling, redness or irritation at the injection site -pain, tingling, numbness in the hands or feet -problems with balance, talking, walking -trouble passing urine or change in the amount of urine Side effects that usually do not require medical attention (report to your doctor or health care professional if they continue or are bothersome): -hair loss -loss of appetite -metallic taste in the mouth or changes in taste This list may not describe all possible side effects. Call your doctor for medical advice about side effects. You may report side effects to FDA at 1-800-FDA-1088. Where should I keep my medicine? This drug is given in a hospital or clinic and will not be stored at home. NOTE: This sheet is a summary. It may not cover all possible information. If you have questions about this medicine, talk to your doctor, pharmacist, or health care provider.  2018 Elsevier/Gold Standard (2007-12-16 14:38:05) Paclitaxel injection What is this medicine? PACLITAXEL (PAK li TAX el) is a chemotherapy drug. It targets fast dividing cells, like cancer cells, and causes these cells to die. This medicine is used to treat ovarian cancer, breast  cancer, and other cancers. This medicine may be used for other purposes; ask your health care provider or pharmacist if you have questions. COMMON BRAND NAME(S): Onxol, Taxol What should I tell my health care provider before I take this medicine? They need to know if you have any of these conditions: -blood disorders -irregular heartbeat -infection (especially a virus infection such as chickenpox, cold sores, or herpes) -liver disease -previous or ongoing radiation therapy -an unusual or allergic reaction to paclitaxel, alcohol, polyoxyethylated castor oil, other chemotherapy agents, other medicines, foods, dyes, or preservatives -pregnant or trying to get pregnant -breast-feeding How should I use this medicine? This drug is given as an infusion into a vein. It is administered in a hospital or clinic by a specially trained health care professional. Talk to your pediatrician regarding the use of this medicine in children. Special care may be needed. Overdosage: If you think you have taken too much of this medicine contact a poison control center or emergency room at once. NOTE: This medicine is only for you. Do not share this medicine with others. What if I miss a dose? It is important not to miss your dose. Call your doctor or health care professional if you are unable to keep an appointment. What may interact with this medicine? Do not take this medicine with any of the following medications: -disulfiram -metronidazole This medicine may also interact with the following medications: -cyclosporine -diazepam -ketoconazole -medicines to increase blood counts like filgrastim, pegfilgrastim, sargramostim -other chemotherapy drugs like cisplatin,   doxorubicin, epirubicin, etoposide, teniposide, vincristine -quinidine -testosterone -vaccines -verapamil Talk to your doctor or health care professional before taking any of these  medicines: -acetaminophen -aspirin -ibuprofen -ketoprofen -naproxen This list may not describe all possible interactions. Give your health care provider a list of all the medicines, herbs, non-prescription drugs, or dietary supplements you use. Also tell them if you smoke, drink alcohol, or use illegal drugs. Some items may interact with your medicine. What should I watch for while using this medicine? Your condition will be monitored carefully while you are receiving this medicine. You will need important blood work done while you are taking this medicine. This medicine can cause serious allergic reactions. To reduce your risk you will need to take other medicine(s) before treatment with this medicine. If you experience allergic reactions like skin rash, itching or hives, swelling of the face, lips, or tongue, tell your doctor or health care professional right away. In some cases, you may be given additional medicines to help with side effects. Follow all directions for their use. This drug may make you feel generally unwell. This is not uncommon, as chemotherapy can affect healthy cells as well as cancer cells. Report any side effects. Continue your course of treatment even though you feel ill unless your doctor tells you to stop. Call your doctor or health care professional for advice if you get a fever, chills or sore throat, or other symptoms of a cold or flu. Do not treat yourself. This drug decreases your body's ability to fight infections. Try to avoid being around people who are sick. This medicine may increase your risk to bruise or bleed. Call your doctor or health care professional if you notice any unusual bleeding. Be careful brushing and flossing your teeth or using a toothpick because you may get an infection or bleed more easily. If you have any dental work done, tell your dentist you are receiving this medicine. Avoid taking products that contain aspirin, acetaminophen, ibuprofen,  naproxen, or ketoprofen unless instructed by your doctor. These medicines may hide a fever. Do not become pregnant while taking this medicine. Women should inform their doctor if they wish to become pregnant or think they might be pregnant. There is a potential for serious side effects to an unborn child. Talk to your health care professional or pharmacist for more information. Do not breast-feed an infant while taking this medicine. Men are advised not to father a child while receiving this medicine. This product may contain alcohol. Ask your pharmacist or healthcare provider if this medicine contains alcohol. Be sure to tell all healthcare providers you are taking this medicine. Certain medicines, like metronidazole and disulfiram, can cause an unpleasant reaction when taken with alcohol. The reaction includes flushing, headache, nausea, vomiting, sweating, and increased thirst. The reaction can last from 30 minutes to several hours. What side effects may I notice from receiving this medicine? Side effects that you should report to your doctor or health care professional as soon as possible: -allergic reactions like skin rash, itching or hives, swelling of the face, lips, or tongue -low blood counts - This drug may decrease the number of white blood cells, red blood cells and platelets. You may be at increased risk for infections and bleeding. -signs of infection - fever or chills, cough, sore throat, pain or difficulty passing urine -signs of decreased platelets or bleeding - bruising, pinpoint red spots on the skin, black, tarry stools, nosebleeds -signs of decreased red blood cells - unusually weak or   tired, fainting spells, lightheadedness -breathing problems -chest pain -high or low blood pressure -mouth sores -nausea and vomiting -pain, swelling, redness or irritation at the injection site -pain, tingling, numbness in the hands or feet -slow or irregular heartbeat -swelling of the ankle,  feet, hands Side effects that usually do not require medical attention (report to your doctor or health care professional if they continue or are bothersome): -bone pain -complete hair loss including hair on your head, underarms, pubic hair, eyebrows, and eyelashes -changes in the color of fingernails -diarrhea -loosening of the fingernails -loss of appetite -muscle or joint pain -red flush to skin -sweating This list may not describe all possible side effects. Call your doctor for medical advice about side effects. You may report side effects to FDA at 1-800-FDA-1088. Where should I keep my medicine? This drug is given in a hospital or clinic and will not be stored at home. NOTE: This sheet is a summary. It may not cover all possible information. If you have questions about this medicine, talk to your doctor, pharmacist, or health care provider.  2018 Elsevier/Gold Standard (2015-07-12 19:58:00)  

## 2017-07-11 NOTE — Progress Notes (Signed)
Patient here for follow up with results. He states that he is feeling well today and denies having any pain.

## 2017-07-14 DIAGNOSIS — I10 Essential (primary) hypertension: Secondary | ICD-10-CM | POA: Diagnosis not present

## 2017-07-14 DIAGNOSIS — K921 Melena: Secondary | ICD-10-CM | POA: Diagnosis not present

## 2017-07-14 DIAGNOSIS — R195 Other fecal abnormalities: Secondary | ICD-10-CM | POA: Diagnosis not present

## 2017-07-14 DIAGNOSIS — E119 Type 2 diabetes mellitus without complications: Secondary | ICD-10-CM | POA: Diagnosis not present

## 2017-07-15 ENCOUNTER — Ambulatory Visit
Admission: RE | Admit: 2017-07-15 | Discharge: 2017-07-15 | Disposition: A | Discharge status: Home / Self Care | Payer: Medicare Other | Source: Ambulatory Visit | Attending: Radiation Oncology | Admitting: Radiation Oncology

## 2017-07-15 ENCOUNTER — Encounter: Payer: Self-pay | Admitting: Radiation Oncology

## 2017-07-15 VITALS — BP 164/85 | HR 60 | Temp 97.0°F | Wt 152.1 lb

## 2017-07-15 DIAGNOSIS — I10 Essential (primary) hypertension: Secondary | ICD-10-CM | POA: Insufficient documentation

## 2017-07-15 DIAGNOSIS — R001 Bradycardia, unspecified: Secondary | ICD-10-CM | POA: Diagnosis not present

## 2017-07-15 DIAGNOSIS — R972 Elevated prostate specific antigen [PSA]: Secondary | ICD-10-CM | POA: Diagnosis not present

## 2017-07-15 DIAGNOSIS — R1310 Dysphagia, unspecified: Secondary | ICD-10-CM | POA: Insufficient documentation

## 2017-07-15 DIAGNOSIS — R634 Abnormal weight loss: Secondary | ICD-10-CM | POA: Insufficient documentation

## 2017-07-15 DIAGNOSIS — K921 Melena: Secondary | ICD-10-CM | POA: Diagnosis not present

## 2017-07-15 DIAGNOSIS — C155 Malignant neoplasm of lower third of esophagus: Secondary | ICD-10-CM | POA: Insufficient documentation

## 2017-07-15 DIAGNOSIS — E785 Hyperlipidemia, unspecified: Secondary | ICD-10-CM | POA: Insufficient documentation

## 2017-07-15 DIAGNOSIS — Z8781 Personal history of (healed) traumatic fracture: Secondary | ICD-10-CM | POA: Diagnosis not present

## 2017-07-15 DIAGNOSIS — Z86010 Personal history of colonic polyps: Secondary | ICD-10-CM | POA: Insufficient documentation

## 2017-07-15 DIAGNOSIS — E119 Type 2 diabetes mellitus without complications: Secondary | ICD-10-CM | POA: Diagnosis not present

## 2017-07-15 DIAGNOSIS — Z79899 Other long term (current) drug therapy: Secondary | ICD-10-CM | POA: Insufficient documentation

## 2017-07-15 DIAGNOSIS — I471 Supraventricular tachycardia: Secondary | ICD-10-CM | POA: Diagnosis not present

## 2017-07-15 DIAGNOSIS — D649 Anemia, unspecified: Secondary | ICD-10-CM | POA: Diagnosis not present

## 2017-07-15 DIAGNOSIS — M129 Arthropathy, unspecified: Secondary | ICD-10-CM | POA: Diagnosis not present

## 2017-07-15 DIAGNOSIS — Z510 Encounter for antineoplastic radiation therapy: Secondary | ICD-10-CM | POA: Insufficient documentation

## 2017-07-15 DIAGNOSIS — Z800 Family history of malignant neoplasm of digestive organs: Secondary | ICD-10-CM | POA: Insufficient documentation

## 2017-07-15 DIAGNOSIS — Z7984 Long term (current) use of oral hypoglycemic drugs: Secondary | ICD-10-CM | POA: Diagnosis not present

## 2017-07-15 DIAGNOSIS — E039 Hypothyroidism, unspecified: Secondary | ICD-10-CM | POA: Diagnosis not present

## 2017-07-15 DIAGNOSIS — C159 Malignant neoplasm of esophagus, unspecified: Secondary | ICD-10-CM

## 2017-07-15 NOTE — Patient Instructions (Signed)

## 2017-07-15 NOTE — Consult Note (Signed)
NEW PATIENT EVALUATION  Name: Aaron Becker  MRN: [id number]  Date:   07/15/2017     DOB: Jul 13, 1930   This 83 y.o. male patient presents to the clinic for initial evaluation of distal esophageal adenocarcinoma T2 N0 M0.  REFERRING PHYSICIAN: Tonia Ghent, MD  CHIEF COMPLAINT:  Chief Complaint  Patient presents with  . Esophageal Cancer    Initial Evaluation    DIAGNOSIS: The encounter diagnosis was Adenocarcinoma of esophagus ([place]).   PREVIOUS INVESTIGATIONS:  PET CT scan reviewed Pathology reports reviewed Clinical notes his including upper endoscopy reviewed  HPI: Patient is an 83 year old male who presented over the past several months with tarry stools some mild dysphagia as well as a 10-12 pound weight loss. He eventually was seen by GI and underwent upper endoscopy showing a 2 cm ulcerative mass at the GE junction partially some circumferential. Endoscopic ultrasound revealed this to be a T2 N0 lesion. There is also an ulcer in the gastric fundus. By endoscopic ultrasound tumor did enter and passed to the muscularis propria but not through it. No evidence of paraesophageal adenopathy was noted. Patient has been counseled by medical oncology. He is a nonsurgical candidate based on his age and overall general condition. Treatment plan is to treat with concurrent chemotherapy radiation. Carbotaxol be use with concurrent radiation. He is seen today for radiation oncology opinion. He is doing fairly well he states the tarry stools have subsided. He is asymptomatic at this point really having no significant dysphagia. PET CT was performed showing no significant hypermetabolic activity in the distal esophagus.  PLANNED TREATMENT REGIMEN: Concurrent chemoradiation with curative intent  PAST MEDICAL HISTORY:  has a past medical history of Anemia; Arthritis; Bradycardia; Colon polyp; Diabetes mellitus, type II ([place]) (1986); Elevated PSA (2011); Esophageal cancer ([place]) (dx sept 18  2018); HOH (hard of hearing); Hyperlipidemia; Hypertension (02/02); Hypothyroidism (1980's); Palpitations; Right rib fracture (2015); SVT (supraventricular tachycardia) ([place]); Syncope and collapse; and Wears dentures.    PAST SURGICAL HISTORY:  Past Surgical History:  Procedure Laterality Date  . [place], 09/2015   COMPRESSED FRACTURE SPINE  . carotid ultrasound  02/02   [facility]  . CATARACT EXTRACTION W/PHACO Right 03/19/2016   Procedure: CATARACT EXTRACTION PHACO AND INTRAOCULAR LENS PLACEMENT (IOC);  Surgeon: Ronnell Freshwater, MD;  Location: [facility];  Service: Ophthalmology;  Laterality: Right;  DIABETIC-oral med RIGHT  . CATARACT EXTRACTION W/PHACO Left 04/23/2016   Procedure: CATARACT EXTRACTION PHACO AND INTRAOCULAR LENS PLACEMENT (IOC);  Surgeon: Ronnell Freshwater, MD;  Location: [facility];  Service: Ophthalmology;  Laterality: Left;  LEFT DIABETIC - oral meds  . CHOLECYSTECTOMY  1967  . COLONOSCOPY W/ POLYPECTOMY  2012   Villous adenoma from the rectum without high-grade dysplasia, 10 mm  . COLONOSCOPY WITH PROPOFOL N/A 03/30/2015   Procedure: COLONOSCOPY WITH PROPOFOL;  Surgeon: Robert Bellow, MD;  Location[facility] END[facility]COPY;  Service: Endoscopy;  Laterality: N/A;  . CYST EXCISION     thumb  . CYSTECTOMY  06/14/04   mucous cyst excision with left thumb, IP joint debridement  . ESOPHAGOGASTRODUODENOSCOPY (EGD) WITH PROPOFOL N/A 06/11/2017   Procedure: ESOPHAGOGASTRODUODENOSCOPY (EGD) WITH PROPOFOL;  Surgeon: Lucilla Lame, MD;  Location: [facility] ENDOSCOPY;  Service: Endoscopy;  Laterality: N/A;  . EUS N/A 07/04/2017   Procedure: UPPER ENDOSCOPIC ULTRASOUND (EUS) RADIAL;  Surgeon: [name], MD;  Location: [facility] ENDOSCOPY;  Service: Endoscopy;  Laterality: N/A;  . FLEXIBLE SIGMOIDOSCOPY N/A 06/11/2017   Procedure:  FLEXIBLE SIGMOIDOSCOPY;  Surgeon: Lucilla Lame, MD;  Location: [facility] ENDOSCOPY;  Service: Endoscopy;  Laterality: N/A;  . HERNIA  REPAIR  2013  . KYPHOPLASTY N/A 10/06/2015   Procedure: Thoracic twelve kyphoplasty;  Surgeon: Karie Chimera, MD;  Location: [place] NEURO ORS;  Service: Neurosurgery;  Laterality: N/A;  T12 Kyphoplasty  . PROSTATE BIOPSY  11/01/08   Dr. Reece Agar  . [place]  . VASECTOMY  1975    FAMILY HISTORY: family history includes Cancer in his brother, father, sister, sister, and sister; Heart disease in his sister, sister, and sister.  SOCIAL HISTORY:  reports that he has never smoked. He has never used smokeless tobacco. He reports that he does not drink alcohol or use drugs.  ALLERGIES: Flomax [tamsulosin hcl]; Ibuprofen; Rosuvastatin; Sulfa antibiotics; and Sulfonamide derivatives  MEDICATIONS:  Current Outpatient Prescriptions  Medication Sig Dispense Refill  . acetaminophen (TYLENOL) 500 MG tablet Take 500-1,000 mg by mouth every 6 (six) hours as needed (for pain.).    Marland Kitchen Ascorbic Acid (VITAMIN C) 1000 MG tablet Take 1,000 mg by mouth daily.    . diphenhydrAMINE (BENADRYL) 25 mg capsule Take 25 mg by mouth at bedtime as needed (for sleep.).     Marland Kitchen docusate sodium (COLACE) 100 MG capsule Take 100 mg by mouth 2 (two) times daily as needed (for constipation).    . ferrous sulfate 325 (65 FE) MG tablet Take 1 tablet (325 mg total) by mouth daily with breakfast.    . folic acid (FOLVITE) 400 MCG tablet Take 400 mcg by mouth daily with lunch.     . glimepiride (AMARYL) 1 MG tablet Take 2 tablets (2 mg total) by mouth daily with breakfast. 180 tablet 3  . glucose blood (ONE TOUCH ULTRA TEST) test strip TO TEST BLOOD SUGAR ONCE OR TWICE DAILY AND AS DIRECTED DX E11.9.  H/o variable blood sugars. (Patient taking differently: 1 each by Other route 2 (two) times daily. TO TEST BLOOD SUGAR ONCE OR TWICE DAILY AND AS DIRECTED DX E11.9.  H/o variable blood sugars.) 200 each 3  . levothyroxine (SYNTHROID, LEVOTHROID) 75 MCG tablet Take 1 tablet (75 mcg total) by mouth daily. (Patient taking  differently: Take 75 mcg by mouth daily before breakfast. ) 90 tablet 3  . metFORMIN (GLUCOPHAGE) 500 MG tablet Take 1-2 tablets (500-1,000 mg total) by mouth 2 (two) times daily with a meal. 500 mg every morning and 1000 mg daily with supper (may take 1 extra tablet at night if needed)    . Multiple Vitamin (MULTIVITAMIN WITH MINERALS) TABS tablet Take 1 tablet by mouth daily. Centrum Silver    . pantoprazole (PROTONIX) 40 MG tablet Take 1 tablet (40 mg total) by mouth 2 (two) times daily. 60 tablet 1  . pravastatin (PRAVACHOL) 80 MG tablet Take 1 tablet (80 mg total) by mouth at bedtime.    . pyridOXINE (VITAMIN B-6) 100 MG tablet Take 100 mg by mouth 3 (three) times a week.      No current facility-administered medications for this encounter.     ECOG PERFORMANCE STATUS:  0 - Asymptomatic  REVIEW OF SYSTEMS: Except for mild dysphasia weight loss and dark tarry stools Patient denies any weight loss, fatigue, weakness, fever, chills or night sweats. Patient denies any loss of vision, blurred vision. Patient denies any ringing  of the ears or hearing loss. No irregular heartbeat. Patient denies heart murmur or history of fainting. Patient denies any chest pain or pain radiating to her  upper extremities. Patient denies any shortness of breath, difficulty breathing at night, cough or hemoptysis. Patient denies any swelling in the lower legs. Patient denies any nausea vomiting, vomiting of blood, or coffee ground material in the vomitus. Patient denies any stomach pain. Patient states has had normal bowel movements no significant constipation or diarrhea. Patient denies any dysuria, hematuria or significant nocturia. Patient denies any problems walking, swelling in the joints or loss of balance. Patient denies any skin changes, loss of hair or loss of weight. Patient denies any excessive worrying or anxiety or significant depression. Patient denies any problems with insomnia. Patient denies excessive  thirst, polyuria, polydipsia. Patient denies any swollen glands, patient denies easy bruising or easy bleeding. Patient denies any recent infections, allergies or URI. Patient "s visual fields have not changed significantly in recent time.    PHYSICAL EXAM: BP (!) 164/85   Pulse 60   Temp (!) 97 F (36.1 C)   Wt 152 lb 1.9 oz (69 kg)   BMI 26.11 kg/m  Well-developed well-nourished patient in NAD. HEENT reveals PERLA, EOMI, discs not visualized.  Oral cavity is clear. No oral mucosal lesions are identified. Neck is clear without evidence of cervical or supraclavicular adenopathy. Lungs are clear to A&P. Cardiac examination is essentially unremarkable with regular rate and rhythm without murmur rub or thrill. Abdomen is benign with no organomegaly or masses noted. Motor sensory and DTR levels are equal and symmetric in the upper and lower extremities. Cranial nerves II through XII are grossly intact. Proprioception is intact. No peripheral adenopathy or edema is identified. No motor or sensory levels are noted. Crude visual fields are within normal range.  LABORATORY DATA: Pathology reports reviewed    RADIOLOGY RESULTS: PET CT scan reviewed and compatible with the above-stated findings   IMPRESSION: T2 N0 M0 adenocarcinoma the distal esophagus in 83 year old male for concurrent chemoradiation with curative intent  PLAN: Distal time I to go ahead with radiation therapy to his distal esophagus. I would plan on delivering 5400 cGy in 30 fractions. I would use I MRT treatment planning and delivery to spare critical structures such as small bowel spinal cord stomach. Risks and benefits of treatment including possible increased dysphasia during treatment nausea fatigue alteration of blood counts skin reaction all were described in detail. There will be extra effort by both professional staff as well as technical staff to coordinate and manage concurrent chemoradiation and ensuing side effects during  his treatments. I have personally set up and ordered CT simulation for later this week and will coordinate with his chemotherapy schedule. Patient and his family seem to comprehend my treatment plan well.  I would like to take this opportunity to thank you for allowing me to participate in the care of your patient.Marland Kitchen

## 2017-07-16 ENCOUNTER — Inpatient Hospital Stay: Payer: Medicare Other

## 2017-07-17 ENCOUNTER — Ambulatory Visit
Admission: RE | Admit: 2017-07-17 | Discharge: 2017-07-17 | Disposition: A | Discharge status: Home / Self Care | Payer: Medicare Other | Source: Ambulatory Visit | Attending: Radiation Oncology | Admitting: Radiation Oncology

## 2017-07-17 DIAGNOSIS — C155 Malignant neoplasm of lower third of esophagus: Secondary | ICD-10-CM | POA: Diagnosis not present

## 2017-07-18 ENCOUNTER — Ambulatory Visit: Payer: Medicare Other

## 2017-07-19 ENCOUNTER — Other Ambulatory Visit: Payer: Self-pay | Admitting: *Deleted

## 2017-07-19 ENCOUNTER — Inpatient Hospital Stay: Payer: Medicare Other

## 2017-07-19 ENCOUNTER — Telehealth: Payer: Self-pay | Admitting: *Deleted

## 2017-07-19 ENCOUNTER — Inpatient Hospital Stay (HOSPITAL_BASED_OUTPATIENT_CLINIC_OR_DEPARTMENT_OTHER): Payer: Medicare Other | Admitting: Hematology and Oncology

## 2017-07-19 VITALS — BP 164/81 | HR 58 | Temp 98.2°F | Resp 20 | Wt 149.2 lb

## 2017-07-19 DIAGNOSIS — E119 Type 2 diabetes mellitus without complications: Secondary | ICD-10-CM | POA: Diagnosis not present

## 2017-07-19 DIAGNOSIS — C159 Malignant neoplasm of esophagus, unspecified: Secondary | ICD-10-CM

## 2017-07-19 DIAGNOSIS — K259 Gastric ulcer, unspecified as acute or chronic, without hemorrhage or perforation: Secondary | ICD-10-CM

## 2017-07-19 DIAGNOSIS — Z882 Allergy status to sulfonamides status: Secondary | ICD-10-CM

## 2017-07-19 DIAGNOSIS — Z79899 Other long term (current) drug therapy: Secondary | ICD-10-CM | POA: Diagnosis not present

## 2017-07-19 DIAGNOSIS — I10 Essential (primary) hypertension: Secondary | ICD-10-CM

## 2017-07-19 DIAGNOSIS — R634 Abnormal weight loss: Secondary | ICD-10-CM

## 2017-07-19 DIAGNOSIS — Z7984 Long term (current) use of oral hypoglycemic drugs: Secondary | ICD-10-CM | POA: Diagnosis not present

## 2017-07-19 DIAGNOSIS — C160 Malignant neoplasm of cardia: Secondary | ICD-10-CM

## 2017-07-19 DIAGNOSIS — E785 Hyperlipidemia, unspecified: Secondary | ICD-10-CM

## 2017-07-19 DIAGNOSIS — M1990 Unspecified osteoarthritis, unspecified site: Secondary | ICD-10-CM

## 2017-07-19 DIAGNOSIS — Z7189 Other specified counseling: Secondary | ICD-10-CM

## 2017-07-19 DIAGNOSIS — E039 Hypothyroidism, unspecified: Secondary | ICD-10-CM

## 2017-07-19 LAB — COMPREHENSIVE METABOLIC PANEL
ALT: 20 U/L (ref 17–63)
AST: 26 U/L (ref 15–41)
Albumin: 3.9 g/dL (ref 3.5–5.0)
Alkaline Phosphatase: 66 U/L (ref 38–126)
Anion gap: 9 (ref 5–15)
BUN: 22 mg/dL — ABNORMAL HIGH (ref 6–20)
CO2: 25 mmol/L (ref 22–32)
Calcium: 9.4 mg/dL (ref 8.9–10.3)
Chloride: 103 mmol/L (ref 101–111)
Creatinine, Ser: 1.17 mg/dL (ref 0.61–1.24)
GFR calc Af Amer: >60 mL/min (ref >60)
GFR calc non Af Amer: 55 mL/min — ABNORMAL LOW (ref >60)
Glucose, Bld: 208 mg/dL — ABNORMAL HIGH (ref 65–99)
Potassium: 4 mmol/L (ref 3.5–5.1)
Sodium: 137 mmol/L (ref 135–145)
Total Bilirubin: 0.6 mg/dL (ref 0.3–1.2)
Total Protein: 7.4 g/dL (ref 6.5–8.1)

## 2017-07-19 LAB — CBC WITH DIFFERENTIAL/PLATELET
Basophils Absolute: 0.1 10*3/uL (ref 0–0.1)
Basophils Relative: 1 %
Eosinophils Absolute: 0.1 10*3/uL (ref 0–0.7)
Eosinophils Relative: 2 %
HCT: 33.5 % — ABNORMAL LOW (ref 40.0–52.0)
Hemoglobin: 11 g/dL — ABNORMAL LOW (ref 13.0–18.0)
Lymphocytes Relative: 9 %
Lymphs Abs: 0.6 10*3/uL — ABNORMAL LOW (ref 1.0–3.6)
MCH: 29.4 pg (ref 26.0–34.0)
MCHC: 32.9 g/dL (ref 32.0–36.0)
MCV: 89.2 fL (ref 80.0–100.0)
Monocytes Absolute: 0.6 10*3/uL (ref 0.2–1.0)
Monocytes Relative: 8 %
Neutro Abs: 5.6 10*3/uL (ref 1.4–6.5)
Neutrophils Relative %: 80 %
Platelets: 206 10*3/uL (ref 150–440)
RBC: 3.75 MIL/uL — ABNORMAL LOW (ref 4.40–5.90)
RDW: 14.3 % (ref 11.5–14.5)
WBC: 7 10*3/uL (ref 3.8–10.6)

## 2017-07-19 LAB — MAGNESIUM: Magnesium: 1.8 mg/dL (ref 1.7–2.4)

## 2017-07-19 MED ORDER — ONDANSETRON HCL 8 MG PO TABS: 8.0000 mg | ORAL_TABLET | Freq: Two times a day (BID) | ORAL | 1 refills | Status: DC | PRN

## 2017-07-19 NOTE — Telephone Encounter (Signed)
Called [name], patient's daughter, to inform her that patient's glucose is elevated.  Dr. Mike Gip has faxed PCP (Dr. Damita Dunnings) and asked that his plan include sliding scale insulin while patient is getting treatment because of decadron being used as premed.  Rodena Piety verbalized understanding.

## 2017-07-19 NOTE — Progress Notes (Signed)
[facility] day:  07/19/2017  Chief Complaint: Aaron Becker is a 83 y.o. male with esophageal carcinoma who is seen for assessment prior to week #1 carboplatin and Taxol with radiation.  HPI:  The patient was last seen in the medical  oncology clinic on 07/11/2017  For review of interval upper endoscopic ultrasound and PET scan.  Ultrasound stage was uT2N0 GE junction adenocarcinoma.  PET scan revealed no evidence of metastatic disease.  We discussed concurrent carboplatin and Taxol with radiation.  He was referred to radiation oncology.  He was seen by Dr Baruch Gouty on 07/15/2017.  Plan is to deliver 5400 cGy in 30 fractions.  He underwent CT simulation on 07/17/2017. Radiation is planned to start on 07/29/2017.  He attended chemotherapy class on 07/16/2017.  During the interim, patient has been doing "ok". He denies any physical complaints today. Patient denies any B symptoms or interval infections. He has not have any difficulties with his swallowing. He is eating well, despite a 3 pound weight loss since his last clinic visit.    Past Medical History:  Diagnosis Date  . Anemia   . Arthritis    hands  . Bradycardia   . Colon polyp   . Diabetes mellitus, type II ([place]) 1986   oral meds  . Elevated PSA 2011   per Dr. Jacqlyn Larsen with Uro no problems last 5 or 6 yrs  . Esophageal cancer ([place]) dx sept 18 2018  . HOH (hard of hearing)    bilateral AIDES  . Hyperlipidemia   . Hypertension 02/02   off bp meds since hospitalization 3 weeks ago  . Hypothyroidism 1980's  . Palpitations   . Right rib fracture 2015  . SVT (supraventricular tachycardia) ([place])    a. reported SVT during admission at [facility] 06/2014  . Syncope and collapse    none recent last few weeks  . Wears dentures    upper and lower    Past Surgical History:  Procedure Laterality Date  . [place], 09/2015   COMPRESSED FRACTURE SPINE  . carotid ultrasound  02/02   wnl   . CATARACT EXTRACTION W/PHACO Right 03/19/2016   Procedure: CATARACT EXTRACTION PHACO AND INTRAOCULAR LENS PLACEMENT (IOC);  Surgeon: Ronnell Freshwater, MD;  Location: [place];  Service: Ophthalmology;  Laterality: Right;  DIABETIC-oral med RIGHT  . CATARACT EXTRACTION W/PHACO Left 04/23/2016   Procedure: CATARACT EXTRACTION PHACO AND INTRAOCULAR LENS PLACEMENT (IOC);  Surgeon: Ronnell Freshwater, MD;  Location: [place];  Service: Ophthalmology;  Laterality: Left;  LEFT DIABETIC - oral meds  . CHOLECYSTECTOMY  1967  . COLONOSCOPY W/ POLYPECTOMY  2012   Villous adenoma from the rectum without high-grade dysplasia, 10 mm  . COLONOSCOPY WITH PROPOFOL N/A 03/30/2015   Procedure: COLONOSCOPY WITH PROPOFOL;  Surgeon: Robert Bellow, MD;  Location: [facility] ENDOSCOPY;  Service: Endoscopy;  Laterality: N/A;  . CYST EXCISION     thumb  . CYSTECTOMY  06/14/04   mucous cyst excision with left thumb, IP joint debridement  . ESOPHAGOGASTRODUODENOSCOPY (EGD) WITH PROPOFOL N/A 06/11/2017   Procedure: ESOPHAGOGASTRODUODENOSCOPY (EGD) WITH PROPOFOL;  Surgeon: Lucilla Lame, MD;  Location: [facility] ENDOSCOPY;  Service: Endoscopy;  Laterality: N/A;  . EUS N/A 07/04/2017   Procedure: UPPER ENDOSCOPIC ULTRASOUND (EUS) RADIAL;  Surgeon: [name], MD;  Location: [facility] ENDOSCOPY;  Service: Endoscopy;  Laterality: N/A;  . FLEXIBLE SIGMOIDOSCOPY N/A 06/11/2017   Procedure: FLEXIBLE SIGMOIDOSCOPY;  Surgeon: Lucilla Lame, MD;  Location: [facility] ENDOSCOPY;  Service: Endoscopy;  Laterality: N/A;  . HERNIA REPAIR  2013  . KYPHOPLASTY N/A 10/06/2015   Procedure: Thoracic twelve kyphoplasty;  Surgeon: Karie Chimera, MD;  Location: [place] NEURO ORS;  Service: Neurosurgery;  Laterality: N/A;  T12 Kyphoplasty  . PROSTATE BIOPSY  11/01/08   Dr. Reece Agar  . [place]  . VASECTOMY  1975    Family History  Problem Relation Age of Onset  . Cancer Father        liver  . Cancer  Sister        breast  . Cancer Brother        skin  . Cancer Sister        breast  . Heart disease Sister        pacer  . Heart disease Sister   . Cancer Sister        breast cancer  . Heart disease Sister   . Colon cancer Neg Hx   . Prostate cancer Neg Hx     Social History:  reports that he has never smoked. He has never used smokeless tobacco. He reports that he does not drink alcohol or use drugs.  Retired Agricultural consultant; worked for [employer] for 44 years.  He was in the [employer].  He denies any exposure to radiation or toxins.  The patient is accompanied by 3 children today.  Allergies:  Allergies  Allergen Reactions  . Flomax [Tamsulosin Hcl] Other (See Comments)    syncope  . Ibuprofen Other (See Comments)    Oral blisters at high doses  . Rosuvastatin     REACTION: muscle stiffness  . Sulfa Antibiotics Rash  . Sulfonamide Derivatives Rash    Current Medications: Current Outpatient Prescriptions  Medication Sig Dispense Refill  . acetaminophen (TYLENOL) 500 MG tablet Take 500-1,000 mg by mouth every 6 (six) hours as needed (for pain.).    Marland Kitchen Ascorbic Acid (VITAMIN C) 1000 MG tablet Take 1,000 mg by mouth daily.    . diphenhydrAMINE (BENADRYL) 25 mg capsule Take 25 mg by mouth at bedtime as needed (for sleep.).     Marland Kitchen docusate sodium (COLACE) 100 MG capsule Take 100 mg by mouth 2 (two) times daily as needed (for constipation).    . ferrous sulfate 325 (65 FE) MG tablet Take 1 tablet (325 mg total) by mouth daily with breakfast.    . folic acid (FOLVITE) 818 MCG tablet Take 400 mcg by mouth daily with lunch.     . glimepiride (AMARYL) 1 MG tablet Take 2 tablets (2 mg total) by mouth daily with breakfast. 180 tablet 3  . glucose blood (ONE TOUCH ULTRA TEST) test strip TO TEST BLOOD SUGAR ONCE OR TWICE DAILY AND AS DIRECTED DX E11.9.  H/o variable blood sugars. (Patient taking differently: 1 each by Other route 2 (two) times daily. TO TEST BLOOD SUGAR ONCE OR  TWICE DAILY AND AS DIRECTED DX E11.9.  H/o variable blood sugars.) 200 each 3  . levothyroxine (SYNTHROID, LEVOTHROID) 75 MCG tablet Take 1 tablet (75 mcg total) by mouth daily. (Patient taking differently: Take 75 mcg by mouth daily before breakfast. ) 90 tablet 3  . metFORMIN (GLUCOPHAGE) 500 MG tablet Take 1-2 tablets (500-1,000 mg total) by mouth 2 (two) times daily with a meal. 500 mg every morning and 1000 mg daily with supper (may take 1 extra tablet at night if needed)    . Multiple  Vitamin (MULTIVITAMIN WITH MINERALS) TABS tablet Take 1 tablet by mouth daily. Centrum Silver    . pantoprazole (PROTONIX) 40 MG tablet Take 1 tablet (40 mg total) by mouth 2 (two) times daily. 60 tablet 1  . pravastatin (PRAVACHOL) 80 MG tablet Take 1 tablet (80 mg total) by mouth at bedtime.    . pyridOXINE (VITAMIN B-6) 100 MG tablet Take 100 mg by mouth 3 (three) times a week.      No current facility-administered medications for this visit.     Review of Systems:  GENERAL:  Feels "ok".  Active in yard.  Ties to "stay busy".  No fevers or sweats. Weight down 3 pounds since last visit.  PERFORMANCE STATUS (ECOG):  2 HEENT:  Occupation related hearing loss.  Bilateral hearing aids. No visual changes, runny nose, sore throat, mouth sores or tenderness. Lungs: No shortness of breath.  No hemoptysis. Cardiac:  No chest pain, palpitations, orthopnea, or PND. GI:  No swallowing difficulties.  No nausea, vomiting, diarrhea, constipation, melena or hematochezia. GU:  No urgency, frequency, dysuria, or hematuria. Musculoskeletal:  No back pain.  No joint pain.  No muscle tenderness. Extremities:  Arthritis in hands; joints stiff and "ache". No swelling. Skin:  No rashes or skin changes. Neuro:  No headache, numbness or weakness, balance or coordination issues. Endocrine:  Diabetes.  Hypothyroid on supplementation.  No hot flashes or night sweats. Psych:  No mood changes, depression or anxiety. Pain:  No focal  pain. Review of systems:  All other systems reviewed and found to be negative.  Physical Exam: Blood pressure (!) 164/81, pulse (!) 58, temperature 98.2 F (36.8 C), temperature source Tympanic, resp. rate 20, weight 149 lb 4 oz (67.7 kg). GENERAL:  Thin elderly gentleman sitting comfortably in the exam room in no acute distress. MENTAL STATUS:  Alert and oriented to person, place and time. HEAD:  Pearline Cables thin hair.  Normocephalic, atraumatic, face symmetric, no Cushingoid features. EYES:  Glasses.  Blue eyes.  Pupils equal round and reactive to light and accomodation.  No conjunctivitis or scleral icterus. ENT:  Oropharynx clear without lesion.  Tongue normal.  Dentures.  Mucous membranes moist.  RESPIRATORY:  Clear to auscultation without rales, wheezes or rhonchi. CARDIOVASCULAR:  Regular rate and rhythm without murmur, rub or gallop. ABDOMEN:  Soft, non-tender, with active bowel sounds, and no hepatosplenomegaly.  No masses. SKIN:  No rashes, ulcers or lesions. EXTREMITIES: Arthritic nodules in hands. No edema, no skin discoloration or tenderness.  No palpable cords. LYMPH NODES: No palpable cervical, supraclavicular, axillary or inguinal adenopathy  NEUROLOGICAL: Unremarkable. PSYCH:  Appropriate.   Appointment on 07/19/2017  Component Date Value Ref Range Status  . WBC 07/19/2017 7.0  3.8 - 10.6 K/uL Final  . RBC 07/19/2017 3.75* 4.40 - 5.90 MIL/uL Final  . Hemoglobin 07/19/2017 11.0* 13.0 - 18.0 g/dL Final  . HCT 07/19/2017 33.5* 40.0 - 52.0 % Final  . MCV 07/19/2017 89.2  80.0 - 100.0 fL Final  . MCH 07/19/2017 29.4  26.0 - 34.0 pg Final  . MCHC 07/19/2017 32.9  32.0 - 36.0 g/dL Final  . RDW 07/19/2017 14.3  11.5 - 14.5 % Final  . Platelets 07/19/2017 206  150 - 440 K/uL Final  . Neutrophils Relative % 07/19/2017 80  % Final  . Neutro Abs 07/19/2017 5.6  1.4 - 6.5 K/uL Final  . Lymphocytes Relative 07/19/2017 9  % Final  . Lymphs Abs 07/19/2017 0.6* 1.0 - 3.6 K/uL Final  .  Monocytes Relative 07/19/2017 8  % Final  . Monocytes Absolute 07/19/2017 0.6  0.2 - 1.0 K/uL Final  . Eosinophils Relative 07/19/2017 2  % Final  . Eosinophils Absolute 07/19/2017 0.1  0 - 0.7 K/uL Final  . Basophils Relative 07/19/2017 1  % Final  . Basophils Absolute 07/19/2017 0.1  0 - 0.1 K/uL Final  . Sodium 07/19/2017 137  135 - 145 mmol/L Final  . Potassium 07/19/2017 4.0  3.5 - 5.1 mmol/L Final  . Chloride 07/19/2017 103  101 - 111 mmol/L Final  . CO2 07/19/2017 25  22 - 32 mmol/L Final  . Glucose, Bld 07/19/2017 208* 65 - 99 mg/dL Final  . BUN 07/19/2017 22* 6 - 20 mg/dL Final  . Creatinine, Ser 07/19/2017 1.17  0.61 - 1.24 mg/dL Final  . Calcium 07/19/2017 9.4  8.9 - 10.3 mg/dL Final  . Total Protein 07/19/2017 7.4  6.5 - 8.1 g/dL Final  . Albumin 07/19/2017 3.9  3.5 - 5.0 g/dL Final  . AST 07/19/2017 26  15 - 41 U/L Final  . ALT 07/19/2017 20  17 - 63 U/L Final  . Alkaline Phosphatase 07/19/2017 66  38 - 126 U/L Final  . Total Bilirubin 07/19/2017 0.6  0.3 - 1.2 mg/dL Final  . GFR calc non Af Amer 07/19/2017 55* >60 mL/min Final  . GFR calc Af Amer 07/19/2017 >60  >60 mL/min Final   Comment: (NOTE) The eGFR has been calculated using the CKD EPI equation. This calculation has not been validated in all clinical situations. eGFR's persistently <60 mL/min signify possible Chronic Kidney Disease.   . Anion gap 07/19/2017 9  5 - 15 Final  . Magnesium 07/19/2017 1.8  1.7 - 2.4 mg/dL Final    Assessment:  Aaron Becker is a 83 y.o. male with clinical stage II esophageal cancer (uT2N0).  He presented with an upper GI bleed.  EGD on 06/11/2017 revealed one cratered esophageal ulcer and stigmata of recent esophogeal bleeding at the GE junction.  Biopsy revealed poorly differentiated adenocarcinoma, signet ring type.  There was one non-bleeding cratered gastric ulcer with no stigmata of bleeding in the gastric antrum.  Duodenum was normal.  Colonoscopy on 03/30/2015 revealed a 20  mm tubulovillous adenoma of the rectum.  Flexible sigmoidoscopy on 06/01/2017 revealed a sessile polyp in the rectum and grade II non-bleeding internal hemorrhoids.  PET scan  on 06/27/2017 that revealed no appreciable abnormal esophageal hypermetabolic activity to correspond with the reported tumor. There was no adenopathy or other findings of metastatic disease. There was question that the original tumor is either low-grade or very small.  Upper endoscopic ultrasound (EUS) on 07/04/2017 revealed a 1-2cm ulcerated mass at the GE junction that was partially circumferential and nonobstructive. The mass is 1 cm in thickness and occupies 3/4 of the  lumen circumference at the GE junction. The mass clearly passes into, but not through the muscularis propria layer of the esophageal wall. There was no paraesophageal adenopathy. Clinical stage was uT2N0 GE junction adenocarcinoma.  Symptomatically, patient is doing well.  He denies any dysphagia.  He denies any melena or hematochezia.  Exam is unremarkable. Labs are stable.   Plan: 1.  Labs today:  CBC with diff, CMP, Mg. 2.  Follow up with radiation oncology next with for simulation. Scheduled to start radiation on 07/29/2017. 3.  Reschedule radiation appointments to earlier in the day. Schedule needs to be adjusted to have labs first, radiation, MD assessment, then chemotherapy. 4.  Discuss treatment with chemotherapy and radiation. Multiple questions asked and answered.  Reviewed side effects. Discuss Rx medications that will be sent in to control chemotherapy related side effects at home.  5.  RTC on 07/29/2017 for MD assessment, labs (CBC with diff, CMP, Mg), and week # 1 of carboplatin + Taxol (new). Coordinate with radiation oncology. Schedule weekly x 6.     Honor Loh, NP  07/19/2017, 9:34 AM   I saw and evaluated the patient, participating in the key portions of the service and reviewing pertinent diagnostic studies and records.  I reviewed the  nurse practitioner's note and agree with the findings and the plan.  The assessment and plan were discussed with the patient.  Multiple questions were asked by the patient and his family and answered.   Nolon Stalls, MD 07/19/2017, 9:34 AM

## 2017-07-19 NOTE — Telephone Encounter (Signed)
-----   Message from Lequita Asal, MD sent at 07/19/2017  9:58 AM EDT ----- Regarding: Call patient  Blood sugar is elevated.  Note that blood sugar will go with steroids (Decadron) used as a premed with treatment.  Blood sugar needs to be controlled with PCP plan if blood sugars go up with Decadron.  Some patient are on sliding scale insulin during that time.  M  ----- Message ----- From: Interface, Lab In [place] Sent: 07/19/2017   8:26 AM To: Lequita Asal, MD

## 2017-07-19 NOTE — Progress Notes (Signed)
Patient offers no complaints today.  Patient accompanied by his daughter and two sons today.

## 2017-07-20 ENCOUNTER — Telehealth: Payer: Self-pay | Admitting: Family Medicine

## 2017-07-20 NOTE — Telephone Encounter (Signed)
Call pt.  With his treatment, he may have sig changes in his sugar.  If he does have sig elevations, then let me know so we can make some med adjustments.  Thanks.

## 2017-07-20 NOTE — Telephone Encounter (Signed)
Copied from [place]. Topic: General - Other >> Jul 19, 2017 11:50 AM Conception Chancy, NT wrote: Reason for CRM: pt partner is wanting to know if there was a way they could cancel some of the appts in November and Dr. Damita Dunnings just discuss those appts with him when he comes in on Oct.31. These appts are interfering with his radiation and chemo appts. Pt partner would like Dr. Carole Civil nurse to contact her asap so she can get the radiation and chemo appts scheduled.

## 2017-07-20 NOTE — Telephone Encounter (Signed)
See other phone note to patient also.

## 2017-07-20 NOTE — Telephone Encounter (Signed)
I am not in the clinic on 07/24/17 to discuss with patient.  Please see what details you can get.  Thanks.

## 2017-07-21 ENCOUNTER — Encounter: Payer: Self-pay | Admitting: Hematology and Oncology

## 2017-07-22 DIAGNOSIS — C155 Malignant neoplasm of lower third of esophagus: Secondary | ICD-10-CM | POA: Diagnosis not present

## 2017-07-22 NOTE — Telephone Encounter (Signed)
Noted. Thanks. Agreed.  

## 2017-07-22 NOTE — Telephone Encounter (Signed)
Patient's partner advised.

## 2017-07-22 NOTE — Telephone Encounter (Signed)
Patient's partner says she had to cancel the appts that were scheduled in November because of his chemo/radiation schedule and they will reschedule his 6 mos FU appt sometime in December.

## 2017-07-24 ENCOUNTER — Other Ambulatory Visit (INDEPENDENT_AMBULATORY_CARE_PROVIDER_SITE_OTHER): Payer: Medicare Other

## 2017-07-24 DIAGNOSIS — D509 Iron deficiency anemia, unspecified: Secondary | ICD-10-CM | POA: Diagnosis not present

## 2017-07-24 LAB — CBC WITH DIFFERENTIAL/PLATELET
Basophils Absolute: 0.1 10*3/uL (ref 0.0–0.1)
Basophils Relative: 1.1 % (ref 0.0–3.0)
EOS ABS: 0.1 10*3/uL (ref 0.0–0.7)
EOS PCT: 0.8 % (ref 0.0–5.0)
HCT: 34.8 % — ABNORMAL LOW (ref 39.0–52.0)
Hemoglobin: 11.2 g/dL — ABNORMAL LOW (ref 13.0–17.0)
LYMPHS ABS: 0.6 10*3/uL — AB (ref 0.7–4.0)
Lymphocytes Relative: 9.4 % — ABNORMAL LOW (ref 12.0–46.0)
MCHC: 32.1 g/dL (ref 30.0–36.0)
MCV: 90.2 fl (ref 78.0–100.0)
MONO ABS: 0.4 10*3/uL (ref 0.1–1.0)
Monocytes Relative: 6.5 % (ref 3.0–12.0)
NEUTROS PCT: 82.2 % — AB (ref 43.0–77.0)
Neutro Abs: 5.6 10*3/uL (ref 1.4–7.7)
Platelets: 250 10*3/uL (ref 150.0–400.0)
RBC: 3.86 Mil/uL — AB (ref 4.22–5.81)
RDW: 14 % (ref 11.5–15.5)
WBC: 6.8 10*3/uL (ref 4.0–10.5)

## 2017-07-24 LAB — IBC PANEL
IRON: 40 ug/dL — AB (ref 42–165)
Saturation Ratios: 9 % — ABNORMAL LOW (ref 20.0–50.0)
TRANSFERRIN: 318 mg/dL (ref 212.0–360.0)

## 2017-07-25 ENCOUNTER — Ambulatory Visit
Admission: RE | Admit: 2017-07-25 | Discharge: 2017-07-25 | Disposition: A | Discharge status: Home / Self Care | Payer: Medicare Other | Source: Ambulatory Visit | Attending: Radiation Oncology | Admitting: Radiation Oncology

## 2017-07-28 DIAGNOSIS — E119 Type 2 diabetes mellitus without complications: Secondary | ICD-10-CM | POA: Diagnosis not present

## 2017-07-29 ENCOUNTER — Other Ambulatory Visit: Payer: Self-pay | Admitting: *Deleted

## 2017-07-29 ENCOUNTER — Inpatient Hospital Stay: Payer: Medicare Other

## 2017-07-29 ENCOUNTER — Ambulatory Visit: Payer: Medicare Other

## 2017-07-29 ENCOUNTER — Other Ambulatory Visit: Payer: Self-pay | Admitting: Hematology and Oncology

## 2017-07-29 ENCOUNTER — Encounter: Payer: Self-pay | Admitting: Hematology and Oncology

## 2017-07-29 ENCOUNTER — Inpatient Hospital Stay: Payer: Medicare Other | Attending: Hematology and Oncology | Admitting: Hematology and Oncology

## 2017-07-29 ENCOUNTER — Ambulatory Visit: Admission: RE | Admit: 2017-07-29 | Payer: Medicare Other | Source: Ambulatory Visit

## 2017-07-29 VITALS — BP 181/82 | HR 82 | Temp 97.6°F | Resp 18 | Wt 150.6 lb

## 2017-07-29 VITALS — BP 180/85 | HR 58 | Resp 18

## 2017-07-29 DIAGNOSIS — Z7984 Long term (current) use of oral hypoglycemic drugs: Secondary | ICD-10-CM | POA: Insufficient documentation

## 2017-07-29 DIAGNOSIS — H918X9 Other specified hearing loss, unspecified ear: Secondary | ICD-10-CM | POA: Diagnosis not present

## 2017-07-29 DIAGNOSIS — Z9221 Personal history of antineoplastic chemotherapy: Secondary | ICD-10-CM | POA: Insufficient documentation

## 2017-07-29 DIAGNOSIS — Z79899 Other long term (current) drug therapy: Secondary | ICD-10-CM | POA: Insufficient documentation

## 2017-07-29 DIAGNOSIS — Z803 Family history of malignant neoplasm of breast: Secondary | ICD-10-CM | POA: Diagnosis not present

## 2017-07-29 DIAGNOSIS — Z7189 Other specified counseling: Secondary | ICD-10-CM

## 2017-07-29 DIAGNOSIS — Z808 Family history of malignant neoplasm of other organs or systems: Secondary | ICD-10-CM | POA: Diagnosis not present

## 2017-07-29 DIAGNOSIS — K621 Rectal polyp: Secondary | ICD-10-CM | POA: Insufficient documentation

## 2017-07-29 DIAGNOSIS — E119 Type 2 diabetes mellitus without complications: Secondary | ICD-10-CM | POA: Insufficient documentation

## 2017-07-29 DIAGNOSIS — I10 Essential (primary) hypertension: Secondary | ICD-10-CM

## 2017-07-29 DIAGNOSIS — E785 Hyperlipidemia, unspecified: Secondary | ICD-10-CM | POA: Insufficient documentation

## 2017-07-29 DIAGNOSIS — K648 Other hemorrhoids: Secondary | ICD-10-CM | POA: Diagnosis not present

## 2017-07-29 DIAGNOSIS — Z882 Allergy status to sulfonamides status: Secondary | ICD-10-CM

## 2017-07-29 DIAGNOSIS — C160 Malignant neoplasm of cardia: Secondary | ICD-10-CM | POA: Diagnosis not present

## 2017-07-29 DIAGNOSIS — J392 Other diseases of pharynx: Secondary | ICD-10-CM | POA: Diagnosis not present

## 2017-07-29 DIAGNOSIS — R432 Parageusia: Secondary | ICD-10-CM | POA: Insufficient documentation

## 2017-07-29 DIAGNOSIS — Z86010 Personal history of colonic polyps: Secondary | ICD-10-CM | POA: Insufficient documentation

## 2017-07-29 DIAGNOSIS — R634 Abnormal weight loss: Secondary | ICD-10-CM | POA: Insufficient documentation

## 2017-07-29 DIAGNOSIS — C159 Malignant neoplasm of esophagus, unspecified: Secondary | ICD-10-CM

## 2017-07-29 DIAGNOSIS — Z8719 Personal history of other diseases of the digestive system: Secondary | ICD-10-CM | POA: Diagnosis not present

## 2017-07-29 DIAGNOSIS — M1990 Unspecified osteoarthritis, unspecified site: Secondary | ICD-10-CM | POA: Diagnosis not present

## 2017-07-29 DIAGNOSIS — K5900 Constipation, unspecified: Secondary | ICD-10-CM | POA: Diagnosis not present

## 2017-07-29 DIAGNOSIS — R070 Pain in throat: Secondary | ICD-10-CM | POA: Insufficient documentation

## 2017-07-29 DIAGNOSIS — Z5111 Encounter for antineoplastic chemotherapy: Secondary | ICD-10-CM

## 2017-07-29 DIAGNOSIS — Y842 Radiological procedure and radiotherapy as the cause of abnormal reaction of the patient, or of later complication, without mention of misadventure at the time of the procedure: Secondary | ICD-10-CM | POA: Diagnosis not present

## 2017-07-29 DIAGNOSIS — E039 Hypothyroidism, unspecified: Secondary | ICD-10-CM

## 2017-07-29 LAB — CBC WITH DIFFERENTIAL/PLATELET
Basophils Absolute: 0.1 10*3/uL (ref 0–0.1)
Basophils Relative: 1 %
Eosinophils Absolute: 0.1 10*3/uL (ref 0–0.7)
Eosinophils Relative: 2 %
HCT: 33.7 % — ABNORMAL LOW (ref 40.0–52.0)
Hemoglobin: 11.3 g/dL — ABNORMAL LOW (ref 13.0–18.0)
Lymphocytes Relative: 11 %
Lymphs Abs: 0.7 10*3/uL — ABNORMAL LOW (ref 1.0–3.6)
MCH: 29.4 pg (ref 26.0–34.0)
MCHC: 33.7 g/dL (ref 32.0–36.0)
MCV: 87.2 fL (ref 80.0–100.0)
Monocytes Absolute: 0.6 10*3/uL (ref 0.2–1.0)
Monocytes Relative: 9 %
Neutro Abs: 5.5 10*3/uL (ref 1.4–6.5)
Neutrophils Relative %: 77 %
Platelets: 243 10*3/uL (ref 150–440)
RBC: 3.86 MIL/uL — ABNORMAL LOW (ref 4.40–5.90)
RDW: 14.2 % (ref 11.5–14.5)
WBC: 7 10*3/uL (ref 3.8–10.6)

## 2017-07-29 LAB — COMPREHENSIVE METABOLIC PANEL
ALT: 22 U/L (ref 17–63)
AST: 27 U/L (ref 15–41)
Albumin: 3.8 g/dL (ref 3.5–5.0)
Alkaline Phosphatase: 65 U/L (ref 38–126)
Anion gap: 7 (ref 5–15)
BUN: 22 mg/dL — ABNORMAL HIGH (ref 6–20)
CO2: 27 mmol/L (ref 22–32)
Calcium: 9.3 mg/dL (ref 8.9–10.3)
Chloride: 103 mmol/L (ref 101–111)
Creatinine, Ser: 1.15 mg/dL (ref 0.61–1.24)
GFR calc Af Amer: >60 mL/min (ref >60)
GFR calc non Af Amer: 56 mL/min — ABNORMAL LOW (ref >60)
Glucose, Bld: 125 mg/dL — ABNORMAL HIGH (ref 65–99)
Potassium: 4 mmol/L (ref 3.5–5.1)
Sodium: 137 mmol/L (ref 135–145)
Total Bilirubin: 0.6 mg/dL (ref 0.3–1.2)
Total Protein: 7.3 g/dL (ref 6.5–8.1)

## 2017-07-29 LAB — MAGNESIUM: Magnesium: 1.7 mg/dL (ref 1.7–2.4)

## 2017-07-29 MED ORDER — FAMOTIDINE IN NACL 20-0.9 MG/50ML-% IV SOLN
20.0000 mg | Freq: Once | INTRAVENOUS | Status: AC
  Administered 2017-07-29: 20 mg via INTRAVENOUS
  Filled 2017-07-29: qty 50

## 2017-07-29 MED ORDER — DIPHENHYDRAMINE HCL 50 MG/ML IJ SOLN
50.0000 mg | Freq: Once | INTRAMUSCULAR | Status: AC
  Administered 2017-07-29: 50 mg via INTRAVENOUS
  Filled 2017-07-29: qty 1

## 2017-07-29 MED ORDER — PALONOSETRON HCL INJECTION 0.25 MG/5ML
0.2500 mg | Freq: Once | INTRAVENOUS | Status: AC
  Administered 2017-07-29: 0.25 mg via INTRAVENOUS
  Filled 2017-07-29: qty 5

## 2017-07-29 MED ORDER — PACLITAXEL CHEMO INJECTION 300 MG/50ML
50.0000 mg/m2 | Freq: Once | INTRAVENOUS | Status: AC
  Administered 2017-07-29: 90 mg via INTRAVENOUS
  Filled 2017-07-29: qty 15

## 2017-07-29 MED ORDER — SODIUM CHLORIDE 0.9 % IV SOLN
Freq: Once | INTRAVENOUS | Status: AC
  Administered 2017-07-29: 10:00:00 via INTRAVENOUS
  Filled 2017-07-29: qty 1000

## 2017-07-29 MED ORDER — SODIUM CHLORIDE 0.9 % IV SOLN
20.0000 mg | Freq: Once | INTRAVENOUS | Status: AC
  Administered 2017-07-29: 20 mg via INTRAVENOUS
  Filled 2017-07-29: qty 2

## 2017-07-29 MED ORDER — SODIUM CHLORIDE 0.9 % IV SOLN
140.0000 mg | Freq: Once | INTRAVENOUS | Status: AC
  Administered 2017-07-29: 140 mg via INTRAVENOUS
  Filled 2017-07-29: qty 14

## 2017-07-29 NOTE — Progress Notes (Signed)
Here for follow up. Stated " im doing good " family here w pt -supportive.

## 2017-07-29 NOTE — Progress Notes (Signed)
[facility] day:  07/29/2017  Chief Complaint: Aaron Becker is a 83 y.o. male with esophageal carcinoma who is seen for assessment prior to week #1 carboplatin and Taxol with radiation.  HPI:  The patient was last seen in the medical  oncology clinic on 07/19/2017.  At that time, he denied any difficulties with swallowing.  We discussed plans for chemotherapy with radiation.  Potential side effects were reviewed.  Symptomatically, patient is doing well. He notes that he is "active and keeping busy". Patient denies any physical complaints today. Patient has no difficulties swallowing. He has experienced no B symptoms or interval infections. Patient is eating well, with no demonstrated weight loss. Patient scheduled to start his radiation treatments after chemotherapy today.  Discuss blood sugar concerns. Patient's blood sugar was 114 this morning.   Past Medical History:  Diagnosis Date  . Anemia   . Arthritis    hands  . Bradycardia   . Colon polyp   . Diabetes mellitus, type II ([place]) 1986   oral meds  . Elevated PSA 2011   per Dr. Jacqlyn Larsen with Uro no problems last 5 or 6 yrs  . Esophageal cancer ([place]) dx sept 18 2018  . HOH (hard of hearing)    bilateral AIDES  . Hyperlipidemia   . Hypertension 02/02   off bp meds since hospitalization 3 weeks ago  . Hypothyroidism 1980's  . Palpitations   . Right rib fracture 2015  . SVT (supraventricular tachycardia) ([place])    a. reported SVT during admission at [facility] 06/2014  . Syncope and collapse    none recent last few weeks  . Wears dentures    upper and lower    Past Surgical History:  Procedure Laterality Date  . [place], 09/2015   COMPRESSED FRACTURE SPINE  . carotid ultrasound  02/02   [facility]  . CHOLECYSTECTOMY  1967  . COLONOSCOPY W/ POLYPECTOMY  2012   Villous adenoma from the rectum without high-grade dysplasia, 10 mm  . CYST EXCISION     thumb  . CYSTECTOMY  06/14/04    mucous cyst excision with left thumb, IP joint debridement  . HERNIA REPAIR  2013  . PROSTATE BIOPSY  11/01/08   Dr. Reece Agar  . [place]  . VASECTOMY  1975    Family History  Problem Relation Age of Onset  . Cancer Father        liver  . Cancer Sister        breast  . Cancer Brother        skin  . Cancer Sister        breast  . Heart disease Sister        pacer  . Heart disease Sister   . Cancer Sister        breast cancer  . Heart disease Sister   . Colon cancer Neg Hx   . Prostate cancer Neg Hx     Social History:  reports that  has never smoked. he has never used smokeless tobacco. He reports that he does not drink alcohol or use drugs.  Retired Agricultural consultant; worked for [employer] for 44 years.  He was in the [employer].  He denies any exposure to radiation or toxins.  The patient is accompanied by 3 children today. He is accompanied by his daughter and daughter today.  Allergies:  Allergies  Allergen Reactions  .  Flomax [Tamsulosin Hcl] Other (See Comments)    syncope  . Ibuprofen Other (See Comments)    Oral blisters at high doses  . Rosuvastatin     REACTION: muscle stiffness  . Sulfa Antibiotics Rash  . Sulfonamide Derivatives Rash    Current Medications: Current Outpatient Medications  Medication Sig Dispense Refill  . acetaminophen (TYLENOL) 500 MG tablet Take 500-1,000 mg by mouth every 6 (six) hours as needed (for pain.).    Marland Kitchen Ascorbic Acid (VITAMIN C) 1000 MG tablet Take 1,000 mg by mouth daily.    . diphenhydrAMINE (BENADRYL) 25 mg capsule Take 25 mg by mouth at bedtime as needed (for sleep.).     Marland Kitchen docusate sodium (COLACE) 100 MG capsule Take 100 mg by mouth 2 (two) times daily as needed (for constipation).    . ferrous sulfate 325 (65 FE) MG tablet Take 1 tablet (325 mg total) by mouth daily with breakfast.    . folic acid (FOLVITE) 644 MCG tablet Take 400 mcg by mouth daily with lunch.     . glimepiride (AMARYL) 1  MG tablet Take 2 tablets (2 mg total) by mouth daily with breakfast. 180 tablet 3  . glucose blood (ONE TOUCH ULTRA TEST) test strip TO TEST BLOOD SUGAR ONCE OR TWICE DAILY AND AS DIRECTED DX E11.9.  H/o variable blood sugars. (Patient taking differently: 1 each by Other route 2 (two) times daily. TO TEST BLOOD SUGAR ONCE OR TWICE DAILY AND AS DIRECTED DX E11.9.  H/o variable blood sugars.) 200 each 3  . levothyroxine (SYNTHROID, LEVOTHROID) 75 MCG tablet Take 1 tablet (75 mcg total) by mouth daily. (Patient taking differently: Take 75 mcg by mouth daily before breakfast. ) 90 tablet 3  . metFORMIN (GLUCOPHAGE) 500 MG tablet Take 1-2 tablets (500-1,000 mg total) by mouth 2 (two) times daily with a meal. 500 mg every morning and 1000 mg daily with supper (may take 1 extra tablet at night if needed)    . Multiple Vitamin (MULTIVITAMIN WITH MINERALS) TABS tablet Take 1 tablet by mouth daily. Centrum Silver    . ondansetron (ZOFRAN) 8 MG tablet Take 1 tablet (8 mg total) by mouth 2 (two) times daily as needed for refractory nausea / vomiting. Start on day 3 after chemo. 30 tablet 1  . pantoprazole (PROTONIX) 40 MG tablet Take 1 tablet (40 mg total) by mouth 2 (two) times daily. 60 tablet 1  . pravastatin (PRAVACHOL) 80 MG tablet Take 1 tablet (80 mg total) by mouth at bedtime.    . pyridOXINE (VITAMIN B-6) 100 MG tablet Take 100 mg by mouth 3 (three) times a week.      No current facility-administered medications for this visit.     Review of Systems:  GENERAL:  Feels "ok".  Active in yard.  Tries to "stay busy".  No fevers or sweats. Weight up 1 pound since last visit.  PERFORMANCE STATUS (ECOG):  2 HEENT:  Occupation related hearing loss.  Bilateral hearing aids. No visual changes, runny nose, sore throat, mouth sores or tenderness. Lungs: No shortness of breath.  No hemoptysis. Cardiac:  No chest pain, palpitations, orthopnea, or PND. GI:  No swallowing difficulties.  No nausea, vomiting, diarrhea,  constipation, melena or hematochezia. GU:  No urgency, frequency, dysuria, or hematuria. Musculoskeletal:  No back pain.  No joint pain.  No muscle tenderness. Extremities:  Arthritis in hands; joints stiff and "ache". No swelling. Skin:  No rashes or skin changes. Neuro:  No headache, numbness  or weakness, balance or coordination issues. Endocrine:  Diabetes.  Hypothyroid on supplementation.  No hot flashes or night sweats. Psych:  No mood changes, depression or anxiety. Pain:  No focal pain. Review of systems:  All other systems reviewed and found to be negative.  Physical Exam: Blood pressure (!) 181/82, pulse 82, temperature 97.6 F (36.4 C), temperature source Oral, resp. rate 18, weight 150 lb 9.6 oz (68.3 kg). GENERAL:  Thin elderly gentleman sitting comfortably in the exam room in no acute distress. MENTAL STATUS:  Alert and oriented to person, place and time. HEAD:  Pearline Cables thin hair.  Normocephalic, atraumatic, face symmetric, no Cushingoid features. EYES:  Glasses.  Blue eyes.  Pupils equal round and reactive to light and accomodation.  No conjunctivitis or scleral icterus. ENT:  Oropharynx clear without lesion.  Tongue normal.  Dentures.  Mucous membranes moist.  RESPIRATORY:  Clear to auscultation without rales, wheezes or rhonchi. CARDIOVASCULAR:  Regular rate and rhythm without murmur, rub or gallop. ABDOMEN:  Soft, non-tender, with active bowel sounds, and no hepatosplenomegaly.  No masses. SKIN:  No rashes, ulcers or lesions. EXTREMITIES: Arthritic nodules in hands. No edema, no skin discoloration or tenderness.  No palpable cords. LYMPH NODES: No palpable cervical, supraclavicular, axillary or inguinal adenopathy  NEUROLOGICAL: Unremarkable. PSYCH:  Appropriate.   Appointment on 07/29/2017  Component Date Value Ref Range Status  . Sodium 07/29/2017 137  135 - 145 mmol/L Final  . Potassium 07/29/2017 4.0  3.5 - 5.1 mmol/L Final  . Chloride 07/29/2017 103  101 - 111  mmol/L Final  . CO2 07/29/2017 27  22 - 32 mmol/L Final  . Glucose, Bld 07/29/2017 125* 65 - 99 mg/dL Final  . BUN 07/29/2017 22* 6 - 20 mg/dL Final  . Creatinine, Ser 07/29/2017 1.15  0.61 - 1.24 mg/dL Final  . Calcium 07/29/2017 9.3  8.9 - 10.3 mg/dL Final  . Total Protein 07/29/2017 7.3  6.5 - 8.1 g/dL Final  . Albumin 07/29/2017 3.8  3.5 - 5.0 g/dL Final  . AST 07/29/2017 27  15 - 41 U/L Final  . ALT 07/29/2017 22  17 - 63 U/L Final  . Alkaline Phosphatase 07/29/2017 65  38 - 126 U/L Final  . Total Bilirubin 07/29/2017 0.6  0.3 - 1.2 mg/dL Final  . GFR calc non Af Amer 07/29/2017 56* >60 mL/min Final  . GFR calc Af Amer 07/29/2017 >60  >60 mL/min Final   Comment: (NOTE) The eGFR has been calculated using the CKD EPI equation. This calculation has not been validated in all clinical situations. eGFR's persistently <60 mL/min signify possible Chronic Kidney Disease.   . Anion gap 07/29/2017 7  5 - 15 Final  . WBC 07/29/2017 7.0  3.8 - 10.6 K/uL Final  . RBC 07/29/2017 3.86* 4.40 - 5.90 MIL/uL Final  . Hemoglobin 07/29/2017 11.3* 13.0 - 18.0 g/dL Final  . HCT 07/29/2017 33.7* 40.0 - 52.0 % Final  . MCV 07/29/2017 87.2  80.0 - 100.0 fL Final  . MCH 07/29/2017 29.4  26.0 - 34.0 pg Final  . MCHC 07/29/2017 33.7  32.0 - 36.0 g/dL Final  . RDW 07/29/2017 14.2  11.5 - 14.5 % Final  . Platelets 07/29/2017 243  150 - 440 K/uL Final  . Neutrophils Relative % 07/29/2017 77  % Final  . Neutro Abs 07/29/2017 5.5  1.4 - 6.5 K/uL Final  . Lymphocytes Relative 07/29/2017 11  % Final  . Lymphs Abs 07/29/2017 0.7* 1.0 - 3.6 K/uL  Final  . Monocytes Relative 07/29/2017 9  % Final  . Monocytes Absolute 07/29/2017 0.6  0.2 - 1.0 K/uL Final  . Eosinophils Relative 07/29/2017 2  % Final  . Eosinophils Absolute 07/29/2017 0.1  0 - 0.7 K/uL Final  . Basophils Relative 07/29/2017 1  % Final  . Basophils Absolute 07/29/2017 0.1  0 - 0.1 K/uL Final  . Magnesium 07/29/2017 1.7  1.7 - 2.4 mg/dL Final     Assessment:  Aaron Becker is a 83 y.o. male with clinical stage II esophageal cancer (uT2N0).  He presented with an upper GI bleed.  EGD on 06/11/2017 revealed one cratered esophageal ulcer and stigmata of recent esophogeal bleeding at the GE junction.  Biopsy revealed poorly differentiated adenocarcinoma, signet ring type.  There was one non-bleeding cratered gastric ulcer with no stigmata of bleeding in the gastric antrum.  Duodenum was normal.  Colonoscopy on 03/30/2015 revealed a 20 mm tubulovillous adenoma of the rectum.  Flexible sigmoidoscopy on 06/01/2017 revealed a sessile polyp in the rectum and grade II non-bleeding internal hemorrhoids.  PET scan  on 06/27/2017 that revealed no appreciable abnormal esophageal hypermetabolic activity to correspond with the reported tumor. There was no adenopathy or other findings of metastatic disease. There was question that the original tumor is either low-grade or very small.  Upper endoscopic ultrasound (EUS) on 07/04/2017 revealed a 1-2cm ulcerated mass at the GE junction that was partially circumferential and nonobstructive. The mass is 1 cm in thickness and occupies 3/4 of the  lumen circumference at the GE junction. The mass clearly passes into, but not through the muscularis propria layer of the esophageal wall. There was no paraesophageal adenopathy. Clinical stage was uT2N0 GE junction adenocarcinoma.  Symptomatically, he is doing well.  He denies any dysphagia.  He denies any melena or hematochezia.  Exam is unremarkable. Labs are stable.   Plan: 1.  Labs today:  CBC with diff, CMP, Mg. 2.  Review chemotherapy plan.  Potential chemotherapy side effects reviewed including myelosuppression, nausea, vomiting, allergic reaction, and electrolyte wasting.  Discussed side effects of radiation.  Patient consented to treatment. 3.  Week #1 carboplatin and Taxol.   4.  Discuss blood sugar control. Patient was stable this morning, with a  preprandial CBG of 114. Patient was reminded that he will be receiving steroids with this chemotherapy treatments. Education provided regarding the risk of hyperglycemia associated with corticosteroid therapy. We communicated with Dr. Damita Dunnings during patient's last visit regarding the need for SSI coverage to be used while on treatment. Dr. Damita Dunnings electing not to make any further treatment at this time. Patient has a PRN dose of Metformin that he can use for hyperglycemia episodes.  5.  RTC in 1 week for MD assessment, labs (CBC with diff, CMP, Mg), and week # 2 of carboplatin + Taxol.     Lequita Asal, MD  07/29/2017, 9:58 AM   I saw and evaluated the patient, participating in the key portions of the service and reviewing pertinent diagnostic studies and records.  I reviewed the nurse practitioner's note and agree with the findings and the plan.  The assessment and plan were discussed with the patient.  Multiple questions were asked by the patient and his family and answered.   Nolon Stalls, MD 07/29/2017, 9:58 AM

## 2017-07-30 ENCOUNTER — Ambulatory Visit
Admission: RE | Admit: 2017-07-30 | Discharge: 2017-07-30 | Disposition: A | Discharge status: Home / Self Care | Payer: Medicare Other | Source: Ambulatory Visit | Attending: Radiation Oncology | Admitting: Radiation Oncology

## 2017-07-30 ENCOUNTER — Telehealth: Payer: Self-pay | Admitting: Family Medicine

## 2017-07-30 DIAGNOSIS — C155 Malignant neoplasm of lower third of esophagus: Secondary | ICD-10-CM | POA: Diagnosis not present

## 2017-07-30 NOTE — Telephone Encounter (Signed)
Call pt. If he has sig sugar elevations, especially AM sugar elevations, with the steroid treatment then let me know and we can adjust his meds.  Thanks.

## 2017-07-30 NOTE — Telephone Encounter (Signed)
Patient's partner Aaron Becker on [place])) notified as instructed by telephone and verbalize understanding. Aaron Becker stated that his blood sugar has been fine so far, but if anything changes they will let Dr. Damita Dunnings know.

## 2017-07-31 ENCOUNTER — Ambulatory Visit
Admission: RE | Admit: 2017-07-31 | Discharge: 2017-07-31 | Disposition: A | Discharge status: Home / Self Care | Payer: Medicare Other | Source: Ambulatory Visit | Attending: Radiation Oncology | Admitting: Radiation Oncology

## 2017-07-31 DIAGNOSIS — C155 Malignant neoplasm of lower third of esophagus: Secondary | ICD-10-CM | POA: Diagnosis not present

## 2017-08-01 ENCOUNTER — Ambulatory Visit
Admission: RE | Admit: 2017-08-01 | Discharge: 2017-08-01 | Disposition: A | Discharge status: Home / Self Care | Payer: Medicare Other | Source: Ambulatory Visit | Attending: Radiation Oncology | Admitting: Radiation Oncology

## 2017-08-01 DIAGNOSIS — C155 Malignant neoplasm of lower third of esophagus: Secondary | ICD-10-CM | POA: Diagnosis not present

## 2017-08-02 ENCOUNTER — Ambulatory Visit
Admission: RE | Admit: 2017-08-02 | Discharge: 2017-08-02 | Disposition: A | Discharge status: Home / Self Care | Payer: Medicare Other | Source: Ambulatory Visit | Attending: Radiation Oncology | Admitting: Radiation Oncology

## 2017-08-02 DIAGNOSIS — C155 Malignant neoplasm of lower third of esophagus: Secondary | ICD-10-CM | POA: Diagnosis not present

## 2017-08-05 ENCOUNTER — Encounter: Payer: Self-pay | Admitting: Hematology and Oncology

## 2017-08-05 ENCOUNTER — Inpatient Hospital Stay: Payer: Medicare Other

## 2017-08-05 ENCOUNTER — Ambulatory Visit
Admission: RE | Admit: 2017-08-05 | Discharge: 2017-08-05 | Disposition: A | Discharge status: Home / Self Care | Payer: Medicare Other | Source: Ambulatory Visit | Attending: Radiation Oncology | Admitting: Radiation Oncology

## 2017-08-05 ENCOUNTER — Other Ambulatory Visit: Payer: Self-pay | Admitting: *Deleted

## 2017-08-05 ENCOUNTER — Inpatient Hospital Stay (HOSPITAL_BASED_OUTPATIENT_CLINIC_OR_DEPARTMENT_OTHER): Payer: Medicare Other | Admitting: Hematology and Oncology

## 2017-08-05 VITALS — BP 179/91 | HR 76 | Temp 98.8°F | Resp 14 | Wt 150.0 lb

## 2017-08-05 DIAGNOSIS — Z9221 Personal history of antineoplastic chemotherapy: Secondary | ICD-10-CM

## 2017-08-05 DIAGNOSIS — Z7984 Long term (current) use of oral hypoglycemic drugs: Secondary | ICD-10-CM

## 2017-08-05 DIAGNOSIS — C159 Malignant neoplasm of esophagus, unspecified: Secondary | ICD-10-CM

## 2017-08-05 DIAGNOSIS — K648 Other hemorrhoids: Secondary | ICD-10-CM

## 2017-08-05 DIAGNOSIS — J392 Other diseases of pharynx: Secondary | ICD-10-CM

## 2017-08-05 DIAGNOSIS — M1990 Unspecified osteoarthritis, unspecified site: Secondary | ICD-10-CM | POA: Diagnosis not present

## 2017-08-05 DIAGNOSIS — C155 Malignant neoplasm of lower third of esophagus: Secondary | ICD-10-CM | POA: Diagnosis not present

## 2017-08-05 DIAGNOSIS — E039 Hypothyroidism, unspecified: Secondary | ICD-10-CM

## 2017-08-05 DIAGNOSIS — K5900 Constipation, unspecified: Secondary | ICD-10-CM | POA: Diagnosis not present

## 2017-08-05 DIAGNOSIS — Z86010 Personal history of colonic polyps: Secondary | ICD-10-CM

## 2017-08-05 DIAGNOSIS — Z882 Allergy status to sulfonamides status: Secondary | ICD-10-CM

## 2017-08-05 DIAGNOSIS — Z8719 Personal history of other diseases of the digestive system: Secondary | ICD-10-CM

## 2017-08-05 DIAGNOSIS — K621 Rectal polyp: Secondary | ICD-10-CM | POA: Diagnosis not present

## 2017-08-05 DIAGNOSIS — C160 Malignant neoplasm of cardia: Secondary | ICD-10-CM

## 2017-08-05 DIAGNOSIS — Z5111 Encounter for antineoplastic chemotherapy: Secondary | ICD-10-CM

## 2017-08-05 DIAGNOSIS — I10 Essential (primary) hypertension: Secondary | ICD-10-CM | POA: Diagnosis not present

## 2017-08-05 DIAGNOSIS — Z7189 Other specified counseling: Secondary | ICD-10-CM

## 2017-08-05 DIAGNOSIS — E119 Type 2 diabetes mellitus without complications: Secondary | ICD-10-CM

## 2017-08-05 DIAGNOSIS — Z808 Family history of malignant neoplasm of other organs or systems: Secondary | ICD-10-CM

## 2017-08-05 DIAGNOSIS — Z803 Family history of malignant neoplasm of breast: Secondary | ICD-10-CM

## 2017-08-05 DIAGNOSIS — E785 Hyperlipidemia, unspecified: Secondary | ICD-10-CM

## 2017-08-05 DIAGNOSIS — Z79899 Other long term (current) drug therapy: Secondary | ICD-10-CM

## 2017-08-05 LAB — COMPREHENSIVE METABOLIC PANEL
ALT: 22 U/L (ref 17–63)
AST: 26 U/L (ref 15–41)
Albumin: 3.8 g/dL (ref 3.5–5.0)
Alkaline Phosphatase: 61 U/L (ref 38–126)
Anion gap: 6 (ref 5–15)
BUN: 21 mg/dL — ABNORMAL HIGH (ref 6–20)
CO2: 27 mmol/L (ref 22–32)
Calcium: 9.1 mg/dL (ref 8.9–10.3)
Chloride: 101 mmol/L (ref 101–111)
Creatinine, Ser: 1.15 mg/dL (ref 0.61–1.24)
GFR calc Af Amer: >60 mL/min (ref >60)
GFR calc non Af Amer: 56 mL/min — ABNORMAL LOW (ref >60)
Glucose, Bld: 174 mg/dL — ABNORMAL HIGH (ref 65–99)
Potassium: 4.3 mmol/L (ref 3.5–5.1)
Sodium: 134 mmol/L — ABNORMAL LOW (ref 135–145)
Total Bilirubin: 0.7 mg/dL (ref 0.3–1.2)
Total Protein: 7.1 g/dL (ref 6.5–8.1)

## 2017-08-05 LAB — CBC WITH DIFFERENTIAL/PLATELET
Basophils Absolute: 0.1 10*3/uL (ref 0–0.1)
Basophils Relative: 1 %
Eosinophils Absolute: 0.1 10*3/uL (ref 0–0.7)
Eosinophils Relative: 1 %
HCT: 32.9 % — ABNORMAL LOW (ref 40.0–52.0)
Hemoglobin: 10.7 g/dL — ABNORMAL LOW (ref 13.0–18.0)
Lymphocytes Relative: 6 %
Lymphs Abs: 0.5 10*3/uL — ABNORMAL LOW (ref 1.0–3.6)
MCH: 28.8 pg (ref 26.0–34.0)
MCHC: 32.7 g/dL (ref 32.0–36.0)
MCV: 88.2 fL (ref 80.0–100.0)
Monocytes Absolute: 0.4 10*3/uL (ref 0.2–1.0)
Monocytes Relative: 5 %
Neutro Abs: 7 10*3/uL — ABNORMAL HIGH (ref 1.4–6.5)
Neutrophils Relative %: 87 %
Platelets: 216 10*3/uL (ref 150–440)
RBC: 3.73 MIL/uL — ABNORMAL LOW (ref 4.40–5.90)
RDW: 14 % (ref 11.5–14.5)
WBC: 7.9 10*3/uL (ref 3.8–10.6)

## 2017-08-05 LAB — MAGNESIUM: Magnesium: 1.7 mg/dL (ref 1.7–2.4)

## 2017-08-05 MED ORDER — SODIUM CHLORIDE 0.9 % IV SOLN
Freq: Once | INTRAVENOUS | Status: AC
  Administered 2017-08-05: 12:00:00 via INTRAVENOUS
  Filled 2017-08-05: qty 1000

## 2017-08-05 MED ORDER — SODIUM CHLORIDE 0.9 % IV SOLN
20.0000 mg | Freq: Once | INTRAVENOUS | Status: AC
  Administered 2017-08-05: 20 mg via INTRAVENOUS
  Filled 2017-08-05: qty 2

## 2017-08-05 MED ORDER — PALONOSETRON HCL INJECTION 0.25 MG/5ML
0.2500 mg | Freq: Once | INTRAVENOUS | Status: AC
  Administered 2017-08-05: 0.25 mg via INTRAVENOUS
  Filled 2017-08-05: qty 5

## 2017-08-05 MED ORDER — FAMOTIDINE IN NACL 20-0.9 MG/50ML-% IV SOLN
20.0000 mg | Freq: Once | INTRAVENOUS | Status: AC
  Administered 2017-08-05: 20 mg via INTRAVENOUS
  Filled 2017-08-05: qty 50

## 2017-08-05 MED ORDER — SODIUM CHLORIDE 0.9 % IV SOLN
139.2000 mg | Freq: Once | INTRAVENOUS | Status: AC
  Administered 2017-08-05: 140 mg via INTRAVENOUS
  Filled 2017-08-05: qty 14

## 2017-08-05 MED ORDER — PACLITAXEL CHEMO INJECTION 300 MG/50ML
50.0000 mg/m2 | Freq: Once | INTRAVENOUS | Status: AC
  Administered 2017-08-05: 90 mg via INTRAVENOUS
  Filled 2017-08-05: qty 15

## 2017-08-05 MED ORDER — DIPHENHYDRAMINE HCL 50 MG/ML IJ SOLN
50.0000 mg | Freq: Once | INTRAMUSCULAR | Status: AC
  Administered 2017-08-05: 50 mg via INTRAVENOUS
  Filled 2017-08-05: qty 1

## 2017-08-05 NOTE — Progress Notes (Addendum)
[facility] day:  08/05/2017  Chief Complaint: Aaron Becker is a 83 y.o. male with esophageal carcinoma who is seen for assessment prior to week #2 carboplatin and Taxol with radiation.  HPI:  The patient was last seen in the medical  oncology clinic on 07/29/2017.  At that time, he was doing well.  He denied any dysphagia.  He began radiation and concurrent chemotherapy.  During the interim, patient doing well.  He denies side effects associated with first chemotherapy treatment. He has had some interval constipation. Patient's blood sugar has been well controlled on current antidiabetic regimen. He denies blood sugar spikes. Patient has not experienced any B symptoms or interval infections. He was evaluated last week by radiation oncologist. He was told that he was doing well. Patient notes an intermittent "dry throat". He denies pain. He is eating well, with no demonstrated weight loss.    Past Medical History:  Diagnosis Date  . Anemia   . Arthritis    hands  . Bradycardia   . Colon polyp   . Diabetes mellitus, type II ([place]) 1986   oral meds  . Elevated PSA 2011   per Dr. Jacqlyn Larsen with Uro no problems last 5 or 6 yrs  . Esophageal cancer ([place]) dx sept 18 2018  . HOH (hard of hearing)    bilateral AIDES  . Hyperlipidemia   . Hypertension 02/02   off bp meds since hospitalization 3 weeks ago  . Hypothyroidism 1980's  . Palpitations   . Right rib fracture 2015  . SVT (supraventricular tachycardia) ([place])    a. reported SVT during admission at [facility] 06/2014  . Syncope and collapse    none recent last few weeks  . Wears dentures    upper and lower    Past Surgical History:  Procedure Laterality Date  . [place], 09/2015   COMPRESSED FRACTURE SPINE  . carotid ultrasound  02/02   [facility]  . CHOLECYSTECTOMY  1967  . COLONOSCOPY W/ POLYPECTOMY  2012   Villous adenoma from the rectum without high-grade dysplasia, 10 mm  . CYST  EXCISION     thumb  . CYSTECTOMY  06/14/04   mucous cyst excision with left thumb, IP joint debridement  . HERNIA REPAIR  2013  . PROSTATE BIOPSY  11/01/08   Dr. Reece Agar  . [place]  . VASECTOMY  1975    Family History  Problem Relation Age of Onset  . Cancer Father        liver  . Cancer Sister        breast  . Cancer Brother        skin  . Cancer Sister        breast  . Heart disease Sister        pacer  . Heart disease Sister   . Cancer Sister        breast cancer  . Heart disease Sister   . Colon cancer Neg Hx   . Prostate cancer Neg Hx     Social History:  reports that  has never smoked. he has never used smokeless tobacco. He reports that he does not drink alcohol or use drugs.  Retired Agricultural consultant; worked for [employer] for 44 years.  He was in the [employer].  He denies any exposure to radiation or toxins.  The patient is accompanied by 3 children today. He is accompanied  by his daughter today.  Allergies:  Allergies  Allergen Reactions  . Flomax [Tamsulosin Hcl] Other (See Comments)    syncope  . Ibuprofen Other (See Comments)    Oral blisters at high doses  . Rosuvastatin     REACTION: muscle stiffness  . Sulfa Antibiotics Rash  . Sulfonamide Derivatives Rash    Current Medications: Current Outpatient Medications  Medication Sig Dispense Refill  . acetaminophen (TYLENOL) 500 MG tablet Take 500-1,000 mg by mouth every 6 (six) hours as needed (for pain.).    Marland Kitchen Ascorbic Acid (VITAMIN C) 1000 MG tablet Take 1,000 mg by mouth daily.    . diphenhydrAMINE (BENADRYL) 25 mg capsule Take 25 mg by mouth at bedtime as needed (for sleep.).     Marland Kitchen docusate sodium (COLACE) 100 MG capsule Take 100 mg by mouth 2 (two) times daily as needed (for constipation).    . ferrous sulfate 325 (65 FE) MG tablet Take 1 tablet (325 mg total) by mouth daily with breakfast.    . folic acid (FOLVITE) 539 MCG tablet Take 400 mcg by mouth daily with lunch.      . glimepiride (AMARYL) 1 MG tablet Take 2 tablets (2 mg total) by mouth daily with breakfast. 180 tablet 3  . glucose blood (ONE TOUCH ULTRA TEST) test strip TO TEST BLOOD SUGAR ONCE OR TWICE DAILY AND AS DIRECTED DX E11.9.  H/o variable blood sugars. (Patient taking differently: 1 each by Other route 2 (two) times daily. TO TEST BLOOD SUGAR ONCE OR TWICE DAILY AND AS DIRECTED DX E11.9.  H/o variable blood sugars.) 200 each 3  . levothyroxine (SYNTHROID, LEVOTHROID) 75 MCG tablet Take 1 tablet (75 mcg total) by mouth daily. (Patient taking differently: Take 75 mcg by mouth daily before breakfast. ) 90 tablet 3  . metFORMIN (GLUCOPHAGE) 500 MG tablet Take 1-2 tablets (500-1,000 mg total) by mouth 2 (two) times daily with a meal. 500 mg every morning and 1000 mg daily with supper (may take 1 extra tablet at night if needed)    . Multiple Vitamin (MULTIVITAMIN WITH MINERALS) TABS tablet Take 1 tablet by mouth daily. Centrum Silver    . ondansetron (ZOFRAN) 8 MG tablet Take 1 tablet (8 mg total) by mouth 2 (two) times daily as needed for refractory nausea / vomiting. Start on day 3 after chemo. 30 tablet 1  . pantoprazole (PROTONIX) 40 MG tablet Take 1 tablet (40 mg total) by mouth 2 (two) times daily. 60 tablet 1  . pravastatin (PRAVACHOL) 80 MG tablet Take 1 tablet (80 mg total) by mouth at bedtime.    . pyridOXINE (VITAMIN B-6) 100 MG tablet Take 100 mg by mouth 3 (three) times a week.      No current facility-administered medications for this visit.     Review of Systems:  GENERAL:  Feels "good". No fevers or sweats. Weight stable since last visit.  PERFORMANCE STATUS (ECOG):  2 HEENT:  Occupation related hearing loss.  Bilateral hearing aids. Intermittent dry throat.  No visual changes, runny nose, sore throat, mouth sores or tenderness. Lungs: No shortness of breath.  No hemoptysis. Cardiac:  No chest pain, palpitations, orthopnea, or PND. GI:  No swallowing difficulties.  Little  constipation.  No nausea, vomiting, diarrhea, melena or hematochezia. GU:  No urgency, frequency, dysuria, or hematuria. Musculoskeletal:  No back pain.  No joint pain.  No muscle tenderness. Extremities:  Arthritis in hands; joints stiff and "ache". No swelling. Skin:  No rashes or  skin changes. Neuro:  No headache, numbness or weakness, balance or coordination issues. Endocrine:  Diabetes, well controlled.  Hypothyroid on supplementation.  No hot flashes or night sweats. Psych:  No mood changes, depression or anxiety. Pain:  No focal pain. Review of systems:  All other systems reviewed and found to be negative.  Physical Exam: Blood pressure (!) 179/91, pulse 76, temperature 98.8 F (37.1 C), temperature source Tympanic, resp. rate 14, weight 150 lb (68 kg). GENERAL:  Thin elderly gentleman sitting comfortably in the exam room in a wheelchair (for convenience) in no acute distress. MENTAL STATUS:  Alert and oriented to person, place and time. HEAD:  Pearline Cables thin hair.  Normocephalic, atraumatic, face symmetric, no Cushingoid features. EYES:  Glasses.  Blue eyes.  Pupils equal round and reactive to light and accomodation.  No conjunctivitis or scleral icterus. ENT:  Oropharynx clear without lesion.  Tongue normal.  Dentures.  Mucous membranes moist.  RESPIRATORY:  Clear to auscultation without rales, wheezes or rhonchi. CARDIOVASCULAR:  Regular rate and rhythm without murmur, rub or gallop. ABDOMEN:  Soft, non-tender, with active bowel sounds, and no hepatosplenomegaly.  No masses. SKIN:  No rashes, ulcers or lesions. EXTREMITIES: Arthritic nodules in hands. No edema, no skin discoloration or tenderness.  No palpable cords. LYMPH NODES: No palpable cervical, supraclavicular, axillary or inguinal adenopathy  NEUROLOGICAL: Unremarkable. PSYCH:  Appropriate.   Appointment on 08/05/2017  Component Date Value Ref Range Status  . WBC 08/05/2017 7.9  3.8 - 10.6 K/uL Final  . RBC 08/05/2017  3.73* 4.40 - 5.90 MIL/uL Final  . Hemoglobin 08/05/2017 10.7* 13.0 - 18.0 g/dL Final  . HCT 08/05/2017 32.9* 40.0 - 52.0 % Final  . MCV 08/05/2017 88.2  80.0 - 100.0 fL Final  . MCH 08/05/2017 28.8  26.0 - 34.0 pg Final  . MCHC 08/05/2017 32.7  32.0 - 36.0 g/dL Final  . RDW 08/05/2017 14.0  11.5 - 14.5 % Final  . Platelets 08/05/2017 216  150 - 440 K/uL Final  . Neutrophils Relative % 08/05/2017 87  % Final  . Neutro Abs 08/05/2017 7.0* 1.4 - 6.5 K/uL Final  . Lymphocytes Relative 08/05/2017 6  % Final  . Lymphs Abs 08/05/2017 0.5* 1.0 - 3.6 K/uL Final  . Monocytes Relative 08/05/2017 5  % Final  . Monocytes Absolute 08/05/2017 0.4  0.2 - 1.0 K/uL Final  . Eosinophils Relative 08/05/2017 1  % Final  . Eosinophils Absolute 08/05/2017 0.1  0 - 0.7 K/uL Final  . Basophils Relative 08/05/2017 1  % Final  . Basophils Absolute 08/05/2017 0.1  0 - 0.1 K/uL Final  . Magnesium 08/05/2017 1.7  1.7 - 2.4 mg/dL Final  . Sodium 08/05/2017 134* 135 - 145 mmol/L Final  . Potassium 08/05/2017 4.3  3.5 - 5.1 mmol/L Final  . Chloride 08/05/2017 101  101 - 111 mmol/L Final  . CO2 08/05/2017 27  22 - 32 mmol/L Final  . Glucose, Bld 08/05/2017 174* 65 - 99 mg/dL Final  . BUN 08/05/2017 21* 6 - 20 mg/dL Final  . Creatinine, Ser 08/05/2017 1.15  0.61 - 1.24 mg/dL Final  . Calcium 08/05/2017 9.1  8.9 - 10.3 mg/dL Final  . Total Protein 08/05/2017 7.1  6.5 - 8.1 g/dL Final  . Albumin 08/05/2017 3.8  3.5 - 5.0 g/dL Final  . AST 08/05/2017 26  15 - 41 U/L Final  . ALT 08/05/2017 22  17 - 63 U/L Final  . Alkaline Phosphatase 08/05/2017 61  38 -  126 U/L Final  . Total Bilirubin 08/05/2017 0.7  0.3 - 1.2 mg/dL Final  . GFR calc non Af Amer 08/05/2017 56* >60 mL/min Final  . GFR calc Af Amer 08/05/2017 >60  >60 mL/min Final   Comment: (NOTE) The eGFR has been calculated using the CKD EPI equation. This calculation has not been validated in all clinical situations. eGFR's persistently <60 mL/min signify  possible Chronic Kidney Disease.   . Anion gap 08/05/2017 6  5 - 15 Final    Assessment:  Aaron Becker is a 83 y.o. male with clinical stage II esophageal cancer (uT2N0).  He presented with an upper GI bleed.  EGD on 06/11/2017 revealed one cratered esophageal ulcer and stigmata of recent esophogeal bleeding at the GE junction.  Biopsy revealed poorly differentiated adenocarcinoma, signet ring type.  There was one non-bleeding cratered gastric ulcer with no stigmata of bleeding in the gastric antrum.  Duodenum was normal.  Colonoscopy on 03/30/2015 revealed a 20 mm tubulovillous adenoma of the rectum.  Flexible sigmoidoscopy on 06/01/2017 revealed a sessile polyp in the rectum and grade II non-bleeding internal hemorrhoids.  PET scan  on 06/27/2017 that revealed no appreciable abnormal esophageal hypermetabolic activity to correspond with the reported tumor. There was no adenopathy or other findings of metastatic disease. There was question that the original tumor is either low-grade or very small.  Upper endoscopic ultrasound (EUS) on 07/04/2017 revealed a 1-2cm ulcerated mass at the GE junction that was partially circumferential and nonobstructive. The mass is 1 cm in thickness and occupies 3/4 of the  lumen circumference at the GE junction. The mass clearly passes into, but not through the muscularis propria layer of the esophageal wall. There was no paraesophageal adenopathy. Clinical stage was uT2N0 GE junction adenocarcinoma.  He is s/p week #1 carboplatin and Taxol with radiation (began on 07/29/2017).  Symptomatically, he is doing well following his first cycle of chemotherapy.  He denies any dysphagia.  He denies any melena or hematochezia.  Exam is unremarkable.  Labs are stable.   Plan: 1.  Labs today:  CBC with diff, CMP, Mg.  2.  Week #2 carboplatin and Taxol. 3.  Continue to follow up with radiation oncology as already scheduled.  4.  RTC in 1 week for MD assessment, labs (CBC  with diff, CMP, Mg), and week # 3 of carboplatin + Taxol.     Honor Loh, NP  08/05/2017, 11:12 AM   I saw and evaluated the patient, participating in the key portions of the service and reviewing pertinent diagnostic studies and records.  I reviewed the nurse practitioner's note and agree with the findings and the plan.  The assessment and plan were discussed with the patient.  Several questions were asked by the patient and his family and answered.   Nolon Stalls, MD 08/05/2017, 11:12 AM

## 2017-08-05 NOTE — Progress Notes (Signed)
Patient here for follow up with lab and treatment today. He states that he is feeling well and denies having any pain.

## 2017-08-06 ENCOUNTER — Other Ambulatory Visit: Payer: Medicare Other

## 2017-08-06 ENCOUNTER — Ambulatory Visit: Payer: Medicare Other | Admitting: Hematology and Oncology

## 2017-08-06 ENCOUNTER — Ambulatory Visit: Payer: Medicare Other

## 2017-08-06 ENCOUNTER — Ambulatory Visit
Admission: RE | Admit: 2017-08-06 | Discharge: 2017-08-06 | Disposition: A | Discharge status: Home / Self Care | Payer: Medicare Other | Source: Ambulatory Visit | Attending: Radiation Oncology | Admitting: Radiation Oncology

## 2017-08-06 DIAGNOSIS — C155 Malignant neoplasm of lower third of esophagus: Secondary | ICD-10-CM | POA: Diagnosis not present

## 2017-08-07 ENCOUNTER — Ambulatory Visit
Admission: RE | Admit: 2017-08-07 | Discharge: 2017-08-07 | Disposition: A | Discharge status: Home / Self Care | Payer: Medicare Other | Source: Ambulatory Visit | Attending: Radiation Oncology | Admitting: Radiation Oncology

## 2017-08-07 DIAGNOSIS — C155 Malignant neoplasm of lower third of esophagus: Secondary | ICD-10-CM | POA: Diagnosis not present

## 2017-08-08 ENCOUNTER — Ambulatory Visit
Admission: RE | Admit: 2017-08-08 | Discharge: 2017-08-08 | Disposition: A | Discharge status: Home / Self Care | Payer: Medicare Other | Source: Ambulatory Visit | Attending: Radiation Oncology | Admitting: Radiation Oncology

## 2017-08-08 DIAGNOSIS — C155 Malignant neoplasm of lower third of esophagus: Secondary | ICD-10-CM | POA: Diagnosis not present

## 2017-08-09 ENCOUNTER — Ambulatory Visit
Admission: RE | Admit: 2017-08-09 | Discharge: 2017-08-09 | Disposition: A | Discharge status: Home / Self Care | Payer: Medicare Other | Source: Ambulatory Visit | Attending: Radiation Oncology | Admitting: Radiation Oncology

## 2017-08-09 DIAGNOSIS — C155 Malignant neoplasm of lower third of esophagus: Secondary | ICD-10-CM | POA: Diagnosis not present

## 2017-08-12 ENCOUNTER — Inpatient Hospital Stay (HOSPITAL_BASED_OUTPATIENT_CLINIC_OR_DEPARTMENT_OTHER): Payer: Medicare Other | Admitting: Hematology and Oncology

## 2017-08-12 ENCOUNTER — Other Ambulatory Visit: Payer: Self-pay | Admitting: *Deleted

## 2017-08-12 ENCOUNTER — Encounter: Payer: Self-pay | Admitting: Hematology and Oncology

## 2017-08-12 ENCOUNTER — Inpatient Hospital Stay: Payer: Medicare Other

## 2017-08-12 ENCOUNTER — Ambulatory Visit
Admission: RE | Admit: 2017-08-12 | Discharge: 2017-08-12 | Disposition: A | Discharge status: Home / Self Care | Payer: Medicare Other | Source: Ambulatory Visit | Attending: Radiation Oncology | Admitting: Radiation Oncology

## 2017-08-12 VITALS — BP 157/83 | HR 81 | Temp 98.8°F | Resp 18 | Wt 153.5 lb

## 2017-08-12 VITALS — BP 162/73 | HR 76 | Temp 98.9°F | Resp 18

## 2017-08-12 DIAGNOSIS — H918X9 Other specified hearing loss, unspecified ear: Secondary | ICD-10-CM | POA: Diagnosis not present

## 2017-08-12 DIAGNOSIS — K621 Rectal polyp: Secondary | ICD-10-CM

## 2017-08-12 DIAGNOSIS — C159 Malignant neoplasm of esophagus, unspecified: Secondary | ICD-10-CM

## 2017-08-12 DIAGNOSIS — Z882 Allergy status to sulfonamides status: Secondary | ICD-10-CM

## 2017-08-12 DIAGNOSIS — E039 Hypothyroidism, unspecified: Secondary | ICD-10-CM

## 2017-08-12 DIAGNOSIS — Z8719 Personal history of other diseases of the digestive system: Secondary | ICD-10-CM

## 2017-08-12 DIAGNOSIS — Z7189 Other specified counseling: Secondary | ICD-10-CM

## 2017-08-12 DIAGNOSIS — C160 Malignant neoplasm of cardia: Secondary | ICD-10-CM

## 2017-08-12 DIAGNOSIS — E119 Type 2 diabetes mellitus without complications: Secondary | ICD-10-CM

## 2017-08-12 DIAGNOSIS — Z7984 Long term (current) use of oral hypoglycemic drugs: Secondary | ICD-10-CM | POA: Diagnosis not present

## 2017-08-12 DIAGNOSIS — K5900 Constipation, unspecified: Secondary | ICD-10-CM | POA: Diagnosis not present

## 2017-08-12 DIAGNOSIS — E785 Hyperlipidemia, unspecified: Secondary | ICD-10-CM | POA: Diagnosis not present

## 2017-08-12 DIAGNOSIS — I10 Essential (primary) hypertension: Secondary | ICD-10-CM

## 2017-08-12 DIAGNOSIS — Z79899 Other long term (current) drug therapy: Secondary | ICD-10-CM

## 2017-08-12 DIAGNOSIS — M1990 Unspecified osteoarthritis, unspecified site: Secondary | ICD-10-CM | POA: Diagnosis not present

## 2017-08-12 DIAGNOSIS — Z9221 Personal history of antineoplastic chemotherapy: Secondary | ICD-10-CM

## 2017-08-12 DIAGNOSIS — Z808 Family history of malignant neoplasm of other organs or systems: Secondary | ICD-10-CM

## 2017-08-12 DIAGNOSIS — K648 Other hemorrhoids: Secondary | ICD-10-CM | POA: Diagnosis not present

## 2017-08-12 DIAGNOSIS — Z5111 Encounter for antineoplastic chemotherapy: Secondary | ICD-10-CM

## 2017-08-12 DIAGNOSIS — Z86010 Personal history of colonic polyps: Secondary | ICD-10-CM

## 2017-08-12 DIAGNOSIS — C155 Malignant neoplasm of lower third of esophagus: Secondary | ICD-10-CM | POA: Diagnosis not present

## 2017-08-12 DIAGNOSIS — Z803 Family history of malignant neoplasm of breast: Secondary | ICD-10-CM

## 2017-08-12 LAB — COMPREHENSIVE METABOLIC PANEL
ALT: 22 U/L (ref 17–63)
AST: 24 U/L (ref 15–41)
Albumin: 3.5 g/dL (ref 3.5–5.0)
Alkaline Phosphatase: 62 U/L (ref 38–126)
Anion gap: 7 (ref 5–15)
BUN: 15 mg/dL (ref 6–20)
CO2: 26 mmol/L (ref 22–32)
Calcium: 9 mg/dL (ref 8.9–10.3)
Chloride: 103 mmol/L (ref 101–111)
Creatinine, Ser: 1.1 mg/dL (ref 0.61–1.24)
GFR calc Af Amer: >60 mL/min (ref >60)
GFR calc non Af Amer: 59 mL/min — ABNORMAL LOW (ref >60)
Glucose, Bld: 122 mg/dL — ABNORMAL HIGH (ref 65–99)
Potassium: 4 mmol/L (ref 3.5–5.1)
Sodium: 136 mmol/L (ref 135–145)
Total Bilirubin: 0.6 mg/dL (ref 0.3–1.2)
Total Protein: 6.9 g/dL (ref 6.5–8.1)

## 2017-08-12 LAB — CBC WITH DIFFERENTIAL/PLATELET
Basophils Absolute: 0.1 10*3/uL (ref 0–0.1)
Basophils Relative: 1 %
Eosinophils Absolute: 0.1 10*3/uL (ref 0–0.7)
Eosinophils Relative: 2 %
HCT: 31.6 % — ABNORMAL LOW (ref 40.0–52.0)
Hemoglobin: 10.4 g/dL — ABNORMAL LOW (ref 13.0–18.0)
Lymphocytes Relative: 7 %
Lymphs Abs: 0.4 10*3/uL — ABNORMAL LOW (ref 1.0–3.6)
MCH: 28.7 pg (ref 26.0–34.0)
MCHC: 32.8 g/dL (ref 32.0–36.0)
MCV: 87.3 fL (ref 80.0–100.0)
Monocytes Absolute: 0.4 10*3/uL (ref 0.2–1.0)
Monocytes Relative: 6 %
Neutro Abs: 5.4 10*3/uL (ref 1.4–6.5)
Neutrophils Relative %: 84 %
Platelets: 202 10*3/uL (ref 150–440)
RBC: 3.62 MIL/uL — ABNORMAL LOW (ref 4.40–5.90)
RDW: 14.1 % (ref 11.5–14.5)
WBC: 6.5 10*3/uL (ref 3.8–10.6)

## 2017-08-12 LAB — MAGNESIUM: Magnesium: 1.6 mg/dL — ABNORMAL LOW (ref 1.7–2.4)

## 2017-08-12 MED ORDER — FAMOTIDINE IN NACL 20-0.9 MG/50ML-% IV SOLN
20.0000 mg | Freq: Once | INTRAVENOUS | Status: AC
  Administered 2017-08-12: 20 mg via INTRAVENOUS
  Filled 2017-08-12: qty 50

## 2017-08-12 MED ORDER — PALONOSETRON HCL INJECTION 0.25 MG/5ML
0.2500 mg | Freq: Once | INTRAVENOUS | Status: AC
  Administered 2017-08-12: 0.25 mg via INTRAVENOUS
  Filled 2017-08-12: qty 5

## 2017-08-12 MED ORDER — DIPHENHYDRAMINE HCL 50 MG/ML IJ SOLN
50.0000 mg | Freq: Once | INTRAMUSCULAR | Status: AC
  Administered 2017-08-12: 50 mg via INTRAVENOUS
  Filled 2017-08-12: qty 1

## 2017-08-12 MED ORDER — PANTOPRAZOLE SODIUM 40 MG PO TBEC: 40.0000 mg | DELAYED_RELEASE_TABLET | Freq: Two times a day (BID) | ORAL | 1 refills | Status: DC

## 2017-08-12 MED ORDER — SODIUM CHLORIDE 0.9 % IV SOLN
20.0000 mg | Freq: Once | INTRAVENOUS | Status: AC
  Administered 2017-08-12: 20 mg via INTRAVENOUS
  Filled 2017-08-12: qty 2

## 2017-08-12 MED ORDER — SODIUM CHLORIDE 0.9 % IV SOLN
140.0000 mg | Freq: Once | INTRAVENOUS | Status: AC
  Administered 2017-08-12: 140 mg via INTRAVENOUS
  Filled 2017-08-12: qty 14

## 2017-08-12 MED ORDER — MAGNESIUM SULFATE 50 % IJ SOLN
1.0000 g | Freq: Once | INTRAMUSCULAR | Status: AC
  Administered 2017-08-12: 1 g via INTRAVENOUS
  Filled 2017-08-12: qty 2

## 2017-08-12 MED ORDER — DEXTROSE 5 % IV SOLN
50.0000 mg/m2 | Freq: Once | INTRAVENOUS | Status: AC
  Administered 2017-08-12: 90 mg via INTRAVENOUS
  Filled 2017-08-12: qty 15

## 2017-08-12 MED ORDER — SODIUM CHLORIDE 0.9 % IV SOLN
Freq: Once | INTRAVENOUS | Status: AC
  Administered 2017-08-12: 12:00:00 via INTRAVENOUS
  Filled 2017-08-12: qty 1000

## 2017-08-12 NOTE — Progress Notes (Signed)
[facility] day:  08/12/2017  Chief Complaint: Aaron Becker is a 83 y.o. male with esophageal carcinoma who is seen for assessment prior to week #3 carboplatin and Taxol with radiation.  HPI:  The patient was last seen in the medical  oncology clinic on 08/05/2017.  At that time, he was doing well.  He denied any dysphagia.  Exam was unremarkable.  Labs were stable.  He received cycle #2.  During the interim, patient has done well with his chemotherapy and radiation treatments. Patient denies any physical complaints. He is not experiencing any nausea or vomiting. Patient has had no B symptoms. Patient has no dysphagia. He denies any neuropathy. He continues to eat well, with no demonstrated weight loss.    Past Medical History:  Diagnosis Date  . Anemia   . Arthritis    hands  . Bradycardia   . Colon polyp   . Diabetes mellitus, type II ([place]) 1986   oral meds  . Elevated PSA 2011   per Dr. Jacqlyn Larsen with Uro no problems last 5 or 6 yrs  . Esophageal cancer ([place]) dx sept 18 2018  . HOH (hard of hearing)    bilateral AIDES  . Hyperlipidemia   . Hypertension 02/02   off bp meds since hospitalization 3 weeks ago  . Hypothyroidism 1980's  . Palpitations   . Right rib fracture 2015  . SVT (supraventricular tachycardia) ([place])    a. reported SVT during admission at [facility] 06/2014  . Syncope and collapse    none recent last few weeks  . Wears dentures    upper and lower    Past Surgical History:  Procedure Laterality Date  . [place], 09/2015   COMPRESSED FRACTURE SPINE  . carotid ultrasound  02/02   wnl  . CATARACT EXTRACTION PHACO AND INTRAOCULAR LENS PLACEMENT (IOC) Left 04/23/2016   Performed by Ronnell Freshwater, MD at [place]  . CATARACT EXTRACTION PHACO AND INTRAOCULAR LENS PLACEMENT (IOC) Right 03/19/2016   Performed by Ronnell Freshwater, MD at [place]  . CHOLECYSTECTOMY  1967  .  COLONOSCOPY W/ POLYPECTOMY  2012   Villous adenoma from the rectum without high-grade dysplasia, 10 mm  . COLONOSCOPY WITH PROPOFOL N/A 03/30/2015   Performed by Robert Bellow, MD at [place]  . CYST EXCISION     thumb  . CYSTECTOMY  06/14/04   mucous cyst excision with left thumb, IP joint debridement  . ESOPHAGOGASTRODUODENOSCOPY (EGD) WITH PROPOFOL N/A 06/11/2017   Performed by Lucilla Lame, MD at [place]  . FLEXIBLE SIGMOIDOSCOPY N/A 06/11/2017   Performed by Lucilla Lame, MD at [place]  . HERNIA REPAIR  2013  . PROSTATE BIOPSY  11/01/08   Dr. Reece Agar  . Thoracic twelve kyphoplasty N/A 10/06/2015   Performed by Karie Chimera, MD at [facility] NEURO ORS  . UPPER ENDOSCOPIC ULTRASOUND (EUS) RADIAL N/A 07/04/2017   Performed by [name], MD at [place]  . [place]  . VASECTOMY  1975    Family History  Problem Relation Age of Onset  . Cancer Father        liver  . Cancer Sister        breast  . Cancer Brother        skin  . Cancer Sister        breast  . Heart disease Sister  pacer  . Heart disease Sister   . Cancer Sister        breast cancer  . Heart disease Sister   . Colon cancer Neg Hx   . Prostate cancer Neg Hx     Social History:  reports that  has never smoked. he has never used smokeless tobacco. He reports that he does not drink alcohol or use drugs.  Retired Agricultural consultant; worked for [employer] for 44 years.  He was in the [employer].  He denies any exposure to radiation or toxins.  The patient is accompanied by 3 children today. He is accompanied by his three children today.  Allergies:  Allergies  Allergen Reactions  . Flomax [Tamsulosin Hcl] Other (See Comments)    syncope  . Ibuprofen Other (See Comments)    Oral blisters at high doses  . Rosuvastatin     REACTION: muscle stiffness  . Sulfa Antibiotics Rash  . Sulfonamide Derivatives Rash    Current Medications: Current Outpatient  Medications  Medication Sig Dispense Refill  . acetaminophen (TYLENOL) 500 MG tablet Take 500-1,000 mg by mouth every 6 (six) hours as needed (for pain.).    Marland Kitchen Ascorbic Acid (VITAMIN C) 1000 MG tablet Take 1,000 mg by mouth daily.    . diphenhydrAMINE (BENADRYL) 25 mg capsule Take 25 mg by mouth at bedtime as needed (for sleep.).     Marland Kitchen docusate sodium (COLACE) 100 MG capsule Take 100 mg by mouth 2 (two) times daily as needed (for constipation).    . ferrous sulfate 325 (65 FE) MG tablet Take 1 tablet (325 mg total) by mouth daily with breakfast.    . folic acid (FOLVITE) 945 MCG tablet Take 400 mcg by mouth daily with lunch.     . glimepiride (AMARYL) 1 MG tablet Take 2 tablets (2 mg total) by mouth daily with breakfast. 180 tablet 3  . glucose blood (ONE TOUCH ULTRA TEST) test strip TO TEST BLOOD SUGAR ONCE OR TWICE DAILY AND AS DIRECTED DX E11.9.  H/o variable blood sugars. (Patient taking differently: 1 each by Other route 2 (two) times daily. TO TEST BLOOD SUGAR ONCE OR TWICE DAILY AND AS DIRECTED DX E11.9.  H/o variable blood sugars.) 200 each 3  . levothyroxine (SYNTHROID, LEVOTHROID) 75 MCG tablet Take 1 tablet (75 mcg total) by mouth daily. (Patient taking differently: Take 75 mcg by mouth daily before breakfast. ) 90 tablet 3  . metFORMIN (GLUCOPHAGE) 500 MG tablet Take 1-2 tablets (500-1,000 mg total) by mouth 2 (two) times daily with a meal. 500 mg every morning and 1000 mg daily with supper (may take 1 extra tablet at night if needed)    . Multiple Vitamin (MULTIVITAMIN WITH MINERALS) TABS tablet Take 1 tablet by mouth daily. Centrum Silver    . ondansetron (ZOFRAN) 8 MG tablet Take 1 tablet (8 mg total) by mouth 2 (two) times daily as needed for refractory nausea / vomiting. Start on day 3 after chemo. 30 tablet 1  . pantoprazole (PROTONIX) 40 MG tablet Take 1 tablet (40 mg total) 2 (two) times daily by mouth. 180 tablet 1  . pravastatin (PRAVACHOL) 80 MG tablet Take 1 tablet (80 mg  total) by mouth at bedtime.    . pyridOXINE (VITAMIN B-6) 100 MG tablet Take 100 mg by mouth 3 (three) times a week.      No current facility-administered medications for this visit.    Facility-Administered Medications Ordered in Other Visits  Medication Dose Route  Frequency Provider Last Rate Last Dose  . CARBOplatin (PARAPLATIN) 140 mg in sodium chloride 0.9 % 250 mL chemo infusion  140 mg Intravenous Once Stacie Knutzen C, MD      . magnesium sulfate 1 g in sodium chloride 0.9 % 50 mL IVPB  1 g Intravenous Once Ayra Hodgdon C, MD      . PACLitaxel (TAXOL) 90 mg in dextrose 5 % 250 mL chemo infusion (</= 96m/m2)  50 mg/m2 (Treatment Plan Recorded) Intravenous Once CLequita Asal MD 265 mL/hr at 08/12/17 1248 90 mg at 08/12/17 1248    Review of Systems:  GENERAL:  Feels "good". No fevers or sweats. Weight up 3 pounds since last visit.  PERFORMANCE STATUS (ECOG):  2 HEENT:  Occupation related hearing loss.  Bilateral hearing aids.  No visual changes, runny nose, sore throat, mouth sores or tenderness. Lungs: No shortness of breath.  No hemoptysis. Cardiac:  No chest pain, palpitations, orthopnea, or PND. GI:  No swallowing difficulties.  Little constipation.  No nausea, vomiting, diarrhea, melena or hematochezia. GU:  No urgency, frequency, dysuria, or hematuria. Musculoskeletal:  No back pain.  No joint pain.  No muscle tenderness. Extremities:  Arthritis in hands; joints stiff and "ache". No swelling. Skin:  No rashes or skin changes. Neuro:  No headache, numbness or weakness, balance or coordination issues. Endocrine:  Diabetes, controlled.  Hypothyroid on supplementation.  No hot flashes or night sweats. Psych:  No mood changes, depression or anxiety. Pain:  No focal pain. Review of systems:  All other systems reviewed and found to be negative.  Physical Exam: Blood pressure (!) 157/83, pulse 81, temperature 98.8 F (37.1 C), temperature source Tympanic, resp. rate  18, weight 153 lb 8 oz (69.6 kg). GENERAL:  Thin elderly gentleman sitting comfortably in the exam room in no acute distress. MENTAL STATUS:  Alert and oriented to person, place and time. HEAD:  GPearline Cablesthin hair.  Normocephalic, atraumatic, face symmetric, no Cushingoid features. EYES:  Glasses.  Blue eyes.  Pupils equal round and reactive to light and accomodation.  No conjunctivitis or scleral icterus. ENT:  Oropharynx clear without lesion.  Tongue normal.  Dentures.  Mucous membranes moist.  RESPIRATORY:  Clear to auscultation without rales, wheezes or rhonchi. CARDIOVASCULAR:  Regular rate and rhythm without murmur, rub or gallop. ABDOMEN:  Soft, non-tender, with active bowel sounds, and no hepatosplenomegaly.  No masses. SKIN:  No rashes, ulcers or lesions. EXTREMITIES: Arthritic nodules in hands. No edema, no skin discoloration or tenderness.  No palpable cords. LYMPH NODES: No palpable cervical, supraclavicular, axillary or inguinal adenopathy  NEUROLOGICAL: Unremarkable. PSYCH:  Appropriate.   Appointment on 08/12/2017  Component Date Value Ref Range Status  . Sodium 08/12/2017 136  135 - 145 mmol/L Final  . Potassium 08/12/2017 4.0  3.5 - 5.1 mmol/L Final  . Chloride 08/12/2017 103  101 - 111 mmol/L Final  . CO2 08/12/2017 26  22 - 32 mmol/L Final  . Glucose, Bld 08/12/2017 122* 65 - 99 mg/dL Final  . BUN 08/12/2017 15  6 - 20 mg/dL Final  . Creatinine, Ser 08/12/2017 1.10  0.61 - 1.24 mg/dL Final  . Calcium 08/12/2017 9.0  8.9 - 10.3 mg/dL Final  . Total Protein 08/12/2017 6.9  6.5 - 8.1 g/dL Final  . Albumin 08/12/2017 3.5  3.5 - 5.0 g/dL Final  . AST 08/12/2017 24  15 - 41 U/L Final  . ALT 08/12/2017 22  17 - 63 U/L Final  .  Alkaline Phosphatase 08/12/2017 62  38 - 126 U/L Final  . Total Bilirubin 08/12/2017 0.6  0.3 - 1.2 mg/dL Final  . GFR calc non Af Amer 08/12/2017 59* >60 mL/min Final  . GFR calc Af Amer 08/12/2017 >60  >60 mL/min Final   Comment: (NOTE) The eGFR has  been calculated using the CKD EPI equation. This calculation has not been validated in all clinical situations. eGFR's persistently <60 mL/min signify possible Chronic Kidney Disease.   . Anion gap 08/12/2017 7  5 - 15 Final  . WBC 08/12/2017 6.5  3.8 - 10.6 K/uL Final  . RBC 08/12/2017 3.62* 4.40 - 5.90 MIL/uL Final  . Hemoglobin 08/12/2017 10.4* 13.0 - 18.0 g/dL Final  . HCT 08/12/2017 31.6* 40.0 - 52.0 % Final  . MCV 08/12/2017 87.3  80.0 - 100.0 fL Final  . MCH 08/12/2017 28.7  26.0 - 34.0 pg Final  . MCHC 08/12/2017 32.8  32.0 - 36.0 g/dL Final  . RDW 08/12/2017 14.1  11.5 - 14.5 % Final  . Platelets 08/12/2017 202  150 - 440 K/uL Final  . Neutrophils Relative % 08/12/2017 84  % Final  . Neutro Abs 08/12/2017 5.4  1.4 - 6.5 K/uL Final  . Lymphocytes Relative 08/12/2017 7  % Final  . Lymphs Abs 08/12/2017 0.4* 1.0 - 3.6 K/uL Final  . Monocytes Relative 08/12/2017 6  % Final  . Monocytes Absolute 08/12/2017 0.4  0.2 - 1.0 K/uL Final  . Eosinophils Relative 08/12/2017 2  % Final  . Eosinophils Absolute 08/12/2017 0.1  0 - 0.7 K/uL Final  . Basophils Relative 08/12/2017 1  % Final  . Basophils Absolute 08/12/2017 0.1  0 - 0.1 K/uL Final  . Magnesium 08/12/2017 1.6* 1.7 - 2.4 mg/dL Final    Assessment:  Aaron Becker is a 83 y.o. male with clinical stage II esophageal cancer (uT2N0).  He presented with an upper GI bleed.  EGD on 06/11/2017 revealed one cratered esophageal ulcer and stigmata of recent esophogeal bleeding at the GE junction.  Biopsy revealed poorly differentiated adenocarcinoma, signet ring type.  There was one non-bleeding cratered gastric ulcer with no stigmata of bleeding in the gastric antrum.  Duodenum was normal.  Colonoscopy on 03/30/2015 revealed a 20 mm tubulovillous adenoma of the rectum.  Flexible sigmoidoscopy on 06/01/2017 revealed a sessile polyp in the rectum and grade II non-bleeding internal hemorrhoids.  PET scan  on 06/27/2017 that revealed no  appreciable abnormal esophageal hypermetabolic activity to correspond with the reported tumor. There was no adenopathy or other findings of metastatic disease. There was question that the original tumor is either low-grade or very small.  Upper endoscopic ultrasound (EUS) on 07/04/2017 revealed a 1-2cm ulcerated mass at the GE junction that was partially circumferential and nonobstructive. The mass is 1 cm in thickness and occupies 3/4 of the  lumen circumference at the GE junction. The mass clearly passes into, but not through the muscularis propria layer of the esophageal wall. There was no paraesophageal adenopathy. Clinical stage was uT2N0 GE junction adenocarcinoma.  He is s/p week #2 carboplatin and Taxol with radiation (07/29/2017 - 08/05/2017).  Symptomatically, he is doing well following his second week of concurrent chemotherapy and radiation.  He denies any dysphagia.  He denies any melena or hematochezia.  Exam is unremarkable.  Magnesium is 1.6 (slightly low).   Plan: 1.  Labs today:  CBC with diff, CMP, Mg.  2.  Week #3 carboplatin and Taxol. 3.  Magnesium sulfate  1 gm IV. 4.  Continue daily radiation per Dr. Baruch Gouty.  5.  RTC in 1 week for MD assessment, labs (CBC with diff, CMP, Mg), and week # 4 of carboplatin + Taxol.     Lequita Asal, MD  08/12/2017, 1:19 PM   I saw and evaluated the patient, participating in the key portions of the service and reviewing pertinent diagnostic studies and records.  I reviewed the nurse practitioner's note and agree with the findings and the plan.  The assessment and plan were discussed with the patient.  A few questions were asked by the patient and his family and answered.   Nolon Stalls, MD 08/12/2017, 1:19 PM

## 2017-08-12 NOTE — Progress Notes (Signed)
Pt in today for lab, MD and chemo.  Children with patient, pt and caregivers deny any concerns today. Requested protonix be called into [facility] in [place].

## 2017-08-13 ENCOUNTER — Ambulatory Visit
Admission: RE | Admit: 2017-08-13 | Discharge: 2017-08-13 | Disposition: A | Discharge status: Home / Self Care | Payer: Medicare Other | Source: Ambulatory Visit | Attending: Radiation Oncology | Admitting: Radiation Oncology

## 2017-08-13 DIAGNOSIS — C155 Malignant neoplasm of lower third of esophagus: Secondary | ICD-10-CM | POA: Diagnosis not present

## 2017-08-14 ENCOUNTER — Other Ambulatory Visit: Payer: Medicare Other

## 2017-08-14 ENCOUNTER — Ambulatory Visit: Payer: Medicare Other

## 2017-08-14 ENCOUNTER — Ambulatory Visit
Admission: RE | Admit: 2017-08-14 | Discharge: 2017-08-14 | Disposition: A | Discharge status: Home / Self Care | Payer: Medicare Other | Source: Ambulatory Visit | Attending: Radiation Oncology | Admitting: Radiation Oncology

## 2017-08-14 DIAGNOSIS — C155 Malignant neoplasm of lower third of esophagus: Secondary | ICD-10-CM | POA: Diagnosis not present

## 2017-08-17 NOTE — Progress Notes (Signed)
[facility] day:  08/19/2017  Chief Complaint: Aaron Becker is a 83 y.o. male with esophageal carcinoma who is seen for assessment prior to week #4 carboplatin and Taxol with radiation.  HPI:  The patient was last seen in the medical  oncology clinic on 08/12/2017.  At that time, he was doing well.  He denied any dysphagia.  Exam was unremarkable.  Labs were stable.  He received cycle #3.  During the interim, patient notes some post radiation sore throat. He reports pain rated 5/10 today. Patient is experiencing dysgeusia; states, "A banana tastes like vinegar". Patient has lost 3 pounds. He denies B symptoms or interval infections.    Past Medical History:  Diagnosis Date  . Anemia   . Arthritis    hands  . Bradycardia   . Colon polyp   . Diabetes mellitus, type II ([place]) 1986   oral meds  . Elevated PSA 2011   per Dr. Jacqlyn Larsen with Uro no problems last 5 or 6 yrs  . Esophageal cancer ([place]) dx sept 18 2018  . HOH (hard of hearing)    bilateral AIDES  . Hyperlipidemia   . Hypertension 02/02   off bp meds since hospitalization 3 weeks ago  . Hypothyroidism 1980's  . Palpitations   . Right rib fracture 2015  . SVT (supraventricular tachycardia) ([place])    a. reported SVT during admission at [facility] 06/2014  . Syncope and collapse    none recent last few weeks  . Wears dentures    upper and lower    Past Surgical History:  Procedure Laterality Date  . [place], 09/2015   COMPRESSED FRACTURE SPINE  . carotid ultrasound  02/02   wnl  . CATARACT EXTRACTION W/PHACO Right 03/19/2016   Procedure: CATARACT EXTRACTION PHACO AND INTRAOCULAR LENS PLACEMENT (IOC);  Surgeon: Ronnell Freshwater, MD;  Location: [place];  Service: Ophthalmology;  Laterality: Right;  DIABETIC-oral med RIGHT  . CATARACT EXTRACTION W/PHACO Left 04/23/2016   Procedure: CATARACT EXTRACTION PHACO AND INTRAOCULAR LENS PLACEMENT (IOC);  Surgeon:  Ronnell Freshwater, MD;  Location: [place];  Service: Ophthalmology;  Laterality: Left;  LEFT DIABETIC - oral meds  . CHOLECYSTECTOMY  1967  . COLONOSCOPY W/ POLYPECTOMY  2012   Villous adenoma from the rectum without high-grade dysplasia, 10 mm  . COLONOSCOPY WITH PROPOFOL N/A 03/30/2015   Procedure: COLONOSCOPY WITH PROPOFOL;  Surgeon: Robert Bellow, MD;  Location: [facility] ENDOSCOPY;  Service: Endoscopy;  Laterality: N/A;  . CYST EXCISION     thumb  . CYSTECTOMY  06/14/04   mucous cyst excision with left thumb, IP joint debridement  . ESOPHAGOGASTRODUODENOSCOPY (EGD) WITH PROPOFOL N/A 06/11/2017   Procedure: ESOPHAGOGASTRODUODENOSCOPY (EGD) WITH PROPOFOL;  Surgeon: Lucilla Lame, MD;  Location: [facility] ENDOSCOPY;  Service: Endoscopy;  Laterality: N/A;  . EUS N/A 07/04/2017   Procedure: UPPER ENDOSCOPIC ULTRASOUND (EUS) RADIAL;  Surgeon: [name], MD;  Location: [facility] ENDOSCOPY;  Service: Endoscopy;  Laterality: N/A;  . FLEXIBLE SIGMOIDOSCOPY N/A 06/11/2017   Procedure: FLEXIBLE SIGMOIDOSCOPY;  Surgeon: Lucilla Lame, MD;  Location: [facility] ENDOSCOPY;  Service: Endoscopy;  Laterality: N/A;  . HERNIA REPAIR  2013  . KYPHOPLASTY N/A 10/06/2015   Procedure: Thoracic twelve kyphoplasty;  Surgeon: Karie Chimera, MD;  Location: [place] NEURO ORS;  Service: Neurosurgery;  Laterality: N/A;  T12 Kyphoplasty  . PROSTATE BIOPSY  11/01/08   Dr. Reece Agar  . VARICOSE VEIN SURGERY  25  . VASECTOMY  1975    Family History  Problem Relation Age of Onset  . Cancer Father        liver  . Cancer Sister        breast  . Cancer Brother        skin  . Cancer Sister        breast  . Heart disease Sister        pacer  . Heart disease Sister   . Cancer Sister        breast cancer  . Heart disease Sister   . Colon cancer Neg Hx   . Prostate cancer Neg Hx     Social History:  reports that  has never smoked. he has never used smokeless tobacco. He reports that he does not drink alcohol or  use drugs.  Retired Agricultural consultant; worked for [employer] for 44 years.  He was in the [employer].  He denies any exposure to radiation or toxins.  The patient is accompanied by 3 children today. He is accompanied by his son and daughter today.  Allergies:  Allergies  Allergen Reactions  . Flomax [Tamsulosin Hcl] Other (See Comments)    syncope  . Ibuprofen Other (See Comments)    Oral blisters at high doses  . Rosuvastatin     REACTION: muscle stiffness  . Sulfa Antibiotics Rash  . Sulfonamide Derivatives Rash    Current Medications: Current Outpatient Medications  Medication Sig Dispense Refill  . acetaminophen (TYLENOL) 500 MG tablet Take 500-1,000 mg by mouth every 6 (six) hours as needed (for pain.).    Marland Kitchen Ascorbic Acid (VITAMIN C) 1000 MG tablet Take 1,000 mg by mouth daily.    . benzonatate (TESSALON) 100 MG capsule Take 100 mg by mouth at bedtime as needed for cough.    . Chlorpheniramine-DM (CORICIDIN HBP COUGH/COLD PO) Take by mouth 4 (four) times daily as needed.    . diphenhydrAMINE (BENADRYL) 25 mg capsule Take 25 mg by mouth at bedtime as needed (for sleep.).     Marland Kitchen docusate sodium (COLACE) 100 MG capsule Take 100 mg by mouth 2 (two) times daily as needed (for constipation).    . ferrous sulfate 325 (65 FE) MG tablet Take 1 tablet (325 mg total) by mouth daily with breakfast.    . folic acid (FOLVITE) 109 MCG tablet Take 400 mcg by mouth daily with lunch.     . glimepiride (AMARYL) 1 MG tablet Take 2 tablets (2 mg total) by mouth daily with breakfast. 180 tablet 3  . glucose blood (ONE TOUCH ULTRA TEST) test strip TO TEST BLOOD SUGAR ONCE OR TWICE DAILY AND AS DIRECTED DX E11.9.  H/o variable blood sugars. (Patient taking differently: 1 each by Other route 2 (two) times daily. TO TEST BLOOD SUGAR ONCE OR TWICE DAILY AND AS DIRECTED DX E11.9.  H/o variable blood sugars.) 200 each 3  . levothyroxine (SYNTHROID, LEVOTHROID) 75 MCG tablet Take 1 tablet (75 mcg total) by  mouth daily. (Patient taking differently: Take 75 mcg by mouth daily before breakfast. ) 90 tablet 3  . metFORMIN (GLUCOPHAGE) 500 MG tablet Take 1-2 tablets (500-1,000 mg total) by mouth 2 (two) times daily with a meal. 500 mg every morning and 1000 mg daily with supper (may take 1 extra tablet at night if needed)    . Multiple Vitamin (MULTIVITAMIN WITH MINERALS) TABS tablet Take 1 tablet by mouth daily. Centrum Silver    . ondansetron (  ZOFRAN) 8 MG tablet Take 1 tablet (8 mg total) by mouth 2 (two) times daily as needed for refractory nausea / vomiting. Start on day 3 after chemo. 30 tablet 1  . pantoprazole (PROTONIX) 40 MG tablet Take 1 tablet (40 mg total) 2 (two) times daily by mouth. 180 tablet 1  . pravastatin (PRAVACHOL) 80 MG tablet Take 1 tablet (80 mg total) by mouth at bedtime.    . pyridOXINE (VITAMIN B-6) 100 MG tablet Take 100 mg by mouth 3 (three) times a week.      No current facility-administered medications for this visit.     Review of Systems:  GENERAL:  Feels "ok". No fevers or sweats. Weight down 3 pounds since last visit.  PERFORMANCE STATUS (ECOG):  2 HEENT:  Sore throat. Dysgeusia.  Occupation related hearing loss.  Bilateral hearing aids.  No visual changes, runny nose, mouth sores or tenderness. Lungs: No shortness of breath.  No hemoptysis. Cardiac:  No chest pain, palpitations, orthopnea, or PND. GI:  No swallowing difficulties.  Little constipation.  No nausea, vomiting, diarrhea, melena or hematochezia. GU:  No urgency, frequency, dysuria, or hematuria. Musculoskeletal:  No back pain.  No joint pain.  No muscle tenderness. Extremities:  Arthritis in hands; joints stiff and "ache". No swelling. Skin:  No rashes or skin changes. Neuro:  No headache, numbness or weakness, balance or coordination issues. Endocrine:  Diabetes, controlled.  Hypothyroid on supplementation.  No hot flashes or night sweats. Psych:  No mood changes, depression or anxiety. Pain:  No  focal pain. Review of systems:  All other systems reviewed and found to be negative.  Physical Exam: Blood pressure (!) 176/76, pulse 82, temperature 99 F (37.2 C), temperature source Tympanic, resp. rate 18, weight 150 lb 7 oz (68.2 kg). GENERAL:  Thin elderly gentleman sitting comfortably in the exam room in no acute distress. MENTAL STATUS:  Alert and oriented to person, place and time. HEAD:  Pearline Cables thin hair.  Normocephalic, atraumatic, face symmetric, no Cushingoid features. EYES:  Glasses.  Blue eyes.  Pupils equal round and reactive to light and accomodation.  No conjunctivitis or scleral icterus. ENT:  Oropharynx clear without lesion.  Tongue normal.  Dentures.  Mucous membranes moist.  RESPIRATORY:  Clear to auscultation without rales, wheezes or rhonchi. CARDIOVASCULAR:  Regular rate and rhythm without murmur, rub or gallop. ABDOMEN:  Soft, non-tender, with active bowel sounds, and no hepatosplenomegaly.  No masses. SKIN:  No rashes, ulcers or lesions. EXTREMITIES: Arthritic nodules in hands. No edema, no skin discoloration or tenderness.  No palpable cords. LYMPH NODES: No palpable cervical, supraclavicular, axillary or inguinal adenopathy  NEUROLOGICAL: Unremarkable. PSYCH:  Appropriate.   Appointment on 08/19/2017  Component Date Value Ref Range Status  . Magnesium 08/19/2017 1.7  1.7 - 2.4 mg/dL Final  . Sodium 08/19/2017 135  135 - 145 mmol/L Final  . Potassium 08/19/2017 3.8  3.5 - 5.1 mmol/L Final  . Chloride 08/19/2017 102  101 - 111 mmol/L Final  . CO2 08/19/2017 26  22 - 32 mmol/L Final  . Glucose, Bld 08/19/2017 165* 65 - 99 mg/dL Final  . BUN 08/19/2017 17  6 - 20 mg/dL Final  . Creatinine, Ser 08/19/2017 1.16  0.61 - 1.24 mg/dL Final  . Calcium 08/19/2017 9.1  8.9 - 10.3 mg/dL Final  . Total Protein 08/19/2017 6.9  6.5 - 8.1 g/dL Final  . Albumin 08/19/2017 3.6  3.5 - 5.0 g/dL Final  . AST 08/19/2017 25  15 - 41 U/L Final  . ALT 08/19/2017 22  17 - 63 U/L  Final  . Alkaline Phosphatase 08/19/2017 55  38 - 126 U/L Final  . Total Bilirubin 08/19/2017 0.7  0.3 - 1.2 mg/dL Final  . GFR calc non Af Amer 08/19/2017 56* >60 mL/min Final  . GFR calc Af Amer 08/19/2017 >60  >60 mL/min Final   Comment: (NOTE) The eGFR has been calculated using the CKD EPI equation. This calculation has not been validated in all clinical situations. eGFR's persistently <60 mL/min signify possible Chronic Kidney Disease.   . Anion gap 08/19/2017 7  5 - 15 Final  . WBC 08/19/2017 5.9  3.8 - 10.6 K/uL Final  . RBC 08/19/2017 3.70* 4.40 - 5.90 MIL/uL Final  . Hemoglobin 08/19/2017 10.7* 13.0 - 18.0 g/dL Final  . HCT 08/19/2017 32.3* 40.0 - 52.0 % Final  . MCV 08/19/2017 87.3  80.0 - 100.0 fL Final  . MCH 08/19/2017 29.1  26.0 - 34.0 pg Final  . MCHC 08/19/2017 33.3  32.0 - 36.0 g/dL Final  . RDW 08/19/2017 14.5  11.5 - 14.5 % Final  . Platelets 08/19/2017 239  150 - 440 K/uL Final  . Neutrophils Relative % 08/19/2017 82  % Final  . Neutro Abs 08/19/2017 4.8  1.4 - 6.5 K/uL Final  . Lymphocytes Relative 08/19/2017 7  % Final  . Lymphs Abs 08/19/2017 0.4* 1.0 - 3.6 K/uL Final  . Monocytes Relative 08/19/2017 7  % Final  . Monocytes Absolute 08/19/2017 0.4  0.2 - 1.0 K/uL Final  . Eosinophils Relative 08/19/2017 2  % Final  . Eosinophils Absolute 08/19/2017 0.1  0 - 0.7 K/uL Final  . Basophils Relative 08/19/2017 2  % Final  . Basophils Absolute 08/19/2017 0.1  0 - 0.1 K/uL Final    Assessment:  Victormanuel E Schwertner is a 83 y.o. male with clinical stage II esophageal cancer (uT2N0).  He presented with an upper GI bleed.  EGD on 06/11/2017 revealed one cratered esophageal ulcer and stigmata of recent esophogeal bleeding at the GE junction.  Biopsy revealed poorly differentiated adenocarcinoma, signet ring type.  There was one non-bleeding cratered gastric ulcer with no stigmata of bleeding in the gastric antrum.  Duodenum was normal.  Colonoscopy on 03/30/2015 revealed a 20  mm tubulovillous adenoma of the rectum.  Flexible sigmoidoscopy on 06/01/2017 revealed a sessile polyp in the rectum and grade II non-bleeding internal hemorrhoids.  PET scan  on 06/27/2017 that revealed no appreciable abnormal esophageal hypermetabolic activity to correspond with the reported tumor. There was no adenopathy or other findings of metastatic disease. There was question that the original tumor is either low-grade or very small.  Upper endoscopic ultrasound (EUS) on 07/04/2017 revealed a 1-2cm ulcerated mass at the GE junction that was partially circumferential and nonobstructive. The mass is 1 cm in thickness and occupies 3/4 of the  lumen circumference at the GE junction. The mass clearly passes into, but not through the muscularis propria layer of the esophageal wall. There was no paraesophageal adenopathy. Clinical stage was uT2N0 GE junction adenocarcinoma.  He is s/p week #3 carboplatin and Taxol with radiation (07/29/2017 - 08/12/2017).  Symptomatically, he is doing "ok" following his third week of concurrent chemotherapy and radiation.  He notes sore throat associated with radiation.  He notes increasing dysgeusia. He denies any melena or hematochezia.  Exam is unremarkable. Labs unremarkable.   Plan: 1.  Labs today:  CBC with diff, CMP, Mg.  2.  Week #4 carboplatin and Taxol. 3.  Discuss weight loss. Patient encouraged to increase protein and calorie intake to the extent possible. Diet will need to consist of soft items and supplemental shakes given the patient's sore throat.  4.  Continue daily radiation per Dr. Baruch Gouty.  5.  RTC in 1 week for MD assessment, labs (CBC with diff, CMP, Mg), and week #5 of carboplatin + Taxol.     Honor Loh, NP  08/19/2017, 9:38 AM   I saw and evaluated the patient, participating in the key portions of the service and reviewing pertinent diagnostic studies and records.  I reviewed the nurse practitioner's note and agree with the findings and  the plan.  The assessment and plan were discussed with the patient.  A few questions were asked by the patient and his family and answered.   Nolon Stalls, MD 08/19/2017, 9:38 AM

## 2017-08-19 ENCOUNTER — Inpatient Hospital Stay (HOSPITAL_BASED_OUTPATIENT_CLINIC_OR_DEPARTMENT_OTHER): Payer: Medicare Other | Admitting: Hematology and Oncology

## 2017-08-19 ENCOUNTER — Ambulatory Visit: Payer: Medicare Other | Admitting: Family Medicine

## 2017-08-19 ENCOUNTER — Ambulatory Visit
Admission: RE | Admit: 2017-08-19 | Discharge: 2017-08-19 | Disposition: A | Discharge status: Home / Self Care | Payer: Medicare Other | Source: Ambulatory Visit | Attending: Radiation Oncology | Admitting: Radiation Oncology

## 2017-08-19 ENCOUNTER — Encounter: Payer: Self-pay | Admitting: Hematology and Oncology

## 2017-08-19 ENCOUNTER — Inpatient Hospital Stay: Payer: Medicare Other

## 2017-08-19 ENCOUNTER — Other Ambulatory Visit: Payer: Self-pay | Admitting: *Deleted

## 2017-08-19 VITALS — BP 176/76 | HR 82 | Temp 99.0°F | Resp 18 | Wt 150.4 lb

## 2017-08-19 DIAGNOSIS — C155 Malignant neoplasm of lower third of esophagus: Secondary | ICD-10-CM | POA: Diagnosis not present

## 2017-08-19 DIAGNOSIS — E785 Hyperlipidemia, unspecified: Secondary | ICD-10-CM | POA: Diagnosis not present

## 2017-08-19 DIAGNOSIS — K621 Rectal polyp: Secondary | ICD-10-CM | POA: Diagnosis not present

## 2017-08-19 DIAGNOSIS — C160 Malignant neoplasm of cardia: Secondary | ICD-10-CM

## 2017-08-19 DIAGNOSIS — I10 Essential (primary) hypertension: Secondary | ICD-10-CM | POA: Diagnosis not present

## 2017-08-19 DIAGNOSIS — R432 Parageusia: Secondary | ICD-10-CM

## 2017-08-19 DIAGNOSIS — C159 Malignant neoplasm of esophagus, unspecified: Secondary | ICD-10-CM

## 2017-08-19 DIAGNOSIS — K648 Other hemorrhoids: Secondary | ICD-10-CM | POA: Diagnosis not present

## 2017-08-19 DIAGNOSIS — E119 Type 2 diabetes mellitus without complications: Secondary | ICD-10-CM

## 2017-08-19 DIAGNOSIS — E039 Hypothyroidism, unspecified: Secondary | ICD-10-CM | POA: Diagnosis not present

## 2017-08-19 DIAGNOSIS — R634 Abnormal weight loss: Secondary | ICD-10-CM | POA: Diagnosis not present

## 2017-08-19 DIAGNOSIS — Z7189 Other specified counseling: Secondary | ICD-10-CM

## 2017-08-19 DIAGNOSIS — Z5111 Encounter for antineoplastic chemotherapy: Secondary | ICD-10-CM

## 2017-08-19 DIAGNOSIS — R070 Pain in throat: Secondary | ICD-10-CM | POA: Diagnosis not present

## 2017-08-19 LAB — COMPREHENSIVE METABOLIC PANEL
ALT: 22 U/L (ref 17–63)
AST: 25 U/L (ref 15–41)
Albumin: 3.6 g/dL (ref 3.5–5.0)
Alkaline Phosphatase: 55 U/L (ref 38–126)
Anion gap: 7 (ref 5–15)
BUN: 17 mg/dL (ref 6–20)
CO2: 26 mmol/L (ref 22–32)
Calcium: 9.1 mg/dL (ref 8.9–10.3)
Chloride: 102 mmol/L (ref 101–111)
Creatinine, Ser: 1.16 mg/dL (ref 0.61–1.24)
GFR calc Af Amer: >60 mL/min (ref >60)
GFR calc non Af Amer: 56 mL/min — ABNORMAL LOW (ref >60)
Glucose, Bld: 165 mg/dL — ABNORMAL HIGH (ref 65–99)
Potassium: 3.8 mmol/L (ref 3.5–5.1)
Sodium: 135 mmol/L (ref 135–145)
Total Bilirubin: 0.7 mg/dL (ref 0.3–1.2)
Total Protein: 6.9 g/dL (ref 6.5–8.1)

## 2017-08-19 LAB — CBC WITH DIFFERENTIAL/PLATELET
Basophils Absolute: 0.1 10*3/uL (ref 0–0.1)
Basophils Relative: 2 %
Eosinophils Absolute: 0.1 10*3/uL (ref 0–0.7)
Eosinophils Relative: 2 %
HCT: 32.3 % — ABNORMAL LOW (ref 40.0–52.0)
Hemoglobin: 10.7 g/dL — ABNORMAL LOW (ref 13.0–18.0)
Lymphocytes Relative: 7 %
Lymphs Abs: 0.4 10*3/uL — ABNORMAL LOW (ref 1.0–3.6)
MCH: 29.1 pg (ref 26.0–34.0)
MCHC: 33.3 g/dL (ref 32.0–36.0)
MCV: 87.3 fL (ref 80.0–100.0)
Monocytes Absolute: 0.4 10*3/uL (ref 0.2–1.0)
Monocytes Relative: 7 %
Neutro Abs: 4.8 10*3/uL (ref 1.4–6.5)
Neutrophils Relative %: 82 %
Platelets: 239 10*3/uL (ref 150–440)
RBC: 3.7 MIL/uL — ABNORMAL LOW (ref 4.40–5.90)
RDW: 14.5 % (ref 11.5–14.5)
WBC: 5.9 10*3/uL (ref 3.8–10.6)

## 2017-08-19 LAB — MAGNESIUM: Magnesium: 1.7 mg/dL (ref 1.7–2.4)

## 2017-08-19 MED ORDER — SODIUM CHLORIDE 0.9 % IV SOLN
20.0000 mg | Freq: Once | INTRAVENOUS | Status: AC
  Administered 2017-08-19: 20 mg via INTRAVENOUS
  Filled 2017-08-19: qty 2

## 2017-08-19 MED ORDER — SODIUM CHLORIDE 0.9 % IV SOLN
138.4000 mg | Freq: Once | INTRAVENOUS | Status: AC
  Administered 2017-08-19: 140 mg via INTRAVENOUS
  Filled 2017-08-19: qty 14

## 2017-08-19 MED ORDER — DIPHENHYDRAMINE HCL 50 MG/ML IJ SOLN
50.0000 mg | Freq: Once | INTRAMUSCULAR | Status: AC
  Administered 2017-08-19: 50 mg via INTRAVENOUS
  Filled 2017-08-19: qty 1

## 2017-08-19 MED ORDER — SODIUM CHLORIDE 0.9 % IV SOLN
Freq: Once | INTRAVENOUS | Status: AC
  Administered 2017-08-19: 11:00:00 via INTRAVENOUS
  Filled 2017-08-19: qty 1000

## 2017-08-19 MED ORDER — PALONOSETRON HCL INJECTION 0.25 MG/5ML
0.2500 mg | Freq: Once | INTRAVENOUS | Status: AC
  Administered 2017-08-19: 0.25 mg via INTRAVENOUS
  Filled 2017-08-19: qty 5

## 2017-08-19 MED ORDER — FAMOTIDINE IN NACL 20-0.9 MG/50ML-% IV SOLN
20.0000 mg | Freq: Once | INTRAVENOUS | Status: AC
  Administered 2017-08-19: 20 mg via INTRAVENOUS
  Filled 2017-08-19: qty 50

## 2017-08-19 MED ORDER — DEXTROSE 5 % IV SOLN
50.0000 mg/m2 | Freq: Once | INTRAVENOUS | Status: AC
  Administered 2017-08-19: 90 mg via INTRAVENOUS
  Filled 2017-08-19: qty 15

## 2017-08-19 NOTE — Progress Notes (Signed)
Pt in today with children, scheduled for labs, chemo and radiation as well as MD.  Pt has lost 3 lbs since in last week.  Pt reports constant sore throat, making it very difficult to eat.  CG report patient does not use any supplemental shakes.  Reports taking new meds coricidin HBP 4x a day and tessalon perles at bedtime.

## 2017-08-20 ENCOUNTER — Ambulatory Visit
Admission: RE | Admit: 2017-08-20 | Discharge: 2017-08-20 | Disposition: A | Discharge status: Home / Self Care | Payer: Medicare Other | Source: Ambulatory Visit | Attending: Radiation Oncology | Admitting: Radiation Oncology

## 2017-08-20 DIAGNOSIS — C155 Malignant neoplasm of lower third of esophagus: Secondary | ICD-10-CM | POA: Diagnosis not present

## 2017-08-21 ENCOUNTER — Ambulatory Visit
Admission: RE | Admit: 2017-08-21 | Discharge: 2017-08-21 | Disposition: A | Discharge status: Home / Self Care | Payer: Medicare Other | Source: Ambulatory Visit | Attending: Radiation Oncology | Admitting: Radiation Oncology

## 2017-08-21 DIAGNOSIS — C155 Malignant neoplasm of lower third of esophagus: Secondary | ICD-10-CM | POA: Diagnosis not present

## 2017-08-22 ENCOUNTER — Ambulatory Visit
Admission: RE | Admit: 2017-08-22 | Discharge: 2017-08-22 | Disposition: A | Discharge status: Home / Self Care | Payer: Medicare Other | Source: Ambulatory Visit | Attending: Radiation Oncology | Admitting: Radiation Oncology

## 2017-08-22 DIAGNOSIS — C155 Malignant neoplasm of lower third of esophagus: Secondary | ICD-10-CM | POA: Diagnosis not present

## 2017-08-23 ENCOUNTER — Ambulatory Visit
Admission: RE | Admit: 2017-08-23 | Discharge: 2017-08-23 | Disposition: A | Discharge status: Home / Self Care | Payer: Medicare Other | Source: Ambulatory Visit | Attending: Radiation Oncology | Admitting: Radiation Oncology

## 2017-08-23 DIAGNOSIS — C155 Malignant neoplasm of lower third of esophagus: Secondary | ICD-10-CM | POA: Diagnosis not present

## 2017-08-25 ENCOUNTER — Ambulatory Visit: Payer: Medicare Other

## 2017-08-26 ENCOUNTER — Inpatient Hospital Stay: Payer: Medicare Other | Attending: Hematology and Oncology

## 2017-08-26 ENCOUNTER — Inpatient Hospital Stay: Payer: Medicare Other

## 2017-08-26 ENCOUNTER — Inpatient Hospital Stay (HOSPITAL_BASED_OUTPATIENT_CLINIC_OR_DEPARTMENT_OTHER): Payer: Medicare Other | Admitting: Hematology and Oncology

## 2017-08-26 ENCOUNTER — Ambulatory Visit
Admission: RE | Admit: 2017-08-26 | Discharge: 2017-08-26 | Disposition: A | Discharge status: Home / Self Care | Payer: Medicare Other | Source: Ambulatory Visit | Attending: Radiation Oncology | Admitting: Radiation Oncology

## 2017-08-26 VITALS — BP 178/82 | HR 82 | Temp 98.7°F | Resp 20 | Wt 150.0 lb

## 2017-08-26 DIAGNOSIS — R634 Abnormal weight loss: Secondary | ICD-10-CM | POA: Diagnosis not present

## 2017-08-26 DIAGNOSIS — Z808 Family history of malignant neoplasm of other organs or systems: Secondary | ICD-10-CM

## 2017-08-26 DIAGNOSIS — Z7984 Long term (current) use of oral hypoglycemic drugs: Secondary | ICD-10-CM

## 2017-08-26 DIAGNOSIS — E119 Type 2 diabetes mellitus without complications: Secondary | ICD-10-CM | POA: Insufficient documentation

## 2017-08-26 DIAGNOSIS — R1310 Dysphagia, unspecified: Secondary | ICD-10-CM | POA: Insufficient documentation

## 2017-08-26 DIAGNOSIS — C159 Malignant neoplasm of esophagus, unspecified: Secondary | ICD-10-CM

## 2017-08-26 DIAGNOSIS — E785 Hyperlipidemia, unspecified: Secondary | ICD-10-CM

## 2017-08-26 DIAGNOSIS — Z5111 Encounter for antineoplastic chemotherapy: Secondary | ICD-10-CM

## 2017-08-26 DIAGNOSIS — I10 Essential (primary) hypertension: Secondary | ICD-10-CM | POA: Insufficient documentation

## 2017-08-26 DIAGNOSIS — Y842 Radiological procedure and radiotherapy as the cause of abnormal reaction of the patient, or of later complication, without mention of misadventure at the time of the procedure: Secondary | ICD-10-CM | POA: Insufficient documentation

## 2017-08-26 DIAGNOSIS — R070 Pain in throat: Secondary | ICD-10-CM | POA: Diagnosis not present

## 2017-08-26 DIAGNOSIS — Z803 Family history of malignant neoplasm of breast: Secondary | ICD-10-CM | POA: Insufficient documentation

## 2017-08-26 DIAGNOSIS — C160 Malignant neoplasm of cardia: Secondary | ICD-10-CM | POA: Diagnosis not present

## 2017-08-26 DIAGNOSIS — Z86010 Personal history of colonic polyps: Secondary | ICD-10-CM | POA: Diagnosis not present

## 2017-08-26 DIAGNOSIS — E039 Hypothyroidism, unspecified: Secondary | ICD-10-CM | POA: Insufficient documentation

## 2017-08-26 DIAGNOSIS — R432 Parageusia: Secondary | ICD-10-CM | POA: Insufficient documentation

## 2017-08-26 DIAGNOSIS — Z79899 Other long term (current) drug therapy: Secondary | ICD-10-CM | POA: Diagnosis not present

## 2017-08-26 DIAGNOSIS — Z882 Allergy status to sulfonamides status: Secondary | ICD-10-CM | POA: Diagnosis not present

## 2017-08-26 DIAGNOSIS — M1990 Unspecified osteoarthritis, unspecified site: Secondary | ICD-10-CM | POA: Diagnosis not present

## 2017-08-26 DIAGNOSIS — K648 Other hemorrhoids: Secondary | ICD-10-CM

## 2017-08-26 DIAGNOSIS — Z7189 Other specified counseling: Secondary | ICD-10-CM

## 2017-08-26 DIAGNOSIS — C155 Malignant neoplasm of lower third of esophagus: Secondary | ICD-10-CM | POA: Diagnosis not present

## 2017-08-26 LAB — COMPREHENSIVE METABOLIC PANEL
ALT: 21 U/L (ref 17–63)
AST: 26 U/L (ref 15–41)
Albumin: 3.6 g/dL (ref 3.5–5.0)
Alkaline Phosphatase: 53 U/L (ref 38–126)
Anion gap: 8 (ref 5–15)
BUN: 20 mg/dL (ref 6–20)
CO2: 25 mmol/L (ref 22–32)
Calcium: 8.9 mg/dL (ref 8.9–10.3)
Chloride: 101 mmol/L (ref 101–111)
Creatinine, Ser: 1.08 mg/dL (ref 0.61–1.24)
GFR calc Af Amer: >60 mL/min (ref >60)
GFR calc non Af Amer: >60 mL/min (ref >60)
Glucose, Bld: 156 mg/dL — ABNORMAL HIGH (ref 65–99)
Potassium: 3.7 mmol/L (ref 3.5–5.1)
Sodium: 134 mmol/L — ABNORMAL LOW (ref 135–145)
Total Bilirubin: 0.6 mg/dL (ref 0.3–1.2)
Total Protein: 6.9 g/dL (ref 6.5–8.1)

## 2017-08-26 LAB — CBC WITH DIFFERENTIAL/PLATELET
Basophils Absolute: 0.1 10*3/uL (ref 0–0.1)
Basophils Relative: 1 %
Eosinophils Absolute: 0.1 10*3/uL (ref 0–0.7)
Eosinophils Relative: 1 %
HCT: 32.3 % — ABNORMAL LOW (ref 40.0–52.0)
Hemoglobin: 10.6 g/dL — ABNORMAL LOW (ref 13.0–18.0)
Lymphocytes Relative: 4 %
Lymphs Abs: 0.3 10*3/uL — ABNORMAL LOW (ref 1.0–3.6)
MCH: 28.9 pg (ref 26.0–34.0)
MCHC: 32.9 g/dL (ref 32.0–36.0)
MCV: 87.7 fL (ref 80.0–100.0)
Monocytes Absolute: 0.3 10*3/uL (ref 0.2–1.0)
Monocytes Relative: 5 %
Neutro Abs: 6.2 10*3/uL (ref 1.4–6.5)
Neutrophils Relative %: 89 %
Platelets: 197 10*3/uL (ref 150–440)
RBC: 3.68 MIL/uL — ABNORMAL LOW (ref 4.40–5.90)
RDW: 15.7 % — ABNORMAL HIGH (ref 11.5–14.5)
WBC: 6.9 10*3/uL (ref 3.8–10.6)

## 2017-08-26 LAB — MAGNESIUM: Magnesium: 1.6 mg/dL — ABNORMAL LOW (ref 1.7–2.4)

## 2017-08-26 MED ORDER — FAMOTIDINE IN NACL 20-0.9 MG/50ML-% IV SOLN
20.0000 mg | Freq: Once | INTRAVENOUS | Status: AC
  Administered 2017-08-26: 20 mg via INTRAVENOUS
  Filled 2017-08-26: qty 50

## 2017-08-26 MED ORDER — MAGNESIUM SULFATE 50 % IJ SOLN
1.0000 g | Freq: Once | INTRAMUSCULAR | Status: AC
  Administered 2017-08-26: 1 g via INTRAVENOUS
  Filled 2017-08-26: qty 2

## 2017-08-26 MED ORDER — SODIUM CHLORIDE 0.9 % IV SOLN
Freq: Once | INTRAVENOUS | Status: AC
  Administered 2017-08-26: 11:00:00 via INTRAVENOUS
  Filled 2017-08-26: qty 1000

## 2017-08-26 MED ORDER — DEXAMETHASONE SODIUM PHOSPHATE 100 MG/10ML IJ SOLN
20.0000 mg | Freq: Once | INTRAMUSCULAR | Status: AC
  Administered 2017-08-26: 20 mg via INTRAVENOUS
  Filled 2017-08-26: qty 2

## 2017-08-26 MED ORDER — PACLITAXEL CHEMO INJECTION 300 MG/50ML
50.0000 mg/m2 | Freq: Once | INTRAVENOUS | Status: AC
  Administered 2017-08-26: 90 mg via INTRAVENOUS
  Filled 2017-08-26: qty 15

## 2017-08-26 MED ORDER — SODIUM CHLORIDE 0.9 % IV SOLN
145.0000 mg | Freq: Once | INTRAVENOUS | Status: AC
  Administered 2017-08-26: 150 mg via INTRAVENOUS
  Filled 2017-08-26: qty 15

## 2017-08-26 MED ORDER — DIPHENHYDRAMINE HCL 50 MG/ML IJ SOLN
50.0000 mg | Freq: Once | INTRAMUSCULAR | Status: AC
  Administered 2017-08-26: 50 mg via INTRAVENOUS
  Filled 2017-08-26: qty 1

## 2017-08-26 MED ORDER — PALONOSETRON HCL INJECTION 0.25 MG/5ML
0.2500 mg | Freq: Once | INTRAVENOUS | Status: AC
  Administered 2017-08-26: 0.25 mg via INTRAVENOUS
  Filled 2017-08-26: qty 5

## 2017-08-26 NOTE — Progress Notes (Signed)
Pt in today for followup.  Reports "doing a little better than last week".  Throat is still sore and making it difficult to eat and drink.  Family requested miracle mouthwash.  MD notified.

## 2017-08-26 NOTE — Progress Notes (Signed)
[facility] day:  08/26/2017  Chief Complaint: Aaron Becker is a 83 y.o. male with esophageal carcinoma who is seen for assessment prior to week #5 carboplatin and Taxol with radiation.  HPI:  The patient was last seen in the medical  oncology clinic on 08/19/2017.  At that time, he noted a sore throat and dysgeusia.  He had lost 3 pounds.  Exam was stable.  Labs were stable. He received week #4 chemotherapy.  Radiation continued.  During the interim, patient is doing "fairly well". Patient notes continued throat irritation. Patient is doing salt water and baking soda gargles. Patient continues on radiation therapy. Patient has  2 weeks and 3 days of radiation remaining. Patient denies other physical concerns. He denies B symptoms and interval infections. Patient is eating well, with no significant weight loss demonstrated.   Patient remains active. He states, "I walk a lot. I am outside a good bit. I don't just sit in the house".    Past Medical History:  Diagnosis Date  . Anemia   . Arthritis    hands  . Bradycardia   . Colon polyp   . Diabetes mellitus, type II ([place]) 1986   oral meds  . Elevated PSA 2011   per Dr. Jacqlyn Larsen with Uro no problems last 5 or 6 yrs  . Esophageal cancer ([place]) dx sept 18 2018  . HOH (hard of hearing)    bilateral AIDES  . Hyperlipidemia   . Hypertension 02/02   off bp meds since hospitalization 3 weeks ago  . Hypothyroidism 1980's  . Palpitations   . Right rib fracture 2015  . SVT (supraventricular tachycardia) ([place])    a. reported SVT during admission at [facility] 06/2014  . Syncope and collapse    none recent last few weeks  . Wears dentures    upper and lower    Past Surgical History:  Procedure Laterality Date  . [place], 09/2015   COMPRESSED FRACTURE SPINE  . carotid ultrasound  02/02   wnl  . CATARACT EXTRACTION W/PHACO Right 03/19/2016   Procedure: CATARACT EXTRACTION PHACO AND  INTRAOCULAR LENS PLACEMENT (IOC);  Surgeon: Ronnell Freshwater, MD;  Location: [facility];  Service: Ophthalmology;  Laterality: Right;  DIABETIC-oral med RIGHT  . CATARACT EXTRACTION W/PHACO Left 04/23/2016   Procedure: CATARACT EXTRACTION PHACO AND INTRAOCULAR LENS PLACEMENT (IOC);  Surgeon: Ronnell Freshwater, MD;  Location: [facility];  Service: Ophthalmology;  Laterality: Left;  LEFT DIABETIC - oral meds  . CHOLECYSTECTOMY  1967  . COLONOSCOPY W/ POLYPECTOMY  2012   Villous adenoma from the rectum without high-grade dysplasia, 10 mm  . COLONOSCOPY WITH PROPOFOL N/A 03/30/2015   Procedure: COLONOSCOPY WITH PROPOFOL;  Surgeon: Robert Bellow, MD;  Location: [facility] ENDOSCOPY;  Service: Endoscopy;  Laterality: N/A;  . CYST EXCISION     thumb  . CYSTECTOMY  06/14/04   mucous cyst excision with left thumb, IP joint debridement  . ESOPHAGOGASTRODUODENOSCOPY (EGD) WITH PROPOFOL N/A 06/11/2017   Procedure: ESOPHAGOGASTRODUODENOSCOPY (EGD) WITH PROPOFOL;  Surgeon: Lucilla Lame, MD;  Location: [facility] ENDOSCOPY;  Service: Endoscopy;  Laterality: N/A;  . EUS N/A 07/04/2017   Procedure: UPPER ENDOSCOPIC ULTRASOUND (EUS) RADIAL;  Surgeon: [name], MD;  Location: [facility] ENDOSCOPY;  Service: Endoscopy;  Laterality: N/A;  . FLEXIBLE SIGMOIDOSCOPY N/A 06/11/2017   Procedure: FLEXIBLE SIGMOIDOSCOPY;  Surgeon: Lucilla Lame, MD;  Location: [facility] ENDOSCOPY;  Service: Endoscopy;  Laterality: N/A;  . HERNIA REPAIR  2013  . KYPHOPLASTY N/A 10/06/2015   Procedure: Thoracic twelve kyphoplasty;  Surgeon: Karie Chimera, MD;  Location: [facility] NEURO ORS;  Service: Neurosurgery;  Laterality: N/A;  T12 Kyphoplasty  . PROSTATE BIOPSY  11/01/08   Dr. Reece Agar  . [place]  . VASECTOMY  1975    Family History  Problem Relation Age of Onset  . Cancer Father        liver  . Cancer Sister        breast  . Cancer Brother        skin  . Cancer Sister        breast  . Heart  disease Sister        pacer  . Heart disease Sister   . Cancer Sister        breast cancer  . Heart disease Sister   . Colon cancer Neg Hx   . Prostate cancer Neg Hx     Social History:  reports that  has never smoked. he has never used smokeless tobacco. He reports that he does not drink alcohol or use drugs.  Retired Agricultural consultant; worked for [employer] for 44 years.  He was in the [employer].  He denies any exposure to radiation or toxins.  The patient is accompanied by 3 children today. He is accompanied by his 3 children today.  Allergies:  Allergies  Allergen Reactions  . Flomax [Tamsulosin Hcl] Other (See Comments)    syncope  . Ibuprofen Other (See Comments)    Oral blisters at high doses  . Rosuvastatin     REACTION: muscle stiffness  . Sulfa Antibiotics Rash  . Sulfonamide Derivatives Rash    Current Medications: Current Outpatient Medications  Medication Sig Dispense Refill  . acetaminophen (TYLENOL) 500 MG tablet Take 500-1,000 mg by mouth every 6 (six) hours as needed (for pain.).    Marland Kitchen Ascorbic Acid (VITAMIN C) 1000 MG tablet Take 1,000 mg by mouth daily.    . benzonatate (TESSALON) 100 MG capsule Take 100 mg by mouth at bedtime as needed for cough.    . Chlorpheniramine-DM (CORICIDIN HBP COUGH/COLD PO) Take by mouth 4 (four) times daily as needed.    . diphenhydrAMINE (BENADRYL) 25 mg capsule Take 25 mg by mouth at bedtime as needed (for sleep.).     Marland Kitchen docusate sodium (COLACE) 100 MG capsule Take 100 mg by mouth 2 (two) times daily as needed (for constipation).    . ferrous sulfate 325 (65 FE) MG tablet Take 1 tablet (325 mg total) by mouth daily with breakfast.    . folic acid (FOLVITE) 093 MCG tablet Take 400 mcg by mouth daily with lunch.     . glimepiride (AMARYL) 1 MG tablet Take 2 tablets (2 mg total) by mouth daily with breakfast. 180 tablet 3  . glucose blood (ONE TOUCH ULTRA TEST) test strip TO TEST BLOOD SUGAR ONCE OR TWICE DAILY AND AS DIRECTED  DX E11.9.  H/o variable blood sugars. (Patient taking differently: 1 each by Other route 2 (two) times daily. TO TEST BLOOD SUGAR ONCE OR TWICE DAILY AND AS DIRECTED DX E11.9.  H/o variable blood sugars.) 200 each 3  . levothyroxine (SYNTHROID, LEVOTHROID) 75 MCG tablet Take 1 tablet (75 mcg total) by mouth daily. (Patient taking differently: Take 75 mcg by mouth daily before breakfast. ) 90 tablet 3  . metFORMIN (GLUCOPHAGE) 500 MG tablet Take 1-2 tablets (500-1,000 mg  total) by mouth 2 (two) times daily with a meal. 500 mg every morning and 1000 mg daily with supper (may take 1 extra tablet at night if needed)    . Multiple Vitamin (MULTIVITAMIN WITH MINERALS) TABS tablet Take 1 tablet by mouth daily. Centrum Silver    . ondansetron (ZOFRAN) 8 MG tablet Take 1 tablet (8 mg total) by mouth 2 (two) times daily as needed for refractory nausea / vomiting. Start on day 3 after chemo. 30 tablet 1  . pantoprazole (PROTONIX) 40 MG tablet Take 1 tablet (40 mg total) 2 (two) times daily by mouth. 180 tablet 1  . pravastatin (PRAVACHOL) 80 MG tablet Take 1 tablet (80 mg total) by mouth at bedtime.    . pyridOXINE (VITAMIN B-6) 100 MG tablet Take 100 mg by mouth 3 (three) times a week.      No current facility-administered medications for this visit.     Review of Systems:  GENERAL:  Feels "fairly well". No fevers or sweats. Weight stable. PERFORMANCE STATUS (ECOG):  2 HEENT:  Sore throat. Dysgeusia.  Occupation related hearing loss.  Bilateral hearing aids.  No visual changes, runny nose, mouth sores or tenderness. Lungs: No shortness of breath.  No hemoptysis. Cardiac:  No chest pain, palpitations, orthopnea, or PND. GI:  No swallowing difficulties.  Little constipation.  No nausea, vomiting, diarrhea, melena or hematochezia. GU:  No urgency, frequency, dysuria, or hematuria. Musculoskeletal:  No back pain.  No joint pain.  No muscle tenderness. Extremities:  Arthritis in hands; joints stiff and "ache".  No swelling. Skin:  No rashes or skin changes. Neuro:  No headache, numbness or weakness, balance or coordination issues. Endocrine:  Diabetes, controlled.  Hypothyroid on supplementation.  No hot flashes or night sweats. Psych:  No mood changes, depression or anxiety. Pain:  No focal pain. Review of systems:  All other systems reviewed and found to be negative.  Physical Exam: Blood pressure (!) 178/82, pulse 82, temperature 98.7 F (37.1 C), temperature source Tympanic, resp. rate 20, weight 150 lb (68 kg). GENERAL:  Thin elderly gentleman sitting comfortably in the exam room in no acute distress. MENTAL STATUS:  Alert and oriented to person, place and time. HEAD:  Pearline Cables thin hair.  Normocephalic, atraumatic, face symmetric, no Cushingoid features. EYES:  Glasses.  Blue eyes.  Pupils equal round and reactive to light and accomodation.  No conjunctivitis or scleral icterus. ENT:  Oropharynx clear without lesion.  Tongue normal.  Dentures.  Mucous membranes moist.  RESPIRATORY:  Clear to auscultation without rales, wheezes or rhonchi. CARDIOVASCULAR:  Regular rate and rhythm without murmur, rub or gallop. ABDOMEN:  Soft, non-tender, with active bowel sounds, and no hepatosplenomegaly.  No masses. SKIN:  No rashes, ulcers or lesions. EXTREMITIES: Arthritic nodules in hands. No edema, no skin discoloration or tenderness.  No palpable cords. LYMPH NODES: No palpable cervical, supraclavicular, axillary or inguinal adenopathy  NEUROLOGICAL: Unremarkable. PSYCH:  Appropriate.   Appointment on 08/26/2017  Component Date Value Ref Range Status  . Magnesium 08/26/2017 1.6* 1.7 - 2.4 mg/dL Final  . Sodium 08/26/2017 134* 135 - 145 mmol/L Final  . Potassium 08/26/2017 3.7  3.5 - 5.1 mmol/L Final  . Chloride 08/26/2017 101  101 - 111 mmol/L Final  . CO2 08/26/2017 25  22 - 32 mmol/L Final  . Glucose, Bld 08/26/2017 156* 65 - 99 mg/dL Final  . BUN 08/26/2017 20  6 - 20 mg/dL Final  . Creatinine,  Ser 08/26/2017 1.08  0.61 - 1.24 mg/dL Final  . Calcium 08/26/2017 8.9  8.9 - 10.3 mg/dL Final  . Total Protein 08/26/2017 6.9  6.5 - 8.1 g/dL Final  . Albumin 08/26/2017 3.6  3.5 - 5.0 g/dL Final  . AST 08/26/2017 26  15 - 41 U/L Final  . ALT 08/26/2017 21  17 - 63 U/L Final  . Alkaline Phosphatase 08/26/2017 53  38 - 126 U/L Final  . Total Bilirubin 08/26/2017 0.6  0.3 - 1.2 mg/dL Final  . GFR calc non Af Amer 08/26/2017 >60  >60 mL/min Final  . GFR calc Af Amer 08/26/2017 >60  >60 mL/min Final   Comment: (NOTE) The eGFR has been calculated using the CKD EPI equation. This calculation has not been validated in all clinical situations. eGFR's persistently <60 mL/min signify possible Chronic Kidney Disease.   . Anion gap 08/26/2017 8  5 - 15 Final  . WBC 08/26/2017 6.9  3.8 - 10.6 K/uL Final  . RBC 08/26/2017 3.68* 4.40 - 5.90 MIL/uL Final  . Hemoglobin 08/26/2017 10.6* 13.0 - 18.0 g/dL Final  . HCT 08/26/2017 32.3* 40.0 - 52.0 % Final  . MCV 08/26/2017 87.7  80.0 - 100.0 fL Final  . MCH 08/26/2017 28.9  26.0 - 34.0 pg Final  . MCHC 08/26/2017 32.9  32.0 - 36.0 g/dL Final  . RDW 08/26/2017 15.7* 11.5 - 14.5 % Final  . Platelets 08/26/2017 197  150 - 440 K/uL Final  . Neutrophils Relative % 08/26/2017 89  % Final  . Neutro Abs 08/26/2017 6.2  1.4 - 6.5 K/uL Final  . Lymphocytes Relative 08/26/2017 4  % Final  . Lymphs Abs 08/26/2017 0.3* 1.0 - 3.6 K/uL Final  . Monocytes Relative 08/26/2017 5  % Final  . Monocytes Absolute 08/26/2017 0.3  0.2 - 1.0 K/uL Final  . Eosinophils Relative 08/26/2017 1  % Final  . Eosinophils Absolute 08/26/2017 0.1  0 - 0.7 K/uL Final  . Basophils Relative 08/26/2017 1  % Final  . Basophils Absolute 08/26/2017 0.1  0 - 0.1 K/uL Final    Assessment:  Garin E Goshert is a 83 y.o. male with clinical stage II esophageal cancer (uT2N0).  He presented with an upper GI bleed.  EGD on 06/11/2017 revealed one cratered esophageal ulcer and stigmata of recent  esophogeal bleeding at the GE junction.  Biopsy revealed poorly differentiated adenocarcinoma, signet ring type.  There was one non-bleeding cratered gastric ulcer with no stigmata of bleeding in the gastric antrum.  Duodenum was normal.  Colonoscopy on 03/30/2015 revealed a 20 mm tubulovillous adenoma of the rectum.  Flexible sigmoidoscopy on 06/01/2017 revealed a sessile polyp in the rectum and grade II non-bleeding internal hemorrhoids.  PET scan  on 06/27/2017 that revealed no appreciable abnormal esophageal hypermetabolic activity to correspond with the reported tumor. There was no adenopathy or other findings of metastatic disease. There was question that the original tumor is either low-grade or very small.  Upper endoscopic ultrasound (EUS) on 07/04/2017 revealed a 1-2cm ulcerated mass at the GE junction that was partially circumferential and nonobstructive. The mass is 1 cm in thickness and occupies 3/4 of the  lumen circumference at the GE junction. The mass clearly passes into, but not through the muscularis propria layer of the esophageal wall. There was no paraesophageal adenopathy. Clinical stage was uT2N0 GE junction adenocarcinoma.  He is s/p week #4 Carboplatin and Taxol with radiation (07/29/2017 - 08/19/2017).  Symptomatically, he is doing "ok" following his fourth week  of concurrent chemotherapy and radiation.  He notes sore throat associated with radiation.  He notes increasing dysgeusia.  He denies any melena or hematochezia.  Exam is unremarkable. Labs are stable. Mg low at 1.6.   Plan: 1.  Labs today:  CBC with diff, CMP, Mg.  2.  Week #5 carboplatin and Taxol. 3.  Magnesium low at 1.6. Will replace with 1 gram of intravenous magnesium. 4.  Discuss nutrition. Patient encouraged to increase protein and calorie intake to the extent possible. Diet will need to consist of soft items and supplemental shakes given the patient's sore throat.  5.  Continue daily radiation per Dr.  Baruch Gouty.  6.  RTC in 1 week for MD assessment, labs (CBC with diff, CMP, Mg), and week #6 of carboplatin + Taxol.     Honor Loh, NP  08/26/2017, 10:40 AM   I saw and evaluated the patient, participating in the key portions of the service and reviewing pertinent diagnostic studies and records.  I reviewed the nurse practitioner's note and agree with the findings and the plan.  The assessment and plan were discussed with the patient.  A few questions were asked by the patient and his family and answered.   Nolon Stalls, MD 08/26/2017, 10:40 AM

## 2017-08-27 ENCOUNTER — Ambulatory Visit
Admission: RE | Admit: 2017-08-27 | Discharge: 2017-08-27 | Disposition: A | Discharge status: Home / Self Care | Payer: Medicare Other | Source: Ambulatory Visit | Attending: Radiation Oncology | Admitting: Radiation Oncology

## 2017-08-27 DIAGNOSIS — C155 Malignant neoplasm of lower third of esophagus: Secondary | ICD-10-CM | POA: Diagnosis not present

## 2017-08-28 ENCOUNTER — Ambulatory Visit
Admission: RE | Admit: 2017-08-28 | Discharge: 2017-08-28 | Disposition: A | Discharge status: Home / Self Care | Payer: Medicare Other | Source: Ambulatory Visit | Attending: Radiation Oncology | Admitting: Radiation Oncology

## 2017-08-28 DIAGNOSIS — C155 Malignant neoplasm of lower third of esophagus: Secondary | ICD-10-CM | POA: Diagnosis not present

## 2017-08-29 ENCOUNTER — Ambulatory Visit
Admission: RE | Admit: 2017-08-29 | Discharge: 2017-08-29 | Disposition: A | Discharge status: Home / Self Care | Payer: Medicare Other | Source: Ambulatory Visit | Attending: Radiation Oncology | Admitting: Radiation Oncology

## 2017-08-29 ENCOUNTER — Other Ambulatory Visit: Payer: Self-pay | Admitting: *Deleted

## 2017-08-29 DIAGNOSIS — C155 Malignant neoplasm of lower third of esophagus: Secondary | ICD-10-CM | POA: Diagnosis not present

## 2017-08-30 ENCOUNTER — Ambulatory Visit
Admission: RE | Admit: 2017-08-30 | Discharge: 2017-08-30 | Disposition: A | Discharge status: Home / Self Care | Payer: Medicare Other | Source: Ambulatory Visit | Attending: Radiation Oncology | Admitting: Radiation Oncology

## 2017-08-30 DIAGNOSIS — C155 Malignant neoplasm of lower third of esophagus: Secondary | ICD-10-CM | POA: Diagnosis not present

## 2017-09-02 ENCOUNTER — Ambulatory Visit: Payer: Medicare Other

## 2017-09-02 ENCOUNTER — Inpatient Hospital Stay: Payer: Medicare Other | Admitting: Hematology and Oncology

## 2017-09-02 ENCOUNTER — Inpatient Hospital Stay: Payer: Medicare Other

## 2017-09-03 ENCOUNTER — Inpatient Hospital Stay: Payer: Medicare Other

## 2017-09-03 ENCOUNTER — Other Ambulatory Visit: Payer: Self-pay

## 2017-09-03 ENCOUNTER — Ambulatory Visit: Payer: Medicare Other | Admitting: Hematology and Oncology

## 2017-09-03 ENCOUNTER — Encounter: Payer: Self-pay | Admitting: Hematology and Oncology

## 2017-09-03 ENCOUNTER — Other Ambulatory Visit: Payer: Medicare Other

## 2017-09-03 ENCOUNTER — Inpatient Hospital Stay (HOSPITAL_BASED_OUTPATIENT_CLINIC_OR_DEPARTMENT_OTHER): Payer: Medicare Other | Admitting: Hematology and Oncology

## 2017-09-03 ENCOUNTER — Ambulatory Visit
Admission: RE | Admit: 2017-09-03 | Discharge: 2017-09-03 | Disposition: A | Discharge status: Home / Self Care | Payer: Medicare Other | Source: Ambulatory Visit | Attending: Radiation Oncology | Admitting: Radiation Oncology

## 2017-09-03 ENCOUNTER — Ambulatory Visit: Payer: Medicare Other

## 2017-09-03 VITALS — BP 165/86 | HR 80 | Temp 98.6°F | Resp 18 | Wt 147.1 lb

## 2017-09-03 DIAGNOSIS — E039 Hypothyroidism, unspecified: Secondary | ICD-10-CM

## 2017-09-03 DIAGNOSIS — Z86010 Personal history of colonic polyps: Secondary | ICD-10-CM

## 2017-09-03 DIAGNOSIS — E119 Type 2 diabetes mellitus without complications: Secondary | ICD-10-CM

## 2017-09-03 DIAGNOSIS — C160 Malignant neoplasm of cardia: Secondary | ICD-10-CM | POA: Diagnosis not present

## 2017-09-03 DIAGNOSIS — R634 Abnormal weight loss: Secondary | ICD-10-CM

## 2017-09-03 DIAGNOSIS — E785 Hyperlipidemia, unspecified: Secondary | ICD-10-CM

## 2017-09-03 DIAGNOSIS — I10 Essential (primary) hypertension: Secondary | ICD-10-CM

## 2017-09-03 DIAGNOSIS — C159 Malignant neoplasm of esophagus, unspecified: Secondary | ICD-10-CM

## 2017-09-03 DIAGNOSIS — R432 Parageusia: Secondary | ICD-10-CM | POA: Diagnosis not present

## 2017-09-03 DIAGNOSIS — K648 Other hemorrhoids: Secondary | ICD-10-CM

## 2017-09-03 DIAGNOSIS — M1990 Unspecified osteoarthritis, unspecified site: Secondary | ICD-10-CM

## 2017-09-03 DIAGNOSIS — Z5111 Encounter for antineoplastic chemotherapy: Secondary | ICD-10-CM

## 2017-09-03 DIAGNOSIS — Z7189 Other specified counseling: Secondary | ICD-10-CM

## 2017-09-03 DIAGNOSIS — Y842 Radiological procedure and radiotherapy as the cause of abnormal reaction of the patient, or of later complication, without mention of misadventure at the time of the procedure: Secondary | ICD-10-CM

## 2017-09-03 DIAGNOSIS — R070 Pain in throat: Secondary | ICD-10-CM | POA: Diagnosis not present

## 2017-09-03 DIAGNOSIS — Z808 Family history of malignant neoplasm of other organs or systems: Secondary | ICD-10-CM

## 2017-09-03 DIAGNOSIS — Z803 Family history of malignant neoplasm of breast: Secondary | ICD-10-CM

## 2017-09-03 DIAGNOSIS — Z882 Allergy status to sulfonamides status: Secondary | ICD-10-CM

## 2017-09-03 DIAGNOSIS — C155 Malignant neoplasm of lower third of esophagus: Secondary | ICD-10-CM | POA: Diagnosis not present

## 2017-09-03 DIAGNOSIS — Z79899 Other long term (current) drug therapy: Secondary | ICD-10-CM

## 2017-09-03 DIAGNOSIS — Z7984 Long term (current) use of oral hypoglycemic drugs: Secondary | ICD-10-CM

## 2017-09-03 LAB — COMPREHENSIVE METABOLIC PANEL
ALT: 22 U/L (ref 17–63)
AST: 29 U/L (ref 15–41)
Albumin: 3.6 g/dL (ref 3.5–5.0)
Alkaline Phosphatase: 47 U/L (ref 38–126)
Anion gap: 8 (ref 5–15)
BUN: 18 mg/dL (ref 6–20)
CO2: 24 mmol/L (ref 22–32)
Calcium: 8.8 mg/dL — ABNORMAL LOW (ref 8.9–10.3)
Chloride: 104 mmol/L (ref 101–111)
Creatinine, Ser: 1.21 mg/dL (ref 0.61–1.24)
GFR calc Af Amer: >60 mL/min (ref >60)
GFR calc non Af Amer: 53 mL/min — ABNORMAL LOW (ref >60)
Glucose, Bld: 129 mg/dL — ABNORMAL HIGH (ref 65–99)
Potassium: 3.8 mmol/L (ref 3.5–5.1)
Sodium: 136 mmol/L (ref 135–145)
Total Bilirubin: 0.7 mg/dL (ref 0.3–1.2)
Total Protein: 6.6 g/dL (ref 6.5–8.1)

## 2017-09-03 LAB — CBC WITH DIFFERENTIAL/PLATELET
Basophils Absolute: 0 10*3/uL (ref 0–0.1)
Basophils Relative: 1 %
Eosinophils Absolute: 0 10*3/uL (ref 0–0.7)
Eosinophils Relative: 1 %
HCT: 30.7 % — ABNORMAL LOW (ref 40.0–52.0)
Hemoglobin: 10.3 g/dL — ABNORMAL LOW (ref 13.0–18.0)
Lymphocytes Relative: 5 %
Lymphs Abs: 0.3 10*3/uL — ABNORMAL LOW (ref 1.0–3.6)
MCH: 29.4 pg (ref 26.0–34.0)
MCHC: 33.7 g/dL (ref 32.0–36.0)
MCV: 87.5 fL (ref 80.0–100.0)
Monocytes Absolute: 0.3 10*3/uL (ref 0.2–1.0)
Monocytes Relative: 7 %
Neutro Abs: 4.2 10*3/uL (ref 1.4–6.5)
Neutrophils Relative %: 86 %
Platelets: 142 10*3/uL — ABNORMAL LOW (ref 150–440)
RBC: 3.51 MIL/uL — ABNORMAL LOW (ref 4.40–5.90)
RDW: 16.8 % — ABNORMAL HIGH (ref 11.5–14.5)
WBC: 4.9 10*3/uL (ref 3.8–10.6)

## 2017-09-03 LAB — MAGNESIUM: Magnesium: 1.6 mg/dL — ABNORMAL LOW (ref 1.7–2.4)

## 2017-09-03 MED ORDER — SODIUM CHLORIDE 0.9 % IV SOLN
50.0000 mg/m2 | Freq: Once | INTRAVENOUS | Status: AC
  Administered 2017-09-03: 90 mg via INTRAVENOUS
  Filled 2017-09-03: qty 15

## 2017-09-03 MED ORDER — SODIUM CHLORIDE 0.9 % IV SOLN
140.0000 mg | Freq: Once | INTRAVENOUS | Status: AC
  Administered 2017-09-03: 140 mg via INTRAVENOUS
  Filled 2017-09-03: qty 14

## 2017-09-03 MED ORDER — PALONOSETRON HCL INJECTION 0.25 MG/5ML
0.2500 mg | Freq: Once | INTRAVENOUS | Status: AC
  Administered 2017-09-03: 0.25 mg via INTRAVENOUS
  Filled 2017-09-03: qty 5

## 2017-09-03 MED ORDER — SODIUM CHLORIDE 0.9 % IV SOLN
Freq: Once | INTRAVENOUS | Status: AC
  Administered 2017-09-03: 11:00:00 via INTRAVENOUS
  Filled 2017-09-03: qty 1000

## 2017-09-03 MED ORDER — DIPHENHYDRAMINE HCL 50 MG/ML IJ SOLN
50.0000 mg | Freq: Once | INTRAMUSCULAR | Status: AC
  Administered 2017-09-03: 50 mg via INTRAVENOUS
  Filled 2017-09-03: qty 1

## 2017-09-03 MED ORDER — SODIUM CHLORIDE 0.9 % IV SOLN
20.0000 mg | Freq: Once | INTRAVENOUS | Status: AC
  Administered 2017-09-03: 20 mg via INTRAVENOUS
  Filled 2017-09-03: qty 2

## 2017-09-03 MED ORDER — SODIUM CHLORIDE 0.9 % IV SOLN
1.0000 g | Freq: Once | INTRAVENOUS | Status: AC
  Administered 2017-09-03: 1 g via INTRAVENOUS
  Filled 2017-09-03: qty 2

## 2017-09-03 MED ORDER — FAMOTIDINE IN NACL 20-0.9 MG/50ML-% IV SOLN
20.0000 mg | Freq: Once | INTRAVENOUS | Status: AC
  Administered 2017-09-03: 20 mg via INTRAVENOUS
  Filled 2017-09-03: qty 50

## 2017-09-03 NOTE — Progress Notes (Signed)
SCr increased to 1.21. Changed carboplatin dose back to 140mg  due to more than 10% difference from previous dose of 150mg  and calculated dose of 134mg .

## 2017-09-03 NOTE — Progress Notes (Signed)
[facility] day:  09/03/2017  Chief Complaint: Aaron Becker is a 83 y.o. male with esophageal carcinoma who is seen for assessment prior to week #6 carboplatin and Taxol with radiation.  HPI:  The patient was last seen in the medical  oncology clinic on 08/26/2017.  At that time, he was doing well.  He noted a sore throat associated with radiation.  He had increasing dysgeusia.  Exam was unremarkable. Labs were stable.  Magnesium was low at 1.6.  He received week #5 chemotherapy and 1 gm of IV magnesium.  Radiation continued.  During the interim, patient notes that he has felt "good". Patient denies any acute physical complaints. Patient is scheduled to complete his radiation treatments next week. His previously reported sore throat has improved. He states, "water don't even taste right any more". His weight is down 3 pounds since his last visit. Patient with bruising noted to the dorsal aspects of his hands. He states, "I was trying to start that old generator and this happened".   Patient denies B symptoms. He has not experienced any interval infections.    Past Medical History:  Diagnosis Date  . Anemia   . Arthritis    hands  . Bradycardia   . Colon polyp   . Diabetes mellitus, type II ([place]) 1986   oral meds  . Elevated PSA 2011   per Dr. Jacqlyn Larsen with Uro no problems last 5 or 6 yrs  . Esophageal cancer ([place]) dx sept 18 2018  . HOH (hard of hearing)    bilateral AIDES  . Hyperlipidemia   . Hypertension 02/02   off bp meds since hospitalization 3 weeks ago  . Hypothyroidism 1980's  . Palpitations   . Right rib fracture 2015  . SVT (supraventricular tachycardia) ([place])    a. reported SVT during admission at [facility] 06/2014  . Syncope and collapse    none recent last few weeks  . Wears dentures    upper and lower    Past Surgical History:  Procedure Laterality Date  . [place], 09/2015   COMPRESSED FRACTURE SPINE  .  carotid ultrasound  02/02   [facility]  . CATARACT EXTRACTION W/PHACO Right 03/19/2016   Procedure: CATARACT EXTRACTION PHACO AND INTRAOCULAR LENS PLACEMENT (IOC);  Surgeon: Ronnell Freshwater, MD;  Location: [place];  Service: Ophthalmology;  Laterality: Right;  DIABETIC-oral med RIGHT  . CATARACT EXTRACTION W/PHACO Left 04/23/2016   Procedure: CATARACT EXTRACTION PHACO AND INTRAOCULAR LENS PLACEMENT (IOC);  Surgeon: Ronnell Freshwater, MD;  Location: [place];  Service: Ophthalmology;  Laterality: Left;  LEFT DIABETIC - oral meds  . CHOLECYSTECTOMY  1967  . COLONOSCOPY W/ POLYPECTOMY  2012   Villous adenoma from the rectum without high-grade dysplasia, 10 mm  . COLONOSCOPY WITH PROPOFOL N/A 03/30/2015   Procedure: COLONOSCOPY WITH PROPOFOL;  Surgeon: Robert Bellow, MD;  Location: [facility] ENDOSCOPY;  Service: Endoscopy;  Laterality: N/A;  . CYST EXCISION     thumb  . CYSTECTOMY  06/14/04   mucous cyst excision with left thumb, IP joint debridement  . ESOPHAGOGASTRODUODENOSCOPY (EGD) WITH PROPOFOL N/A 06/11/2017   Procedure: ESOPHAGOGASTRODUODENOSCOPY (EGD) WITH PROPOFOL;  Surgeon: Lucilla Lame, MD;  Location: [facility] ENDOSCOPY;  Service: Endoscopy;  Laterality: N/A;  . EUS N/A 07/04/2017   Procedure: UPPER ENDOSCOPIC ULTRASOUND (EUS) RADIAL;  Surgeon: [name], MD;  Location: [facility] ENDOSCOPY;  Service: Endoscopy;  Laterality: N/A;  .  FLEXIBLE SIGMOIDOSCOPY N/A 06/11/2017   Procedure: FLEXIBLE SIGMOIDOSCOPY;  Surgeon: Lucilla Lame, MD;  Location: [facility] ENDOSCOPY;  Service: Endoscopy;  Laterality: N/A;  . HERNIA REPAIR  2013  . KYPHOPLASTY N/A 10/06/2015   Procedure: Thoracic twelve kyphoplasty;  Surgeon: Karie Chimera, MD;  Location: [place] NEURO ORS;  Service: Neurosurgery;  Laterality: N/A;  T12 Kyphoplasty  . PROSTATE BIOPSY  11/01/08   Dr. Reece Agar  . [place]  . VASECTOMY  1975    Family History  Problem Relation Age of Onset  . Cancer  Father        liver  . Cancer Sister        breast  . Cancer Brother        skin  . Cancer Sister        breast  . Heart disease Sister        pacer  . Heart disease Sister   . Cancer Sister        breast cancer  . Heart disease Sister   . Colon cancer Neg Hx   . Prostate cancer Neg Hx     Social History:  reports that  has never smoked. he has never used smokeless tobacco. He reports that he does not drink alcohol or use drugs.  Retired Agricultural consultant; worked for [employer] for 44 years.  He was in the [employer].  He denies any exposure to radiation or toxins.  The patient is accompanied by 2 children today.   Allergies:  Allergies  Allergen Reactions  . Flomax [Tamsulosin Hcl] Other (See Comments)    syncope  . Ibuprofen Other (See Comments)    Oral blisters at high doses  . Rosuvastatin     REACTION: muscle stiffness  . Sulfa Antibiotics Rash  . Sulfonamide Derivatives Rash    Current Medications: Current Outpatient Medications  Medication Sig Dispense Refill  . Ascorbic Acid (VITAMIN C) 1000 MG tablet Take 1,000 mg by mouth daily.    . benzonatate (TESSALON) 100 MG capsule Take 100 mg by mouth at bedtime as needed for cough.    . Chlorpheniramine-DM (CORICIDIN HBP COUGH/COLD PO) Take by mouth 4 (four) times daily as needed.    . diphenhydrAMINE (BENADRYL) 25 mg capsule Take 25 mg by mouth at bedtime as needed (for sleep.).     Marland Kitchen glimepiride (AMARYL) 1 MG tablet Take 2 tablets (2 mg total) by mouth daily with breakfast. 180 tablet 3  . glucose blood (ONE TOUCH ULTRA TEST) test strip TO TEST BLOOD SUGAR ONCE OR TWICE DAILY AND AS DIRECTED DX E11.9.  H/o variable blood sugars. (Patient taking differently: 1 each by Other route 2 (two) times daily. TO TEST BLOOD SUGAR ONCE OR TWICE DAILY AND AS DIRECTED DX E11.9.  H/o variable blood sugars.) 200 each 3  . levothyroxine (SYNTHROID, LEVOTHROID) 75 MCG tablet Take 1 tablet (75 mcg total) by mouth daily. (Patient taking  differently: Take 75 mcg by mouth daily before breakfast. ) 90 tablet 3  . metFORMIN (GLUCOPHAGE) 500 MG tablet Take 1-2 tablets (500-1,000 mg total) by mouth 2 (two) times daily with a meal. 500 mg every morning and 1000 mg daily with supper (may take 1 extra tablet at night if needed)    . Multiple Vitamin (MULTIVITAMIN WITH MINERALS) TABS tablet Take 1 tablet by mouth daily. Centrum Silver    . pantoprazole (PROTONIX) 40 MG tablet Take 1 tablet (40 mg total) 2 (two) times daily by mouth. 180 tablet  1  . pravastatin (PRAVACHOL) 80 MG tablet Take 1 tablet (80 mg total) by mouth at bedtime.    Marland Kitchen acetaminophen (TYLENOL) 500 MG tablet Take 500-1,000 mg by mouth every 6 (six) hours as needed (for pain.).    Marland Kitchen docusate sodium (COLACE) 100 MG capsule Take 100 mg by mouth 2 (two) times daily as needed (for constipation).    . ferrous sulfate 325 (65 FE) MG tablet Take 1 tablet (325 mg total) by mouth daily with breakfast. (Patient not taking: Reported on 09/03/2017)    . folic acid (FOLVITE) 103 MCG tablet Take 400 mcg by mouth daily with lunch.     . ondansetron (ZOFRAN) 8 MG tablet Take 1 tablet (8 mg total) by mouth 2 (two) times daily as needed for refractory nausea / vomiting. Start on day 3 after chemo. (Patient not taking: Reported on 09/03/2017) 30 tablet 1  . pyridOXINE (VITAMIN B-6) 100 MG tablet Take 100 mg by mouth 3 (three) times a week.      No current facility-administered medications for this visit.     Review of Systems:  GENERAL:  Feels "good". No fevers or sweats. Weight down 3 pounds. Marland Kitchen PERFORMANCE STATUS (ECOG):  2 HEENT:  Sore throat, improved. Dysgeusia.  Occupation related hearing loss.  Bilateral hearing aids.  No visual changes, runny nose, mouth sores or tenderness. Lungs: No shortness of breath.  No hemoptysis. Cardiac:  No chest pain, palpitations, orthopnea, or PND. GI:  No swallowing difficulties.  Little constipation.  No nausea, vomiting, diarrhea, melena or  hematochezia. GU:  No urgency, frequency, dysuria, or hematuria. Musculoskeletal:  No back pain.  No joint pain.  No muscle tenderness. Extremities:  Arthritis in hands; joints stiff and "ache". No swelling. Skin:  No rashes or skin changes. Neuro:  No headache, numbness or weakness, balance or coordination issues. Endocrine:  Diabetes, controlled.  Hypothyroid on supplementation.  No hot flashes or night sweats. Psych:  No mood changes, depression or anxiety. Pain:  No focal pain. Review of systems:  All other systems reviewed and found to be negative.  Physical Exam: Blood pressure (!) 165/86, pulse 80, temperature 98.6 F (37 C), temperature source Tympanic, resp. rate 18, weight 147 lb 1.6 oz (66.7 kg). GENERAL:  Thin elderly gentleman sitting comfortably in the exam room in no acute distress. MENTAL STATUS:  Alert and oriented to person, place and time. HEAD:  Pearline Cables thin hair.  Normocephalic, atraumatic, face symmetric, no Cushingoid features. EYES:  Glasses.  Blue eyes.  Pupils equal round and reactive to light and accomodation.  No conjunctivitis or scleral icterus. ENT:  Oropharynx clear without lesion.  Tongue normal.  Dentures.  Mucous membranes moist.  RESPIRATORY:  Clear to auscultation without rales, wheezes or rhonchi. CARDIOVASCULAR:  Regular rate and rhythm without murmur, rub or gallop. ABDOMEN:  Soft, non-tender, with active bowel sounds, and no hepatosplenomegaly.  No masses. SKIN:  Bruising on hands.  No rashes, ulcers or lesions. EXTREMITIES: Arthritic nodules in hands. No edema, no skin discoloration or tenderness.  No palpable cords. LYMPH NODES: No palpable cervical, supraclavicular, axillary or inguinal adenopathy  NEUROLOGICAL: Unremarkable. PSYCH:  Appropriate.   Appointment on 09/03/2017  Component Date Value Ref Range Status  . Magnesium 09/03/2017 1.6* 1.7 - 2.4 mg/dL Final  . Sodium 09/03/2017 136  135 - 145 mmol/L Final  . Potassium 09/03/2017 3.8  3.5  - 5.1 mmol/L Final  . Chloride 09/03/2017 104  101 - 111 mmol/L Final  . CO2  09/03/2017 24  22 - 32 mmol/L Final  . Glucose, Bld 09/03/2017 129* 65 - 99 mg/dL Final  . BUN 09/03/2017 18  6 - 20 mg/dL Final  . Creatinine, Ser 09/03/2017 1.21  0.61 - 1.24 mg/dL Final  . Calcium 09/03/2017 8.8* 8.9 - 10.3 mg/dL Final  . Total Protein 09/03/2017 6.6  6.5 - 8.1 g/dL Final  . Albumin 09/03/2017 3.6  3.5 - 5.0 g/dL Final  . AST 09/03/2017 29  15 - 41 U/L Final  . ALT 09/03/2017 22  17 - 63 U/L Final  . Alkaline Phosphatase 09/03/2017 47  38 - 126 U/L Final  . Total Bilirubin 09/03/2017 0.7  0.3 - 1.2 mg/dL Final  . GFR calc non Af Amer 09/03/2017 53* >60 mL/min Final  . GFR calc Af Amer 09/03/2017 >60  >60 mL/min Final   Comment: (NOTE) The eGFR has been calculated using the CKD EPI equation. This calculation has not been validated in all clinical situations. eGFR's persistently <60 mL/min signify possible Chronic Kidney Disease.   . Anion gap 09/03/2017 8  5 - 15 Final  . WBC 09/03/2017 4.9  3.8 - 10.6 K/uL Final  . RBC 09/03/2017 3.51* 4.40 - 5.90 MIL/uL Final  . Hemoglobin 09/03/2017 10.3* 13.0 - 18.0 g/dL Final  . HCT 09/03/2017 30.7* 40.0 - 52.0 % Final  . MCV 09/03/2017 87.5  80.0 - 100.0 fL Final  . MCH 09/03/2017 29.4  26.0 - 34.0 pg Final  . MCHC 09/03/2017 33.7  32.0 - 36.0 g/dL Final  . RDW 09/03/2017 16.8* 11.5 - 14.5 % Final  . Platelets 09/03/2017 142* 150 - 440 K/uL Final  . Neutrophils Relative % 09/03/2017 86  % Final  . Neutro Abs 09/03/2017 4.2  1.4 - 6.5 K/uL Final  . Lymphocytes Relative 09/03/2017 5  % Final  . Lymphs Abs 09/03/2017 0.3* 1.0 - 3.6 K/uL Final  . Monocytes Relative 09/03/2017 7  % Final  . Monocytes Absolute 09/03/2017 0.3  0.2 - 1.0 K/uL Final  . Eosinophils Relative 09/03/2017 1  % Final  . Eosinophils Absolute 09/03/2017 0.0  0 - 0.7 K/uL Final  . Basophils Relative 09/03/2017 1  % Final  . Basophils Absolute 09/03/2017 0.0  0 - 0.1 K/uL Final     Assessment:  Pete E Catena is a 83 y.o. male with clinical stage II esophageal cancer (uT2N0).  He presented with an upper GI bleed.  EGD on 06/11/2017 revealed one cratered esophageal ulcer and stigmata of recent esophogeal bleeding at the GE junction.  Biopsy revealed poorly differentiated adenocarcinoma, signet ring type.  There was one non-bleeding cratered gastric ulcer with no stigmata of bleeding in the gastric antrum.  Duodenum was normal.  Colonoscopy on 03/30/2015 revealed a 20 mm tubulovillous adenoma of the rectum.  Flexible sigmoidoscopy on 06/01/2017 revealed a sessile polyp in the rectum and grade II non-bleeding internal hemorrhoids.  PET scan  on 06/27/2017 that revealed no appreciable abnormal esophageal hypermetabolic activity to correspond with the reported tumor. There was no adenopathy or other findings of metastatic disease. There was question that the original tumor is either low-grade or very small.  Upper endoscopic ultrasound (EUS) on 07/04/2017 revealed a 1-2cm ulcerated mass at the GE junction that was partially circumferential and nonobstructive. The mass is 1 cm in thickness and occupies 3/4 of the  lumen circumference at the GE junction. The mass clearly passes into, but not through the muscularis propria layer of the esophageal wall. There was  no paraesophageal adenopathy. Clinical stage was uT2N0 GE junction adenocarcinoma.  He is s/p week #5 Carboplatin and Taxol with radiation (07/29/2017 - 08/26/2017).  Symptomatically, he is doing "ok" following his fifth week of concurrent chemotherapy and radiation.  He notes sore throat associated with radiation; improved.  He notes increasing dysgeusia.  He denies any melena or hematochezia.  Exam is unremarkable. Labs are stable. Mg is low at 1.6.   Plan: 1.  Labs today:  CBC with diff, CMP, Mg.  2.  Week #6 carboplatin and Taxol.  Chemotherapy completes today. 3.  Magnesium low at 1.6. Will replace with 1 gram of  intravenous magnesium. 4.  Discuss weight loss. Weight is down 3 pounds. Patient encouraged to increase protein and calorie intake to the extent possible. Diet will need to consist of soft items and supplemental shakes given the patient's sore throat.  5.  Continue daily radiation per Dr. Baruch Gouty. Scheduled to complete therapy next week, however end date may be extended due to weather.  6.  RTC in 1 week for NP assessment and labs (CBC with diff, BMP, Mg).     Honor Loh, NP  09/03/2017, 10:56 AM   I saw and evaluated the patient, participating in the key portions of the service and reviewing pertinent diagnostic studies and records.  I reviewed the nurse practitioner's note and agree with the findings and the plan.  The assessment and plan were discussed with the patient.  A few questions were asked by the patient and his family and answered.   Nolon Stalls, MD 09/03/2017, 10:56 AM

## 2017-09-03 NOTE — Progress Notes (Signed)
Here for follow up.stated overall feeling / doing well.

## 2017-09-04 ENCOUNTER — Ambulatory Visit
Admission: RE | Admit: 2017-09-04 | Discharge: 2017-09-04 | Disposition: A | Discharge status: Home / Self Care | Payer: Medicare Other | Source: Ambulatory Visit | Attending: Radiation Oncology | Admitting: Radiation Oncology

## 2017-09-04 DIAGNOSIS — C155 Malignant neoplasm of lower third of esophagus: Secondary | ICD-10-CM | POA: Diagnosis not present

## 2017-09-05 ENCOUNTER — Ambulatory Visit
Admission: RE | Admit: 2017-09-05 | Discharge: 2017-09-05 | Disposition: A | Discharge status: Home / Self Care | Payer: Medicare Other | Source: Ambulatory Visit | Attending: Radiation Oncology | Admitting: Radiation Oncology

## 2017-09-05 DIAGNOSIS — C155 Malignant neoplasm of lower third of esophagus: Secondary | ICD-10-CM | POA: Diagnosis not present

## 2017-09-06 ENCOUNTER — Ambulatory Visit
Admission: RE | Admit: 2017-09-06 | Discharge: 2017-09-06 | Disposition: A | Discharge status: Home / Self Care | Payer: Medicare Other | Source: Ambulatory Visit | Attending: Radiation Oncology | Admitting: Radiation Oncology

## 2017-09-06 DIAGNOSIS — C155 Malignant neoplasm of lower third of esophagus: Secondary | ICD-10-CM | POA: Diagnosis not present

## 2017-09-08 ENCOUNTER — Encounter: Payer: Self-pay | Admitting: Hematology and Oncology

## 2017-09-08 ENCOUNTER — Ambulatory Visit: Payer: Medicare Other

## 2017-09-09 ENCOUNTER — Ambulatory Visit
Admission: RE | Admit: 2017-09-09 | Discharge: 2017-09-09 | Disposition: A | Discharge status: Home / Self Care | Payer: Medicare Other | Source: Ambulatory Visit | Attending: Radiation Oncology | Admitting: Radiation Oncology

## 2017-09-09 DIAGNOSIS — C155 Malignant neoplasm of lower third of esophagus: Secondary | ICD-10-CM | POA: Diagnosis not present

## 2017-09-10 ENCOUNTER — Ambulatory Visit: Payer: Medicare Other

## 2017-09-10 ENCOUNTER — Other Ambulatory Visit: Payer: Medicare Other

## 2017-09-10 ENCOUNTER — Ambulatory Visit
Admission: RE | Admit: 2017-09-10 | Discharge: 2017-09-10 | Disposition: A | Discharge status: Home / Self Care | Payer: Medicare Other | Source: Ambulatory Visit | Attending: Radiation Oncology | Admitting: Radiation Oncology

## 2017-09-10 ENCOUNTER — Inpatient Hospital Stay: Payer: Medicare Other

## 2017-09-10 ENCOUNTER — Ambulatory Visit: Payer: Medicare Other | Admitting: Urgent Care

## 2017-09-10 ENCOUNTER — Inpatient Hospital Stay (HOSPITAL_BASED_OUTPATIENT_CLINIC_OR_DEPARTMENT_OTHER): Payer: Medicare Other | Admitting: Urgent Care

## 2017-09-10 VITALS — BP 168/84 | HR 76 | Temp 98.9°F | Resp 18 | Wt 146.1 lb

## 2017-09-10 DIAGNOSIS — Y842 Radiological procedure and radiotherapy as the cause of abnormal reaction of the patient, or of later complication, without mention of misadventure at the time of the procedure: Secondary | ICD-10-CM

## 2017-09-10 DIAGNOSIS — Z86010 Personal history of colonic polyps: Secondary | ICD-10-CM

## 2017-09-10 DIAGNOSIS — Z79899 Other long term (current) drug therapy: Secondary | ICD-10-CM

## 2017-09-10 DIAGNOSIS — I10 Essential (primary) hypertension: Secondary | ICD-10-CM | POA: Diagnosis not present

## 2017-09-10 DIAGNOSIS — R634 Abnormal weight loss: Secondary | ICD-10-CM | POA: Diagnosis not present

## 2017-09-10 DIAGNOSIS — C159 Malignant neoplasm of esophagus, unspecified: Secondary | ICD-10-CM

## 2017-09-10 DIAGNOSIS — M1990 Unspecified osteoarthritis, unspecified site: Secondary | ICD-10-CM | POA: Diagnosis not present

## 2017-09-10 DIAGNOSIS — E785 Hyperlipidemia, unspecified: Secondary | ICD-10-CM

## 2017-09-10 DIAGNOSIS — R432 Parageusia: Secondary | ICD-10-CM

## 2017-09-10 DIAGNOSIS — C160 Malignant neoplasm of cardia: Secondary | ICD-10-CM

## 2017-09-10 DIAGNOSIS — R1310 Dysphagia, unspecified: Secondary | ICD-10-CM

## 2017-09-10 DIAGNOSIS — K208 Other esophagitis without bleeding: Secondary | ICD-10-CM

## 2017-09-10 DIAGNOSIS — K648 Other hemorrhoids: Secondary | ICD-10-CM

## 2017-09-10 DIAGNOSIS — R070 Pain in throat: Secondary | ICD-10-CM | POA: Diagnosis not present

## 2017-09-10 DIAGNOSIS — C155 Malignant neoplasm of lower third of esophagus: Secondary | ICD-10-CM | POA: Diagnosis not present

## 2017-09-10 DIAGNOSIS — Z808 Family history of malignant neoplasm of other organs or systems: Secondary | ICD-10-CM

## 2017-09-10 DIAGNOSIS — Z882 Allergy status to sulfonamides status: Secondary | ICD-10-CM

## 2017-09-10 DIAGNOSIS — Z803 Family history of malignant neoplasm of breast: Secondary | ICD-10-CM

## 2017-09-10 DIAGNOSIS — Z7984 Long term (current) use of oral hypoglycemic drugs: Secondary | ICD-10-CM

## 2017-09-10 DIAGNOSIS — E119 Type 2 diabetes mellitus without complications: Secondary | ICD-10-CM

## 2017-09-10 DIAGNOSIS — E039 Hypothyroidism, unspecified: Secondary | ICD-10-CM

## 2017-09-10 LAB — CBC WITH DIFFERENTIAL/PLATELET
Basophils Absolute: 0 10*3/uL (ref 0–0.1)
Basophils Relative: 1 %
Eosinophils Absolute: 0 10*3/uL (ref 0–0.7)
Eosinophils Relative: 1 %
HCT: 31.2 % — ABNORMAL LOW (ref 40.0–52.0)
Hemoglobin: 10.3 g/dL — ABNORMAL LOW (ref 13.0–18.0)
Lymphocytes Relative: 5 %
Lymphs Abs: 0.2 10*3/uL — ABNORMAL LOW (ref 1.0–3.6)
MCH: 29.3 pg (ref 26.0–34.0)
MCHC: 33.1 g/dL (ref 32.0–36.0)
MCV: 88.6 fL (ref 80.0–100.0)
Monocytes Absolute: 0.2 10*3/uL (ref 0.2–1.0)
Monocytes Relative: 7 %
Neutro Abs: 3 10*3/uL (ref 1.4–6.5)
Neutrophils Relative %: 86 %
Platelets: 119 10*3/uL — ABNORMAL LOW (ref 150–440)
RBC: 3.52 MIL/uL — ABNORMAL LOW (ref 4.40–5.90)
RDW: 18.2 % — ABNORMAL HIGH (ref 11.5–14.5)
WBC: 3.5 10*3/uL — ABNORMAL LOW (ref 3.8–10.6)

## 2017-09-10 LAB — BASIC METABOLIC PANEL
Anion gap: 8 (ref 5–15)
BUN: 15 mg/dL (ref 6–20)
CO2: 25 mmol/L (ref 22–32)
Calcium: 8.8 mg/dL — ABNORMAL LOW (ref 8.9–10.3)
Chloride: 102 mmol/L (ref 101–111)
Creatinine, Ser: 0.98 mg/dL (ref 0.61–1.24)
GFR calc Af Amer: >60 mL/min (ref >60)
GFR calc non Af Amer: >60 mL/min (ref >60)
Glucose, Bld: 144 mg/dL — ABNORMAL HIGH (ref 65–99)
Potassium: 3.8 mmol/L (ref 3.5–5.1)
Sodium: 135 mmol/L (ref 135–145)

## 2017-09-10 LAB — MAGNESIUM: Magnesium: 1.6 mg/dL — ABNORMAL LOW (ref 1.7–2.4)

## 2017-09-10 NOTE — Progress Notes (Signed)
Patient offers no complaints today. 

## 2017-09-10 NOTE — Progress Notes (Addendum)
[facility] day:  09/10/2017  Chief Complaint: Aaron Becker is a 83 y.o. male with esophageal carcinoma who is seen for a 1 week assessment following completion of chemotherapy.  HPI:  The patient was last seen in the medical  oncology clinic on 09/03/2017.  At that time, he was doing well. He verbalized no acute complaints. His previously reported sore throat had improved.  He had increasing dysgeusia; stated, "water don't even taste right".  Weight down 3 pounds. Exam was unremarkable. CBC and CMP normal.  Magnesium was low at 1.6.  He received week #6 chemotherapy and 1 gm of IV magnesium.  Radiation continued.  In the interim, patient has continued to do well overall. He denies acute concerns today. He continues to experience posterior pharyngeal pain associated with his radiation treatments. Patient has some very minor dysphagia. Patient is using warm salt water gargles, which "help a lot" to relieve his pain. Patient eating "ok". He notes that things "just don't taste good or the same as they use to".  He continues to eat mainly soft foods at this point. He lost another 1 pound since his previous visit last week, and a total of 4 pounds since the beginning of this month.   Patient denies any B symptoms or interval infections. He notes that he is glad that he is done with his chemotherapy treatments, and is looking forward to completing his last two radiation treatments this week. His last treatment is scheduled for 09/13/2017.  Past Medical History:  Diagnosis Date  . Anemia   . Arthritis    hands  . Bradycardia   . Colon polyp   . Diabetes mellitus, type II ([place]) 1986   oral meds  . Elevated PSA 2011   per Dr. Jacqlyn Larsen with Uro no problems last 5 or 6 yrs  . Esophageal cancer ([place]) dx sept 18 2018  . HOH (hard of hearing)    bilateral AIDES  . Hyperlipidemia   . Hypertension 02/02   off bp meds since hospitalization 3 weeks ago  .  Hypothyroidism 1980's  . Palpitations   . Right rib fracture 2015  . SVT (supraventricular tachycardia) ([place])    a. reported SVT during admission at [facility] 06/2014  . Syncope and collapse    none recent last few weeks  . Wears dentures    upper and lower    Past Surgical History:  Procedure Laterality Date  . [place], 09/2015   COMPRESSED FRACTURE SPINE  . carotid ultrasound  02/02   wnl  . CATARACT EXTRACTION W/PHACO Right 03/19/2016   Procedure: CATARACT EXTRACTION PHACO AND INTRAOCULAR LENS PLACEMENT (IOC);  Surgeon: Ronnell Freshwater, MD;  Location: [facility];  Service: Ophthalmology;  Laterality: Right;  DIABETIC-oral med RIGHT  . CATARACT EXTRACTION W/PHACO Left 04/23/2016   Procedure: CATARACT EXTRACTION PHACO AND INTRAOCULAR LENS PLACEMENT (IOC);  Surgeon: Ronnell Freshwater, MD;  Location: [facility];  Service: Ophthalmology;  Laterality: Left;  LEFT DIABETIC - oral meds  . CHOLECYSTECTOMY  1967  . COLONOSCOPY W/ POLYPECTOMY  2012   Villous adenoma from the rectum without high-grade dysplasia, 10 mm  . COLONOSCOPY WITH PROPOFOL N/A 03/30/2015   Procedure: COLONOSCOPY WITH PROPOFOL;  Surgeon: Robert Bellow, MD;  Location[facility] ENDOSCOPY;  Service: Endoscopy;  Laterality: N/A;  . CYST EXCISION     thumb  . CYSTECTOMY  06/14/04   mucous cyst excision with left  thumb, IP joint debridement  . ESOPHAGOGASTRODUODENOSCOPY (EGD) WITH PROPOFOL N/A 06/11/2017   Procedure: ESOPHAGOGASTRODUODENOSCOPY (EGD) WITH PROPOFOL;  Surgeon: Lucilla Lame, MD;  Location: [facility] ENDOSCOPY;  Service: Endoscopy;  Laterality: N/A;  . EUS N/A 07/04/2017   Procedure: UPPER ENDOSCOPIC ULTRASOUND (EUS) RADIAL;  Surgeon: [name], MD;  Location: [facility] ENDOSCOPY;  Service: Endoscopy;  Laterality: N/A;  . FLEXIBLE SIGMOIDOSCOPY N/A 06/11/2017   Procedure: FLEXIBLE SIGMOIDOSCOPY;  Surgeon: Lucilla Lame, MD;  Location: [facility] ENDOSCOPY;  Service: Endoscopy;   Laterality: N/A;  . HERNIA REPAIR  2013  . KYPHOPLASTY N/A 10/06/2015   Procedure: Thoracic twelve kyphoplasty;  Surgeon: Karie Chimera, MD;  Location: [facility] NEURO ORS;  Service: Neurosurgery;  Laterality: N/A;  T12 Kyphoplasty  . PROSTATE BIOPSY  11/01/08   Dr. Reece Agar  . [place]  . VASECTOMY  1975    Family History  Problem Relation Age of Onset  . Cancer Father        liver  . Cancer Sister        breast  . Cancer Brother        skin  . Cancer Sister        breast  . Heart disease Sister        pacer  . Heart disease Sister   . Cancer Sister        breast cancer  . Heart disease Sister   . Colon cancer Neg Hx   . Prostate cancer Neg Hx     Social History:  reports that  has never smoked. he has never used smokeless tobacco. He reports that he does not drink alcohol or use drugs.  Retired Agricultural consultant; worked for [employer] for 44 years.  He was in the [employer].  He denies any exposure to radiation or toxins.  The patient is accompanied by 2 children today.   Allergies:  Allergies  Allergen Reactions  . Flomax [Tamsulosin Hcl] Other (See Comments)    syncope  . Ibuprofen Other (See Comments)    Oral blisters at high doses  . Rosuvastatin     REACTION: muscle stiffness  . Sulfa Antibiotics Rash  . Sulfonamide Derivatives Rash    Current Medications: Current Outpatient Medications  Medication Sig Dispense Refill  . acetaminophen (TYLENOL) 500 MG tablet Take 500-1,000 mg by mouth every 6 (six) hours as needed (for pain.).    Marland Kitchen Ascorbic Acid (VITAMIN C) 1000 MG tablet Take 1,000 mg by mouth daily.    . benzonatate (TESSALON) 100 MG capsule Take 100 mg by mouth at bedtime as needed for cough.    . Chlorpheniramine-DM (CORICIDIN HBP COUGH/COLD PO) Take by mouth 4 (four) times daily as needed.    . diphenhydrAMINE (BENADRYL) 25 mg capsule Take 25 mg by mouth at bedtime as needed (for sleep.).     Marland Kitchen docusate sodium (COLACE) 100 MG capsule  Take 100 mg by mouth 2 (two) times daily as needed (for constipation).    . ferrous sulfate 325 (65 FE) MG tablet Take 1 tablet (325 mg total) by mouth daily with breakfast.    . folic acid (FOLVITE) 509 MCG tablet Take 400 mcg by mouth daily with lunch.     . glimepiride (AMARYL) 1 MG tablet Take 2 tablets (2 mg total) by mouth daily with breakfast. 180 tablet 3  . glucose blood (ONE TOUCH ULTRA TEST) test strip TO TEST BLOOD SUGAR ONCE OR TWICE DAILY AND AS DIRECTED DX E11.9.  H/o  variable blood sugars. (Patient taking differently: 1 each by Other route 2 (two) times daily. TO TEST BLOOD SUGAR ONCE OR TWICE DAILY AND AS DIRECTED DX E11.9.  H/o variable blood sugars.) 200 each 3  . levothyroxine (SYNTHROID, LEVOTHROID) 75 MCG tablet Take 1 tablet (75 mcg total) by mouth daily. (Patient taking differently: Take 75 mcg by mouth daily before breakfast. ) 90 tablet 3  . metFORMIN (GLUCOPHAGE) 500 MG tablet Take 1-2 tablets (500-1,000 mg total) by mouth 2 (two) times daily with a meal. 500 mg every morning and 1000 mg daily with supper (may take 1 extra tablet at night if needed)    . Multiple Vitamin (MULTIVITAMIN WITH MINERALS) TABS tablet Take 1 tablet by mouth daily. Centrum Silver    . pantoprazole (PROTONIX) 40 MG tablet Take 1 tablet (40 mg total) 2 (two) times daily by mouth. 180 tablet 1  . pravastatin (PRAVACHOL) 80 MG tablet Take 1 tablet (80 mg total) by mouth at bedtime.    . pyridOXINE (VITAMIN B-6) 100 MG tablet Take 100 mg by mouth 3 (three) times a week.      No current facility-administered medications for this visit.     Review of Systems:  GENERAL:  Feels "good". No fevers or sweats. Weight down 1 pound. PERFORMANCE STATUS (ECOG):  2 HEENT:  Sore throat, improved. Dysgeusia.  Occupation related hearing loss.  Bilateral hearing aids.  No visual changes, runny nose, mouth sores or tenderness. Lungs: No shortness of breath.  No hemoptysis. Cardiac:  No chest pain, palpitations,  orthopnea, or PND. GI:  Slight dysphagia related to radiation esophagitis. Infrequent constipation.  No nausea, vomiting, diarrhea, melena or hematochezia. GU:  No urgency, frequency, dysuria, or hematuria. Musculoskeletal:  No back pain.  No joint pain.  No muscle tenderness. Extremities:  Arthritis in hands; joints stiff and "ache". No swelling. Skin:  No rashes or skin changes. Neuro:  No headache, numbness or weakness, balance or coordination issues. Endocrine:  Diabetes, controlled.  Hypothyroid on supplementation.  No hot flashes or night sweats. Psych:  No mood changes, depression or anxiety. Pain:  No focal pain. Review of systems:  All other systems reviewed and found to be negative.  Physical Exam: Blood pressure (!) 168/84, pulse 76, temperature 98.9 F (37.2 C), temperature source Tympanic, resp. rate 18, weight 146 lb 1 oz (66.3 kg). GENERAL:  Thin elderly gentleman sitting comfortably in the exam room in no acute distress. MENTAL STATUS:  Alert and oriented to person, place and time. HEAD:  Pearline Cables thin hair.  Normocephalic, atraumatic, face symmetric, no Cushingoid features. EYES:  Glasses.  Blue eyes.  Pupils equal round and reactive to light and accomodation.  No conjunctivitis or scleral icterus. ENT:  Oropharynx clear without lesion.  Tongue normal.  Dentures.  Mucous membranes moist.  RESPIRATORY:  Clear to auscultation without rales, wheezes or rhonchi. CARDIOVASCULAR:  Regular rate and rhythm without murmur, rub or gallop. ABDOMEN:  Soft, non-tender, with active bowel sounds, and no hepatosplenomegaly.  No masses. SKIN:  Bruising on hands.  No rashes, ulcers or lesions. EXTREMITIES: Arthritic nodules in hands. No edema, no skin discoloration or tenderness.  No palpable cords. LYMPH NODES: No palpable cervical, supraclavicular, axillary or inguinal adenopathy  NEUROLOGICAL: Unremarkable. PSYCH:  Appropriate.   Appointment on 09/10/2017  Component Date Value Ref Range  Status  . Sodium 09/10/2017 135  135 - 145 mmol/L Final  . Potassium 09/10/2017 3.8  3.5 - 5.1 mmol/L Final  . Chloride 09/10/2017 102  101 - 111 mmol/L Final  . CO2 09/10/2017 25  22 - 32 mmol/L Final  . Glucose, Bld 09/10/2017 144* 65 - 99 mg/dL Final  . BUN 09/10/2017 15  6 - 20 mg/dL Final  . Creatinine, Ser 09/10/2017 0.98  0.61 - 1.24 mg/dL Final  . Calcium 09/10/2017 8.8* 8.9 - 10.3 mg/dL Final  . GFR calc non Af Amer 09/10/2017 >60  >60 mL/min Final  . GFR calc Af Amer 09/10/2017 >60  >60 mL/min Final   Comment: (NOTE) The eGFR has been calculated using the CKD EPI equation. This calculation has not been validated in all clinical situations. eGFR's persistently <60 mL/min signify possible Chronic Kidney Disease.   . Anion gap 09/10/2017 8  5 - 15 Final  . WBC 09/10/2017 3.5* 3.8 - 10.6 K/uL Final  . RBC 09/10/2017 3.52* 4.40 - 5.90 MIL/uL Final  . Hemoglobin 09/10/2017 10.3* 13.0 - 18.0 g/dL Final  . HCT 09/10/2017 31.2* 40.0 - 52.0 % Final  . MCV 09/10/2017 88.6  80.0 - 100.0 fL Final  . MCH 09/10/2017 29.3  26.0 - 34.0 pg Final  . MCHC 09/10/2017 33.1  32.0 - 36.0 g/dL Final  . RDW 09/10/2017 18.2* 11.5 - 14.5 % Final  . Platelets 09/10/2017 119* 150 - 440 K/uL Final  . Neutrophils Relative % 09/10/2017 86  % Final  . Neutro Abs 09/10/2017 3.0  1.4 - 6.5 K/uL Final  . Lymphocytes Relative 09/10/2017 5  % Final  . Lymphs Abs 09/10/2017 0.2* 1.0 - 3.6 K/uL Final  . Monocytes Relative 09/10/2017 7  % Final  . Monocytes Absolute 09/10/2017 0.2  0.2 - 1.0 K/uL Final  . Eosinophils Relative 09/10/2017 1  % Final  . Eosinophils Absolute 09/10/2017 0.0  0 - 0.7 K/uL Final  . Basophils Relative 09/10/2017 1  % Final  . Basophils Absolute 09/10/2017 0.0  0 - 0.1 K/uL Final  . Magnesium 09/10/2017 1.6* 1.7 - 2.4 mg/dL Final    Assessment:  Aaron Becker is a 83 y.o. male with clinical stage II esophageal cancer (uT2N0).  He presented with an upper GI bleed.  EGD on 06/11/2017  revealed one cratered esophageal ulcer and stigmata of recent esophogeal bleeding at the GE junction.  Biopsy revealed poorly differentiated adenocarcinoma, signet ring type.  There was one non-bleeding cratered gastric ulcer with no stigmata of bleeding in the gastric antrum.  Duodenum was normal.  Colonoscopy on 03/30/2015 revealed a 20 mm tubulovillous adenoma of the rectum.  Flexible sigmoidoscopy on 06/01/2017 revealed a sessile polyp in the rectum and grade II non-bleeding internal hemorrhoids.  PET scan  on 06/27/2017 that revealed no appreciable abnormal esophageal hypermetabolic activity to correspond with the reported tumor. There was no adenopathy or other findings of metastatic disease. There was question that the original tumor is either low-grade or very small.  Upper endoscopic ultrasound (EUS) on 07/04/2017 revealed a 1-2cm ulcerated mass at the GE junction that was partially circumferential and nonobstructive. The mass is 1 cm in thickness and occupies 3/4 of the  lumen circumference at the GE junction. The mass clearly passes into, but not through the muscularis propria layer of the esophageal wall. There was no paraesophageal adenopathy. Clinical stage was uT2N0 GE junction adenocarcinoma.  He is s/p week #5 Carboplatin and Taxol with radiation (07/29/2017 - 08/26/2017).  Symptomatically, patient is doing "fine". He has continued sore throat that is associated with his radiation treatments. His appetite remains "good" and he is eating mainly  soft foods. Weight down 1 pound since his last visit, and a total of 4 pounds since the beginning of this month. He notes increasing dysgeusia.  He denies any melena or hematochezia.  Exam is unremarkable. Labs are stable. Mg is low at 1.6.   Plan: 1.  Labs today:  CBC with diff, CMP, Mg.  2.  Continue daily radiation per Dr. Baruch Gouty. Scheduled to complete therapy on 09/13/2017. 3.  Discuss slightly low magnesium (1.6). Offered to repeat 1 gram  IV magnesium replacement today, however family elected to try oral supplements. Cautioned over use due to associated diarrhea. Will plan on rechecking magnesium during next RTC visit.  4.  Discuss weight loss. Patient slowly losing weight despite eating "pretty good". Patient has posterior pharyngeal pain related to his radiation treatments. He also has dysgeusia. Both of which are expected to improve with his therapy completion this week. In the interim , patient encouraged to increase protein and calorie intake to the extent possible. Discussed pleasure foods. Diet will need to consist of soft items and supplemental shakes given the patient's painful throat.  5.  Discussed follow up plans. Questions fielded regarding "where do we go from here"? Discussed interval imaging once his radiation has completed. Anticipate post-treatment imaging in 2-3 months. Will discuss timeline with Dr. Mike Gip and review with patient during his next RTC visit.   6.  RTC in 1 month for MD assessment and labs (CBC with diff, BMP, Mg).     Honor Loh, NP  09/10/2017, 3:01 PM

## 2017-09-11 ENCOUNTER — Ambulatory Visit
Admission: RE | Admit: 2017-09-11 | Discharge: 2017-09-11 | Disposition: A | Discharge status: Home / Self Care | Payer: Medicare Other | Source: Ambulatory Visit | Attending: Radiation Oncology | Admitting: Radiation Oncology

## 2017-09-11 ENCOUNTER — Ambulatory Visit: Payer: Medicare Other

## 2017-09-11 DIAGNOSIS — C155 Malignant neoplasm of lower third of esophagus: Secondary | ICD-10-CM | POA: Diagnosis not present

## 2017-09-12 ENCOUNTER — Ambulatory Visit
Admission: RE | Admit: 2017-09-12 | Discharge: 2017-09-12 | Disposition: A | Discharge status: Home / Self Care | Payer: Medicare Other | Source: Ambulatory Visit | Attending: Radiation Oncology | Admitting: Radiation Oncology

## 2017-09-12 DIAGNOSIS — C155 Malignant neoplasm of lower third of esophagus: Secondary | ICD-10-CM | POA: Diagnosis not present

## 2017-09-30 DIAGNOSIS — E119 Type 2 diabetes mellitus without complications: Secondary | ICD-10-CM | POA: Diagnosis not present

## 2017-10-09 ENCOUNTER — Other Ambulatory Visit: Payer: Self-pay | Admitting: *Deleted

## 2017-10-09 DIAGNOSIS — C159 Malignant neoplasm of esophagus, unspecified: Secondary | ICD-10-CM

## 2017-10-10 ENCOUNTER — Inpatient Hospital Stay (HOSPITAL_BASED_OUTPATIENT_CLINIC_OR_DEPARTMENT_OTHER): Payer: Medicare Other | Admitting: Hematology and Oncology

## 2017-10-10 ENCOUNTER — Inpatient Hospital Stay: Payer: Medicare Other | Attending: Hematology and Oncology

## 2017-10-10 VITALS — BP 171/77 | HR 73 | Temp 98.8°F | Resp 20 | Wt 142.6 lb

## 2017-10-10 DIAGNOSIS — C160 Malignant neoplasm of cardia: Secondary | ICD-10-CM | POA: Insufficient documentation

## 2017-10-10 DIAGNOSIS — E039 Hypothyroidism, unspecified: Secondary | ICD-10-CM

## 2017-10-10 DIAGNOSIS — I10 Essential (primary) hypertension: Secondary | ICD-10-CM | POA: Insufficient documentation

## 2017-10-10 DIAGNOSIS — R634 Abnormal weight loss: Secondary | ICD-10-CM | POA: Diagnosis not present

## 2017-10-10 DIAGNOSIS — Z7984 Long term (current) use of oral hypoglycemic drugs: Secondary | ICD-10-CM | POA: Diagnosis not present

## 2017-10-10 DIAGNOSIS — Z79899 Other long term (current) drug therapy: Secondary | ICD-10-CM | POA: Insufficient documentation

## 2017-10-10 DIAGNOSIS — K2210 Ulcer of esophagus without bleeding: Secondary | ICD-10-CM | POA: Insufficient documentation

## 2017-10-10 DIAGNOSIS — R432 Parageusia: Secondary | ICD-10-CM

## 2017-10-10 DIAGNOSIS — Z7982 Long term (current) use of aspirin: Secondary | ICD-10-CM

## 2017-10-10 DIAGNOSIS — E785 Hyperlipidemia, unspecified: Secondary | ICD-10-CM | POA: Diagnosis not present

## 2017-10-10 DIAGNOSIS — Z8719 Personal history of other diseases of the digestive system: Secondary | ICD-10-CM

## 2017-10-10 DIAGNOSIS — M1990 Unspecified osteoarthritis, unspecified site: Secondary | ICD-10-CM

## 2017-10-10 DIAGNOSIS — E119 Type 2 diabetes mellitus without complications: Secondary | ICD-10-CM

## 2017-10-10 DIAGNOSIS — C159 Malignant neoplasm of esophagus, unspecified: Secondary | ICD-10-CM

## 2017-10-10 DIAGNOSIS — Z923 Personal history of irradiation: Secondary | ICD-10-CM | POA: Diagnosis not present

## 2017-10-10 DIAGNOSIS — Z86010 Personal history of colonic polyps: Secondary | ICD-10-CM | POA: Diagnosis not present

## 2017-10-10 DIAGNOSIS — Z882 Allergy status to sulfonamides status: Secondary | ICD-10-CM | POA: Insufficient documentation

## 2017-10-10 DIAGNOSIS — Z7189 Other specified counseling: Secondary | ICD-10-CM

## 2017-10-10 LAB — COMPREHENSIVE METABOLIC PANEL
ALT: 26 U/L (ref 17–63)
AST: 31 U/L (ref 15–41)
Albumin: 3.7 g/dL (ref 3.5–5.0)
Alkaline Phosphatase: 58 U/L (ref 38–126)
Anion gap: 9 (ref 5–15)
BUN: 17 mg/dL (ref 6–20)
CO2: 26 mmol/L (ref 22–32)
Calcium: 9.1 mg/dL (ref 8.9–10.3)
Chloride: 100 mmol/L — ABNORMAL LOW (ref 101–111)
Creatinine, Ser: 1.06 mg/dL (ref 0.61–1.24)
GFR calc Af Amer: >60 mL/min (ref >60)
GFR calc non Af Amer: >60 mL/min (ref >60)
Glucose, Bld: 156 mg/dL — ABNORMAL HIGH (ref 65–99)
Potassium: 3.9 mmol/L (ref 3.5–5.1)
Sodium: 135 mmol/L (ref 135–145)
Total Bilirubin: 0.6 mg/dL (ref 0.3–1.2)
Total Protein: 6.8 g/dL (ref 6.5–8.1)

## 2017-10-10 LAB — CBC WITH DIFFERENTIAL/PLATELET
Basophils Absolute: 0.1 10*3/uL (ref 0–0.1)
Basophils Relative: 1 %
Eosinophils Absolute: 0 10*3/uL (ref 0–0.7)
Eosinophils Relative: 1 %
HCT: 33.5 % — ABNORMAL LOW (ref 40.0–52.0)
Hemoglobin: 11.1 g/dL — ABNORMAL LOW (ref 13.0–18.0)
Lymphocytes Relative: 5 %
Lymphs Abs: 0.3 10*3/uL — ABNORMAL LOW (ref 1.0–3.6)
MCH: 30.1 pg (ref 26.0–34.0)
MCHC: 33.1 g/dL (ref 32.0–36.0)
MCV: 90.8 fL (ref 80.0–100.0)
Monocytes Absolute: 0.6 10*3/uL (ref 0.2–1.0)
Monocytes Relative: 9 %
Neutro Abs: 5.4 10*3/uL (ref 1.4–6.5)
Neutrophils Relative %: 84 %
Platelets: 196 10*3/uL (ref 150–440)
RBC: 3.69 MIL/uL — ABNORMAL LOW (ref 4.40–5.90)
RDW: 21.8 % — ABNORMAL HIGH (ref 11.5–14.5)
WBC: 6.4 10*3/uL (ref 3.8–10.6)

## 2017-10-10 LAB — MAGNESIUM: Magnesium: 1.7 mg/dL (ref 1.7–2.4)

## 2017-10-10 MED ORDER — PANTOPRAZOLE SODIUM 40 MG PO TBEC: 40.0000 mg | DELAYED_RELEASE_TABLET | Freq: Two times a day (BID) | ORAL | 1 refills | Status: DC

## 2017-10-10 NOTE — Progress Notes (Signed)
Appetite is still not 100% due to not being able to taste very well.  Patient states water is tasting better so he is able to drink more.  Requesting refill on Protonix.  Order pended.

## 2017-10-10 NOTE — Progress Notes (Signed)
[facility] [facility] day:  10/10/2017  Chief Complaint: Aaron Becker is a 84 y.o. male with stage IIB esophageal carcinoma who is seen for assessment following completion of concurrent chemotherapy and radiation.  HPI:  The patient was last seen in the medical  oncology clinic on 09/03/2017 by Honor Loh, NP.   At that time, he was doing "fine". He had continued sore throat that was associated with his radiation. His appetite remained "good" and he was eating mainly soft foods. Weight was down 1 pound with a total of 4 pounds since the beginning of Deember.  He noted increasing dysgeusia.  He denied any melena or hematochezia.  Exam was unremarkable.  Labs were stable.  Magnesium was low at 1.6.  He received oral magnesium.  He completed radiation on 2020-01-517.  During the interim, patient has been doing "fine".  He some some slight residual dysphagia. He notes that his dysgeusia has improved some. Patient states, "water tastes like water again".  Patient is starting to eat "better". He is not eating "much meat". Patient's weight has decreased another 4 pounds.   Despite being prescribed the oral magnesium, he has never started taking the medication because he was concerned that it may cause him to have diarrhea.  Patient denies any B symptoms or interval infections.    Past Medical History:  Diagnosis Date  . Anemia   . Arthritis    hands  . Bradycardia   . Colon polyp   . Diabetes mellitus, type II ([place]) 1986   oral meds  . Elevated PSA 2011   per Dr. Jacqlyn Larsen with Uro no problems last 5 or 6 yrs  . Esophageal cancer ([place]) dx sept 18 2018  . HOH (hard of hearing)    bilateral AIDES  . Hyperlipidemia   . Hypertension 02/02   off bp meds since hospitalization 3 weeks ago  . Hypothyroidism 1980's  . Palpitations   . Right rib fracture 2015  . SVT (supraventricular tachycardia) ([place])    a. reported SVT during [facility] 06/2014  . Syncope and  collapse    none recent last few weeks  . Wears dentures    upper and lower    Past Surgical History:  Procedure Laterality Date  . [place], 09/2015   COMPRESSED FRACTURE SPINE  . carotid ultrasound  02/02   wnl  . CATARACT EXTRACTION W/PHACO Right 03/19/2016   Procedure: CATARACT EXTRACTION PHACO AND INTRAOCULAR LENS PLACEMENT (IOC);  Surgeon: Ronnell Freshwater, MD;  Location: [place];  Service: Ophthalmology;  Laterality: Right;  DIABETIC-oral med RIGHT  . CATARACT EXTRACTION W/PHACO Left 04/23/2016   Procedure: CATARACT EXTRACTION PHACO AND INTRAOCULAR LENS PLACEMENT (IOC);  Surgeon: Ronnell Freshwater, MD;  Location: [place];  Service: Ophthalmology;  Laterality: Left;  LEFT DIABETIC - oral meds  . CHOLECYSTECTOMY  1967  . COLONOSCOPY W/ POLYPECTOMY  2012   Villous adenoma from the rectum without high-grade dysplasia, 10 mm  . COLONOSCOPY WITH PROPOFOL N/A 03/30/2015   Procedure: COLONOSCOPY WITH PROPOFOL;  Surgeon: Robert Bellow, MD;  Location[facility] ENDOSCOPY;  Service: Endoscopy;  Laterality: N/A;  . CYST EXCISION     thumb  . CYSTECTOMY  06/14/04   mucous cyst excision with left thumb, IP joint debridement  . ESOPHAGOGASTRODUODENOSCOPY (EGD) WITH PROPOFOL N/A 06/11/2017   Procedure: ESOPHAGOGASTRODUODENOSCOPY (EGD) WITH PROPOFOL;  Surgeon: Lucilla Lame, MD;  Location: [facility] ENDOSCOPY;  Service:  Endoscopy;  Laterality: N/A;  . EUS N/A 07/04/2017   Procedure: UPPER ENDOSCOPIC ULTRASOUND (EUS) RADIAL;  Surgeon: [name], MD;  Location: [facility] ENDOSCOPY;  Service: Endoscopy;  Laterality: N/A;  . FLEXIBLE SIGMOIDOSCOPY N/A 06/11/2017   Procedure: FLEXIBLE SIGMOIDOSCOPY;  Surgeon: Lucilla Lame, MD;  Location: [facility] ENDOSCOPY;  Service: Endoscopy;  Laterality: N/A;  . HERNIA REPAIR  2013  . KYPHOPLASTY N/A 10/06/2015   Procedure: Thoracic twelve kyphoplasty;  Surgeon: Karie Chimera, MD;  Location: [place] NEURO ORS;  Service: Neurosurgery;   Laterality: N/A;  T12 Kyphoplasty  . PROSTATE BIOPSY  11/01/08   Dr. Reece Agar  . [place]  . VASECTOMY  1975    Family History  Problem Relation Age of Onset  . Cancer Father        liver  . Cancer Sister        breast  . Cancer Brother        skin  . Cancer Sister        breast  . Heart disease Sister        pacer  . Heart disease Sister   . Cancer Sister        breast cancer  . Heart disease Sister   . Colon cancer Neg Hx   . Prostate cancer Neg Hx     Social History:  reports that  has never smoked. he has never used smokeless tobacco. He reports that he does not drink alcohol or use drugs.  Retired Agricultural consultant; worked for [employer] for 44 years.  He was in the [employer].  He denies any exposure to radiation or toxins.  The patient is accompanied by daughter today.   Allergies:  Allergies  Allergen Reactions  . Flomax [Tamsulosin Hcl] Other (See Comments)    syncope  . Ibuprofen Other (See Comments)    Oral blisters at high doses  . Rosuvastatin     REACTION: muscle stiffness  . Sulfa Antibiotics Rash  . Sulfonamide Derivatives Rash    Current Medications: Current Outpatient Medications  Medication Sig Dispense Refill  . acetaminophen (TYLENOL) 500 MG tablet Take 500-1,000 mg by mouth every 6 (six) hours as needed (for pain.).    Marland Kitchen Ascorbic Acid (VITAMIN C) 1000 MG tablet Take 1,000 mg by mouth daily.    . benzonatate (TESSALON) 100 MG capsule Take 100 mg by mouth at bedtime as needed for cough.    . Chlorpheniramine-DM (CORICIDIN HBP COUGH/COLD PO) Take by mouth 4 (four) times daily as needed.    . diphenhydrAMINE (BENADRYL) 25 mg capsule Take 25 mg by mouth at bedtime as needed (for sleep.).     Marland Kitchen docusate sodium (COLACE) 100 MG capsule Take 100 mg by mouth 2 (two) times daily as needed (for constipation).    . ferrous sulfate 325 (65 FE) MG tablet Take 1 tablet (325 mg total) by mouth daily with breakfast.    . folic acid  (FOLVITE) 709 MCG tablet Take 400 mcg by mouth daily with lunch.     . glimepiride (AMARYL) 1 MG tablet Take 2 tablets (2 mg total) by mouth daily with breakfast. 180 tablet 3  . glucose blood (ONE TOUCH ULTRA TEST) test strip TO TEST BLOOD SUGAR ONCE OR TWICE DAILY AND AS DIRECTED DX E11.9.  H/o variable blood sugars. (Patient taking differently: 1 each by Other route 2 (two) times daily. TO TEST BLOOD SUGAR ONCE OR TWICE DAILY AND AS DIRECTED DX E11.9.  H/o variable  blood sugars.) 200 each 3  . levothyroxine (SYNTHROID, LEVOTHROID) 75 MCG tablet Take 1 tablet (75 mcg total) by mouth daily. (Patient taking differently: Take 75 mcg by mouth daily before breakfast. ) 90 tablet 3  . metFORMIN (GLUCOPHAGE) 500 MG tablet Take 1-2 tablets (500-1,000 mg total) by mouth 2 (two) times daily with a meal. 500 mg every morning and 1000 mg daily with supper (may take 1 extra tablet at night if needed)    . Multiple Vitamin (MULTIVITAMIN WITH MINERALS) TABS tablet Take 1 tablet by mouth daily. Centrum Silver    . pantoprazole (PROTONIX) 40 MG tablet Take 1 tablet (40 mg total) 2 (two) times daily by mouth. 180 tablet 1  . pravastatin (PRAVACHOL) 80 MG tablet Take 1 tablet (80 mg total) by mouth at bedtime.    . pyridOXINE (VITAMIN B-6) 100 MG tablet Take 100 mg by mouth 3 (three) times a week.      No current facility-administered medications for this visit.     Review of Systems:  GENERAL:  Feels "fine".  No fevers or sweats. Weight down 1 pound. PERFORMANCE STATUS (ECOG):  2 HEENT:  Sore throat, improved. Dysgeusia.  Occupation related hearing loss.  Bilateral hearing aids.  No visual changes, runny nose, mouth sores or tenderness. Lungs: No shortness of breath.  No hemoptysis. Cardiac:  No chest pain, palpitations, orthopnea, or PND. GI:  Slight residual dysphagia related to radiation esophagitis. Infrequent constipation.  No nausea, vomiting, diarrhea, melena or hematochezia. GU:  No urgency, frequency,  dysuria, or hematuria. Musculoskeletal:  No back pain.  No joint pain.  No muscle tenderness. Extremities:  Arthritis in hands; joints stiff and "ache". No swelling. Skin:  No rashes or skin changes. Neuro:  No headache, numbness or weakness, balance or coordination issues. Endocrine:  Diabetes, controlled.  Hypothyroid on supplementation.  No hot flashes or night sweats. Psych:  No mood changes, depression or anxiety. Pain:  No focal pain. Review of systems:  All other systems reviewed and found to be negative.  Physical Exam: Blood pressure (!) 171/77, pulse 73, temperature 98.8 F (37.1 C), temperature source Tympanic, resp. rate 20, weight 142 lb 9 oz (64.7 kg). GENERAL:  Thin elderly gentleman sitting comfortably in the exam room in no acute distress. MENTAL STATUS:  Alert and oriented to person, place and time. HEAD:  Pearline Cables thin hair.  Normocephalic, atraumatic, face symmetric, no Cushingoid features. EYES:  Glasses.  Blue eyes.  Pupils equal round and reactive to light and accomodation.  No conjunctivitis or scleral icterus. ENT:  Oropharynx clear without lesion.  Tongue normal.  Dentures.  Mucous membranes moist.  RESPIRATORY:  Clear to auscultation without rales, wheezes or rhonchi. CARDIOVASCULAR:  Regular rate and rhythm without murmur, rub or gallop. ABDOMEN:  Soft, non-tender, with active bowel sounds, and no hepatosplenomegaly.  No masses. SKIN:  Bruising on hands.  No rashes, ulcers or lesions. EXTREMITIES: Arthritic nodules in hands. No edema, no skin discoloration or tenderness.  No palpable cords. LYMPH NODES: No palpable cervical, supraclavicular, axillary or inguinal adenopathy  NEUROLOGICAL: Unremarkable. PSYCH:  Appropriate.   Appointment on 10/10/2017  Component Date Value Ref Range Status  . Sodium 10/10/2017 135  135 - 145 mmol/L Final  . Potassium 10/10/2017 3.9  3.5 - 5.1 mmol/L Final  . Chloride 10/10/2017 100* 101 - 111 mmol/L Final  . CO2 10/10/2017 26   22 - 32 mmol/L Final  . Glucose, Bld 10/10/2017 156* 65 - 99 mg/dL Final  .  BUN 10/10/2017 17  6 - 20 mg/dL Final  . Creatinine, Ser 10/10/2017 1.06  0.61 - 1.24 mg/dL Final  . Calcium 10/10/2017 9.1  8.9 - 10.3 mg/dL Final  . Total Protein 10/10/2017 6.8  6.5 - 8.1 g/dL Final  . Albumin 10/10/2017 3.7  3.5 - 5.0 g/dL Final  . AST 10/10/2017 31  15 - 41 U/L Final  . ALT 10/10/2017 26  17 - 63 U/L Final  . Alkaline Phosphatase 10/10/2017 58  38 - 126 U/L Final  . Total Bilirubin 10/10/2017 0.6  0.3 - 1.2 mg/dL Final  . GFR calc non Af Amer 10/10/2017 >60  >60 mL/min Final  . GFR calc Af Amer 10/10/2017 >60  >60 mL/min Final   Comment: (NOTE) The eGFR has been calculated using the CKD EPI equation. This calculation has not been validated in all clinical situations. eGFR's persistently <60 mL/min signify possible Chronic Kidney Disease.   Georgiann Hahn gap 10/10/2017 9  5 - 15 Final   Performed at [facility], [address] [place], [place] [address]  . WBC 10/10/2017 6.4  3.8 - 10.6 K/uL Final  . RBC 10/10/2017 3.69* 4.40 - 5.90 MIL/uL Final  . Hemoglobin 10/10/2017 11.1* 13.0 - 18.0 g/dL Final  . HCT 10/10/2017 33.5* 40.0 - 52.0 % Final  . MCV 10/10/2017 90.8  80.0 - 100.0 fL Final  . MCH 10/10/2017 30.1  26.0 - 34.0 pg Final  . MCHC 10/10/2017 33.1  32.0 - 36.0 g/dL Final  . RDW 10/10/2017 21.8* 11.5 - 14.5 % Final  . Platelets 10/10/2017 196  150 - 440 K/uL Final  . Neutrophils Relative % 10/10/2017 84  % Final  . Neutro Abs 10/10/2017 5.4  1.4 - 6.5 K/uL Final  . Lymphocytes Relative 10/10/2017 5  % Final  . Lymphs Abs 10/10/2017 0.3* 1.0 - 3.6 K/uL Final  . Monocytes Relative 10/10/2017 9  % Final  . Monocytes Absolute 10/10/2017 0.6  0.2 - 1.0 K/uL Final  . Eosinophils Relative 10/10/2017 1  % Final  . Eosinophils Absolute 10/10/2017 0.0  0 - 0.7 K/uL Final  . Basophils Relative 10/10/2017 1  % Final  . Basophils Absolute 10/10/2017 0.1  0 - 0.1 K/uL Final   Performed  at [facility], [address] [place], [place] [address]  . Magnesium 10/10/2017 1.7  1.7 - 2.4 mg/dL Final   Performed at [facility], [address] [place], [place] [address]    Assessment:  Aaron Becker is a 84 y.o. male with clinical stage IIB esophageal cancer (uT2N0).  He presented with an upper GI bleed.  EGD on 06/11/2017 revealed one cratered esophageal ulcer and stigmata of recent esophogeal bleeding at the GE junction.  Biopsy revealed poorly differentiated adenocarcinoma, signet ring type.  There was one non-bleeding cratered gastric ulcer with no stigmata of bleeding in the gastric antrum.  Duodenum was normal.  Colonoscopy on 03/30/2015 revealed a 20 mm tubulovillous adenoma of the rectum.  Flexible sigmoidoscopy on 06/01/2017 revealed a sessile polyp in the rectum and grade II non-bleeding internal hemorrhoids.  PET scan  on 06/27/2017 that revealed no appreciable abnormal esophageal hypermetabolic activity to correspond with the reported tumor. There was no adenopathy or other findings of metastatic disease. There was question that the original tumor is either low-grade or very small.  Upper endoscopic ultrasound (EUS) on 07/04/2017 revealed a 1-2cm ulcerated mass at the GE junction that was partially circumferential and nonobstructive. The mass  is 1 cm in thickness and occupies 3/4 of the  lumen circumference at the GE junction. The mass clearly passes into, but not through the muscularis propria layer of the esophageal wall. There was no paraesophageal adenopathy. Clinical stage was uT2N0 GE junction adenocarcinoma.  He received 5 weeks of carboplatin and Taxol with radiation (07/29/2017 - 09/03/2017).  Radiation completed on 12/01/202018.  Symptomatically, patient is doing "fine". His appetite remains "good" and he is eating mainly soft foods. Weight down 4 pounds since his last visit.He notes improved dysphagia and dysgeusia.  He denies any melena or  hematochezia.  Exam is unremarkable. Labs are stable.   Plan: 1.  Labs today:  CBC with diff, BMP, Mg.  2.  Discuss plan for PET scan 3 months after completion of radiation (12/11/2017). 3.  Discuss plan for endoscopy in 3 months.  Typically EGDs are performed every 3-6 months x 2 years then annually x 3 years.  Chest and abdomen CT every 6-9 months x 2 years then annually x 3 years. Will discuss follow-up EGD with Dr. Allen Norris, as patient had EUS done in [place] due to scheduling conflicts.  4.  Discuss weight loss. Patient slowly losing weight despite eating "pretty good". His dysgeusia is improving.  Patient encouraged to increase protein and calorie intake to the extent possible.  Discussed pleasure foods. Diet will need to consist of soft items and supplemental shakes.  5.  RTC after PET scan for MD assessment and labs (CBC with diff, BMP, Mg).     Honor Loh, NP  10/10/2017, 12:15 PM   I saw and evaluated the patient, participating in the key portions of the service and reviewing pertinent diagnostic studies and records.  I reviewed the nurse practitioner's note and agree with the findings and the plan.  The assessment and plan were discussed with the patient.  Additional diagnostic studies of a PET scan is needed to assess disease following completion of therapy and would change the clinical management.  Several questions were asked by the patient and answered.   Nolon Stalls, MD 10/10/2017,12:15 PM

## 2017-10-12 ENCOUNTER — Encounter: Payer: Self-pay | Admitting: Hematology and Oncology

## 2017-10-12 DIAGNOSIS — R634 Abnormal weight loss: Secondary | ICD-10-CM | POA: Insufficient documentation

## 2017-10-14 ENCOUNTER — Other Ambulatory Visit: Payer: Self-pay

## 2017-10-14 ENCOUNTER — Ambulatory Visit
Admission: RE | Admit: 2017-10-14 | Discharge: 2017-10-14 | Disposition: A | Discharge status: Home / Self Care | Payer: Medicare Other | Source: Ambulatory Visit | Attending: Radiation Oncology | Admitting: Radiation Oncology

## 2017-10-14 ENCOUNTER — Encounter: Payer: Self-pay | Admitting: Radiation Oncology

## 2017-10-14 VITALS — BP 161/77 | HR 82 | Temp 97.3°F | Resp 20 | Wt 146.7 lb

## 2017-10-14 DIAGNOSIS — R634 Abnormal weight loss: Secondary | ICD-10-CM | POA: Insufficient documentation

## 2017-10-14 DIAGNOSIS — Z923 Personal history of irradiation: Secondary | ICD-10-CM | POA: Diagnosis not present

## 2017-10-14 DIAGNOSIS — C155 Malignant neoplasm of lower third of esophagus: Secondary | ICD-10-CM | POA: Diagnosis not present

## 2017-10-14 DIAGNOSIS — C159 Malignant neoplasm of esophagus, unspecified: Secondary | ICD-10-CM

## 2017-10-14 DIAGNOSIS — Z9221 Personal history of antineoplastic chemotherapy: Secondary | ICD-10-CM | POA: Insufficient documentation

## 2017-10-14 NOTE — Progress Notes (Signed)
Radiation Oncology Follow up Note  Name: Aaron Becker   Date:   10/14/2017 MRN:  [id number] DOB: 21-Feb-1930    This 84 y.o. male presents to the clinic today for one-month follow-up status post concurrent chemoradiation for stage IIb adenopathy carcinoma the esophagus.  REFERRING PROVIDER: Tonia Ghent, MD  HPI: Patient is an 84 year old male now one month out of concurrent chemoradiation therapy for a T2 N0 adenocarcinoma the distal esophagus.Marland Kitchen He is seen today in routine follow up and is doing well he pretty much is eating any type of food he desires. He's having very little dysphagia. Continues to have some slight weight loss not significant over time.  COMPLICATIONS OF TREATMENT: none  FOLLOW UP COMPLIANCE: keeps appointments   PHYSICAL EXAM:  BP (!) 161/77   Pulse 82   Temp (!) 97.3 F (36.3 C)   Resp 20   Wt 146 lb 11.5 oz (66.6 kg)   BMI 25.18 kg/m  Well-developed well-nourished patient in NAD. HEENT reveals PERLA, EOMI, discs not visualized.  Oral cavity is clear. No oral mucosal lesions are identified. Neck is clear without evidence of cervical or supraclavicular adenopathy. Lungs are clear to A&P. Cardiac examination is essentially unremarkable with regular rate and rhythm without murmur rub or thrill. Abdomen is benign with no organomegaly or masses noted. Motor sensory and DTR levels are equal and symmetric in the upper and lower extremities. Cranial nerves II through XII are grossly intact. Proprioception is intact. No peripheral adenopathy or edema is identified. No motor or sensory levels are noted. Crude visual fields are within normal range.  RADIOLOGY RESULTS: No current films for review  PLAN: At the present time patient is doing well recovering nicely from his concurrent chemoradiation for distal esophageal adenocarcinoma. I'm please was overall progress. He scheduled in March for a PET scan which I will review when it becomes available. He also may have a  follow-up endoscopic ultrasound the near future that is trying to be arranged with GI. Otherwise he continues close follow-up care with medical oncology. I've asked to see him back in 3-4 months for follow-up.  I would like to take this opportunity to thank you for allowing me to participate in the care of your patient.Noreene Filbert, MD

## 2017-12-04 ENCOUNTER — Encounter
Admission: RE | Admit: 2017-12-04 | Discharge: 2017-12-04 | Disposition: A | Discharge status: Home / Self Care | Payer: Medicare Other | Source: Ambulatory Visit | Attending: Urgent Care | Admitting: Urgent Care

## 2017-12-04 ENCOUNTER — Ambulatory Visit: Payer: Medicare Other

## 2017-12-04 DIAGNOSIS — C159 Malignant neoplasm of esophagus, unspecified: Secondary | ICD-10-CM | POA: Diagnosis not present

## 2017-12-04 DIAGNOSIS — C160 Malignant neoplasm of cardia: Secondary | ICD-10-CM | POA: Diagnosis not present

## 2017-12-04 LAB — GLUCOSE, CAPILLARY: GLUCOSE-CAPILLARY: 97 mg/dL (ref 65–99)

## 2017-12-04 MED ORDER — FLUDEOXYGLUCOSE F - 18 (FDG) INJECTION
7.6000 | Freq: Once | INTRAVENOUS | Status: AC | PRN
  Administered 2017-12-04: 7.1 via INTRAVENOUS

## 2017-12-05 ENCOUNTER — Encounter: Payer: Self-pay | Admitting: Urgent Care

## 2017-12-08 NOTE — Progress Notes (Signed)
PET scan performed on 12/04/2017; results reviewed.  There was mention of a small focus of mild uptake in the caudate lobe of the liver, with no corresponding CT abnormality.  radiologist recommended further evaluation with a liver MRI to exclude the possibility of potential underlying liver metastasis.  Case discussed at multidisciplinary tumor board on 12/05/2017.  Group consensus was that PET findings were nonspecific, however we should plan on proceeding with a liver MRI at this point to rule out any underlying pathology.  Primary oncologist, Dr. Mike Gip, is out of town at this point.  Will discuss tumor board recommendations with her when she returns.  Of note, the patient is being seen in clinic on 12/12/2017   At which time the PET findings will be reviewed, as well as the recommendations for the liver MRI.  Honor Loh, MSN, APRN, FNP-C, CEN Oncology/Hematology Nurse Practitioner  [place]

## 2017-12-12 ENCOUNTER — Inpatient Hospital Stay: Payer: Medicare Other | Attending: Hematology and Oncology

## 2017-12-12 ENCOUNTER — Inpatient Hospital Stay (HOSPITAL_BASED_OUTPATIENT_CLINIC_OR_DEPARTMENT_OTHER): Payer: Medicare Other | Admitting: Hematology and Oncology

## 2017-12-12 ENCOUNTER — Encounter: Payer: Self-pay | Admitting: Hematology and Oncology

## 2017-12-12 VITALS — BP 174/81 | HR 76 | Temp 98.8°F | Resp 16 | Wt 149.8 lb

## 2017-12-12 DIAGNOSIS — R432 Parageusia: Secondary | ICD-10-CM

## 2017-12-12 DIAGNOSIS — I10 Essential (primary) hypertension: Secondary | ICD-10-CM | POA: Diagnosis not present

## 2017-12-12 DIAGNOSIS — I700 Atherosclerosis of aorta: Secondary | ICD-10-CM

## 2017-12-12 DIAGNOSIS — Z7189 Other specified counseling: Secondary | ICD-10-CM

## 2017-12-12 DIAGNOSIS — E785 Hyperlipidemia, unspecified: Secondary | ICD-10-CM

## 2017-12-12 DIAGNOSIS — Z803 Family history of malignant neoplasm of breast: Secondary | ICD-10-CM | POA: Insufficient documentation

## 2017-12-12 DIAGNOSIS — Z79899 Other long term (current) drug therapy: Secondary | ICD-10-CM | POA: Insufficient documentation

## 2017-12-12 DIAGNOSIS — M1990 Unspecified osteoarthritis, unspecified site: Secondary | ICD-10-CM

## 2017-12-12 DIAGNOSIS — E119 Type 2 diabetes mellitus without complications: Secondary | ICD-10-CM | POA: Diagnosis not present

## 2017-12-12 DIAGNOSIS — R932 Abnormal findings on diagnostic imaging of liver and biliary tract: Secondary | ICD-10-CM | POA: Insufficient documentation

## 2017-12-12 DIAGNOSIS — R1310 Dysphagia, unspecified: Secondary | ICD-10-CM | POA: Diagnosis not present

## 2017-12-12 DIAGNOSIS — C159 Malignant neoplasm of esophagus, unspecified: Secondary | ICD-10-CM | POA: Diagnosis not present

## 2017-12-12 DIAGNOSIS — E039 Hypothyroidism, unspecified: Secondary | ICD-10-CM | POA: Diagnosis not present

## 2017-12-12 DIAGNOSIS — Z923 Personal history of irradiation: Secondary | ICD-10-CM

## 2017-12-12 DIAGNOSIS — Z7984 Long term (current) use of oral hypoglycemic drugs: Secondary | ICD-10-CM

## 2017-12-12 DIAGNOSIS — I2510 Atherosclerotic heart disease of native coronary artery without angina pectoris: Secondary | ICD-10-CM | POA: Diagnosis not present

## 2017-12-12 DIAGNOSIS — Z808 Family history of malignant neoplasm of other organs or systems: Secondary | ICD-10-CM | POA: Insufficient documentation

## 2017-12-12 DIAGNOSIS — Z9221 Personal history of antineoplastic chemotherapy: Secondary | ICD-10-CM | POA: Insufficient documentation

## 2017-12-12 LAB — COMPREHENSIVE METABOLIC PANEL
ALT: 25 U/L (ref 17–63)
AST: 30 U/L (ref 15–41)
Albumin: 3.8 g/dL (ref 3.5–5.0)
Alkaline Phosphatase: 62 U/L (ref 38–126)
Anion gap: 8 (ref 5–15)
BUN: 24 mg/dL — ABNORMAL HIGH (ref 6–20)
CO2: 25 mmol/L (ref 22–32)
Calcium: 9.2 mg/dL (ref 8.9–10.3)
Chloride: 103 mmol/L (ref 101–111)
Creatinine, Ser: 1.18 mg/dL (ref 0.61–1.24)
GFR calc Af Amer: >60 mL/min (ref >60)
GFR calc non Af Amer: 54 mL/min — ABNORMAL LOW (ref >60)
Glucose, Bld: 138 mg/dL — ABNORMAL HIGH (ref 65–99)
Potassium: 4.1 mmol/L (ref 3.5–5.1)
Sodium: 136 mmol/L (ref 135–145)
Total Bilirubin: 0.9 mg/dL (ref 0.3–1.2)
Total Protein: 7 g/dL (ref 6.5–8.1)

## 2017-12-12 LAB — CBC WITH DIFFERENTIAL/PLATELET
Basophils Absolute: 0.1 10*3/uL (ref 0–0.1)
Basophils Relative: 1 %
Eosinophils Absolute: 0.1 10*3/uL (ref 0–0.7)
Eosinophils Relative: 1 %
HCT: 33.7 % — ABNORMAL LOW (ref 40.0–52.0)
Hemoglobin: 11.3 g/dL — ABNORMAL LOW (ref 13.0–18.0)
Lymphocytes Relative: 5 %
Lymphs Abs: 0.4 10*3/uL — ABNORMAL LOW (ref 1.0–3.6)
MCH: 30.7 pg (ref 26.0–34.0)
MCHC: 33.4 g/dL (ref 32.0–36.0)
MCV: 92.1 fL (ref 80.0–100.0)
Monocytes Absolute: 0.6 10*3/uL (ref 0.2–1.0)
Monocytes Relative: 9 %
Neutro Abs: 5.6 10*3/uL (ref 1.4–6.5)
Neutrophils Relative %: 84 %
Platelets: 167 10*3/uL (ref 150–440)
RBC: 3.66 MIL/uL — ABNORMAL LOW (ref 4.40–5.90)
RDW: 13.6 % (ref 11.5–14.5)
WBC: 6.7 10*3/uL (ref 3.8–10.6)

## 2017-12-12 LAB — MAGNESIUM: Magnesium: 1.9 mg/dL (ref 1.7–2.4)

## 2017-12-12 NOTE — Progress Notes (Addendum)
[facility] day:  12/12/2017  Chief Complaint: Aaron Becker is a 84 y.o. male with stage IIB esophageal carcinoma who is seen for assessment after interval PET scan.  HPI:  The patient was last seen in the medical  oncology clinic on 10/10/2017.   At that time, he was doing "fine".  Appetite remained "good".  He was eating mainly soft foods. Weight was down 4 pounds since his last visit.  He noted improved dysphagia and dysgeusia.  He denied any melena or hematochezia.  Exam was unremarkable. Labs were stable.   PET scan on 12/04/2017 revealed a small focus of mild uptake (SUV 3.77; background 2.73) localizing to the caudate lobe of liver. There was no corresponding CT abnormality.  Consider further evaluation with liver MRI to exclude the remote possibility of underlying liver metastasis.  There was no additional abnormal foci of increased uptake identified to suggest residual or recurrent metabolically active tumor.  There was aortic atherosclerosis, LAD and RCA coronary artery calcifications.  During the interim, he has felt great.  He is eating well.  He has gained 7 pounds.  He denies any concerns.   Past Medical History:  Diagnosis Date  . Anemia   . Arthritis    hands  . Bradycardia   . Colon polyp   . Diabetes mellitus, type II ([place]) 1986   oral meds  . Elevated PSA 2011   per Dr. Jacqlyn Larsen with Uro no problems last 5 or 6 yrs  . Esophageal cancer ([place]) dx sept 18 2018  . HOH (hard of hearing)    bilateral AIDES  . Hyperlipidemia   . Hypertension 02/02   off bp meds since hospitalization 3 weeks ago  . Hypothyroidism 1980's  . Palpitations   . Right rib fracture 2015  . SVT (supraventricular tachycardia) ([place])    a. reported SVT during admission at [facility] 06/2014  . Syncope and collapse    none recent last few weeks  . Wears dentures    upper and lower    Past Surgical History:  Procedure Laterality Date  . [place],  09/2015   COMPRESSED FRACTURE SPINE  . carotid ultrasound  02/02   wnl  . CATARACT EXTRACTION W/PHACO Right 03/19/2016   Procedure: CATARACT EXTRACTION PHACO AND INTRAOCULAR LENS PLACEMENT (IOC);  Surgeon: Ronnell Freshwater, MD;  Location: [place];  Service: Ophthalmology;  Laterality: Right;  DIABETIC-oral med RIGHT  . CATARACT EXTRACTION W/PHACO Left 04/23/2016   Procedure: CATARACT EXTRACTION PHACO AND INTRAOCULAR LENS PLACEMENT (IOC);  Surgeon: Ronnell Freshwater, MD;  Location: [place];  Service: Ophthalmology;  Laterality: Left;  LEFT DIABETIC - oral meds  . CHOLECYSTECTOMY  1967  . COLONOSCOPY W/ POLYPECTOMY  2012   Villous adenoma from the rectum without high-grade dysplasia, 10 mm  . COLONOSCOPY WITH PROPOFOL N/A 03/30/2015   Procedure: COLONOSCOPY WITH PROPOFOL;  Surgeon: Robert Bellow, MD;  Location: [facility] ENDOSCOPY;  Service: Endoscopy;  Laterality: N/A;  . CYST EXCISION     thumb  . CYSTECTOMY  06/14/04   mucous cyst excision with left thumb, IP joint debridement  . ESOPHAGOGASTRODUODENOSCOPY (EGD) WITH PROPOFOL N/A 06/11/2017   Procedure: ESOPHAGOGASTRODUODENOSCOPY (EGD) WITH PROPOFOL;  Surgeon: Lucilla Lame, MD;  Location: [facility] ENDOSCOPY;  Service: Endoscopy;  Laterality: N/A;  . EUS N/A 07/04/2017   Procedure: UPPER ENDOSCOPIC ULTRASOUND (EUS) RADIAL;  Surgeon: [name], MD;  Location: [facility] ENDOSCOPY;  Service:  Endoscopy;  Laterality: N/A;  . FLEXIBLE SIGMOIDOSCOPY N/A 06/11/2017   Procedure: FLEXIBLE SIGMOIDOSCOPY;  Surgeon: Lucilla Lame, MD;  Location: [facility] ENDOSCOPY;  Service: Endoscopy;  Laterality: N/A;  . HERNIA REPAIR  2013  . KYPHOPLASTY N/A 10/06/2015   Procedure: Thoracic twelve kyphoplasty;  Surgeon: Karie Chimera, MD;  Location: [place] [facility]URO [facility]S;  Service: Neurosurgery;  Laterality: N/A;  T12 Kyphoplasty  . PROSTATE BIOPSY  11/01/08   Dr. Reece Agar  . [place]  . VASECTOMY  1975    Family History   Problem Relation Age of Onset  . Cancer Father        liver  . Cancer Sister        breast  . Cancer Brother        skin  . Cancer Sister        breast  . Heart disease Sister        pacer  . Heart disease Sister   . Cancer Sister        breast cancer  . Heart disease Sister   . Colon cancer Neg Hx   . Prostate cancer Neg Hx     Social History:  reports that he has never smoked. He has never used smokeless tobacco. He reports that he does not drink alcohol or use drugs.  Retired Agricultural consultant; worked for [employer] for 44 years.  He was in the [employer].  He denies any exposure to radiation or toxins.  The patient is accompanied by his wife, son, and daughter today.   Allergies:  Allergies  Allergen Reactions  . Flomax [Tamsulosin Hcl] Other (See Comments)    syncope  . Ibuprofen Other (See Comments)    Oral blisters at high doses  . Rosuvastatin     REACTION: muscle stiffness  . Sulfa Antibiotics Rash  . Sulfonamide Derivatives Rash    Current Medications: Current Outpatient Medications  Medication Sig Dispense Refill  . acetaminophen (TYLENOL) 500 MG tablet Take 500-1,000 mg by mouth every 6 (six) hours as needed (for pain.).    Marland Kitchen Ascorbic Acid (VITAMIN C) 1000 MG tablet Take 1,000 mg by mouth daily.    . benzonatate (TESSALON) 100 MG capsule Take 100 mg by mouth at bedtime as needed for cough.    . Chlorpheniramine-DM (CORICIDIN HBP COUGH/COLD PO) Take by mouth 4 (four) times daily as needed.    . diphenhydrAMINE (BENADRYL) 25 mg capsule Take 25 mg by mouth at bedtime as needed (for sleep.).     Marland Kitchen docusate sodium (COLACE) 100 MG capsule Take 100 mg by mouth 2 (two) times daily as needed (for constipation).    . ferrous sulfate 325 (65 FE) MG tablet Take 1 tablet (325 mg total) by mouth daily with breakfast.    . folic acid (FOLVITE) 209 MCG tablet Take 400 mcg by mouth daily with lunch.     . glimepiride (AMARYL) 1 MG tablet Take 2 tablets (2 mg total) by  mouth daily with breakfast. 180 tablet 3  . glucose blood (ONE TOUCH ULTRA TEST) test strip TO TEST BLOOD SUGAR ONCE OR TWICE DAILY AND AS DIRECTED DX E11.9.  H/o variable blood sugars. (Patient taking differently: 1 each by Other route 2 (two) times daily. TO TEST BLOOD SUGAR ONCE OR TWICE DAILY AND AS DIRECTED DX E11.9.  H/o variable blood sugars.) 200 each 3  . levothyroxine (SYNTHROID, LEVOTHROID) 75 MCG tablet Take 1 tablet (75 mcg total) by mouth daily. (Patient taking differently:  Take 75 mcg by mouth daily before breakfast. ) 90 tablet 3  . metFORMIN (GLUCOPHAGE) 500 MG tablet Take 1-2 tablets (500-1,000 mg total) by mouth 2 (two) times daily with a meal. 500 mg every morning and 1000 mg daily with supper (may take 1 extra tablet at night if needed)    . Multiple Vitamin (MULTIVITAMIN WITH MINERALS) TABS tablet Take 1 tablet by mouth daily. Centrum Silver    . pantoprazole (PROTONIX) 40 MG tablet Take 1 tablet (40 mg total) by mouth 2 (two) times daily. 180 tablet 1  . pravastatin (PRAVACHOL) 80 MG tablet Take 1 tablet (80 mg total) by mouth at bedtime.    . pyridOXINE (VITAMIN B-6) 100 MG tablet Take 100 mg by mouth 3 (three) times a week.      No current facility-administered medications for this visit.     Review of Systems:  GENERAL:  Feels "great".  No fevers or sweats. Weight up 7 pounds. PERFORMANCE STATUS (ECOG):  2 HEENT:  Occupation related hearing loss.  Bilateral hearing aids.  No visual changes, runny nose, mouth sores or tenderness. Lungs: No shortness of breath.  No hemoptysis. Cardiac:  No chest pain, palpitations, orthopnea, or PND. GI:  No swallowing issues.  Infrequent constipation.  No nausea, vomiting, diarrhea, melena or hematochezia. GU:  No urgency, frequency, dysuria, or hematuria. Musculoskeletal:  No back pain.  No joint pain.  No muscle tenderness. Extremities:  Arthritis in hands; joints stiff and "ache". No swelling. Skin:  No rashes or skin  changes. Neuro:  No headache, numbness or weakness, balance or coordination issues. Endocrine:  Diabetes, controlled.  Hypothyroid on supplementation.  No hot flashes or night sweats. Psych:  No mood changes, depression or anxiety. Pain:  No focal pain. Review of systems:  All other systems reviewed and found to be negative.  Physical Exam: Blood pressure (!) 174/81, pulse 76, temperature 98.8 F (37.1 C), temperature source Tympanic, resp. rate 16, weight 149 lb 12.8 oz (67.9 kg). GENERAL:  Thin elderly gentleman sitting comfortably in the exam room in no acute distress. MENTAL STATUS:  Alert and oriented to person, place and time. HEAD:  Pearline Cables thin hair.  Normocephalic, atraumatic, face symmetric, no Cushingoid features. EYES:  Glasses.  Blue eyes.  Pupils equal round and reactive to light and accomodation.  No conjunctivitis or scleral icterus. ENT:  Oropharynx clear without lesion.  Tongue normal.  Dentures.  Mucous membranes moist.  RESPIRATORY:  Clear to auscultation without rales, wheezes or rhonchi. CARDIOVASCULAR:  Regular rate and rhythm without murmur, rub or gallop. ABDOMEN:  Soft, non-tender, with active bowel sounds, and no hepatosplenomegaly.  No masses. SKIN: No rashes, ulcers or lesions. EXTREMITIES: Arthritic nodules in hands. No edema, no skin discoloration or tenderness.  No palpable cords. LYMPH NODES: No palpable cervical, supraclavicular, axillary or inguinal adenopathy  NEUROLOGICAL: Unremarkable. PSYCH:  Appropriate.   Appointment on 12/12/2017  Component Date Value Ref Range Status  . Magnesium 12/12/2017 1.9  1.7 - 2.4 mg/dL Final   Performed at [facility], [address], [place], [place] [address]  . Sodium 12/12/2017 136  135 - 145 mmol/L Final  . Potassium 12/12/2017 4.1  3.5 - 5.1 mmol/L Final  . Chloride 12/12/2017 103  101 - 111 mmol/L Final  . CO2 12/12/2017 25  22 - 32 mmol/L Final  . Glucose, Bld 12/12/2017 138* 65 - 99 mg/dL Final  . BUN  12/12/2017 24* 6 - 20 mg/dL Final  . Creatinine, Ser  12/12/2017 1.18  0.61 - 1.24 mg/dL Final  . Calcium 12/12/2017 9.2  8.9 - 10.3 mg/dL Final  . Total Protein 12/12/2017 7.0  6.5 - 8.1 g/dL Final  . Albumin 12/12/2017 3.8  3.5 - 5.0 g/dL Final  . AST 12/12/2017 30  15 - 41 U/L Final  . ALT 12/12/2017 25  17 - 63 U/L Final  . Alkaline Phosphatase 12/12/2017 62  38 - 126 U/L Final  . Total Bilirubin 12/12/2017 0.9  0.3 - 1.2 mg/dL Final  . GFR calc non Af Amer 12/12/2017 54* >60 mL/min Final  . GFR calc Af Amer 12/12/2017 >60  >60 mL/min Final   Comment: (NOTE) The eGFR has been calculated using the CKD EPI equation. This calculation has not been validated in all clinical situations. eGFR's persistently <60 mL/min signify possible Chronic Kidney Disease.   Georgiann Hahn gap 12/12/2017 8  5 - 15 Final   Performed at [facility], [address] [place], [place] [address]  . WBC 12/12/2017 6.7  3.8 - 10.6 K/uL Final  . RBC 12/12/2017 3.66* 4.40 - 5.90 MIL/uL Final  . Hemoglobin 12/12/2017 11.3* 13.0 - 18.0 g/dL Final  . HCT 12/12/2017 33.7* 40.0 - 52.0 % Final  . MCV 12/12/2017 92.1  80.0 - 100.0 fL Final  . MCH 12/12/2017 30.7  26.0 - 34.0 pg Final  . MCHC 12/12/2017 33.4  32.0 - 36.0 g/dL Final  . RDW 12/12/2017 13.6  11.5 - 14.5 % Final  . Platelets 12/12/2017 167  150 - 440 K/uL Final  . Neutrophils Relative % 12/12/2017 84  % Final  . Neutro Abs 12/12/2017 5.6  1.4 - 6.5 K/uL Final  . Lymphocytes Relative 12/12/2017 5  % Final  . Lymphs Abs 12/12/2017 0.4* 1.0 - 3.6 K/uL Final  . Monocytes Relative 12/12/2017 9  % Final  . Monocytes Absolute 12/12/2017 0.6  0.2 - 1.0 K/uL Final  . Eosinophils Relative 12/12/2017 1  % Final  . Eosinophils Absolute 12/12/2017 0.1  0 - 0.7 K/uL Final  . Basophils Relative 12/12/2017 1  % Final  . Basophils Absolute 12/12/2017 0.1  0 - 0.1 K/uL Final   Performed at [facility], [address], [place], [place] [address]     Assessment:  Aaron Becker is a 84 y.o. male with clinical stage IIB esophageal cancer (uT2N0).  He presented with an upper GI bleed.  EGD on 06/11/2017 revealed one cratered esophageal ulcer and stigmata of recent esophogeal bleeding at the GE junction.  Biopsy revealed poorly differentiated adenocarcinoma, signet ring type.  There was one non-bleeding cratered gastric ulcer with no stigmata of bleeding in the gastric antrum.  Duodenum was normal.  Colonoscopy on 03/30/2015 revealed a 20 mm tubulovillous adenoma of the rectum.  Flexible sigmoidoscopy on 06/01/2017 revealed a sessile polyp in the rectum and grade II non-bleeding internal hemorrhoids.  PET scan  on 06/27/2017 that revealed no appreciable abnormal esophageal hypermetabolic activity to correspond with the reported tumor. There was no adenopathy or other findings of metastatic disease. There was question that the original tumor is either low-grade or very small.  Upper endoscopic ultrasound (EUS) on 07/04/2017 revealed a 1-2cm ulcerated mass at the GE junction that was partially circumferential and nonobstructive. The mass is 1 cm in thickness and occupies 3/4 of the  lumen circumference at the GE junction. The mass clearly passes into, but not through the muscularis propria layer of the esophageal wall. There was no paraesophageal adenopathy.  Clinical stage was uT2N0 GE junction adenocarcinoma.  He received 5 weeks of carboplatin and Taxol with radiation (07/29/2017 - 09/03/2017).  Radiation completed on 10-28-202018.  PET scan on 12/04/2017 revealed a small focus of mild uptake (SUV 3.77; background 2.73) localizing to the caudate lobe of liver. There was no corresponding CT abnormality.  Symptomatically, he feels good.  He is eating well.  He has gained 7 pounds.  He denies any melena or hematochezia.  Exam is unremarkable.   Plan: 1.  Labs today:  CBC with diff, CMP, Mg.  2.  Discuss interval PET.  Discuss unclear significance  of uptake in caudate lobe.  Discuss consideration of liver MRI. 3.  Review films with radiology. 4.  Follow-up with Dr. Allen Norris re: post treatment EGD. 5.  Discuss plan for endoscopy in 3 months.  Typically EGDs are performed every 3-6 months x 2 years then annually x 3 years.  Chest and abdomen CT every 6-9 months x 2 years then annually x 3 years. Will discuss follow-up EGD with Dr. Allen Norris, as patient had EUS done in [place] due to scheduling conflicts.  6.  RTC in 3 months for MD assessment, labs (CBC with dif, CMP, CEA).   Lequita Asal, MD  12/12/2017, 4:35 PM

## 2017-12-16 ENCOUNTER — Telehealth: Payer: Self-pay | Admitting: Gastroenterology

## 2017-12-16 NOTE — Telephone Encounter (Signed)
Please schedule in [place]

## 2017-12-16 NOTE — Telephone Encounter (Signed)
Patient needs a EGD post esophageal cancer.

## 2017-12-17 ENCOUNTER — Other Ambulatory Visit: Payer: Self-pay

## 2017-12-17 DIAGNOSIS — C159 Malignant neoplasm of esophagus, unspecified: Secondary | ICD-10-CM

## 2017-12-17 NOTE — Telephone Encounter (Signed)
Pt scheduled for an EGD with [facility] at [facility] on 12/31/17. Instructions have been mailed.

## 2017-12-26 DIAGNOSIS — E119 Type 2 diabetes mellitus without complications: Secondary | ICD-10-CM | POA: Diagnosis not present

## 2017-12-26 LAB — HM DIABETES EYE EXAM

## 2017-12-31 ENCOUNTER — Ambulatory Visit: Payer: Medicare Other | Admitting: Anesthesiology

## 2017-12-31 ENCOUNTER — Ambulatory Visit
Admission: RE | Admit: 2017-12-31 | Discharge: 2017-12-31 | Disposition: A | Discharge status: Home / Self Care | Payer: Medicare Other | Source: Ambulatory Visit | Attending: Gastroenterology | Admitting: Gastroenterology

## 2017-12-31 ENCOUNTER — Encounter: Payer: Self-pay | Admitting: Anesthesiology

## 2017-12-31 ENCOUNTER — Encounter
Admission: RE | Disposition: A | Discharge status: Home / Self Care | Payer: Self-pay | Source: Ambulatory Visit | Attending: Gastroenterology

## 2017-12-31 ENCOUNTER — Other Ambulatory Visit: Payer: Self-pay

## 2017-12-31 DIAGNOSIS — K228 Other specified diseases of esophagus: Secondary | ICD-10-CM | POA: Insufficient documentation

## 2017-12-31 DIAGNOSIS — Z7984 Long term (current) use of oral hypoglycemic drugs: Secondary | ICD-10-CM | POA: Insufficient documentation

## 2017-12-31 DIAGNOSIS — D649 Anemia, unspecified: Secondary | ICD-10-CM | POA: Insufficient documentation

## 2017-12-31 DIAGNOSIS — Z08 Encounter for follow-up examination after completed treatment for malignant neoplasm: Secondary | ICD-10-CM | POA: Diagnosis not present

## 2017-12-31 DIAGNOSIS — C159 Malignant neoplasm of esophagus, unspecified: Secondary | ICD-10-CM

## 2017-12-31 DIAGNOSIS — Z86010 Personal history of colonic polyps: Secondary | ICD-10-CM | POA: Diagnosis not present

## 2017-12-31 DIAGNOSIS — E119 Type 2 diabetes mellitus without complications: Secondary | ICD-10-CM | POA: Diagnosis not present

## 2017-12-31 DIAGNOSIS — Z79899 Other long term (current) drug therapy: Secondary | ICD-10-CM | POA: Insufficient documentation

## 2017-12-31 DIAGNOSIS — Z8501 Personal history of malignant neoplasm of esophagus: Secondary | ICD-10-CM | POA: Diagnosis not present

## 2017-12-31 DIAGNOSIS — I10 Essential (primary) hypertension: Secondary | ICD-10-CM | POA: Diagnosis not present

## 2017-12-31 DIAGNOSIS — E039 Hypothyroidism, unspecified: Secondary | ICD-10-CM | POA: Insufficient documentation

## 2017-12-31 DIAGNOSIS — K209 Esophagitis, unspecified: Secondary | ICD-10-CM | POA: Diagnosis not present

## 2017-12-31 DIAGNOSIS — Z79890 Hormone replacement therapy (postmenopausal): Secondary | ICD-10-CM | POA: Diagnosis not present

## 2017-12-31 HISTORY — PX: ESOPHAGOGASTRODUODENOSCOPY (EGD) WITH PROPOFOL: SHX5813

## 2017-12-31 LAB — GLUCOSE, CAPILLARY: Glucose-Capillary: 110 mg/dL — ABNORMAL HIGH (ref 65–99)

## 2017-12-31 SURGERY — ESOPHAGOGASTRODUODENOSCOPY (EGD) WITH PROPOFOL
Anesthesia: General

## 2017-12-31 MED ORDER — PROPOFOL 500 MG/50ML IV EMUL
INTRAVENOUS | Status: DC | PRN
  Administered 2017-12-31: 100 ug/kg/min via INTRAVENOUS

## 2017-12-31 MED ORDER — PROPOFOL 500 MG/50ML IV EMUL
INTRAVENOUS | Status: AC
  Filled 2017-12-31: qty 50

## 2017-12-31 MED ORDER — SODIUM CHLORIDE 0.9 % IV SOLN
INTRAVENOUS | Status: DC
  Administered 2017-12-31: 11:00:00 via INTRAVENOUS

## 2017-12-31 MED ORDER — LIDOCAINE HCL (PF) 2 % IJ SOLN
INTRAMUSCULAR | Status: AC
  Filled 2017-12-31: qty 10

## 2017-12-31 MED ORDER — PROPOFOL 10 MG/ML IV BOLUS
INTRAVENOUS | Status: DC | PRN
  Administered 2017-12-31 (×4): 20 mg via INTRAVENOUS

## 2017-12-31 MED ORDER — LIDOCAINE HCL (CARDIAC) 20 MG/ML IV SOLN
INTRAVENOUS | Status: DC | PRN
  Administered 2017-12-31: 30 mg via INTRAVENOUS

## 2017-12-31 NOTE — Anesthesia Postprocedure Evaluation (Signed)
Anesthesia Post Note  Patient: Aaron Becker  Procedure(s) Performed: ESOPHAGOGASTRODUODENOSCOPY (EGD) WITH PROPOFOL (N/A )  Patient location during evaluation: Endoscopy Anesthesia Type: General Level of consciousness: awake and alert Pain management: pain level controlled Vital Signs Assessment: post-procedure vital signs reviewed and stable Respiratory status: spontaneous breathing, nonlabored ventilation, respiratory function stable and patient connected to nasal cannula oxygen Cardiovascular status: blood pressure returned to baseline and stable Postop Assessment: no apparent nausea or vomiting Anesthetic complications: no     Last Vitals:  Vitals:   12/31/17 1147 12/31/17 1157  BP: 138/75 (!) 151/87  Pulse: 70 75  Resp: 16 20  Temp:    SpO2: 99% 96%    Last Pain:  Vitals:   12/31/17 1157  TempSrc:   PainSc: 0-No pain                 Alysia Scism S

## 2017-12-31 NOTE — Anesthesia Post-op Follow-up Note (Signed)
Anesthesia QCDR form completed.        

## 2017-12-31 NOTE — H&P (Signed)
[name], MD [address], [place], [place], [place], [address] [address], [place], [place], [place], [address] Phone: [phone]  Fax: [phone]  Primary Care Physician:  Tonia Ghent, MD   Pre-Procedure History & Physical: HPI:  Aaron Becker is a 84 y.o. male is here for an endoscopy    Past Medical History:  Diagnosis Date  . Anemia   . Arthritis    hands  . Bradycardia   . Colon polyp   . Diabetes mellitus, type II ([place]) 1986   oral meds  . Elevated PSA 2011   per Dr. Jacqlyn Larsen with Uro no problems last 5 or 6 yrs  . Esophageal cancer ([place]) dx sept 18 2018  . HOH (hard of hearing)    bilateral AIDES  . Hyperlipidemia   . Hypertension 02/02   off bp meds since hospitalization 3 weeks ago  . Hypothyroidism 1980's  . Palpitations   . Right rib fracture 2015  . SVT (supraventricular tachycardia) ([place])    a. reported SVT during admission at [facility] 06/2014  . Syncope and collapse    none recent last few weeks  . Wears dentures    upper and lower    Past Surgical History:  Procedure Laterality Date  . [place], 09/2015   COMPRESSED FRACTURE SPINE  . carotid ultrasound  02/02   wnl  . CATARACT EXTRACTION W/PHACO Right 03/19/2016   Procedure: CATARACT EXTRACTION PHACO AND INTRAOCULAR LENS PLACEMENT (IOC);  Surgeon: Ronnell Freshwater, MD;  Location: [place];  Service: Ophthalmology;  Laterality: Right;  DIABETIC-oral med RIGHT  . CATARACT EXTRACTION W/PHACO Left 04/23/2016   Procedure: CATARACT EXTRACTION PHACO AND INTRAOCULAR LENS PLACEMENT (IOC);  Surgeon: Ronnell Freshwater, MD;  Location: [place];  Service: Ophthalmology;  Laterality: Left;  LEFT DIABETIC - oral meds  . CHOLECYSTECTOMY  1967  . COLONOSCOPY W/ POLYPECTOMY  2012   Villous adenoma from the rectum without high-grade dysplasia, 10 mm  . COLONOSCOPY WITH PROPOFOL N/A 03/30/2015   Procedure: COLONOSCOPY WITH PROPOFOL;  Surgeon: Robert Bellow, MD;  Location: [facility] ENDOSCOPY;  Service: Endoscopy;  Laterality: N/A;  . CYST EXCISION     thumb  . CYSTECTOMY  06/14/04   mucous cyst excision with left thumb, IP joint debridement  . ESOPHAGOGASTRODUODENOSCOPY (EGD) WITH PROPOFOL N/A 06/11/2017   Procedure: ESOPHAGOGASTRODUODENOSCOPY (EGD) WITH PROPOFOL;  Surgeon: Lucilla Lame, MD;  Location: [facility] ENDOSCOPY;  Service: Endoscopy;  Laterality: N/A;  . EUS N/A 07/04/2017   Procedure: UPPER ENDOSCOPIC ULTRASOUND (EUS) RADIAL;  Surgeon: [name], MD;  [facility];  Service: Endoscopy;  Laterality: N/A;  . FLEXIBLE SIGMOIDOSCOPY N/A 06/11/2017   Procedure: FLEXIBLE SIGMOIDOSCOPY;  Surgeon: Lucilla Lame, MD;  Location: [facility] ENDOSCOPY;  Service: Endoscopy;  Laterality: N/A;  . HERNIA REPAIR  2013  . KYPHOPLASTY N/A 10/06/2015   Procedure: Thoracic twelve kyphoplasty;  Surgeon: Karie Chimera, MD;  Location: [place] NEURO ORS;  Service: Neurosurgery;  Laterality: N/A;  T12 Kyphoplasty  . PROSTATE BIOPSY  11/01/08   Dr. Reece Agar  . [place]  . VASECTOMY  1975    Prior to Admission medications   Medication Sig Start Date End Date Taking? Authorizing Provider  acetaminophen (TYLENOL) 500 MG tablet Take 500-1,000 mg by mouth every 6 (six) hours as needed (for pain.).   Yes [provider]  Ascorbic Acid (VITAMIN C) 1000 MG tablet Take 1,000 mg by mouth  daily.   Yes [provider]  benzonatate (TESSALON) 100 MG capsule Take 100 mg by mouth at bedtime as needed for cough.   Yes [provider]  Chlorpheniramine-DM (CORICIDIN HBP COUGH/COLD PO) Take by mouth 4 (four) times daily as needed.   Yes [provider]  diphenhydrAMINE (BENADRYL) 25 mg capsule Take 25 mg by mouth at bedtime as needed (for sleep.).    Yes [provider]  docusate sodium (COLACE) 100 MG capsule Take 100 mg by mouth 2 (two) times daily as needed (for constipation).   Yes [provider]    ferrous sulfate 325 (65 FE) MG tablet Take 1 tablet (325 mg total) by mouth daily with breakfast. 07/05/17  Yes Tonia Ghent, MD  glimepiride (AMARYL) 1 MG tablet Take 2 tablets (2 mg total) by mouth daily with breakfast. 02/11/17  Yes Tonia Ghent, MD  glucose blood (ONE TOUCH ULTRA TEST) test strip TO TEST BLOOD SUGAR ONCE OR TWICE DAILY AND AS DIRECTED DX E11.9.  H/o variable blood sugars. Patient taking differently: 1 each by Other route 2 (two) times daily. TO TEST BLOOD SUGAR ONCE OR TWICE DAILY AND AS DIRECTED DX E11.9.  H/o variable blood sugars. 06/26/17  Yes Tonia Ghent, MD  levothyroxine (SYNTHROID, LEVOTHROID) 75 MCG tablet Take 1 tablet (75 mcg total) by mouth daily. Patient taking differently: Take 75 mcg by mouth daily before breakfast.  02/11/17  Yes Tonia Ghent, MD  metFORMIN (GLUCOPHAGE) 500 MG tablet Take 1-2 tablets (500-1,000 mg total) by mouth 2 (two) times daily with a meal. 500 mg every morning and 1000 mg daily with supper (may take 1 extra tablet at night if needed) 06/16/17  Yes Mody, Sital, MD  Multiple Vitamin (MULTIVITAMIN WITH MINERALS) TABS tablet Take 1 tablet by mouth daily. Centrum Silver   Yes [provider]  pantoprazole (PROTONIX) 40 MG tablet Take 1 tablet (40 mg total) by mouth 2 (two) times daily. 10/10/17 10/10/18 Yes Karen Kitchens, NP  pravastatin (PRAVACHOL) 80 MG tablet Take 1 tablet (80 mg total) by mouth at bedtime. 06/16/17  Yes Bettey Costa, MD  pyridOXINE (VITAMIN B-6) 100 MG tablet Take 100 mg by mouth 3 (three) times a week.    Yes [provider]  folic acid (FOLVITE) 403 MCG tablet Take 400 mcg by mouth daily with lunch.     [provider]    Allergies as of 12/17/2017 - Review Complete 12/12/2017  Allergen Reaction Noted  . Flomax [tamsulosin hcl] Other (See Comments) 07/26/2014  . Ibuprofen Other (See Comments) 02/02/2015  . Rosuvastatin  01/23/2007  . Sulfa antibiotics Rash 07/07/2014  . Sulfonamide  derivatives Rash 01/23/2007    Family History  Problem Relation Age of Onset  . Cancer Father        liver  . Cancer Sister        breast  . Cancer Brother        skin  . Cancer Sister        breast  . Heart disease Sister        pacer  . Heart disease Sister   . Cancer Sister        breast cancer  . Heart disease Sister   . Colon cancer Neg Hx   . Prostate cancer Neg Hx     Social History   Socioeconomic History  . Marital status: Widowed    Spouse name: Not on file  . Number of children:  Not on file  . Years of education: Not on file  . Highest education level: Not on file  Occupational History  . Occupation: Pharmacist, community: retired    Comment: Retired 1999  Social Needs  . Financial resource strain: Not on file  . Food insecurity:    Worry: Not on file    Inability: Not on file  . Transportation needs:    Medical: Not on file    Non-medical: Not on file  Tobacco Use  . Smoking status: Never Smoker  . Smokeless tobacco: Never Used  Substance and Sexual Activity  . Alcohol use: No    Alcohol/week: 0.0 oz  . Drug use: No  . Sexual activity: Never  Lifestyle  . Physical activity:    Days per week: Not on file    Minutes per session: Not on file  . Stress: Not on file  Relationships  . Social connections:    Talks on phone: Not on file    Gets together: Not on file    Attends religious service: Not on file    Active member of club or organization: Not on file    Attends meetings of clubs or organizations: Not on file    Relationship status: Not on file  . Intimate partner violence:    Fear of current or ex partner: Not on file    Emotionally abused: Not on file    Physically abused: Not on file    Forced sexual activity: Not on file  Other Topics Concern  . Not on file  Social History Narrative   Mar[place]- widower 2000 ([place]), not marred but with partner since 2001.    Retired from [employer].     [employer] '[employer],  stationed in [place]: See HPI, otherwise negative ROS  Physical Exam: BP (!) 179/83   Pulse 81   Temp (!) 97.5 F (36.4 C) (Tympanic)   Resp 16   Ht 5\' 8"  (1.727 m)   Wt 149 lb (67.6 kg)   SpO2 100%   BMI 22.66 kg/m  General:   Alert,  pleasant and cooperative in NAD Head:  Normocephalic and atraumatic. Neck:  Supple; no masses or thyromegaly. Lungs:  Clear throughout to auscultation, normal respiratory effort.    Heart:  +S1, +S2, Regular rate and rhythm, No edema. Abdomen:  Soft, nontender and nondistended. Normal bowel sounds, without guarding, and without rebound.   Neurologic:  Alert and  oriented x4;  grossly normal neurologically.  Impression/Plan: Aaron Becker is here for an endoscopy  to be performed for  evaluation of esophageal cancer    Risks, benefits, limitations, and alternatives regarding endoscopy have been reviewed with the patient.  Questions have been answered.  All parties agreeable.   [name], MD  12/31/2017, 11:23 AM

## 2017-12-31 NOTE — Transfer of Care (Signed)
Immediate Anesthesia Transfer of Care Note  Patient: Aaron Becker  Procedure(s) Performed: ESOPHAGOGASTRODUODENOSCOPY (EGD) WITH PROPOFOL (N/A )  Patient Location: PACU  Anesthesia Type:MAC  Level of Consciousness: awake  Airway & Oxygen Therapy: Patient Spontanous Breathing  Post-op Assessment: Report given to RN  Post vital signs: stable  Last Vitals:  Vitals Value Taken Time  BP    Temp    Pulse    Resp    SpO2      Last Pain:  Vitals:   12/31/17 1056  TempSrc: Tympanic  PainSc: 0-No pain         Complications: No apparent anesthesia complications

## 2017-12-31 NOTE — Anesthesia Preprocedure Evaluation (Addendum)
Anesthesia Evaluation  Patient identified by MRN, date of birth, ID band Patient awake    Reviewed: Allergy & Precautions, NPO status , Patient's Chart, lab work & pertinent test results, reviewed documented beta blocker date and time   Airway Mallampati: II  TM Distance: >3 FB     Dental  (+) Upper Dentures, Lower Dentures   Pulmonary           Cardiovascular hypertension, Pt. on medications      Neuro/Psych    GI/Hepatic   Endo/Other  diabetes, Type 2Hypothyroidism   Renal/GU      Musculoskeletal  (+) Arthritis ,   Abdominal   Peds  Hematology  (+) anemia ,   Anesthesia Other Findings Esophageal ca.  Reproductive/Obstetrics                            Anesthesia Physical Anesthesia Plan  ASA: III  Anesthesia Plan: General   Post-op Pain Management:    Induction: Intravenous  PONV Risk Score and Plan:   Airway Management Planned:   Additional Equipment:   Intra-op Plan:   Post-operative Plan:   Informed Consent: I have reviewed the patients History and Physical, chart, labs and discussed the procedure including the risks, benefits and alternatives for the proposed anesthesia with the patient or authorized representative who has indicated his/her understanding and acceptance.     Plan Discussed with: CRNA  Anesthesia Plan Comments:         Anesthesia Quick Evaluation

## 2017-12-31 NOTE — Op Note (Signed)
[facility] Patient Name: Aaron Becker Procedure Date: 12/31/2017 11:24 AM MRN: [id number] Account #: [id number] Date of Birth: 1930-09-13 Admit Type: Outpatient Age: 84 Room: [facility] ENDO ROOM 4 Gender: Male Note Status: Finalized Procedure:            Upper GI endoscopy Indications:          Follow-up of malignant esophageal tumor Providers:            [name] MD, MD Referring MD:         Elveria Rising. Damita Dunnings, MD (Referring MD) Medicines:            Monitored Anesthesia Care Complications:        No immediate complications. Procedure:            Pre-Anesthesia Assessment:                       - Prior to the procedure, a History and Physical was                        performed, and patient medications, allergies and                        sensitivities were reviewed. The patient's tolerance of                        previous anesthesia was reviewed.                       - The risks and benefits of the procedure and the                        sedation options and risks were discussed with the                        patient. All questions were answered and informed                        consent was obtained.                       - ASA Grade Assessment: III - A patient with severe                        systemic disease.                       After obtaining informed consent, the endoscope was                        passed under direct vision. Throughout the procedure,                        the patient's blood pressure, pulse, and oxygen                        saturations were monitored continuously. The Endoscope                        was introduced through the mouth, and advanced to the  third part of duodenum. The upper GI endoscopy was                        accomplished with ease. The patient tolerated the                        procedure well. Findings:      The examined duodenum was normal.      The stomach was  normal.      The cardia and gastric fundus were normal on retroflexion.      Localized mild mucosal changes characterized by erythema were found at       the gastroesophageal junction. Biopsies were taken with a cold forceps       for histology. In comaprison to last EGD the findings today were very       minimal and except for very mild erythema at the GE junction appeared       normal. Impression:           - Normal examined duodenum.                       - Normal stomach.                       - Erythematous mucosa in the esophagus. Biopsied. Recommendation:       - Discharge patient to home.                       - Resume previous diet.                       - Continue present medications.                       - Await pathology results.                       - Return to GI office PRN. Procedure Code(s):    --- Professional ---                       [phone], Esophagogastroduodenoscopy, flexible, transoral;                        with biopsy, single or multiple Diagnosis Code(s):    --- Professional ---                       K22.8, Other specified diseases of esophagus                       C15.9, Malignant neoplasm of esophagus, unspecified CPT copyright 2017 American Medical Association. All rights reserved. The codes documented in this report are preliminary and upon coder review may  be revised to meet current compliance requirements. [name], MD [name] MD, MD 12/31/2017 11:35:18 AM This report has been signed electronically. Number of Addenda: 0 Note Initiated On: 12/31/2017 11:24 AM      [facility]

## 2018-01-02 ENCOUNTER — Encounter: Payer: Self-pay | Admitting: Gastroenterology

## 2018-01-02 DIAGNOSIS — E119 Type 2 diabetes mellitus without complications: Secondary | ICD-10-CM | POA: Diagnosis not present

## 2018-01-02 LAB — SURGICAL PATHOLOGY

## 2018-01-03 ENCOUNTER — Telehealth: Payer: Self-pay

## 2018-01-03 NOTE — Telephone Encounter (Signed)
-----   Message from Lucilla Lame, MD sent at 01/02/2018  6:54 PM EDT ----- Let the patient know that the biopsies did not show any cancerous tissue in the esophagus.

## 2018-01-03 NOTE — Telephone Encounter (Signed)
Pt notified of pathology results

## 2018-01-05 ENCOUNTER — Encounter: Payer: Self-pay | Admitting: Gastroenterology

## 2018-01-14 ENCOUNTER — Ambulatory Visit: Payer: Medicare Other | Admitting: Family Medicine

## 2018-01-15 ENCOUNTER — Ambulatory Visit: Payer: Medicare Other | Admitting: Family Medicine

## 2018-01-15 ENCOUNTER — Encounter: Payer: Self-pay | Admitting: Family Medicine

## 2018-01-15 VITALS — BP 150/64 | HR 78 | Temp 98.5°F | Ht 65.75 in | Wt 152.0 lb

## 2018-01-15 DIAGNOSIS — Z7189 Other specified counseling: Secondary | ICD-10-CM

## 2018-01-15 DIAGNOSIS — Z66 Do not resuscitate: Secondary | ICD-10-CM | POA: Diagnosis not present

## 2018-01-15 DIAGNOSIS — E119 Type 2 diabetes mellitus without complications: Secondary | ICD-10-CM

## 2018-01-15 DIAGNOSIS — C159 Malignant neoplasm of esophagus, unspecified: Secondary | ICD-10-CM

## 2018-01-15 DIAGNOSIS — R5383 Other fatigue: Secondary | ICD-10-CM | POA: Diagnosis not present

## 2018-01-15 MED ORDER — LEVOTHYROXINE SODIUM 75 MCG PO TABS: 75.0000 ug | ORAL_TABLET | Freq: Every day | ORAL | Status: DC

## 2018-01-15 MED ORDER — METFORMIN HCL 500 MG PO TABS: ORAL_TABLET | ORAL | Status: DC

## 2018-01-15 NOTE — Progress Notes (Signed)
H/o esophageal cancer s/p treatment with neg bx on follow up EGD.  His weight is up and that is good, d/w pt. Appetite is good.  Had been off iron, med list updated.  Fatigue noted even with good sleep.  He is likely relatively deconditioned from the treatment, d/w pt.    DM2.  Taking 2 glimepiride in the AM.  1 metformin in the AM, 2 in the PM.  Sugars have been "not too high" in the AMs.  Usually ~140s-160s in the AMs.  He had 1 episode of sugar up to 300.    Advance directive d/w pt. His daughter [name] designated if patient were incapacitated.  If she were unavailable, then his son Orpah Greek would be designated next.   His daughter has breast cancer recently diagnosed with a lumpectomy pending.  D/w pt.    He wants a DNR.  Done at [place].  Discussed in detail with the patient.  If he had cardiac arrest he absolutely would not want CPR done under any circumstances.  He said "if my heart stops leave me in peace."  PMH and SH reviewed  ROS: Per HPI unless specifically indicated in ROS section   Meds, vitals, and allergies reviewed.   GEN: nad, alert and oriented HEENT: mucous membranes moist NECK: supple w/o LA CV: rrr PULM: ctab, no inc wob ABD: soft, +bs EXT: no edema SKIN: no acute rash Gait is slow but symmetric.

## 2018-01-15 NOTE — Assessment & Plan Note (Signed)
Advance directive d/w pt. His daughter [name] designated if patient were incapacitated.  If she were unavailable, then his son Orpah Greek would be designated next.

## 2018-01-15 NOTE — Patient Instructions (Signed)
Go to the lab on the way out.  We'll contact you with your lab report. You don't need to restart blood pressure medicine at this point.  Don't change your meds for now.  Take care.  Glad to see you.  Thank you for your effort.

## 2018-01-16 LAB — TSH: TSH: 0.83 u[IU]/mL (ref 0.35–4.50)

## 2018-01-16 LAB — CBC WITH DIFFERENTIAL/PLATELET
BASOS PCT: 1 % (ref 0.0–3.0)
Basophils Absolute: 0.1 10*3/uL (ref 0.0–0.1)
EOS ABS: 0.1 10*3/uL (ref 0.0–0.7)
Eosinophils Relative: 0.7 % (ref 0.0–5.0)
HEMATOCRIT: 35 % — AB (ref 39.0–52.0)
Hemoglobin: 11.4 g/dL — ABNORMAL LOW (ref 13.0–17.0)
LYMPHS PCT: 6.7 % — AB (ref 12.0–46.0)
Lymphs Abs: 0.5 10*3/uL — ABNORMAL LOW (ref 0.7–4.0)
MCHC: 32.7 g/dL (ref 30.0–36.0)
MCV: 88.2 fl (ref 78.0–100.0)
MONOS PCT: 8.9 % (ref 3.0–12.0)
Monocytes Absolute: 0.7 10*3/uL (ref 0.1–1.0)
NEUTROS ABS: 6.1 10*3/uL (ref 1.4–7.7)
Neutrophils Relative %: 82.7 % — ABNORMAL HIGH (ref 43.0–77.0)
PLATELETS: 196 10*3/uL (ref 150.0–400.0)
RBC: 3.96 Mil/uL — ABNORMAL LOW (ref 4.22–5.81)
RDW: 13.7 % (ref 11.5–15.5)
WBC: 7.4 10*3/uL (ref 4.0–10.5)

## 2018-01-16 LAB — COMPREHENSIVE METABOLIC PANEL
ALT: 20 U/L (ref 0–53)
AST: 21 U/L (ref 0–37)
Albumin: 3.8 g/dL (ref 3.5–5.2)
Alkaline Phosphatase: 62 U/L (ref 39–117)
BUN: 22 mg/dL (ref 6–23)
CALCIUM: 9.6 mg/dL (ref 8.4–10.5)
CHLORIDE: 104 meq/L (ref 96–112)
CO2: 28 meq/L (ref 19–32)
Creatinine, Ser: 1.33 mg/dL (ref 0.40–1.50)
GFR: 54.24 mL/min — ABNORMAL LOW (ref >60.00)
Glucose, Bld: 198 mg/dL — ABNORMAL HIGH (ref 70–99)
Potassium: 4.4 mEq/L (ref 3.5–5.1)
Sodium: 139 mEq/L (ref 135–145)
Total Bilirubin: 0.5 mg/dL (ref 0.2–1.2)
Total Protein: 6.9 g/dL (ref 6.0–8.3)

## 2018-01-16 LAB — HEMOGLOBIN A1C: Hgb A1c MFr Bld: 7.7 % — ABNORMAL HIGH (ref 4.6–6.5)

## 2018-01-16 NOTE — Assessment & Plan Note (Signed)
Status post treatment with recent negative biopsy on EGD.  He will still have routine follow-up with the oncology and GI clinic.  He is eating well.  His weight is back up.  He is still relatively deconditioned and he has significant fatigue.  Recheck routine labs today.  See notes on labs.  At this point still okay for outpatient follow-up.

## 2018-01-16 NOTE — Assessment & Plan Note (Signed)
He wants a DNR.  Done at [place].  Discussed in detail with the patient.  If he had cardiac arrest he absolutely would not want CPR done under any circumstances.  He said "if my heart stops leave me in peace."

## 2018-01-16 NOTE — Assessment & Plan Note (Signed)
No change in meds at this point.  Recheck routine labs today.  See notes on labs. >25 minutes spent in face to face time with patient, >50% spent in counselling or coordination of care, discussing DNR status, diabetes, esophageal cancer treatment, etc.

## 2018-01-21 ENCOUNTER — Other Ambulatory Visit: Payer: Self-pay | Admitting: Family Medicine

## 2018-01-21 DIAGNOSIS — R269 Unspecified abnormalities of gait and mobility: Secondary | ICD-10-CM

## 2018-01-25 DIAGNOSIS — Z9181 History of falling: Secondary | ICD-10-CM | POA: Diagnosis not present

## 2018-01-25 DIAGNOSIS — Z7984 Long term (current) use of oral hypoglycemic drugs: Secondary | ICD-10-CM | POA: Diagnosis not present

## 2018-01-25 DIAGNOSIS — C159 Malignant neoplasm of esophagus, unspecified: Secondary | ICD-10-CM | POA: Diagnosis not present

## 2018-01-25 DIAGNOSIS — E119 Type 2 diabetes mellitus without complications: Secondary | ICD-10-CM | POA: Diagnosis not present

## 2018-01-27 ENCOUNTER — Telehealth: Payer: Self-pay | Admitting: Family Medicine

## 2018-01-27 NOTE — Telephone Encounter (Signed)
Left detailed message on voicemail of Aaron Becker from [facility].

## 2018-01-27 NOTE — Telephone Encounter (Signed)
Please give the order.  Thanks.   

## 2018-01-27 NOTE — Telephone Encounter (Signed)
Copied from [place] [phone]. Topic: Quick Communication - See Telephone Encounter >> Jan 27, 2018 11:35 AM Aurelio Brash B wrote: CRM for notification. See Telephone encounter for: 01/27/18.  Wes is asking for PT orders to see pt  twice a week   for 8 weeks    [phone]

## 2018-01-30 ENCOUNTER — Telehealth: Payer: Self-pay | Admitting: Family Medicine

## 2018-01-30 NOTE — Telephone Encounter (Signed)
Left detailed message on voicemail of Aaron Becker from [facility].

## 2018-01-30 NOTE — Telephone Encounter (Signed)
Likely relatively deconditioned from h/o esophageal cancer s/p treatment with neg bx on follow up EGD.  Thanks.

## 2018-01-30 NOTE — Telephone Encounter (Signed)
Pt was seen 01/15/18.Please advise.

## 2018-01-30 NOTE — Telephone Encounter (Signed)
Copied from [place] [phone]. Topic: Quick Communication - See Telephone Encounter >> Jan 30, 2018 10:41 AM Ivar Drape wrote: CRM for notification. See Telephone encounter for: 01/30/18. Wes a Physical Therapist w/[facility] [phone] stated the patient has been diagnosed with fatigue.  They are wanting to know if this is secondary to a more serious condition.

## 2018-02-08 DIAGNOSIS — C159 Malignant neoplasm of esophagus, unspecified: Secondary | ICD-10-CM | POA: Diagnosis not present

## 2018-02-08 DIAGNOSIS — Z9181 History of falling: Secondary | ICD-10-CM | POA: Diagnosis not present

## 2018-02-08 DIAGNOSIS — E119 Type 2 diabetes mellitus without complications: Secondary | ICD-10-CM | POA: Diagnosis not present

## 2018-02-08 DIAGNOSIS — Z7984 Long term (current) use of oral hypoglycemic drugs: Secondary | ICD-10-CM | POA: Diagnosis not present

## 2018-02-10 ENCOUNTER — Other Ambulatory Visit: Payer: Self-pay | Admitting: Family Medicine

## 2018-02-10 NOTE — Telephone Encounter (Signed)
Copied from [place] [phone]. Topic: Quick Communication - Rx Refill/Question >> Feb 10, 2018 10:01 AM Cleaster Corin, NT wrote: Medication:pantoprazole ([place]) 40 MG tablet [[id number]]   Has the patient contacted their pharmacy? yes (Agent: If no, request that the patient contact the pharmacy for the refill.) (Agent: If yes, when and what did the pharmacy advise?)  Preferred Pharmacy (with phone number or street name): [id number] - [place] [address], [place] [address] [place] [address] [place] [address] Phone: [phone] Fax: [phone]    Agent: Please be advised that RX refills may take up to 3 business days. We ask that you follow-up with your pharmacy.

## 2018-02-10 NOTE — Telephone Encounter (Signed)
Called  And  Left  Message on patients  Home  And  Voice mail mail that  Protonix refill request was written by his oncologist  Honor Loh Np  The pharmacy [facility] on [address] also reports patient has a  Refill and he only needs to call in

## 2018-02-13 ENCOUNTER — Inpatient Hospital Stay: Payer: Medicare Other | Attending: Radiation Oncology

## 2018-02-13 ENCOUNTER — Other Ambulatory Visit: Payer: Self-pay

## 2018-02-13 ENCOUNTER — Encounter: Payer: Self-pay | Admitting: Radiation Oncology

## 2018-02-13 ENCOUNTER — Ambulatory Visit
Admission: RE | Admit: 2018-02-13 | Discharge: 2018-02-13 | Disposition: A | Discharge status: Home / Self Care | Payer: Medicare Other | Source: Ambulatory Visit | Attending: Radiation Oncology | Admitting: Radiation Oncology

## 2018-02-13 VITALS — BP 148/64 | Wt 150.4 lb

## 2018-02-13 DIAGNOSIS — C155 Malignant neoplasm of lower third of esophagus: Secondary | ICD-10-CM | POA: Diagnosis not present

## 2018-02-13 DIAGNOSIS — Z9221 Personal history of antineoplastic chemotherapy: Secondary | ICD-10-CM | POA: Diagnosis not present

## 2018-02-13 DIAGNOSIS — K769 Liver disease, unspecified: Secondary | ICD-10-CM | POA: Diagnosis not present

## 2018-02-13 DIAGNOSIS — C159 Malignant neoplasm of esophagus, unspecified: Secondary | ICD-10-CM

## 2018-02-13 DIAGNOSIS — Z923 Personal history of irradiation: Secondary | ICD-10-CM | POA: Insufficient documentation

## 2018-02-13 DIAGNOSIS — Z8501 Personal history of malignant neoplasm of esophagus: Secondary | ICD-10-CM | POA: Insufficient documentation

## 2018-02-13 NOTE — Progress Notes (Signed)
Radiation Oncology Follow up Note  Name: Aaron Becker   Date:   02/13/2018 MRN:  [id number] DOB: [birth year]    This 84 y.o. male presents to the clinic today for six-month follow-up status post concurrent chemoradiation therapy for stage IIB adenocarcinoma the distal esophagus.  REFERRING PROVIDER: Tonia Ghent, MD  HPI: patient is an 84 year old male now out 6 months having completed concurrent chemoradiation for stage IIa (T2 N0) adenocarcinoma the distal esophagus. He is seen today in routine follow-up and is doing well. He specifically denies dysphagia cough or bone pain..he had a repeat PET CT scan back in March which I have reviewed showing no evidence of disease except for a small focus of mild uptake in the caudate lobe of the liver. There was no CT corresponding abnormality. Patient also had repeat upper endoscopy with biopsy negative for malignancy.  COMPLICATIONS OF TREATMENT: none  FOLLOW UP COMPLIANCE: keeps appointments   PHYSICAL EXAM:  BP (!) 148/64   Wt 150 lb 5.7 oz (68.2 kg)   BMI 24.45 kg/m  Well-developed well-nourished patient in NAD. HEENT reveals PERLA, EOMI, discs not visualized.  Oral cavity is clear. No oral mucosal lesions are identified. Neck is clear without evidence of cervical or supraclavicular adenopathy. Lungs are clear to A&P. Cardiac examination is essentially unremarkable with regular rate and rhythm without murmur rub or thrill. Abdomen is benign with no organomegaly or masses noted. Motor sensory and DTR levels are equal and symmetric in the upper and lower extremities. Cranial nerves II through XII are grossly intact. Proprioception is intact. No peripheral adenopathy or edema is identified. No motor or sensory levels are noted. Crude visual fields are within normal range.  RADIOLOGY RESULTS: PET CT scan is reviewed and compatible with the above-stated findings  PLAN: present time patient is doing well with no evidence of disease. I'm please was  overall progress. Both by PET and upper endoscopy criteria he is disease-free. I've asked to see him back in 6 months for follow-up. Patient is to call with any concerns.  I would like to take this opportunity to thank you for allowing me to participate in the care of your patient.Noreene Filbert, MD

## 2018-02-13 NOTE — Progress Notes (Unsigned)
Survivorship Care Plan visit completed.  Treatment summary reviewed and given to patient.  ASCO answers booklet reviewed and given to patient.  CARE program and Cancer Transitions discussed with patient along with other resources cancer center offers to patients and caregivers.  Patient verbalized understanding.    

## 2018-02-14 ENCOUNTER — Telehealth: Payer: Self-pay | Admitting: Family Medicine

## 2018-02-14 NOTE — Telephone Encounter (Signed)
Copied from [place] [phone]. Topic: Quick Communication - Rx Refill/Question >> Feb 14, 2018  9:43 AM Synthia Innocent wrote: Medication: levothyroxine (SYNTHROID, LEVOTHROID) 75 MCG tablet   Has the patient contacted their pharmacy? Yes.  Was told to contact us (Agent: If no, request that the patient contact the pharmacy for the refill.) (Agent: If yes, when and what did the pharmacy advise?)  Preferred Pharmacy (with phone number or street name): [facility] [address]: Please be advised that RX refills may take up to 3 business days. We ask that you follow-up with your pharmacy.

## 2018-02-14 NOTE — Telephone Encounter (Signed)
This is not a patient of [facility] at [facility]

## 2018-02-16 ENCOUNTER — Other Ambulatory Visit: Payer: Self-pay | Admitting: Family Medicine

## 2018-02-18 ENCOUNTER — Other Ambulatory Visit: Payer: Self-pay | Admitting: Family Medicine

## 2018-02-18 MED ORDER — LEVOTHYROXINE SODIUM 75 MCG PO TABS: 75.0000 ug | ORAL_TABLET | Freq: Every day | ORAL | Status: DC

## 2018-02-20 MED ORDER — LEVOTHYROXINE SODIUM 75 MCG PO TABS: 75.0000 ug | ORAL_TABLET | Freq: Every day | ORAL | 1 refills | Status: DC

## 2018-02-20 NOTE — Telephone Encounter (Signed)
Aaron Becker states that she confirmed with pharmacy they did not received RX 02/18/18 for levothyroxine. RX shows print not that it was escribed. Please resend  [facility] [name], [name] [phone] (Phone) [phone] (Fax)

## 2018-02-20 NOTE — Telephone Encounter (Signed)
Refill of medication sent electronically to requested pharmacy.

## 2018-02-20 NOTE — Addendum Note (Signed)
Addended by: Dimple Nanas on: 02/20/2018 11:45 AM   Modules accepted: Orders

## 2018-03-13 ENCOUNTER — Encounter: Payer: Self-pay | Admitting: Hematology and Oncology

## 2018-03-13 ENCOUNTER — Other Ambulatory Visit: Payer: Self-pay | Admitting: *Deleted

## 2018-03-13 ENCOUNTER — Inpatient Hospital Stay: Payer: Medicare Other | Attending: Hematology and Oncology

## 2018-03-13 ENCOUNTER — Inpatient Hospital Stay (HOSPITAL_BASED_OUTPATIENT_CLINIC_OR_DEPARTMENT_OTHER): Payer: Medicare Other | Admitting: Hematology and Oncology

## 2018-03-13 VITALS — BP 149/82 | HR 78 | Temp 98.2°F | Resp 18 | Wt 152.2 lb

## 2018-03-13 DIAGNOSIS — C159 Malignant neoplasm of esophagus, unspecified: Secondary | ICD-10-CM

## 2018-03-13 DIAGNOSIS — Z7984 Long term (current) use of oral hypoglycemic drugs: Secondary | ICD-10-CM | POA: Diagnosis not present

## 2018-03-13 DIAGNOSIS — C160 Malignant neoplasm of cardia: Secondary | ICD-10-CM | POA: Diagnosis not present

## 2018-03-13 DIAGNOSIS — Z9221 Personal history of antineoplastic chemotherapy: Secondary | ICD-10-CM | POA: Diagnosis not present

## 2018-03-13 DIAGNOSIS — E039 Hypothyroidism, unspecified: Secondary | ICD-10-CM | POA: Insufficient documentation

## 2018-03-13 DIAGNOSIS — E785 Hyperlipidemia, unspecified: Secondary | ICD-10-CM | POA: Diagnosis not present

## 2018-03-13 DIAGNOSIS — E119 Type 2 diabetes mellitus without complications: Secondary | ICD-10-CM | POA: Insufficient documentation

## 2018-03-13 DIAGNOSIS — Z800 Family history of malignant neoplasm of digestive organs: Secondary | ICD-10-CM | POA: Insufficient documentation

## 2018-03-13 DIAGNOSIS — Z923 Personal history of irradiation: Secondary | ICD-10-CM | POA: Insufficient documentation

## 2018-03-13 DIAGNOSIS — Z808 Family history of malignant neoplasm of other organs or systems: Secondary | ICD-10-CM | POA: Insufficient documentation

## 2018-03-13 DIAGNOSIS — Z7189 Other specified counseling: Secondary | ICD-10-CM

## 2018-03-13 DIAGNOSIS — Z79899 Other long term (current) drug therapy: Secondary | ICD-10-CM | POA: Insufficient documentation

## 2018-03-13 DIAGNOSIS — I10 Essential (primary) hypertension: Secondary | ICD-10-CM | POA: Insufficient documentation

## 2018-03-13 DIAGNOSIS — Z803 Family history of malignant neoplasm of breast: Secondary | ICD-10-CM | POA: Insufficient documentation

## 2018-03-13 LAB — COMPREHENSIVE METABOLIC PANEL
ALT: 26 U/L (ref 17–63)
AST: 32 U/L (ref 15–41)
Albumin: 3.8 g/dL (ref 3.5–5.0)
Alkaline Phosphatase: 64 U/L (ref 38–126)
Anion gap: 9 (ref 5–15)
BUN: 20 mg/dL (ref 6–20)
CO2: 24 mmol/L (ref 22–32)
Calcium: 9.7 mg/dL (ref 8.9–10.3)
Chloride: 105 mmol/L (ref 101–111)
Creatinine, Ser: 1.4 mg/dL — ABNORMAL HIGH (ref 0.61–1.24)
GFR calc Af Amer: 51 mL/min — ABNORMAL LOW (ref >60)
GFR calc non Af Amer: 44 mL/min — ABNORMAL LOW (ref >60)
Glucose, Bld: 155 mg/dL — ABNORMAL HIGH (ref 65–99)
Potassium: 4.3 mmol/L (ref 3.5–5.1)
Sodium: 138 mmol/L (ref 135–145)
Total Bilirubin: 0.8 mg/dL (ref 0.3–1.2)
Total Protein: 7 g/dL (ref 6.5–8.1)

## 2018-03-13 LAB — CBC WITH DIFFERENTIAL/PLATELET
Basophils Absolute: 0.1 10*3/uL (ref 0–0.1)
Basophils Relative: 1 %
Eosinophils Absolute: 0.1 10*3/uL (ref 0–0.7)
Eosinophils Relative: 1 %
HCT: 34.2 % — ABNORMAL LOW (ref 40.0–52.0)
Hemoglobin: 11.6 g/dL — ABNORMAL LOW (ref 13.0–18.0)
Lymphocytes Relative: 6 %
Lymphs Abs: 0.4 10*3/uL — ABNORMAL LOW (ref 1.0–3.6)
MCH: 29.2 pg (ref 26.0–34.0)
MCHC: 33.8 g/dL (ref 32.0–36.0)
MCV: 86.6 fL (ref 80.0–100.0)
Monocytes Absolute: 0.5 10*3/uL (ref 0.2–1.0)
Monocytes Relative: 8 %
Neutro Abs: 6.1 10*3/uL (ref 1.4–6.5)
Neutrophils Relative %: 84 %
Platelets: 192 10*3/uL (ref 150–440)
RBC: 3.95 MIL/uL — ABNORMAL LOW (ref 4.40–5.90)
RDW: 15.7 % — ABNORMAL HIGH (ref 11.5–14.5)
WBC: 7.2 10*3/uL (ref 3.8–10.6)

## 2018-03-13 MED ORDER — PANTOPRAZOLE SODIUM 40 MG PO TBEC: 40.0000 mg | DELAYED_RELEASE_TABLET | Freq: Two times a day (BID) | ORAL | 3 refills | Status: AC

## 2018-03-13 NOTE — Progress Notes (Signed)
Pt in for follow up, denies any difficulties or concerns.  

## 2018-03-13 NOTE — Progress Notes (Addendum)
[facility] day:  03/13/2018   Chief Complaint: Aaron Becker is a 84 y.o. male with stage IIB esophageal carcinoma who is seen for 3 month assessment.  HPI:  The patient was last seen in the medical  oncology clinic on 12/12/2017.   At that time, he felt good.  He was eating well.  He had gained 7 pounds.  He denied any melena or hematochezia.  Exam was unremarkable.   Upper endoscopy on 12/31/2017 by Dr. [name] revealed erythematous mucosa in the esophagus.  Biopsy revealed no malignancy.  There was minimal chronic inflammation in gastric mucosa and glycogenic acanthosis in squamous mucosa.  Stomach and duodenum were normal.  During the interim, patient notes that he has been doing "good". Patient is basically back to his PLOF. He is working with him physical therapy twice a week to help with his strength and endurance. He enjoys working with therapy.   Patient denies dysphagia. Patient denies that he has experienced any B symptoms. He denies any interval infections. Patient maintains an adequate appetite, and notes that he is eating well. Weight, compared to his last visit to the clinic, has increased by 3 pounds.   Patient denies pain in the clinic today.   Past Medical History:  Diagnosis Date  . Anemia   . Arthritis    hands  . Bradycardia   . Colon polyp   . Diabetes mellitus, type II ([place]) 1986   oral meds  . Elevated PSA 2011   per Dr. Jacqlyn Larsen with Uro no problems last 5 or 6 yrs  . Esophageal cancer ([place]) dx sept 18 2018  . HOH (hard of hearing)    bilateral AIDES  . Hyperlipidemia   . Hypertension 02/02   off bp meds since hospitalization 3 weeks ago  . Hypothyroidism 1980's  . Palpitations   . Right rib fracture 2015  . SVT (supraventricular tachycardia) ([place])    a. reported SVT during admission at [facility] 06/2014  . Syncope and collapse    none recent last few weeks  . Wears dentures    upper and lower    Past  Surgical History:  Procedure Laterality Date  . [place], 09/2015   COMPRESSED FRACTURE SPINE  . carotid ultrasound  02/02   wnl  . CATARACT EXTRACTION W/PHACO Right 03/19/2016   Procedure: CATARACT EXTRACTION PHACO AND INTRAOCULAR LENS PLACEMENT (IOC);  Surgeon: Ronnell Freshwater, MD;  Location: [place];  Service: Ophthalmology;  Laterality: Right;  DIABETIC-oral med RIGHT  . CATARACT EXTRACTION W/PHACO Left 04/23/2016   Procedure: CATARACT EXTRACTION PHACO AND INTRAOCULAR LENS PLACEMENT (IOC);  Surgeon: Ronnell Freshwater, MD;  Location: [place];  Service: Ophthalmology;  Laterality: Left;  LEFT DIABETIC - oral meds  . CHOLECYSTECTOMY  1967  . COLONOSCOPY W/ POLYPECTOMY  2012   Villous adenoma from the rectum without high-grade dysplasia, 10 mm  . COLONOSCOPY WITH PROPOFOL N/A 03/30/2015   Procedure: COLONOSCOPY WITH PROPOFOL;  Surgeon: Robert Bellow, MD;  Location: [facility] ENDOSCOPY;  Service: Endoscopy;  Laterality: N/A;  . CYST EXCISION     thumb  . CYSTECTOMY  06/14/04   mucous cyst excision with left thumb, IP joint debridement  . ESOPHAGOGASTRODUODENOSCOPY (EGD) WITH PROPOFOL N/A 06/11/2017   Procedure: ESOPHAGOGASTRODUODENOSCOPY (EGD) WITH PROPOFOL;  Surgeon: Lucilla Lame, MD;  Location: [facility] ENDOSCOPY;  Service: Endoscopy;  Laterality: N/A;  . ESOPHAGOGASTRODUODENOSCOPY (EGD) WITH PROPOFOL N/A 12/31/2017  Procedure: ESOPHAGOGASTRODUODENOSCOPY (EGD) WITH PROPOFOL;  Surgeon: [name], MD;  Location: [facility] ENDOSCOPY;  Service: Endoscopy;  Laterality: N/A;  . EUS N/A 07/04/2017   Procedure: UPPER ENDOSCOPIC ULTRASOUND (EUS) RADIAL;  Surgeon: [name], MD;  Location: [facility] ENDOSCOPY;  Service: Endoscopy;  Laterality: N/A;  . FLEXIBLE SIGMOIDOSCOPY N/A 06/11/2017   Procedure: FLEXIBLE SIGMOIDOSCOPY;  Surgeon: Lucilla Lame, MD;  Location: [facility] ENDOSCOPY;  Service: Endoscopy;  Laterality: N/A;  . HERNIA REPAIR  2013  . KYPHOPLASTY N/A  10/06/2015   Procedure: Thoracic twelve kyphoplasty;  Surgeon: Karie Chimera, MD;  Location: [place] NEURO ORS;  Service: Neurosurgery;  Laterality: N/A;  T12 Kyphoplasty  . PROSTATE BIOPSY  11/01/08   Dr. Reece Agar  . [place]  . VASECTOMY  1975    Family History  Problem Relation Age of Onset  . Cancer Father        liver  . Cancer Sister        breast  . Cancer Brother        skin  . Cancer Sister        breast  . Heart disease Sister        pacer  . Heart disease Sister   . Cancer Sister        breast cancer  . Heart disease Sister   . Colon cancer Neg Hx   . Prostate cancer Neg Hx     Social History:  reports that he has never smoked. He has never used smokeless tobacco. He reports that he does not drink alcohol or use drugs.  Retired Agricultural consultant; worked for [employer] for 44 years.  He was in the [employer].  He denies any exposure to radiation or toxins.  The patient is accompanied by his wife today.   Allergies:  Allergies  Allergen Reactions  . Flomax [Tamsulosin Hcl] Other (See Comments)    syncope  . Ibuprofen Other (See Comments)    Oral blisters at high doses  . Rosuvastatin     REACTION: muscle stiffness  . Sulfa Antibiotics Rash  . Sulfonamide Derivatives Rash    Current Medications: Current Outpatient Medications  Medication Sig Dispense Refill  . acetaminophen (TYLENOL) 500 MG tablet Take 500-1,000 mg by mouth every 6 (six) hours as needed (for pain.).    Marland Kitchen Ascorbic Acid (VITAMIN C) 1000 MG tablet Take 1,000 mg by mouth daily.    . diphenhydrAMINE (BENADRYL) 25 mg capsule Take 25 mg by mouth at bedtime as needed (for sleep.).     Marland Kitchen docusate sodium (COLACE) 100 MG capsule Take 100 mg by mouth 2 (two) times daily as needed (for constipation).    . folic acid (FOLVITE) 814 MCG tablet Take 400 mcg by mouth daily with lunch.     . glimepiride (AMARYL) 1 MG tablet Take 2 tablets (2 mg total) by mouth daily with breakfast. 180  tablet 3  . levothyroxine (SYNTHROID, LEVOTHROID) 75 MCG tablet Take 1 tablet (75 mcg total) by mouth daily. 30 tablet 1  . metFORMIN (GLUCOPHAGE) 500 MG tablet 1 tab in the morning, 2 in the evening.    . Multiple Vitamin (MULTIVITAMIN WITH MINERALS) TABS tablet Take 1 tablet by mouth daily. Centrum Silver    . pantoprazole (PROTONIX) 40 MG tablet Take 1 tablet (40 mg total) by mouth 2 (two) times daily. 180 tablet 1  . pravastatin (PRAVACHOL) 80 MG tablet Take 1 tablet (80 mg total) by mouth at bedtime.    Marland Kitchen  pyridOXINE (VITAMIN B-6) 100 MG tablet Take 100 mg by mouth 3 (three) times a week.     . benzonatate (TESSALON) 100 MG capsule Take 100 mg by mouth at bedtime as needed for cough.    . Chlorpheniramine-DM (CORICIDIN HBP COUGH/COLD PO) Take by mouth 4 (four) times daily as needed.     No current facility-administered medications for this visit.     Review of Systems  Constitutional: Negative for diaphoresis, fever, malaise/fatigue and weight loss (weight up 3 pounds).       "I feel pretty good". Back to PLOF. Working with PT for strength and endurance.   HENT: Positive for hearing loss (occupational). Negative for nosebleeds and sore throat.   Eyes: Positive for redness (subconjunctival hemorrhage on the LEFT). Negative for pain.  Respiratory: Negative for cough, hemoptysis, sputum production and shortness of breath.   Cardiovascular: Positive for leg swelling. Negative for chest pain, palpitations, orthopnea and PND.  Gastrointestinal: Positive for heartburn (on PPI therapy). Negative for abdominal pain, blood in stool, constipation, diarrhea, melena, nausea and vomiting.  Genitourinary: Negative for dysuria, frequency, hematuria and urgency.  Musculoskeletal: Positive for joint pain (arthritis in hands). Negative for back pain, falls and myalgias.  Skin: Negative for itching and rash.  Neurological: Negative for dizziness, tremors, weakness and headaches.       Shuffling gait   Endo/Heme/Allergies: Does not bruise/bleed easily.       T2DM - well controlled  Psychiatric/Behavioral: Negative for depression, memory loss and suicidal ideas. The patient is not nervous/anxious and does not have insomnia.   All other systems reviewed and are negative.  Performance status (ECOG): 2 - Symptomatic, <50% confined to bed  Physical Exam: Blood pressure (!) 149/82, pulse 78, temperature 98.2 F (36.8 C), temperature source Tympanic, resp. rate 18, weight 152 lb 4 oz (69.1 kg). GENERAL: Thin elderly gentleman sitting comfortably in the exam room in no acute distress. MENTAL STATUS:  Alert and oriented to person, place and time. HEAD:  Thin gray hair.  Normocephalic, atraumatic, face symmetric, no Cushingoid features. EYES:  Glasses.  Blue eyes.  Left scleral hemorrhage.  Pupils equal round and reactive to light and accomodation.  No scleral icterus. ENT:  Oropharynx clear without lesion.  Tongue normal. Mucous membranes moist.  RESPIRATORY:  Clear to auscultation without rales, wheezes or rhonchi. CARDIOVASCULAR:  Regular rate and rhythm without murmur, rub or gallop. ABDOMEN:  Soft, non-tender, with active bowel sounds, and no hepatosplenomegaly.  No masses. SKIN:  No rashes, ulcers or lesions. EXTREMITIES: Arthritic nodules in hands.  No edema, no skin discoloration or tenderness.  No palpable cords. LYMPH NODES: No palpable cervical, supraclavicular, axillary or inguinal adenopathy  NEUROLOGICAL: Shuffling gait.  Unremarkable. PSYCH:  Appropriate.    Appointment on 03/13/2018  Component Date Value Ref Range Status  . Sodium 03/13/2018 138  135 - 145 mmol/L Final  . Potassium 03/13/2018 4.3  3.5 - 5.1 mmol/L Final  . Chloride 03/13/2018 105  101 - 111 mmol/L Final  . CO2 03/13/2018 24  22 - 32 mmol/L Final  . Glucose, Bld 03/13/2018 155* 65 - 99 mg/dL Final  . BUN 03/13/2018 20  6 - 20 mg/dL Final  . Creatinine, Ser 03/13/2018 1.40* 0.61 - 1.24 mg/dL Final  . Calcium  03/13/2018 9.7  8.9 - 10.3 mg/dL Final  . Total Protein 03/13/2018 7.0  6.5 - 8.1 g/dL Final  . Albumin 03/13/2018 3.8  3.5 - 5.0 g/dL Final  . AST 03/13/2018 32  15 - 41 U/L Final  . ALT 03/13/2018 26  17 - 63 U/L Final  . Alkaline Phosphatase 03/13/2018 64  38 - 126 U/L Final  . Total Bilirubin 03/13/2018 0.8  0.3 - 1.2 mg/dL Final  . GFR calc non Af Amer 03/13/2018 44* >60 mL/min Final  . GFR calc Af Amer 03/13/2018 51* >60 mL/min Final   Comment: (NOTE) The eGFR has been calculated using the CKD EPI equation. This calculation has not been validated in all clinical situations. eGFR's persistently <60 mL/min signify possible Chronic Kidney Disease.   Georgiann Hahn gap 03/13/2018 9  5 - 15 Final   Performed at [facility], [address] [place], [place] [address]  . WBC 03/13/2018 7.2  3.8 - 10.6 K/uL Final  . RBC 03/13/2018 3.95* 4.40 - 5.90 MIL/uL Final  . Hemoglobin 03/13/2018 11.6* 13.0 - 18.0 g/dL Final  . HCT 03/13/2018 34.2* 40.0 - 52.0 % Final  . MCV 03/13/2018 86.6  80.0 - 100.0 fL Final  . MCH 03/13/2018 29.2  26.0 - 34.0 pg Final  . MCHC 03/13/2018 33.8  32.0 - 36.0 g/dL Final  . RDW 03/13/2018 15.7* 11.5 - 14.5 % Final  . Platelets 03/13/2018 192  150 - 440 K/uL Final  . Neutrophils Relative % 03/13/2018 84  % Final  . Neutro Abs 03/13/2018 6.1  1.4 - 6.5 K/uL Final  . Lymphocytes Relative 03/13/2018 6  % Final  . Lymphs Abs 03/13/2018 0.4* 1.0 - 3.6 K/uL Final  . Monocytes Relative 03/13/2018 8  % Final  . Monocytes Absolute 03/13/2018 0.5  0.2 - 1.0 K/uL Final  . Eosinophils Relative 03/13/2018 1  % Final  . Eosinophils Absolute 03/13/2018 0.1  0 - 0.7 K/uL Final  . Basophils Relative 03/13/2018 1  % Final  . Basophils Absolute 03/13/2018 0.1  0 - 0.1 K/uL Final   Performed at [facility], [address], [place], [place] [address]    Assessment:  RUMI TARAS is a 84 y.o. male with clinical stage IIB esophageal cancer (uT2N0).  He presented with an  upper GI bleed.  EGD on 06/11/2017 revealed one cratered esophageal ulcer and stigmata of recent esophogeal bleeding at the GE junction.  Biopsy revealed poorly differentiated adenocarcinoma, signet ring type.  There was one non-bleeding cratered gastric ulcer with no stigmata of bleeding in the gastric antrum.  Duodenum was normal.  Colonoscopy on 03/30/2015 revealed a 20 mm tubulovillous adenoma of the rectum.  Flexible sigmoidoscopy on 06/01/2017 revealed a sessile polyp in the rectum and grade II non-bleeding internal hemorrhoids.  PET scan  on 06/27/2017 that revealed no appreciable abnormal esophageal hypermetabolic activity to correspond with the reported tumor. There was no adenopathy or other findings of metastatic disease. There was question that the original tumor is either low-grade or very small.  Upper endoscopic ultrasound (EUS) on 07/04/2017 revealed a 1-2cm ulcerated mass at the GE junction that was partially circumferential and nonobstructive. The mass is 1 cm in thickness and occupies 3/4 of the  lumen circumference at the GE junction. The mass clearly passes into, but not through the muscularis propria layer of the esophageal wall. There was no paraesophageal adenopathy. Clinical stage was uT2N0 GE junction adenocarcinoma.  He received 5 weeks of carboplatin and Taxol with radiation (07/29/2017 - 09/03/2017).  Radiation completed on 03/07/2017.  PET scan on 12/04/2017 revealed a small focus of mild uptake (SUV 3.77; background 2.73) localizing to the caudate lobe of liver. There  was no corresponding CT abnormality.  Upper endoscopy on 12/31/2017 revealed erythematous mucosa in the esophagus.  Biopsy revealed no malignancy.   Symptomatically, he feels good.  He is eating well.  He has gained 3 pounds.  He denies any melena or hematochezia. Patient is working with physical therapy at home.  Exam is unremarkable.   Plan: 1. Labs today:  CBC with diff, CMP, CEA.  2. Discuss  interval EGD and biopsy. 3. Discuss plan for endoscopy in 3 months.  Typically EGDs are performed every 3-6 months x 2 years then annually x 3 years.  Chest and abdomen CT every 6-9 months x 2 years then annually x 3 years. Will discuss follow-up EGD with Dr. Allen Norris, as patient had EUS done in [facility] due to scheduling conflicts. 4. Discuss reflux. Patient continues to have reflux, but notes that his prescribed PPI therapy (Protonix 40 mg BID) helps tremendously. Will refill #180 r3.  5. RTC in 3 months for MD assessment, labs (CBC with dif, CMP, CEA).   Honor Loh, NP  03/13/2018, 2:43 PM   I saw and evaluated the patient, participating in the key portions of the service and reviewing pertinent diagnostic studies and records.  I reviewed the nurse practitioner's note and agree with the findings and the plan.  Several questions were asked by the patient and answered.   Nolon Stalls, MD 03/13/2018,2:43 PM

## 2018-03-14 ENCOUNTER — Other Ambulatory Visit: Payer: Self-pay | Admitting: Family Medicine

## 2018-03-14 LAB — CEA: CEA: 3.6 ng/mL (ref 0.0–4.7)

## 2018-03-14 MED ORDER — HYDROCHLOROTHIAZIDE 12.5 MG PO TABS: 12.5000 mg | ORAL_TABLET | Freq: Every day | ORAL | 3 refills | Status: DC

## 2018-03-14 MED ORDER — GLIMEPIRIDE 1 MG PO TABS: 2.0000 mg | ORAL_TABLET | Freq: Every day | ORAL | 3 refills | Status: DC

## 2018-03-14 NOTE — Addendum Note (Signed)
Addended by: Tonia Ghent on: 03/14/2018 11:53 PM   Modules accepted: Orders

## 2018-03-14 NOTE — Telephone Encounter (Signed)
Pt last seen 01/15/18 for f/u. HCTZ 12.5 mg on hx med list # 90 x 3 on 02/11/17 with note stop taking at discharge. Please advise.

## 2018-03-14 NOTE — Telephone Encounter (Signed)
Requesting refill of HCTZ  Not on current med list.  Pt's wife states seen by Oncologist yesterday and pt had "Some" edema bilateral ankles. Wanted Dr. Damita Dunnings to know. Last took HCTZ yesterday, is now out.

## 2018-03-14 NOTE — Telephone Encounter (Signed)
Copied from [place] [id number]. Topic: Quick Communication - Rx Refill/Question >> Mar 14, 2018 10:30 AM Nathanial Millman J wrote: Medication: HTCZ 12.5 mg, glimepiride (AMARYL) 1 MG tablet  Has the patient contacted their pharmacy? Yes.   (Agent: If no, request that the patient contact the pharmacy for the refill.) (Agent: If yes, when and what did the pharmacy advise?) they have to call us   Preferred Pharmacy (with phone number or street name): [facility] [address]  Pt is out of fluid pill    Agent: Please be advised that RX refills may take up to 3 business days. We ask that you follow-up with your pharmacy.

## 2018-03-14 NOTE — Telephone Encounter (Signed)
Has he been on HCTZ chronically?  I need clarification on that.   If he has, and has tolerated, then I can refill.   Thanks.

## 2018-03-14 NOTE — Telephone Encounter (Signed)
Paitent has taken HCTZ 12.5 mg daily and tolerates it well.

## 2018-03-14 NOTE — Telephone Encounter (Signed)
Pt called back to speak w/ a nurse about medication, if someone is in office after hours feel free to contact pt

## 2018-03-14 NOTE — Telephone Encounter (Signed)
Sent. Thanks.   

## 2018-03-17 NOTE — Telephone Encounter (Signed)
Spoke to [facility] (on [place]) and was advised that they picked the script up over the weekend.

## 2018-03-23 ENCOUNTER — Other Ambulatory Visit: Payer: Self-pay | Admitting: Family Medicine

## 2018-03-25 DIAGNOSIS — E119 Type 2 diabetes mellitus without complications: Secondary | ICD-10-CM | POA: Diagnosis not present

## 2018-04-22 ENCOUNTER — Ambulatory Visit: Payer: Medicare Other | Admitting: Family Medicine

## 2018-04-22 ENCOUNTER — Encounter: Payer: Self-pay | Admitting: Family Medicine

## 2018-04-22 VITALS — BP 144/80 | HR 85 | Temp 98.3°F | Ht 65.75 in | Wt 151.8 lb

## 2018-04-22 DIAGNOSIS — E119 Type 2 diabetes mellitus without complications: Secondary | ICD-10-CM

## 2018-04-22 DIAGNOSIS — S51012A Laceration without foreign body of left elbow, initial encounter: Secondary | ICD-10-CM | POA: Diagnosis not present

## 2018-04-22 DIAGNOSIS — C159 Malignant neoplasm of esophagus, unspecified: Secondary | ICD-10-CM

## 2018-04-22 LAB — POCT GLYCOSYLATED HEMOGLOBIN (HGB A1C): HEMOGLOBIN A1C: 7.3 % — AB (ref 4.0–5.6)

## 2018-04-22 MED ORDER — PRAVASTATIN SODIUM 80 MG PO TABS: 80.0000 mg | ORAL_TABLET | Freq: Every day | ORAL | 3 refills | Status: DC

## 2018-04-22 NOTE — Progress Notes (Signed)
He stumbled and hit L elbow about 5 days ago.  No LOC.  Didn't hit his head.  Home care in the meantime. Clean and dressed in the meantime.  Here for eval. we talked about restarting home health PT.  He declined this but wanted to do balance exercises at home.  Has been treated with statin.  Needed refill.  Done at office visit.  I send note to Dr. Vicente Males about f/u eval timing, for clarification.  He is swallowing well.   D/w pt.    Diabetes:  Using medications without difficulties: yes Hypoglycemic episodes:no Hyperglycemic episodes:no Feet problems: some BLE edema.  See below Blood Sugars averaging: 140 AMs, higher in the PMs A1c reasonable.  Discussed with patient at office visit.  Meds, vitals, and allergies reviewed.   ROS: Per HPI unless specifically indicated in ROS section   GEN: nad, alert and oriented HEENT: mucous membranes moist NECK: supple w/o LA CV: rrr. PULM: ctab, no inc wob ABD: soft, +bs EXT: 1+ BLE edema- this is recent baseline, worse at the end of the day.   SKIN: no acute rash L elbow with 5cm skin tear near the olecranon, 2cm neal medial epicondyle.  Neither appears infected.  Redressed.   Diabetic foot exam: Normal inspection No skin breakdown No calluses  Normal DP pulses Normal sensation to light touch and monofilament Nails normal except for thickened L 1st nail

## 2018-04-22 NOTE — Patient Instructions (Addendum)
Keep the areas clean and covered.  Use nonstick bandages. Update me as needed.  Your a1c was fine- recheck in about 3 months, sooner if needed.  We can go labs at the visit like today.  Don't change your meds for now.  Take care.  Glad to see you.

## 2018-04-23 NOTE — Assessment & Plan Note (Signed)
Reasonable control.  I do not want to induce hypoglycemia.  Continue as is.  Recheck in a few months.  He agrees.  Labs d/w pt.   Prescription sent for statin. >25 minutes spent in face to face time with patient, >50% spent in counselling or coordination of care.

## 2018-04-23 NOTE — Assessment & Plan Note (Addendum)
This is going to take a while to heal but it does appear to be healing normally in the meantime.  Does not appear infected.  Covered with Neosporin and nonstick bandages and then padded with 4 x 4's and wrapped per usual routine.  Tolerated well.  The bandage was staying in place with range of motion.  Continue with daily dressing changes per routine.  Update me as needed.  Patient agrees.  He declined referral back to physical therapy for gait training.  He wants to do balance exercises at home.

## 2018-04-23 NOTE — Assessment & Plan Note (Signed)
H/o.  I send note to Dr. Vicente Males about f/u eval timing, for clarification.  He is swallowing well.   D/w pt.

## 2018-04-25 ENCOUNTER — Telehealth: Payer: Self-pay

## 2018-04-25 ENCOUNTER — Other Ambulatory Visit: Payer: Self-pay

## 2018-04-25 DIAGNOSIS — C159 Malignant neoplasm of esophagus, unspecified: Secondary | ICD-10-CM

## 2018-04-25 NOTE — Telephone Encounter (Signed)
Left vm for Aaron Becker to return my call to schedule follow up EGD.

## 2018-04-25 NOTE — Telephone Encounter (Signed)
-----   Message from Lucilla Lame, MD sent at 03/17/2018  8:56 AM EDT ----- Oncology wants this patient to have an EGD every 3-6 months. Please make sure this patient is set up for that during that time.  ----- Message ----- From: Lequita Asal, MD Sent: 03/15/2018   8:17 PM To: Tonia Ghent, MD, Lucilla Lame, MD

## 2018-05-08 ENCOUNTER — Ambulatory Visit: Payer: Medicare Other | Admitting: Certified Registered"

## 2018-05-08 ENCOUNTER — Encounter
Admission: RE | Disposition: A | Discharge status: Home / Self Care | Payer: Self-pay | Source: Ambulatory Visit | Attending: Gastroenterology

## 2018-05-08 ENCOUNTER — Ambulatory Visit
Admission: RE | Admit: 2018-05-08 | Discharge: 2018-05-08 | Disposition: A | Discharge status: Home / Self Care | Payer: Medicare Other | Source: Ambulatory Visit | Attending: Gastroenterology | Admitting: Gastroenterology

## 2018-05-08 DIAGNOSIS — M1990 Unspecified osteoarthritis, unspecified site: Secondary | ICD-10-CM | POA: Insufficient documentation

## 2018-05-08 DIAGNOSIS — E785 Hyperlipidemia, unspecified: Secondary | ICD-10-CM | POA: Insufficient documentation

## 2018-05-08 DIAGNOSIS — I10 Essential (primary) hypertension: Secondary | ICD-10-CM | POA: Diagnosis not present

## 2018-05-08 DIAGNOSIS — Z79899 Other long term (current) drug therapy: Secondary | ICD-10-CM | POA: Diagnosis not present

## 2018-05-08 DIAGNOSIS — Z923 Personal history of irradiation: Secondary | ICD-10-CM | POA: Insufficient documentation

## 2018-05-08 DIAGNOSIS — C159 Malignant neoplasm of esophagus, unspecified: Secondary | ICD-10-CM

## 2018-05-08 DIAGNOSIS — E039 Hypothyroidism, unspecified: Secondary | ICD-10-CM | POA: Insufficient documentation

## 2018-05-08 DIAGNOSIS — Z8501 Personal history of malignant neoplasm of esophagus: Secondary | ICD-10-CM | POA: Diagnosis not present

## 2018-05-08 DIAGNOSIS — Z7984 Long term (current) use of oral hypoglycemic drugs: Secondary | ICD-10-CM | POA: Diagnosis not present

## 2018-05-08 DIAGNOSIS — Z08 Encounter for follow-up examination after completed treatment for malignant neoplasm: Secondary | ICD-10-CM | POA: Diagnosis not present

## 2018-05-08 DIAGNOSIS — C160 Malignant neoplasm of cardia: Secondary | ICD-10-CM | POA: Diagnosis not present

## 2018-05-08 DIAGNOSIS — E119 Type 2 diabetes mellitus without complications: Secondary | ICD-10-CM | POA: Insufficient documentation

## 2018-05-08 DIAGNOSIS — R972 Elevated prostate specific antigen [PSA]: Secondary | ICD-10-CM | POA: Insufficient documentation

## 2018-05-08 HISTORY — PX: ESOPHAGOGASTRODUODENOSCOPY (EGD) WITH PROPOFOL: SHX5813

## 2018-05-08 LAB — GLUCOSE, CAPILLARY: Glucose-Capillary: 133 mg/dL — ABNORMAL HIGH (ref 70–99)

## 2018-05-08 SURGERY — ESOPHAGOGASTRODUODENOSCOPY (EGD) WITH PROPOFOL
Anesthesia: General

## 2018-05-08 MED ORDER — SODIUM CHLORIDE 0.9 % IV SOLN: INTRAVENOUS | Status: DC

## 2018-05-08 MED ORDER — PROPOFOL 500 MG/50ML IV EMUL
INTRAVENOUS | Status: DC | PRN
  Administered 2018-05-08: 50 ug/kg/min via INTRAVENOUS

## 2018-05-08 MED ORDER — PROPOFOL 10 MG/ML IV BOLUS
INTRAVENOUS | Status: DC | PRN
  Administered 2018-05-08: 20 mg via INTRAVENOUS
  Administered 2018-05-08: 40 mg via INTRAVENOUS

## 2018-05-08 MED ORDER — LIDOCAINE HCL (CARDIAC) PF 100 MG/5ML IV SOSY
PREFILLED_SYRINGE | INTRAVENOUS | Status: DC | PRN
  Administered 2018-05-08: 100 mg via INTRAVENOUS

## 2018-05-08 MED ORDER — SODIUM CHLORIDE 0.9 % IV SOLN
INTRAVENOUS | Status: DC
  Administered 2018-05-08: 1000 mL via INTRAVENOUS

## 2018-05-08 MED ORDER — LIDOCAINE HCL (PF) 2 % IJ SOLN
INTRAMUSCULAR | Status: AC
  Filled 2018-05-08: qty 10

## 2018-05-08 NOTE — Anesthesia Post-op Follow-up Note (Signed)
Anesthesia QCDR form completed.        

## 2018-05-08 NOTE — Op Note (Signed)
[facility] Gastroenterology Patient Name: Aaron Becker Procedure Date: 05/08/2018 9:36 AM MRN: [id number] Account #: [id number] Date of Birth: 1930-07-27 Admit Type: Outpatient Age: 84 Room: [facility] ENDO ROOM 3 Gender: Male Note Status: Finalized Procedure:            Upper GI endoscopy Indications:          Follow-up of malignant esophageal tumor Providers:            [name] MD, MD Referring MD:         Elveria Rising. Damita Dunnings, MD (Referring MD) Medicines:            Monitored Anesthesia Care Complications:        No immediate complications. Procedure:            Pre-Anesthesia Assessment:                       - Prior to the procedure, a History and Physical was                        performed, and patient medications, allergies and                        sensitivities were reviewed. The patient's tolerance of                        previous anesthesia was reviewed.                       - The risks and benefits of the procedure and the                        sedation options and risks were discussed with the                        patient. All questions were answered and informed                        consent was obtained.                       - ASA Grade Assessment: III - A patient with severe                        systemic disease.                       After obtaining informed consent, the endoscope was                        passed under direct vision. Throughout the procedure,                        the patient's blood pressure, pulse, and oxygen                        saturations were monitored continuously. The Endoscope                        was introduced through the mouth, and advanced to the  third part of duodenum. The upper GI endoscopy was                        accomplished with ease. The patient tolerated the                        procedure well. Findings:      The examined duodenum was normal.      Localized mild  mucosal changes characterized by congestion were found in       the cardia. Biopsies were taken with a cold forceps for histology.      The examined esophagus was normal. Biopsies were taken with a cold       forceps for histology. Biopsies taken from GE junction which appeared       normal      The exam was otherwise without abnormality. Impression:           - Normal examined duodenum.                       - Congested mucosa in the cardia. Biopsied.                       - Normal esophagus. Biopsied.                       - The examination was otherwise normal. Recommendation:       - Discharge patient to home (with escort).                       - Resume previous diet.                       - Continue present medications.                       - Await pathology results.                       - Repeat EGD in 3-4 months Procedure Code(s):    --- Professional ---                       [phone], Esophagogastroduodenoscopy, flexible, transoral;                        with biopsy, single or multiple Diagnosis Code(s):    --- Professional ---                       K31.89, Other diseases of stomach and duodenum                       C15.9, Malignant neoplasm of esophagus, unspecified CPT copyright 2017 American Medical Association. All rights reserved. The codes documented in this report are preliminary and upon coder review may  be revised to meet current compliance requirements. [name], MD [name] MD, MD 05/08/2018 10:00:19 AM This report has been signed electronically. Number of Addenda: 0 Note Initiated On: 05/08/2018 9:36 AM      [facility]

## 2018-05-08 NOTE — Anesthesia Preprocedure Evaluation (Addendum)
Anesthesia Evaluation  Patient identified by MRN, date of birth, ID band Patient awake    Reviewed: Allergy & Precautions, NPO status , Patient's Chart, lab work & pertinent test results, reviewed documented beta blocker date and time   Airway Mallampati: II  TM Distance: >3 FB     Dental  (+) Upper Dentures, Lower Dentures   Pulmonary neg COPD,    breath sounds clear to auscultation       Cardiovascular hypertension, Pt. on medications (-) Past MI and (-) CABG  Rhythm:regular Rate:Normal     Neuro/Psych    GI/Hepatic   Endo/Other  diabetes, Type 2Hypothyroidism   Renal/GU      Musculoskeletal  (+) Arthritis ,   Abdominal   Peds  Hematology  (+) anemia ,   Anesthesia Other Findings Esophageal ca.  Reproductive/Obstetrics                            Anesthesia Physical  Anesthesia Plan  ASA: III  Anesthesia Plan: General   Post-op Pain Management:    Induction: Intravenous  PONV Risk Score and Plan: Propofol infusion and TIVA  Airway Management Planned:   Additional Equipment:   Intra-op Plan:   Post-operative Plan:   Informed Consent: I have reviewed the patients History and Physical, chart, labs and discussed the procedure including the risks, benefits and alternatives for the proposed anesthesia with the patient or authorized representative who has indicated his/her understanding and acceptance.   Dental Advisory Given  Plan Discussed with: CRNA and Anesthesiologist  Anesthesia Plan Comments:         Anesthesia Quick Evaluation

## 2018-05-08 NOTE — Anesthesia Postprocedure Evaluation (Signed)
Anesthesia Post Note  Patient: Aaron Becker  Procedure(s) Performed: ESOPHAGOGASTRODUODENOSCOPY (EGD) WITH PROPOFOL (N/A )  Patient location during evaluation: PACU Anesthesia Type: General Level of consciousness: awake and alert Pain management: pain level controlled Vital Signs Assessment: post-procedure vital signs reviewed and stable Respiratory status: spontaneous breathing, nonlabored ventilation, respiratory function stable and patient connected to nasal cannula oxygen Cardiovascular status: blood pressure returned to baseline and stable Postop Assessment: no apparent nausea or vomiting Anesthetic complications: no     Last Vitals:  Vitals:   05/08/18 1001 05/08/18 1021  BP: 121/69 (!) 149/79  Pulse: 63   Resp: 19   Temp: (!) 36.1 C   SpO2: 94%     Last Pain:  Vitals:   05/08/18 1031  TempSrc:   PainSc: 0-No pain                 Durenda Hurt

## 2018-05-08 NOTE — Transfer of Care (Signed)
Immediate Anesthesia Transfer of Care Note  Patient: Aaron Becker  Procedure(s) Performed: ESOPHAGOGASTRODUODENOSCOPY (EGD) WITH PROPOFOL (N/A )  Patient Location: PACU  Anesthesia Type:General  Level of Consciousness: sedated and responds to stimulation  Airway & Oxygen Therapy: Patient Spontanous Breathing and Patient connected to nasal cannula oxygen  Post-op Assessment: Report given to RN and Post -op Vital signs reviewed and stable  Post vital signs: Reviewed and stable  Last Vitals:  Vitals Value Taken Time  BP 121/69 05/08/2018 10:01 AM  Temp    Pulse 63 05/08/2018 10:01 AM  Resp 19 05/08/2018 10:01 AM  SpO2 94 % 05/08/2018 10:01 AM    Last Pain:  Vitals:   05/08/18 1001  TempSrc: (P) Tympanic  PainSc:          Complications: No apparent anesthesia complications

## 2018-05-08 NOTE — H&P (Signed)
Aaron Bellows, MD [address], [place], [place], [place], [address] [address], [place], [place], [place], [address] Phone: [phone]  Fax: [phone]  Primary Care Physician:  Aaron Ghent, MD   Pre-Procedure History & Physical: HPI:  Aaron Becker is a 84 y.o. male is here for an endoscopy    Past Medical History:  Diagnosis Date  . Anemia   . Arthritis    hands  . Bradycardia   . Colon polyp   . Diabetes mellitus, type II ([place]) 1986   oral meds  . Elevated PSA 2011   per Dr. Jacqlyn Becker with Uro no problems last 5 or 6 yrs  . Esophageal cancer ([place]) dx sept 18 2018  . HOH (hard of hearing)    bilateral AIDES  . Hyperlipidemia   . Hypertension 02/02   off bp meds since hospitalization 3 weeks ago  . Hypothyroidism 1980's  . Palpitations   . Right rib fracture 2015  . SVT (supraventricular tachycardia) ([place])    a. reported SVT during admission at [facility] 06/2014  . Syncope and collapse    none recent last few weeks  . Wears dentures    upper and lower    Past Surgical History:  Procedure Laterality Date  . [place], 09/2015   COMPRESSED FRACTURE SPINE  . carotid ultrasound  02/02   wnl  . CATARACT EXTRACTION W/PHACO Right 03/19/2016   Procedure: CATARACT EXTRACTION PHACO AND INTRAOCULAR LENS PLACEMENT (IOC);  Surgeon: Aaron Freshwater, MD;  Location: [facility];  Service: Ophthalmology;  Laterality: Right;  DIABETIC-oral med RIGHT  . CATARACT EXTRACTION W/PHACO Left 04/23/2016   Procedure: CATARACT EXTRACTION PHACO AND INTRAOCULAR LENS PLACEMENT (IOC);  Surgeon: Aaron Freshwater, MD;  Location: [facility];  Service: Ophthalmology;  Laterality: Left;  LEFT DIABETIC - oral meds  . CHOLECYSTECTOMY  1967  . COLONOSCOPY W/ POLYPECTOMY  2012   Villous adenoma from the rectum without high-grade dysplasia, 10 mm  . COLONOSCOPY WITH PROPOFOL N/A 03/30/2015   Procedure: COLONOSCOPY WITH PROPOFOL;  Surgeon: Aaron Bellow, MD;  Location: [facility] ENDOSCOPY;  Service: Endoscopy;  Laterality: N/A;  . CYST EXCISION     thumb  . CYSTECTOMY  06/14/04   mucous cyst excision with left thumb, IP joint debridement  . ESOPHAGOGASTRODUODENOSCOPY (EGD) WITH PROPOFOL N/A 06/11/2017   Procedure: ESOPHAGOGASTRODUODENOSCOPY (EGD) WITH PROPOFOL;  Surgeon: Aaron Lame, MD;  Location: [facility] ENDOSCOPY;  Service: Endoscopy;  Laterality: N/A;  . ESOPHAGOGASTRODUODENOSCOPY (EGD) WITH PROPOFOL N/A 12/31/2017   Procedure: ESOPHAGOGASTRODUODENOSCOPY (EGD) WITH PROPOFOL;  Surgeon: Aaron Bellows, MD;  Location: [facility] ENDOSCOPY;  Service: Endoscopy;  Laterality: N/A;  . EUS N/A 07/04/2017   Procedure: UPPER ENDOSCOPIC ULTRASOUND (EUS) RADIAL;  Surgeon: Aaron Banister, MD;  Location: [facility] ENDOSCOPY;  Service: Endoscopy;  Laterality: N/A;  . FLEXIBLE SIGMOIDOSCOPY N/A 06/11/2017   Procedure: FLEXIBLE SIGMOIDOSCOPY;  Surgeon: Aaron Lame, MD;  Location: [facility] ENDOSCOPY;  Service: Endoscopy;  Laterality: N/A;  . HERNIA REPAIR  2013  . KYPHOPLASTY N/A 10/06/2015   Procedure: Thoracic twelve kyphoplasty;  Surgeon: Aaron Chimera, MD;  Location: [facility] NEURO ORS;  Service: Neurosurgery;  Laterality: N/A;  T12 Kyphoplasty  . PROSTATE BIOPSY  11/01/08   Dr. Reece Becker  . [place]  . VASECTOMY  1975    Prior to Admission medications   Medication Sig Start Date End Date Taking? Authorizing Provider  acetaminophen (TYLENOL) 500 MG tablet Take 500-1,000  mg by mouth every 6 (six) hours as needed (for pain.).   Yes [provider]  Ascorbic Acid (VITAMIN C) 1000 MG tablet Take 1,000 mg by mouth daily.   Yes [provider]  benzonatate (TESSALON) 100 MG capsule Take 100 mg by mouth at bedtime as needed for cough.   Yes [provider]  Chlorpheniramine-DM (CORICIDIN HBP COUGH/COLD PO) Take by mouth 4 (four) times daily as needed.   Yes [provider]  diphenhydrAMINE (BENADRYL) 25 mg capsule Take 25 mg  by mouth at bedtime as needed (for sleep.).    Yes [provider]  docusate sodium (COLACE) 100 MG capsule Take 100 mg by mouth 2 (two) times daily as needed (for constipation).   Yes [provider]  glimepiride (AMARYL) 1 MG tablet Take 2 tablets (2 mg total) by mouth daily with breakfast. 03/14/18  Yes Aaron Ghent, MD  hydrochlorothiazide (HYDRODIURIL) 12.5 MG tablet Take 1 tablet (12.5 mg total) by mouth daily. 03/14/18  Yes Aaron Ghent, MD  levothyroxine (SYNTHROID, LEVOTHROID) 75 MCG tablet TAKE 1 TABLET(75 MCG) BY MOUTH DAILY 03/24/18  Yes Aaron Ghent, MD  metFORMIN (GLUCOPHAGE) 500 MG tablet 1 tab in the morning, 2 in the evening. 01/15/18  Yes Aaron Ghent, MD  Multiple Vitamin (MULTIVITAMIN WITH MINERALS) TABS tablet Take 1 tablet by mouth daily. Centrum Silver   Yes [provider]  pantoprazole (PROTONIX) 40 MG tablet Take 1 tablet (40 mg total) by mouth 2 (two) times daily. 03/13/18  Yes Aaron Kitchens, NP  pravastatin (PRAVACHOL) 80 MG tablet Take 1 tablet (80 mg total) by mouth at bedtime. 04/22/18  Yes Aaron Ghent, MD  pyridOXINE (VITAMIN B-6) 100 MG tablet Take 100 mg by mouth 3 (three) times a week.    Yes [provider]    Allergies as of 04/25/2018 - Review Complete 04/22/2018  Allergen Reaction Noted  . Flomax [tamsulosin hcl] Other (See Comments) 07/26/2014  . Ibuprofen Other (See Comments) 02/02/2015  . Rosuvastatin  01/23/2007  . Sulfa antibiotics Rash 07/07/2014  . Sulfonamide derivatives Rash 01/23/2007    Family History  Problem Relation Age of Onset  . Cancer Father        liver  . Cancer Sister        breast  . Cancer Brother        skin  . Cancer Sister        breast  . Heart disease Sister        pacer  . Heart disease Sister   . Cancer Sister        breast cancer  . Heart disease Sister   . Colon cancer Neg Hx   . Prostate cancer Neg Hx     Social History   Socioeconomic History  .  Marital status: Widowed    Spouse name: Not on file  . Number of children: Not on file  . Years of education: Not on file  . Highest education level: Not on file  Occupational History  . Occupation: Pharmacist, community: retired    Comment: Retired 1999  Social Needs  . Financial resource strain: Not on file  . Food insecurity:    Worry: Not on file    Inability: Not on file  . Transportation needs:    Medical: Not on file    Non-medical: Not on file  Tobacco Use  . Smoking status: Never Smoker  .  Smokeless tobacco: Never Used  Substance and Sexual Activity  . Alcohol use: No    Alcohol/week: 0.0 standard drinks  . Drug use: No  . Sexual activity: Never  Lifestyle  . Physical activity:    Days per week: Not on file    Minutes per session: Not on file  . Stress: Not on file  Relationships  . Social connections:    Talks on phone: Not on file    Gets together: Not on file    Attends religious service: Not on file    Active member of club or organization: Not on file    Attends meetings of clubs or organizations: Not on file    Relationship status: Not on file  . Intimate partner violence:    Fear of current or ex partner: Not on file    Emotionally abused: Not on file    Physically abused: Not on file    Forced sexual activity: Not on file  Other Topics Concern  . Not on file  Social History Narrative   [place]- [place] 2000 ([place]), not marred but with partner since 2001.    Retired from [employer].     [employer], stationed in [place]: See HPI, otherwise negative ROS  Physical Exam: BP (!) 150/85   Pulse 80   Temp 98.4 F (36.9 C) (Tympanic)   Resp 20   Ht 5\' 3"  (1.6 m)   Wt 68 kg   SpO2 99%   BMI 26.57 kg/m  General:   Alert,  pleasant and cooperative in NAD Head:  Normocephalic and atraumatic. Neck:  Supple; no masses or thyromegaly. Lungs:  Clear throughout to auscultation, normal respiratory effort.     Heart:  +S1, +S2, Regular rate and rhythm, No edema. Abdomen:  Soft, nontender and nondistended. Normal bowel sounds, without guarding, and without rebound.   Neurologic:  Alert and  oriented x4;  grossly normal neurologically.  Impression/Plan: Aaron Becker is here for an endoscopy  to be performed for  evaluation of esophageal cancer    Risks, benefits, limitations, and alternatives regarding endoscopy have been reviewed with the patient.  Questions have been answered.  All parties agreeable.   Aaron Bellows, MD  05/08/2018, 9:34 AM

## 2018-05-08 NOTE — Anesthesia Procedure Notes (Signed)
Performed by: Reyann Troop, CRNA Pre-anesthesia Checklist: Patient identified, Emergency Drugs available, Suction available, Patient being monitored and Timeout performed Patient Re-evaluated:Patient Re-evaluated prior to induction Oxygen Delivery Method: Nasal cannula Induction Type: IV induction       

## 2018-05-12 ENCOUNTER — Encounter: Payer: Self-pay | Admitting: Gastroenterology

## 2018-05-15 ENCOUNTER — Inpatient Hospital Stay: Payer: Medicare Other | Attending: Hematology and Oncology | Admitting: Hematology and Oncology

## 2018-05-15 VITALS — BP 151/81 | HR 76 | Temp 97.7°F | Resp 18 | Wt 151.2 lb

## 2018-05-15 DIAGNOSIS — C159 Malignant neoplasm of esophagus, unspecified: Secondary | ICD-10-CM

## 2018-05-15 DIAGNOSIS — Z803 Family history of malignant neoplasm of breast: Secondary | ICD-10-CM | POA: Insufficient documentation

## 2018-05-15 DIAGNOSIS — Z9221 Personal history of antineoplastic chemotherapy: Secondary | ICD-10-CM | POA: Insufficient documentation

## 2018-05-15 DIAGNOSIS — E785 Hyperlipidemia, unspecified: Secondary | ICD-10-CM | POA: Insufficient documentation

## 2018-05-15 DIAGNOSIS — I10 Essential (primary) hypertension: Secondary | ICD-10-CM | POA: Diagnosis not present

## 2018-05-15 DIAGNOSIS — E119 Type 2 diabetes mellitus without complications: Secondary | ICD-10-CM | POA: Diagnosis not present

## 2018-05-15 DIAGNOSIS — Z79899 Other long term (current) drug therapy: Secondary | ICD-10-CM | POA: Diagnosis not present

## 2018-05-15 DIAGNOSIS — H918X9 Other specified hearing loss, unspecified ear: Secondary | ICD-10-CM | POA: Insufficient documentation

## 2018-05-15 DIAGNOSIS — M1990 Unspecified osteoarthritis, unspecified site: Secondary | ICD-10-CM | POA: Insufficient documentation

## 2018-05-15 DIAGNOSIS — Z7984 Long term (current) use of oral hypoglycemic drugs: Secondary | ICD-10-CM | POA: Diagnosis not present

## 2018-05-15 DIAGNOSIS — C169 Malignant neoplasm of stomach, unspecified: Secondary | ICD-10-CM

## 2018-05-15 DIAGNOSIS — Z808 Family history of malignant neoplasm of other organs or systems: Secondary | ICD-10-CM | POA: Diagnosis not present

## 2018-05-15 DIAGNOSIS — Z923 Personal history of irradiation: Secondary | ICD-10-CM | POA: Diagnosis not present

## 2018-05-15 DIAGNOSIS — C160 Malignant neoplasm of cardia: Secondary | ICD-10-CM | POA: Diagnosis not present

## 2018-05-15 DIAGNOSIS — E039 Hypothyroidism, unspecified: Secondary | ICD-10-CM | POA: Diagnosis not present

## 2018-05-15 NOTE — Progress Notes (Signed)
Patient here today as acute add regarding new findings.  Patient is accompanied by his family today.

## 2018-05-15 NOTE — Progress Notes (Signed)
[facility] day:  05/15/2018   Chief Complaint: TALLON GERTZ is a 84 y.o. male with stage IIB esophageal carcinoma who is seen for 2 month assessment after interval EGD.  HPI:  The patient was last seen in the medical  oncology clinic on 03/13/2018.   At that time, he felt good.  He was eating well.  He had gained 3 pounds.  He denied any melena or hematochezia. He was working with physical therapy at home.  Exam was unremarkable. He was referred for follow-up EGD.  EGD on 05/08/2018 by Dr. Vicente Males revealed normal duodenum.  There was congested mucosa in the cardia. There was a normal esophagus. GE junction biopsy revealed squamocolumnar mucosa and columnar-lined mucosa with mild chronic inflammation, negative for goblet cells, dysplasia and malignancy. 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  During the interim, he has felt great.  He denies any complaints.  He denies any swallowing difficulties or epigastric discomfort.  He notes a "crampy stomach from time to time".  Weight is stable.   Past Medical History:  Diagnosis Date  . Anemia   . Arthritis    hands  . Bradycardia   . Colon polyp   . Diabetes mellitus, type II ([place]) 1986   oral meds  . Elevated PSA 2011   per Dr. Jacqlyn Larsen with Uro no problems last 5 or 6 yrs  . Esophageal cancer ([place]) dx sept 18 2018  . HOH (hard of hearing)    bilateral AIDES  . Hyperlipidemia   . Hypertension 02/02   off bp meds since hospitalization 3 weeks ago  . Hypothyroidism 1980's  . Palpitations   . Right rib fracture 2015  . SVT (supraventricular tachycardia) ([place])    a. reported SVT during admission at [facility] 06/2014  . Syncope and collapse    none recent last few weeks  . Wears dentures    upper and lower    Past Surgical History:  Procedure Laterality Date  . [place], 09/2015   COMPRESSED FRACTURE SPINE  . carotid ultrasound  02/02    wnl  . CATARACT EXTRACTION W/PHACO Right 03/19/2016   Procedure: CATARACT EXTRACTION PHACO AND INTRAOCULAR LENS PLACEMENT (IOC);  Surgeon: Ronnell Freshwater, MD;  Location: [place];  Service: Ophthalmology;  Laterality: Right;  DIABETIC-oral med RIGHT  . CATARACT EXTRACTION W/PHACO Left 04/23/2016   Procedure: CATARACT EXTRACTION PHACO AND INTRAOCULAR LENS PLACEMENT (IOC);  Surgeon: Ronnell Freshwater, MD;  Location: [place];  Service: Ophthalmology;  Laterality: Left;  LEFT DIABETIC - oral meds  . CHOLECYSTECTOMY  1967  . COLONOSCOPY W/ POLYPECTOMY  2012   Villous adenoma from the rectum without high-grade dysplasia, 10 mm  . COLONOSCOPY WITH PROPOFOL N/A 03/30/2015   Procedure: COLONOSCOPY WITH PROPOFOL;  Surgeon: Robert Bellow, MD;  Location: [facility] ENDOSCOPY;  Service: Endoscopy;  Laterality: N/A;  . CYST EXCISION     thumb  . CYSTECTOMY  06/14/04   mucous cyst excision with left thumb, IP joint debridement  . ESOPHAGOGASTRODUODENOSCOPY (EGD) WITH PROPOFOL N/A 06/11/2017   Procedure: ESOPHAGOGASTRODUODENOSCOPY (EGD) WITH PROPOFOL;  Surgeon: Lucilla Lame, MD;  Location: [facility] ENDOSCOPY;  Service: Endoscopy;  Laterality: N/A;  . ESOPHAGOGASTRODUODENOSCOPY (EGD) WITH PROPOFOL N/A 12/31/2017   Procedure: ESOPHAGOGASTRODUODENOSCOPY (EGD) WITH PROPOFOL;  Surgeon: [name], MD;  Location: [facility] ENDOSCOPY;  Service: Endoscopy;  Laterality: N/A;  . ESOPHAGOGASTRODUODENOSCOPY (EGD) WITH PROPOFOL N/A  05/08/2018   Procedure: ESOPHAGOGASTRODUODENOSCOPY (EGD) WITH PROPOFOL;  Surgeon: [name], MD;  Location: [facility] ENDOSCOPY;  Service: Gastroenterology;  Laterality: N/A;  . EUS N/A 07/04/2017   Procedure: UPPER ENDOSCOPIC ULTRASOUND (EUS) RADIAL;  Surgeon: [name], MD;  Location: [facility] ENDOSCOPY;  Service: Endoscopy;  Laterality: N/A;  . FLEXIBLE SIGMOIDOSCOPY N/A 06/11/2017   Procedure: FLEXIBLE SIGMOIDOSCOPY;  Surgeon: Lucilla Lame, MD;  Location: [facility]  ENDOSCOPY;  Service: Endoscopy;  Laterality: N/A;  . HERNIA REPAIR  2013  . KYPHOPLASTY N/A 10/06/2015   Procedure: Thoracic twelve kyphoplasty;  Surgeon: Karie Chimera, MD;  Location: [facility]URO [facility]S;  Service: Neurosurgery;  Laterality: N/A;  T12 Kyphoplasty  . PROSTATE BIOPSY  11/01/08   Dr. Reece Agar  . [place]  . VASECTOMY  1975    Family History  Problem Relation Age of Onset  . Cancer Father        liver  . Cancer Sister        breast  . Cancer Brother        skin  . Cancer Sister        breast  . Heart disease Sister        pacer  . Heart disease Sister   . Cancer Sister        breast cancer  . Heart disease Sister   . Colon cancer Neg Hx   . Prostate cancer Neg Hx     Social History:  reports that he has never smoked. He has never used smokeless tobacco. He reports that he does not drink alcohol or use drugs.  Retired Agricultural consultant; worked for [employer] for 44 years.  He was in the [employer].  He denies any exposure to radiation or toxins.  The patient is accompanied by his daughter and wife today.   Allergies:  Allergies  Allergen Reactions  . Flomax [Tamsulosin Hcl] Other (See Comments)    syncope  . Ibuprofen Other (See Comments)    Oral blisters at high doses  . Rosuvastatin     REACTION: muscle stiffness  . Sulfa Antibiotics Rash  . Sulfonamide Derivatives Rash    Current Medications: Current Outpatient Medications  Medication Sig Dispense Refill  . acetaminophen (TYLENOL) 500 MG tablet Take 500-1,000 mg by mouth every 6 (six) hours as needed (for pain.).    Marland Kitchen Ascorbic Acid (VITAMIN C) 1000 MG tablet Take 1,000 mg by mouth daily.    . benzonatate (TESSALON) 100 MG capsule Take 100 mg by mouth at bedtime as needed for cough.    . Chlorpheniramine-DM (CORICIDIN HBP COUGH/COLD PO) Take by mouth 4 (four) times daily as needed.    . diphenhydrAMINE (BENADRYL) 25 mg capsule Take 25 mg by mouth at bedtime as needed (for sleep.).      Marland Kitchen docusate sodium (COLACE) 100 MG capsule Take 100 mg by mouth 2 (two) times daily as needed (for constipation).    Marland Kitchen glimepiride (AMARYL) 1 MG tablet Take 2 tablets (2 mg total) by mouth daily with breakfast. 180 tablet 3  . hydrochlorothiazide (HYDRODIURIL) 12.5 MG tablet Take 1 tablet (12.5 mg total) by mouth daily. 90 tablet 3  . levothyroxine (SYNTHROID, LEVOTHROID) 75 MCG tablet TAKE 1 TABLET(75 MCG) BY MOUTH DAILY 90 tablet 1  . metFORMIN (GLUCOPHAGE) 500 MG tablet 1 tab in the morning, 2 in the evening.    . Multiple Vitamin (MULTIVITAMIN WITH MINERALS) TABS tablet Take 1 tablet by mouth daily. Centrum Silver    .  pantoprazole (PROTONIX) 40 MG tablet Take 1 tablet (40 mg total) by mouth 2 (two) times daily. 180 tablet 3  . pravastatin (PRAVACHOL) 80 MG tablet Take 1 tablet (80 mg total) by mouth at bedtime. 90 tablet 3  . pyridOXINE (VITAMIN B-6) 100 MG tablet Take 100 mg by mouth 3 (three) times a week.      No current facility-administered medications for this visit.     Review of Systems  Constitutional: Negative for chills, diaphoresis, fever, malaise/fatigue and weight loss (stable).       Feels "great".  HENT: Positive for hearing loss (occupational). Negative for congestion, nosebleeds, sinus pain and sore throat.   Eyes: Negative.  Negative for blurred vision, double vision, photophobia, pain and redness.  Respiratory: Negative.  Negative for sputum production and shortness of breath.   Cardiovascular: Negative.  Negative for chest pain, palpitations, orthopnea, leg swelling and PND.  Gastrointestinal: Negative for abdominal pain, blood in stool, constipation, diarrhea, heartburn, melena, nausea and vomiting.       Crarmping in stomach from time to time  Genitourinary: Negative.  Negative for dysuria, frequency, hematuria and urgency.  Musculoskeletal: Positive for joint pain (arthritis in hands). Negative for back pain, falls and myalgias.  Skin: Negative.  Negative for  itching and rash.  Neurological: Negative for dizziness, tremors, speech change, focal weakness, weakness and headaches.       Shuffling gait  Endo/Heme/Allergies: Bruises/bleeds easily.       Type II diabetes  Psychiatric/Behavioral: Negative for depression. The patient is not nervous/anxious and does not have insomnia.   All other systems reviewed and are negative.  Performance status (ECOG): 2 - Symptomatic, <50% confined to bed  Physical Exam: Blood pressure (!) 151/81, pulse 76, temperature 97.7 F (36.5 C), temperature source Tympanic, resp. rate 18, weight 151 lb 3 oz (68.6 kg). GENERAL:  Thin elderly gentleman sitting comfortably in the exam room in no acute distress. MENTAL STATUS:  Alert and oriented to person, place and time. HEAD:  Pearline Cables hair.  Normocephalic, atraumatic, face symmetric, no Cushingoid features. EYES:  Glasses.  Blue eyes.  Pupils equal round and reactive to light and accomodation.  No conjunctivitis or scleral icterus. ENT:  Oropharynx clear without lesion.  Tongue normal.  Dentures.  Mucous membranes moist.  RESPIRATORY:  Clear to auscultation without rales, wheezes or rhonchi. CARDIOVASCULAR:  Regular rate and rhythm without murmur, rub or gallop. ABDOMEN:  Soft, non-tender, with active bowel sounds, and no hepatosplenomegaly.  No masses. SKIN:  Fragile skin.  No rashes, ulcers or lesions. EXTREMITIES: No edema, no skin discoloration or tenderness.  No palpable cords. LYMPH NODES: No palpable cervical, supraclavicular, axillary or inguinal adenopathy  NEUROLOGICAL: Unremarkable. PSYCH:  Appropriate.    No visits with results within 3 Day(s) from this visit.  Latest known visit with results is:  Admission on 05/08/2018, Discharged on 05/08/2018  Component Date Value Ref Range Status  . Glucose-Capillary 05/08/2018 133* 70 - 99 mg/dL Final  . Comment 1 05/08/2018 IN EPIC   Final  . SURGICAL PATHOLOGY 05/08/2018    Final                   Value:Surgical  Pathology CASE: [id number] PATIENT: Reuven Manocchio Surgical Pathology Report   SPECIMEN SUBMITTED: A. GEJ, R/O malignancy; cbxs B. Stomach, cardia R/O malignancy; cbxs  CLINICAL HISTORY: None provided  PRE-OPERATIVE DIAGNOSIS: History of esophageal cancer  POST-OPERATIVE DIAGNOSIS: Normal EGD    DIAGNOSIS: A.  GASTROESOPHAGEAL JUNCTION; COLD BIOPSY: -  SQUAMOCOLUMNAR MUCOSA AND COLUMNAR-LINED MUCOSA WITH MILD CHRONIC INFLAMMATION. - NEGATIVE FOR GOBLET CELLS, DYSPLASIA, AND MALIGNANCY.  B.  STOMACH, CARDIA; COLD BIOPSY: - ADENOCARCINOMA, SIGNET RING CELL TYPE, INVOLVING 1 OF 4 FRAGMENTS.  Comment: Additional deeper sections were reviewed on A and B. The previous history is noted.   GROSS DESCRIPTION: A. Labeled: GEJ cbx rule out malignancy Received: In formalin Tissue fragment(s): Multiple Size: aggregate, 0.6 x 0.1 x 0.1cm Description: Pink-tan fragments Entirely submitted in one cassette.  B. Labeled: Cbx cardia, rule out malig                         nancy Received: In formalin Tissue fragment(s): 4 Size: 0.2-0.3 cm Description: Pink fragments Entirely submitted in one cassette.   Final Diagnosis performed by Bryan Lemma, MD.   Electronically signed 05/11/2018 3:17:29PM The electronic signature indicates that the named Attending Pathologist has evaluated the specimen  Technical component performed at [facility], [address], [place], [place] [address] Lab: [phone] Dir: Rush Farmer, MD, MMM  Professional component performed at [facility], [facility], [place], [place], [place] [address] Lab: [phone] Dir: Dellia Nims. Rubinas, MD     Assessment:  Joshuajames THOMOS DOMINE is a 84 y.o. male with clinical stage IIB esophageal cancer (uT2N0).  He presented with an upper GI bleed.  EGD on 06/11/2017 revealed one cratered esophageal ulcer and stigmata of recent esophogeal bleeding at the GE junction.  Biopsy revealed poorly differentiated  adenocarcinoma, signet ring type.  There was one non-bleeding cratered gastric ulcer with no stigmata of bleeding in the gastric antrum.  Duodenum was normal.  Colonoscopy on 03/30/2015 revealed a 20 mm tubulovillous adenoma of the rectum.  Flexible sigmoidoscopy on 06/01/2017 revealed a sessile polyp in the rectum and grade II non-bleeding internal hemorrhoids.  PET scan  on 06/27/2017 that revealed no appreciable abnormal esophageal hypermetabolic activity to correspond with the reported tumor. There was no adenopathy or other findings of metastatic disease. There was question that the original tumor is either low-grade or very small.  Upper endoscopic ultrasound (EUS) on 07/04/2017 revealed a 1-2cm ulcerated mass at the GE junction that was partially circumferential and nonobstructive. The mass is 1 cm in thickness and occupies 3/4 of the  lumen circumference at the GE junction. The mass clearly passes into, but not through the muscularis propria layer of the esophageal wall. There was no paraesophageal adenopathy. Clinical stage was uT2N0 GE junction adenocarcinoma.  He received 5 weeks of carboplatin and Taxol with radiation (07/29/2017 - 09/03/2017).  Radiation completed on Aug 05, 202018.  PET scan on 12/04/2017 revealed a small focus of mild uptake (SUV 3.77; background 2.73) localizing to the caudate lobe of liver. There was no corresponding CT abnormality.  Upper endoscopy on 12/31/2017 revealed erythematous mucosa in the esophagus.  Biopsy revealed no malignancy.   EGD on 05/08/2018 revealed a normal esophagus and congested mucosa in the cardia.  GE junction biopsy revealed squamocolumnar mucosa and columnar-lined mucosa with mild chronic inflammation, negative for goblet cells, dysplasia and malignancy. 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  Symptomatically, he feels great.  Weight is stable. Exam is unremarkable.  Plan: 1.  Clinical  stage IIB esophageal cancer:  Discuss interval EGD and biopsy- gastric cardia revealed signet ringed cells.  Phone follow-up with Dr Vicente Males regarding new area of disease (gastric) vs persistent disease (esophageal).  Discuss PET scan to r/o metastatic disease.  If PET scan negative,  refer to GI (Dr. Owens Loffler in [place]) for endoscopic ultrasound and possible resection. 2.  Schedule PET scan. 3.  RTC after PET scan for MD assessment and discussion regarding direction of therapy.  A total of (>20) minutes of face-to-face time was spent with the patient with greater than 50% of that time in counseling and care-coordination.    Lequita Asal, MD  05/15/2018, 11:21 AM

## 2018-05-18 ENCOUNTER — Other Ambulatory Visit: Payer: Self-pay | Admitting: Family Medicine

## 2018-05-20 ENCOUNTER — Encounter
Admission: RE | Admit: 2018-05-20 | Discharge: 2018-05-20 | Disposition: A | Discharge status: Home / Self Care | Payer: Medicare Other | Source: Ambulatory Visit | Attending: Urgent Care | Admitting: Urgent Care

## 2018-05-20 DIAGNOSIS — C159 Malignant neoplasm of esophagus, unspecified: Secondary | ICD-10-CM | POA: Insufficient documentation

## 2018-05-20 LAB — GLUCOSE, CAPILLARY: GLUCOSE-CAPILLARY: 103 mg/dL — AB (ref 70–99)

## 2018-05-20 MED ORDER — FLUDEOXYGLUCOSE F - 18 (FDG) INJECTION
7.8600 | Freq: Once | INTRAVENOUS | Status: AC | PRN
  Administered 2018-05-20: 7.86 via INTRAVENOUS

## 2018-05-23 ENCOUNTER — Other Ambulatory Visit: Payer: Self-pay

## 2018-05-23 ENCOUNTER — Encounter: Payer: Self-pay | Admitting: Hematology and Oncology

## 2018-05-23 ENCOUNTER — Other Ambulatory Visit: Payer: Self-pay | Admitting: Hematology and Oncology

## 2018-05-23 ENCOUNTER — Inpatient Hospital Stay (HOSPITAL_BASED_OUTPATIENT_CLINIC_OR_DEPARTMENT_OTHER): Payer: Medicare Other | Admitting: Hematology and Oncology

## 2018-05-23 VITALS — BP 148/73 | HR 74 | Temp 97.8°F | Resp 18 | Wt 152.1 lb

## 2018-05-23 DIAGNOSIS — E039 Hypothyroidism, unspecified: Secondary | ICD-10-CM

## 2018-05-23 DIAGNOSIS — C160 Malignant neoplasm of cardia: Secondary | ICD-10-CM

## 2018-05-23 DIAGNOSIS — Z9221 Personal history of antineoplastic chemotherapy: Secondary | ICD-10-CM

## 2018-05-23 DIAGNOSIS — M1990 Unspecified osteoarthritis, unspecified site: Secondary | ICD-10-CM

## 2018-05-23 DIAGNOSIS — E119 Type 2 diabetes mellitus without complications: Secondary | ICD-10-CM

## 2018-05-23 DIAGNOSIS — E785 Hyperlipidemia, unspecified: Secondary | ICD-10-CM

## 2018-05-23 DIAGNOSIS — I10 Essential (primary) hypertension: Secondary | ICD-10-CM

## 2018-05-23 DIAGNOSIS — Z808 Family history of malignant neoplasm of other organs or systems: Secondary | ICD-10-CM

## 2018-05-23 DIAGNOSIS — C159 Malignant neoplasm of esophagus, unspecified: Secondary | ICD-10-CM

## 2018-05-23 DIAGNOSIS — C169 Malignant neoplasm of stomach, unspecified: Secondary | ICD-10-CM

## 2018-05-23 DIAGNOSIS — Z79899 Other long term (current) drug therapy: Secondary | ICD-10-CM

## 2018-05-23 DIAGNOSIS — Z7984 Long term (current) use of oral hypoglycemic drugs: Secondary | ICD-10-CM

## 2018-05-23 DIAGNOSIS — Z923 Personal history of irradiation: Secondary | ICD-10-CM | POA: Diagnosis not present

## 2018-05-23 DIAGNOSIS — Z803 Family history of malignant neoplasm of breast: Secondary | ICD-10-CM

## 2018-05-23 DIAGNOSIS — H918X9 Other specified hearing loss, unspecified ear: Secondary | ICD-10-CM

## 2018-05-23 NOTE — Progress Notes (Signed)
Patient here for follow up. No concerns voiced.  °

## 2018-05-23 NOTE — Progress Notes (Signed)
[facility] day:  05/23/2018   Chief Complaint: Aaron Becker is a 84 y.o. male with stage IIB esophageal carcinoma and gastric signet ring cell adenocarcinoma who is seen for review of PET scan and discussion regarding direction of therapy.  HPI:  The patient was last seen in the medical  oncology clinic on 05/15/2018.   At that time, he felt great.  Weight was stable.  Recent EGD revealed no abnormalities at the GE junction, but new signet ring cell adenocarcinoma in the gastric cardia.  We discussed PET scan followed by endoscopic ultrasound.  PET scan on 05/20/2018 revealed no FDG evidence of esophageal carcinoma.  There was no evidence of distant metastatic disease.  During the interim, he has done well.  He voices no concerns.   Past Medical History:  Diagnosis Date  . Anemia   . Arthritis    hands  . Bradycardia   . Colon polyp   . Diabetes mellitus, type II ([place]) 1986   oral meds  . Elevated PSA 2011   per Dr. Jacqlyn Larsen with Uro no problems last 5 or 6 yrs  . Esophageal cancer ([place]) dx sept 18 2018  . HOH (hard of hearing)    bilateral AIDES  . Hyperlipidemia   . Hypertension 02/02   off bp meds since hospitalization 3 weeks ago  . Hypothyroidism 1980's  . Palpitations   . Right rib fracture 2015  . SVT (supraventricular tachycardia) ([place])    a. reported SVT during admission at [facility] 06/2014  . Syncope and collapse    none recent last few weeks  . Wears dentures    upper and lower    Past Surgical History:  Procedure Laterality Date  . [place], 09/2015   COMPRESSED FRACTURE SPINE  . carotid ultrasound  02/02   wnl  . CATARACT EXTRACTION W/PHACO Right 03/19/2016   Procedure: CATARACT EXTRACTION PHACO AND INTRAOCULAR LENS PLACEMENT (IOC);  Surgeon: Ronnell Freshwater, MD;  Location: [place];  Service: Ophthalmology;  Laterality: Right;  DIABETIC-oral med RIGHT  . CATARACT EXTRACTION W/PHACO  Left 04/23/2016   Procedure: CATARACT EXTRACTION PHACO AND INTRAOCULAR LENS PLACEMENT (IOC);  Surgeon: Ronnell Freshwater, MD;  Location: [place];  Service: Ophthalmology;  Laterality: Left;  LEFT DIABETIC - oral meds  . CHOLECYSTECTOMY  1967  . COLONOSCOPY W/ POLYPECTOMY  2012   Villous adenoma from the rectum without high-grade dysplasia, 10 mm  . COLONOSCOPY WITH PROPOFOL N/A 03/30/2015   Procedure: COLONOSCOPY WITH PROPOFOL;  Surgeon: Robert Bellow, MD;  Location: [facility] ENDOSCOPY;  Service: Endoscopy;  Laterality: N/A;  . CYST EXCISION     thumb  . CYSTECTOMY  06/14/04   mucous cyst excision with left thumb, IP joint debridement  . ESOPHAGOGASTRODUODENOSCOPY (EGD) WITH PROPOFOL N/A 06/11/2017   Procedure: ESOPHAGOGASTRODUODENOSCOPY (EGD) WITH PROPOFOL;  Surgeon: Lucilla Lame, MD;  Location: [facility] ENDOSCOPY;  Service: Endoscopy;  Laterality: N/A;  . ESOPHAGOGASTRODUODENOSCOPY (EGD) WITH PROPOFOL N/A 12/31/2017   Procedure: ESOPHAGOGASTRODUODENOSCOPY (EGD) WITH PROPOFOL;  Surgeon: [name], MD;  Location[facility] ENDOSCOPY;  Service: Endoscopy;  Laterality: N/A;  . ESOPHAGOGASTRODUODENOSCOPY (EGD) WITH PROPOFOL N/A 05/08/2018   Procedure: ESOPHAGOGASTRODUODENOSCOPY (EGD) WITH PROPOFOL;  Surgeon: [name], MD;  Location: [facility] ENDOSCOPY;  Service: Gastroenterology;  Laterality: N/A;  . EUS N/A 07/04/2017   Procedure: UPPER ENDOSCOPIC ULTRASOUND (EUS) RADIAL;  Surgeon: [name], MD;  Location: [facility] ENDOSCOPY;  Service: Endoscopy;  Laterality:  N/A;  . FLEXIBLE SIGMOIDOSCOPY N/A 06/11/2017   Procedure: FLEXIBLE SIGMOIDOSCOPY;  Surgeon: Lucilla Lame, MD;  Location: [facility] ENDOSCOPY;  Service: Endoscopy;  Laterality: N/A;  . HERNIA REPAIR  2013  . KYPHOPLASTY N/A 10/06/2015   Procedure: Thoracic twelve kyphoplasty;  Surgeon: Karie Chimera, MD;  Location: [facility];  Service: Neurosurgery;  Laterality: N/A;  T12 Kyphoplasty  . PROSTATE BIOPSY  11/01/08   Dr. Reece Agar  .  [place]  . VASECTOMY  1975    Family History  Problem Relation Age of Onset  . Cancer Father        liver  . Cancer Sister        breast  . Cancer Brother        skin  . Cancer Sister        breast  . Heart disease Sister        pacer  . Heart disease Sister   . Cancer Sister        breast cancer  . Heart disease Sister   . Colon cancer Neg Hx   . Prostate cancer Neg Hx     Social History:  reports that he has never smoked. He has never used smokeless tobacco. He reports that he does not drink alcohol or use drugs.  Retired Agricultural consultant; worked for [employer] for 44 years.  He was in the [employer].  He denies any exposure to radiation or toxins.  The patient is accompanied by his daughter and wife today.   Allergies:  Allergies  Allergen Reactions  . Flomax [Tamsulosin Hcl] Other (See Comments)    syncope  . Ibuprofen Other (See Comments)    Oral blisters at high doses  . Rosuvastatin     REACTION: muscle stiffness  . Sulfa Antibiotics Rash  . Sulfonamide Derivatives Rash    Current Medications: Current Outpatient Medications  Medication Sig Dispense Refill  . acetaminophen (TYLENOL) 500 MG tablet Take 500-1,000 mg by mouth every 6 (six) hours as needed (for pain.).    Marland Kitchen Ascorbic Acid (VITAMIN C) 1000 MG tablet Take 1,000 mg by mouth daily.    . benzonatate (TESSALON) 100 MG capsule Take 100 mg by mouth at bedtime as needed for cough.    . Chlorpheniramine-DM (CORICIDIN HBP COUGH/COLD PO) Take by mouth 4 (four) times daily as needed.    . diphenhydrAMINE (BENADRYL) 25 mg capsule Take 25 mg by mouth at bedtime as needed (for sleep.).     Marland Kitchen docusate sodium (COLACE) 100 MG capsule Take 100 mg by mouth 2 (two) times daily as needed (for constipation).    Marland Kitchen glimepiride (AMARYL) 1 MG tablet Take 2 tablets (2 mg total) by mouth daily with breakfast. 180 tablet 3  . hydrochlorothiazide (HYDRODIURIL) 12.5 MG tablet Take 1 tablet (12.5 mg total)  by mouth daily. 90 tablet 3  . levothyroxine (SYNTHROID, LEVOTHROID) 75 MCG tablet TAKE 1 TABLET(75 MCG) BY MOUTH DAILY 90 tablet 1  . Multiple Vitamin (MULTIVITAMIN WITH MINERALS) TABS tablet Take 1 tablet by mouth daily. Centrum Silver    . pantoprazole (PROTONIX) 40 MG tablet Take 1 tablet (40 mg total) by mouth 2 (two) times daily. 180 tablet 3  . pravastatin (PRAVACHOL) 80 MG tablet Take 1 tablet (80 mg total) by mouth at bedtime. 90 tablet 3  . pyridOXINE (VITAMIN B-6) 100 MG tablet Take 100 mg by mouth 3 (three) times a week.     Marland Kitchen glucose blood (ONE TOUCH ULTRA  TEST) test strip TO TEST BLOOD SUGAR ONCE OR TWICE DAILY AND AS DIRECTED DX E11.9.  H/o variable blood sugars. 200 each 1  . metFORMIN (GLUCOPHAGE) 500 MG tablet TAKE 1 TABLET BY MOUTH EVERY MORNING AND 2 TABLETS AT SUPPER AND 1/2-1 TABLETS AT NIGHT IF NEEDED 360 tablet 0   No current facility-administered medications for this visit.     Review of Systems  Constitutional: Negative for chills, diaphoresis, fever, malaise/fatigue and weight loss (up 1 pound).       No concerns.  HENT: Positive for hearing loss (occupational). Negative for congestion, ear discharge, nosebleeds and sinus pain.   Eyes: Negative.  Negative for blurred vision, double vision, photophobia, pain, discharge and redness.  Respiratory: Negative.  Negative for cough, hemoptysis, sputum production and shortness of breath.   Cardiovascular: Negative.  Negative for chest pain, palpitations, orthopnea, leg swelling and PND.  Gastrointestinal: Negative.  Negative for abdominal pain, blood in stool, constipation, diarrhea, heartburn, melena, nausea and vomiting.  Genitourinary: Negative.  Negative for dysuria, frequency and hematuria.  Musculoskeletal: Positive for joint pain (arthritis in hands) and myalgias. Negative for back pain, falls and neck pain.  Skin: Negative.  Negative for itching and rash.  Neurological: Negative for dizziness, tingling, tremors,  sensory change, speech change, focal weakness, weakness and headaches.  Endo/Heme/Allergies: Bruises/bleeds easily.       Type II diabetes  Psychiatric/Behavioral: Negative for depression. The patient is not nervous/anxious and does not have insomnia.   All other systems reviewed and are negative.  Performance status (ECOG): 2 - Symptomatic, <50% confined to bed  Physical Exam: Blood pressure (!) 148/73, pulse 74, temperature 97.8 F (36.6 C), temperature source Tympanic, resp. rate 18, weight 152 lb 1.6 oz (69 kg). GENERAL:  Thin elderly gentleman sitting comfortably in the exam room in no acute distress. MENTAL STATUS:  Alert and oriented to person, place and time. HEAD:  Pearline Cables hair.  Normocephalic, atraumatic, face symmetric, no Cushingoid features. EYES:  Glasses.  Blue eyes.  No conjunctivitis or scleral icterus. NEUROLOGICAL: Unremarkable. PSYCH:  Appropriate.    No visits with results within 3 Day(s) from this visit.  Latest known visit with results is:  Hospital Outpatient Visit on 05/20/2018  Component Date Value Ref Range Status  . Glucose-Capillary 05/20/2018 103* 70 - 99 mg/dL Final    Assessment:  Aaron Becker is a 84 y.o. male with clinical stage IIB esophageal cancer (uT2N0).  He presented with an upper GI bleed.  EGD on 06/11/2017 revealed one cratered esophageal ulcer and stigmata of recent esophogeal bleeding at the GE junction.  Biopsy revealed poorly differentiated adenocarcinoma, signet ring type.  There was one non-bleeding cratered gastric ulcer with no stigmata of bleeding in the gastric antrum.  Duodenum was normal.  Colonoscopy on 03/30/2015 revealed a 20 mm tubulovillous adenoma of the rectum.  Flexible sigmoidoscopy on 06/01/2017 revealed a sessile polyp in the rectum and grade II non-bleeding internal hemorrhoids.  PET scan  on 06/27/2017 that revealed no appreciable abnormal esophageal hypermetabolic activity to correspond with the reported tumor. There  was no adenopathy or other findings of metastatic disease. There was question that the original tumor is either low-grade or very small.  Upper endoscopic ultrasound (EUS) on 07/04/2017 revealed a 1-2cm ulcerated mass at the GE junction that was partially circumferential and nonobstructive. The mass is 1 cm in thickness and occupies 3/4 of the  lumen circumference at the GE junction. The mass clearly passes into, but not through the  muscularis propria layer of the esophageal wall. There was no paraesophageal adenopathy. Clinical stage was uT2N0 GE junction adenocarcinoma.  He received 5 weeks of carboplatin and Taxol with radiation (07/29/2017 - 09/03/2017).  Radiation completed on 09-25-202018.  PET scan on 12/04/2017 revealed a small focus of mild uptake (SUV 3.77; background 2.73) localizing to the caudate lobe of liver. There was no corresponding CT abnormality.  Upper endoscopy on 12/31/2017 revealed erythematous mucosa in the esophagus.  Biopsy revealed no malignancy.   EGD on 05/08/2018 revealed a normal esophagus and congested mucosa in the cardia.  GE junction biopsy revealed squamocolumnar mucosa and columnar-lined mucosa with mild chronic inflammation, negative for goblet cells, dysplasia and malignancy. 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  PET scan on 05/20/2018 revealed no FDG evidence of esophageal carcinoma.  There was no evidence of distant metastatic disease.  Symptomatically, he denies any complaint.  Weight is up 1 pound.  Exam is stable.  Plan: 1. Clinical stage IIB esophageal cancer:  Discuss interval PET scan.  Images personally reviewed.  No evidence of metastatic disease.  Discuss referral to GI (Dr. Owens Loffler in [place]) for endoscopic ultrasound and possible resection.  Coordinate care with Mariea Clonts, nurse navigator. 2.  RTC 1 week after procedure for MD assessment and discussion regarding direction  of therapy (family to call).   Lequita Asal, MD  05/23/2018, 4:35 PM

## 2018-06-13 ENCOUNTER — Inpatient Hospital Stay: Payer: Medicare Other | Attending: Hematology and Oncology

## 2018-06-13 ENCOUNTER — Encounter: Payer: Self-pay | Admitting: Hematology and Oncology

## 2018-06-13 ENCOUNTER — Inpatient Hospital Stay (HOSPITAL_BASED_OUTPATIENT_CLINIC_OR_DEPARTMENT_OTHER): Payer: Medicare Other | Admitting: Hematology and Oncology

## 2018-06-13 VITALS — BP 139/74 | HR 79 | Temp 99.3°F | Resp 18 | Wt 153.3 lb

## 2018-06-13 DIAGNOSIS — C169 Malignant neoplasm of stomach, unspecified: Secondary | ICD-10-CM

## 2018-06-13 DIAGNOSIS — Z7189 Other specified counseling: Secondary | ICD-10-CM

## 2018-06-13 DIAGNOSIS — Z9221 Personal history of antineoplastic chemotherapy: Secondary | ICD-10-CM | POA: Diagnosis not present

## 2018-06-13 DIAGNOSIS — C159 Malignant neoplasm of esophagus, unspecified: Secondary | ICD-10-CM

## 2018-06-13 DIAGNOSIS — Z923 Personal history of irradiation: Secondary | ICD-10-CM

## 2018-06-13 DIAGNOSIS — C160 Malignant neoplasm of cardia: Secondary | ICD-10-CM | POA: Diagnosis not present

## 2018-06-13 LAB — CBC WITH DIFFERENTIAL/PLATELET
Basophils Absolute: 0.1 10*3/uL (ref 0–0.1)
Basophils Relative: 1 %
Eosinophils Absolute: 0.1 10*3/uL (ref 0–0.7)
Eosinophils Relative: 1 %
HCT: 35.1 % — ABNORMAL LOW (ref 40.0–52.0)
Hemoglobin: 11.7 g/dL — ABNORMAL LOW (ref 13.0–18.0)
Lymphocytes Relative: 7 %
Lymphs Abs: 0.5 10*3/uL — ABNORMAL LOW (ref 1.0–3.6)
MCH: 29.2 pg (ref 26.0–34.0)
MCHC: 33.3 g/dL (ref 32.0–36.0)
MCV: 87.7 fL (ref 80.0–100.0)
Monocytes Absolute: 0.5 10*3/uL (ref 0.2–1.0)
Monocytes Relative: 7 %
Neutro Abs: 6 10*3/uL (ref 1.4–6.5)
Neutrophils Relative %: 84 %
Platelets: 181 10*3/uL (ref 150–440)
RBC: 4 MIL/uL — ABNORMAL LOW (ref 4.40–5.90)
RDW: 14.9 % — ABNORMAL HIGH (ref 11.5–14.5)
WBC: 7.2 10*3/uL (ref 3.8–10.6)

## 2018-06-13 LAB — COMPREHENSIVE METABOLIC PANEL
ALT: 25 U/L (ref 0–44)
AST: 26 U/L (ref 15–41)
Albumin: 3.9 g/dL (ref 3.5–5.0)
Alkaline Phosphatase: 62 U/L (ref 38–126)
Anion gap: 10 (ref 5–15)
BUN: 27 mg/dL — ABNORMAL HIGH (ref 8–23)
CO2: 25 mmol/L (ref 22–32)
Calcium: 9.5 mg/dL (ref 8.9–10.3)
Chloride: 103 mmol/L (ref 98–111)
Creatinine, Ser: 1.44 mg/dL — ABNORMAL HIGH (ref 0.61–1.24)
GFR calc Af Amer: 50 mL/min — ABNORMAL LOW (ref >60)
GFR calc non Af Amer: 43 mL/min — ABNORMAL LOW (ref >60)
Glucose, Bld: 206 mg/dL — ABNORMAL HIGH (ref 70–99)
Potassium: 4.4 mmol/L (ref 3.5–5.1)
Sodium: 138 mmol/L (ref 135–145)
Total Bilirubin: 0.8 mg/dL (ref 0.3–1.2)
Total Protein: 7.4 g/dL (ref 6.5–8.1)

## 2018-06-13 NOTE — Progress Notes (Signed)
Patient offers no complaints today. 

## 2018-06-13 NOTE — Progress Notes (Signed)
[facility] day:  06/13/2018   Chief Complaint: Aaron Becker is a 84 y.o. male with stage IIB esophageal carcinoma and gastric signet ring cell adenocarcinoma who is seen for 3 week assessment.  HPI:  The patient was last seen in the medical  oncology clinic on 05/23/2018.   At that time, he denied any complaint.  PET scan revealed no evidence of metastatic disease.  Prior EGD revealed signet ring cell adenocarcinoma in the cardia region.   Following his appointment, it as discovered that the biopsy was in the field of radiation.  Discussions were held with interventional GI.  He was not felt to be a candidate for endoscopic resection.  During the interim, patient doing well. He denies any acute concerns. Patient remains active. Patient denies that he has experienced any B symptoms. He denies any interval infections. He denies any dysphagia.   Patient advises that he maintains an adequate appetite. He is eating well. Weight today is 153 lb 5 oz (69.5 kg), which compared to his last visit to the clinic, represents a 1 pound increase.    Patient denies pain in the clinic today.   Past Medical History:  Diagnosis Date  . Anemia   . Arthritis    hands  . Bradycardia   . Colon polyp   . Diabetes mellitus, type II ([place]) 1986   oral meds  . Elevated PSA 2011   per Dr. Jacqlyn Larsen with Uro no problems last 5 or 6 yrs  . Esophageal cancer ([place]) dx sept 18 2018  . HOH (hard of hearing)    bilateral AIDES  . Hyperlipidemia   . Hypertension 02/02   off bp meds since hospitalization 3 weeks ago  . Hypothyroidism 1980's  . Palpitations   . Right rib fracture 2015  . SVT (supraventricular tachycardia) ([place])    a. reported SVT during admission at [facility] 06/2014  . Syncope and collapse    none recent last few weeks  . Wears dentures    upper and lower    Past Surgical History:  Procedure Laterality Date  . [place], 09/2015   COMPRESSED  FRACTURE SPINE  . carotid ultrasound  02/02   [facility]  . CATARACT EXTRACTION W/PHACO Right 03/19/2016   Procedure: CATARACT EXTRACTION PHACO AND INTRAOCULAR LENS PLACEMENT (IOC);  Surgeon: Ronnell Freshwater, MD;  Location: [facility];  Service: Ophthalmology;  Laterality: Right;  DIABETIC-oral med RIGHT  . CATARACT EXTRACTION W/PHACO Left 04/23/2016   Procedure: CATARACT EXTRACTION PHACO AND INTRAOCULAR LENS PLACEMENT (IOC);  Surgeon: Ronnell Freshwater, MD;  Location: [facility];  Service: Ophthalmology;  Laterality: Left;  LEFT DIABETIC - oral meds  . CHOLECYSTECTOMY  1967  . COLONOSCOPY W/ POLYPECTOMY  2012   Villous adenoma from the rectum without high-grade dysplasia, 10 mm  . COLONOSCOPY WITH PROPOFOL N/A 03/30/2015   Procedure: COLONOSCOPY WITH PROPOFOL;  Surgeon: Robert Bellow, MD;  Location: [facility] ENDOSCOPY;  Service: Endoscopy;  Laterality: N/A;  . CYST EXCISION     thumb  . CYSTECTOMY  06/14/04   mucous cyst excision with left thumb, IP joint debridement  . ESOPHAGOGASTRODUODENOSCOPY (EGD) WITH PROPOFOL N/A 06/11/2017   Procedure: ESOPHAGOGASTRODUODENOSCOPY (EGD) WITH PROPOFOL;  Surgeon: Lucilla Lame, MD;  Location: [facility] ENDOSCOPY;  Service: Endoscopy;  Laterality: N/A;  . ESOPHAGOGASTRODUODENOSCOPY (EGD) WITH PROPOFOL N/A 12/31/2017   Procedure: ESOPHAGOGASTRODUODENOSCOPY (EGD) WITH PROPOFOL;  Surgeon: [name], MD;  Location: [facility] ENDOSCOPY;  Service: Endoscopy;  Laterality: N/A;  . ESOPHAGOGASTRODUODENOSCOPY (EGD) WITH PROPOFOL N/A 05/08/2018   Procedure: ESOPHAGOGASTRODUODENOSCOPY (EGD) WITH PROPOFOL;  Surgeon: [name], MD;  Location: [facility] ENDOSCOPY;  Service: Gastroenterology;  Laterality: N/A;  . EUS N/A 07/04/2017   Procedure: UPPER ENDOSCOPIC ULTRASOUND (EUS) RADIAL;  Surgeon: [name], MD;  Location: [facility] ENDOSCOPY;  Service: Endoscopy;  Laterality: N/A;  . FLEXIBLE SIGMOIDOSCOPY N/A 06/11/2017   Procedure: FLEXIBLE SIGMOIDOSCOPY;   Surgeon: Lucilla Lame, MD;  Location: [facility] ENDOSCOPY;  Service: Endoscopy;  Laterality: N/A;  . HERNIA REPAIR  2013  . KYPHOPLASTY N/A 10/06/2015   Procedure: Thoracic twelve kyphoplasty;  Surgeon: Karie Chimera, MD;  Location: [place] NEURO ORS;  Service: Neurosurgery;  Laterality: N/A;  T12 Kyphoplasty  . PROSTATE BIOPSY  11/01/08   Dr. Reece Agar  . [place]  . VASECTOMY  1975    Family History  Problem Relation Age of Onset  . Cancer Father        liver  . Cancer Sister        breast  . Cancer Brother        skin  . Cancer Sister        breast  . Heart disease Sister        pacer  . Heart disease Sister   . Cancer Sister        breast cancer  . Heart disease Sister   . Colon cancer Neg Hx   . Prostate cancer Neg Hx     Social History:  reports that he has never smoked. He has never used smokeless tobacco. He reports that he does not drink alcohol or use drugs.  Retired Agricultural consultant; worked for [employer] for 44 years.  He was in the [employer].  He denies any exposure to radiation or toxins.  The patient is accompanied by his daughter and wife today.   Allergies:  Allergies  Allergen Reactions  . Flomax [Tamsulosin Hcl] Other (See Comments)    syncope  . Ibuprofen Other (See Comments)    Oral blisters at high doses  . Rosuvastatin     REACTION: muscle stiffness  . Sulfa Antibiotics Rash  . Sulfonamide Derivatives Rash    Current Medications: Current Outpatient Medications  Medication Sig Dispense Refill  . acetaminophen (TYLENOL) 500 MG tablet Take 500-1,000 mg by mouth every 6 (six) hours as needed (for pain.).    Marland Kitchen Ascorbic Acid (VITAMIN C) 1000 MG tablet Take 1,000 mg by mouth daily.    . benzonatate (TESSALON) 100 MG capsule Take 100 mg by mouth at bedtime as needed for cough.    . Chlorpheniramine-DM (CORICIDIN HBP COUGH/COLD PO) Take by mouth 4 (four) times daily as needed.    . diphenhydrAMINE (BENADRYL) 25 mg capsule Take 25 mg by  mouth at bedtime as needed (for sleep.).     Marland Kitchen docusate sodium (COLACE) 100 MG capsule Take 100 mg by mouth 2 (two) times daily as needed (for constipation).    Marland Kitchen glimepiride (AMARYL) 1 MG tablet Take 2 tablets (2 mg total) by mouth daily with breakfast. 180 tablet 3  . hydrochlorothiazide (HYDRODIURIL) 12.5 MG tablet Take 1 tablet (12.5 mg total) by mouth daily. 90 tablet 3  . levothyroxine (SYNTHROID, LEVOTHROID) 75 MCG tablet TAKE 1 TABLET(75 MCG) BY MOUTH DAILY 90 tablet 1  . metFORMIN (GLUCOPHAGE) 500 MG tablet TAKE 1 TABLET BY MOUTH EVERY MORNING AND 2 TABLETS AT SUPPER AND 1 TABLETS AT NIGHT IF NEEDED  360 tablet 0  . Multiple Vitamin (MULTIVITAMIN WITH MINERALS) TABS tablet Take 1 tablet by mouth daily. Centrum Silver    . pantoprazole (PROTONIX) 40 MG tablet Take 1 tablet (40 mg total) by mouth 2 (two) times daily. 180 tablet 3  . pravastatin (PRAVACHOL) 80 MG tablet Take 1 tablet (80 mg total) by mouth at bedtime. 90 tablet 3  . pyridOXINE (VITAMIN B-6) 100 MG tablet Take 100 mg by mouth 3 (three) times a week.      No current facility-administered medications for this visit.     Review of Systems  Constitutional: Negative.  Negative for chills, diaphoresis, fever, malaise/fatigue and weight loss (up 1 pound).       Feels good.  Active.  HENT: Positive for hearing loss (chronic). Negative for congestion, ear discharge, ear pain, nosebleeds, sinus pain and sore throat.   Eyes: Negative.  Negative for blurred vision, double vision, photophobia, pain and redness.  Respiratory: Negative.  Negative for cough, hemoptysis, sputum production and shortness of breath.   Cardiovascular: Negative.  Negative for chest pain, palpitations, orthopnea, leg swelling and PND.  Gastrointestinal: Negative.  Negative for abdominal pain, blood in stool, constipation, diarrhea, heartburn, melena, nausea and vomiting.  Genitourinary: Negative.  Negative for dysuria, frequency, hematuria and urgency.   Musculoskeletal: Positive for joint pain (arthritis in hands). Negative for back pain, falls, myalgias and neck pain.  Skin: Negative.  Negative for itching and rash.  Neurological: Negative for dizziness, tingling, tremors, sensory change, speech change, focal weakness, weakness and headaches.  Endo/Heme/Allergies: Does not bruise/bleed easily.       Diabetes  Psychiatric/Behavioral: Negative for depression. The patient is not nervous/anxious and does not have insomnia.   All other systems reviewed and are negative.  Performance status (ECOG): 2 - Symptomatic, <50% confined to bed  Physical Exam: Blood pressure 139/74, pulse 79, temperature 99.3 F (37.4 C), temperature source Tympanic, resp. rate 18, weight 153 lb 5 oz (69.5 kg). GENERAL:  Thin elderly gentleman sitting comfortably in the exam room in no acute distress. MENTAL STATUS:  Alert and oriented to person, place and time. HEAD:  Pearline Cables hair.  Normocephalic, atraumatic, face symmetric, no Cushingoid features. EYES:  Glasses.  Blue eyes.  Pupils equal round and reactive to light and accomodation.  No conjunctivitis or scleral icterus. ENT:  Oropharynx clear without lesion.  Tongue normal. Mucous membranes moist.  RESPIRATORY:  Clear to auscultation without rales, wheezes or rhonchi. CARDIOVASCULAR:  Regular rate and rhythm without murmur, rub or gallop. ABDOMEN:  Soft, non-tender, with active bowel sounds, and no hepatosplenomegaly.  No masses. SKIN:  No rashes, ulcers or lesions. EXTREMITIES: No edema, no skin discoloration or tenderness.  No palpable cords. LYMPH NODES: No palpable cervical, supraclavicular, axillary or inguinal adenopathy  NEUROLOGICAL: Unremarkable. PSYCH:  Appropriate.    Appointment on 06/13/2018  Component Date Value Ref Range Status  . Sodium 06/13/2018 138  135 - 145 mmol/L Final  . Potassium 06/13/2018 4.4  3.5 - 5.1 mmol/L Final  . Chloride 06/13/2018 103  98 - 111 mmol/L Final  . CO2 06/13/2018  25  22 - 32 mmol/L Final  . Glucose, Bld 06/13/2018 206* 70 - 99 mg/dL Final  . BUN 06/13/2018 27* 8 - 23 mg/dL Final  . Creatinine, Ser 06/13/2018 1.44* 0.61 - 1.24 mg/dL Final  . Calcium 06/13/2018 9.5  8.9 - 10.3 mg/dL Final  . Total Protein 06/13/2018 7.4  6.5 - 8.1 g/dL Final  . Albumin 06/13/2018 3.9  3.5 - 5.0 g/dL Final  . AST 06/13/2018 26  15 - 41 U/L Final  . ALT 06/13/2018 25  0 - 44 U/L Final  . Alkaline Phosphatase 06/13/2018 62  38 - 126 U/L Final  . Total Bilirubin 06/13/2018 0.8  0.3 - 1.2 mg/dL Final  . GFR calc non Af Amer 06/13/2018 43* >60 mL/min Final  . GFR calc Af Amer 06/13/2018 50* >60 mL/min Final   Comment: (NOTE) The eGFR has been calculated using the CKD EPI equation. This calculation has not been validated in all clinical situations. eGFR's persistently <60 mL/min signify possible Chronic Kidney Disease.   Georgiann Hahn gap 06/13/2018 10  5 - 15 Final   Performed at [facility], [address] [place], [place] [address]  . WBC 06/13/2018 7.2  3.8 - 10.6 K/uL Final  . RBC 06/13/2018 4.00* 4.40 - 5.90 MIL/uL Final  . Hemoglobin 06/13/2018 11.7* 13.0 - 18.0 g/dL Final  . HCT 06/13/2018 35.1* 40.0 - 52.0 % Final  . MCV 06/13/2018 87.7  80.0 - 100.0 fL Final  . MCH 06/13/2018 29.2  26.0 - 34.0 pg Final  . MCHC 06/13/2018 33.3  32.0 - 36.0 g/dL Final  . RDW 06/13/2018 14.9* 11.5 - 14.5 % Final  . Platelets 06/13/2018 181  150 - 440 K/uL Final  . Neutrophils Relative % 06/13/2018 84  % Final  . Neutro Abs 06/13/2018 6.0  1.4 - 6.5 K/uL Final  . Lymphocytes Relative 06/13/2018 7  % Final  . Lymphs Abs 06/13/2018 0.5* 1.0 - 3.6 K/uL Final  . Monocytes Relative 06/13/2018 7  % Final  . Monocytes Absolute 06/13/2018 0.5  0.2 - 1.0 K/uL Final  . Eosinophils Relative 06/13/2018 1  % Final  . Eosinophils Absolute 06/13/2018 0.1  0 - 0.7 K/uL Final  . Basophils Relative 06/13/2018 1  % Final  . Basophils Absolute 06/13/2018 0.1  0 - 0.1 K/uL Final    Performed at [facility], [address], [place], [place] [address]    Assessment:  Aaron Becker is a 84 y.o. male with clinical stage IIB esophageal cancer (uT2N0).  He presented with an upper GI bleed.  EGD on 06/11/2017 revealed one cratered esophageal ulcer and stigmata of recent esophogeal bleeding at the GE junction.  Biopsy revealed poorly differentiated adenocarcinoma, signet ring type.  There was one non-bleeding cratered gastric ulcer with no stigmata of bleeding in the gastric antrum.  Duodenum was normal.  Colonoscopy on 03/30/2015 revealed a 20 mm tubulovillous adenoma of the rectum.  Flexible sigmoidoscopy on 06/01/2017 revealed a sessile polyp in the rectum and grade II non-bleeding internal hemorrhoids.  PET scan  on 06/27/2017 that revealed no appreciable abnormal esophageal hypermetabolic activity to correspond with the reported tumor. There was no adenopathy or other findings of metastatic disease. There was question that the original tumor is either low-grade or very small.  Upper endoscopic ultrasound (EUS) on 07/04/2017 revealed a 1-2cm ulcerated mass at the GE junction that was partially circumferential and nonobstructive. The mass is 1 cm in thickness and occupies 3/4 of the  lumen circumference at the GE junction. The mass clearly passes into, but not through the muscularis propria layer of the esophageal wall. There was no paraesophageal adenopathy. Clinical stage was uT2N0 GE junction adenocarcinoma.  He received 5 weeks of carboplatin and Taxol with radiation (07/29/2017 - 09/03/2017).  Radiation completed on 01/08/202018.  PET scan on 12/04/2017 revealed a small focus of mild uptake (SUV  3.77; background 2.73) localizing to the caudate lobe of liver. There was no corresponding CT abnormality.  Upper endoscopy on 12/31/2017 revealed erythematous mucosa in the esophagus.  Biopsy revealed no malignancy.   EGD on 05/08/2018 revealed a normal esophagus and congested  mucosa in the cardia.  GE junction biopsy revealed squamocolumnar mucosa and columnar-lined mucosa with mild chronic inflammation, negative for goblet cells, dysplasia and malignancy. 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  PET scan on 05/20/2018 revealed no FDG evidence of esophageal carcinoma.  There was no evidence of distant metastatic disease.  Symptomatically,  He denies any complaints.  Weight has improved.  Exam is stable.  Plan: 1. Labs today:  CBC with diff, CMP. 2. Clinical stage IIB esophageal cancer:  Review findings that gastric biopsy + signet ring cell adenocarcinoma was in field of radiation.  Patient thus not amenable to endoscopic resection.  Discuss treatments with FOLFOX or CAPOX.  Discuss possible immunotherapy (await additional testing).  Send tissue off for MMR/MSI and HER2.  Discuss with Dr Berneice Gandy, [facility] oncologist, regarding possible clinical trial or other options. 3. RTC after testing for MD assessment and further discussions regarding therapy.  Contact patient for appointment.   Honor Loh, NP  06/13/2018, 2:45 PM   I saw and evaluated the patient, participating in the key portions of the service and reviewing pertinent diagnostic studies and records.  I reviewed the nurse practitioner's note and agree with the findings and the plan.  The assessment and plan were discussed with the patient.  Multiple questions were asked by the patient and answered.   Nolon Stalls, MD 06/13/2018,2:45 PM

## 2018-06-14 LAB — CEA: CEA: 4 ng/mL (ref 0.0–4.7)

## 2018-06-20 DIAGNOSIS — C155 Malignant neoplasm of lower third of esophagus: Secondary | ICD-10-CM | POA: Diagnosis not present

## 2018-06-22 IMAGING — CT NM PET TUM IMG INITIAL (PI) SKULL BASE T - THIGH
1 of 9 series · 1 of 25 positions shown · non-contrast
Comparison: CT abdomen 02/14/2011 and chest radiograph 06/14/2017

CLINICAL DATA: Initial treatment strategy for distal esophageal
cancer

EXAM:
NUCLEAR MEDICINE PET SKULL BASE TO THIGH
TECHNIQUE: 12.9 mCi F-18 FDG was injected intravenously. Full-ring PET imaging
was performed from the skull base to thigh after the radiotracer. CT
data was obtained and used for attenuation correction and anatomic
localization.
FASTING BLOOD GLUCOSE:  Value: 85 mg/dl

[Series 3: ct wb 5.0 b30f · axial · 5.0mm · 0.98mm/px · 1 of 290 slices shown]
[im 290/290  brain]
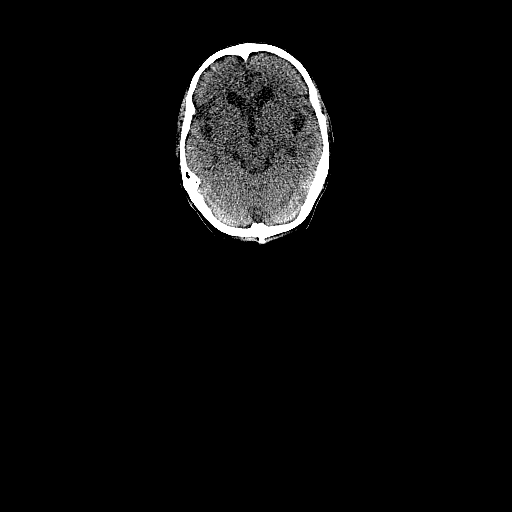

[1 of 25 positions shown; findings below may reference images not displayed]

FINDINGS: NECK

No hypermetabolic lymph nodes in the neck.

Mild chronic right maxillary sinusitis. Cervical spondylosis.
Bilateral mild carotid atherosclerotic calcification.

CHEST

This is arising lack of correlate of hypermetabolic activity in the
distal esophagus. No hypermetabolic esophageal lesion is identified.
No abnormal hypermetabolic lymph nodes or pulmonary nodules in the
chest.

Mild dependent subsegmental atelectasis in both lower lobes.
Coronary, aortic arch, and branch vessel atherosclerotic vascular
disease.

ABDOMEN/PELVIS

Scattered segmental activity throughout the bowel, especially the
descending and sigmoid colon but also elsewhere. Sigmoid colon
diverticulosis.

No abnormal activity in the liver, spleen, pancreas, or adrenal
glands. No hypermetabolic adenopathy identified.

Aortoiliac atherosclerotic vascular disease. Notably enlarged
prostate gland without hypermetabolic activity. Low-level gastric
activity without a well-defined gastric mass.

Small accessory spleen noted. Exophytic photopenic lesion from the
right kidney lower pole compatible with cysts.

SKELETON

No focal hypermetabolic activity to suggest skeletal metastasis.
Cervical, thoracic, and lumbar spondylosis. Degenerative
sternoclavicular arthropathy bilaterally. Prior T12 vertebral
augmentation.
IMPRESSION: 1. There is no appreciable abnormal esophageal hypermetabolic
activity to correspond to the reported tumor. No adenopathy or other
findings of metastatic disease. It may be that the original tumor is
low-grade or very small.
2. Other imaging findings of potential clinical significance: Aortic
Atherosclerosis (9WNIS-8KG.G). Coronary atherosclerosis. Chronic
right maxillary sinusitis. Spondylosis. Considerable Prostatomegaly.
Sigmoid colon diverticulosis.

## 2018-06-25 ENCOUNTER — Encounter: Payer: Self-pay | Admitting: Hematology and Oncology

## 2018-06-25 LAB — SURGICAL PATHOLOGY

## 2018-06-26 ENCOUNTER — Ambulatory Visit (INDEPENDENT_AMBULATORY_CARE_PROVIDER_SITE_OTHER): Payer: Medicare Other

## 2018-06-26 DIAGNOSIS — Z23 Encounter for immunization: Secondary | ICD-10-CM

## 2018-06-26 LAB — SURGICAL PATHOLOGY

## 2018-07-02 ENCOUNTER — Telehealth: Payer: Self-pay

## 2018-07-02 DIAGNOSIS — E119 Type 2 diabetes mellitus without complications: Secondary | ICD-10-CM | POA: Diagnosis not present

## 2018-07-02 MED ORDER — GLUCOSE BLOOD VI STRP: ORAL_STRIP | 1 refills | Status: DC

## 2018-07-02 NOTE — Telephone Encounter (Signed)
I spoke with Joaquim Lai [facility] signed) and advised refilled one touch ultra test strips to [facility] [address]. Joaquim Lai voiced understanding.

## 2018-07-02 NOTE — Telephone Encounter (Signed)
Copied from [place] [id number]. Topic: General - Other >> Jul 02, 2018  8:13 AM Carolyn Stare wrote:  Pt req refill on One Touch Ultra Test Strip. Not showing on his med list and pt has to test his sugar more because of his cancer    [place]

## 2018-07-09 ENCOUNTER — Encounter: Payer: Self-pay | Admitting: Urgent Care

## 2018-07-25 ENCOUNTER — Ambulatory Visit: Payer: Medicare Other | Admitting: Family Medicine

## 2018-07-25 ENCOUNTER — Encounter: Payer: Self-pay | Admitting: Family Medicine

## 2018-07-25 VITALS — BP 160/80 | HR 81 | Temp 97.7°F | Ht 63.0 in | Wt 157.2 lb

## 2018-07-25 DIAGNOSIS — E119 Type 2 diabetes mellitus without complications: Secondary | ICD-10-CM | POA: Diagnosis not present

## 2018-07-25 DIAGNOSIS — C159 Malignant neoplasm of esophagus, unspecified: Secondary | ICD-10-CM

## 2018-07-25 DIAGNOSIS — R413 Other amnesia: Secondary | ICD-10-CM

## 2018-07-25 LAB — POCT GLYCOSYLATED HEMOGLOBIN (HGB A1C): Hemoglobin A1C: 7.1 % — AB (ref 4.0–5.6)

## 2018-07-25 MED ORDER — METFORMIN HCL 500 MG PO TABS: ORAL_TABLET | ORAL | 0 refills | Status: DC

## 2018-07-25 NOTE — Progress Notes (Signed)
Diabetes:  Using medications without difficulties:yes Hypoglycemic episodes: no Hyperglycemic episodes:no Feet problems: no Blood Sugars averaging: 168 this AM, occ higher at the end of the day.  That is okay, to limit hypoglycemia risk at night.  Cautions d/w pt.  eye exam within last year:  D/w pt about routine f/u.   A1c stable.  7.1.     Memory d/w pt. "Fair" per patient report.  Others had noted some changes.  He had asked for some repetition of items in conversation.  He has hearing aids at baseline.  He has a lot of concerns on his mind, with his cancer hx and his daughter having treatment for breast cancer.    Esophageal cancer.  Swallowing well.  He feels good.  No ongoing treatment currently.  He is awaiting input from other docs, I'll defer.    Recheck 160/80, has had lower BP at home on his cuff. D/w pt. he can calibrate his cuff at home and update [place] as needed.  PMH and SH reviewed  Meds, vitals, and allergies reviewed.   ROS: Per HPI unless specifically indicated in ROS section   GEN: nad, alert and oriented 3/3 recall, can do math and read a watch.   HEENT: mucous membranes moist NECK: supple w/o LA CV: rrr. PULM: ctab, no inc wob ABD: soft, +bs EXT: trace BLE edema in stockings.  SKIN: no acute rash

## 2018-07-25 NOTE — Patient Instructions (Signed)
If you BP is still okay at home and with your other docs, then we shouldn't change your medicine.   Recheck in about 3 months.  The only lab you need to have done for your next diabetic visit is an A1c.  We can do this with a fingerstick test at the office visit.  You do not need a lab visit ahead of time for this.  It does not matter if you are fasting when the lab is done.   Take care.  Glad to see you.

## 2018-07-27 NOTE — Assessment & Plan Note (Signed)
He is still seeing oncology.  He is awaiting input from other docs about treatment options at this point.  He does not have ongoing treatment currently.  He still swallowing okay.  He feels well otherwise.  He will update me as needed.

## 2018-07-27 NOTE — Assessment & Plan Note (Addendum)
A1c stable at 7.1.  Hypoglycemia cautions discussed with patient.  No change in meds at this point.  Recheck periodically.  He will update me as needed.  He agrees. >25 minutes spent in face to face time with patient, >50% spent in counselling or coordination of care.

## 2018-07-27 NOTE — Assessment & Plan Note (Signed)
3/3 recall, can do math and read a watch.   No alarming or red flag findings.  He clearly has a lot on his mind with his cancer treatment and also his daughter having breast cancer.  Is likely reasonable to observe for now.  Discussed with patient.  He agrees.

## 2018-08-03 ENCOUNTER — Encounter: Payer: Self-pay | Admitting: Hematology and Oncology

## 2018-08-05 ENCOUNTER — Telehealth: Payer: Self-pay

## 2018-08-05 NOTE — Telephone Encounter (Signed)
Received call from friend, Manus Gunning, regarding plan of care going forward. Message sent to Dr. Mike Gip and Atlee Abide NP regarding. Oncology Nurse Navigator Documentation  Navigator Location: [facility] (08/05/18 1400)   )Navigator Encounter Type: Telephone (08/05/18 1400) Telephone: Patient Update;Incoming Call (08/05/18 1400)                                                  Time Spent with Patient: 15 (08/05/18 1400)

## 2018-08-11 ENCOUNTER — Telehealth: Payer: Self-pay

## 2018-08-11 NOTE — Telephone Encounter (Signed)
I called and spoke with Aaron Becker. I have given her updates on possible EGFR testing. Dr. Mike Gip has been in contact with physicians at [facility]. [name] is very content at this time. They report at almost 84 years old he is taking it one day at a time. He is eating okay and doing fairly well. I will keep them updated as we have more information. Oncology Nurse Navigator Documentation  Navigator Location: [facility] (08/11/18 1100)   )Navigator Encounter Type: Telephone (08/11/18 1100) Telephone: Patient Update (08/11/18 1100)                                                  Time Spent with Patient: 15 (08/11/18 1100)

## 2018-08-14 ENCOUNTER — Ambulatory Visit: Payer: Medicare Other | Admitting: Radiation Oncology

## 2018-08-15 ENCOUNTER — Encounter: Payer: Self-pay | Admitting: Radiation Oncology

## 2018-08-15 ENCOUNTER — Ambulatory Visit
Admission: RE | Admit: 2018-08-15 | Discharge: 2018-08-15 | Disposition: A | Discharge status: Home / Self Care | Payer: Medicare Other | Source: Ambulatory Visit | Attending: Radiation Oncology | Admitting: Radiation Oncology

## 2018-08-15 ENCOUNTER — Other Ambulatory Visit: Payer: Self-pay

## 2018-08-15 DIAGNOSIS — Z9221 Personal history of antineoplastic chemotherapy: Secondary | ICD-10-CM | POA: Diagnosis not present

## 2018-08-15 DIAGNOSIS — C155 Malignant neoplasm of lower third of esophagus: Secondary | ICD-10-CM | POA: Insufficient documentation

## 2018-08-15 DIAGNOSIS — C159 Malignant neoplasm of esophagus, unspecified: Secondary | ICD-10-CM

## 2018-08-15 DIAGNOSIS — Z923 Personal history of irradiation: Secondary | ICD-10-CM | POA: Diagnosis not present

## 2018-08-15 NOTE — Progress Notes (Signed)
Radiation Oncology Follow up Note  Name: Aaron Becker   Date:   08/15/2018 MRN:  [id number] DOB: 03/21/1930    This 84 y.o. male presents to the clinic today for 11 month follow-up status post concurrent chemotherapy radiation therapy for stage IIB adenocarcinoma the distal esophagus.  REFERRING PROVIDER: Tonia Ghent, MD  HPI: patient is an 84 year old male now out 11 months having completed concurrent concurrent chemoradiation therapy for stage IIB adenocarcinoma distal esophagus. He is seen today in routine follow-up is doing fairly well he is asymptomatic no dysphagia. 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 Currently molecular profiling was being investigated for possible immunotherapy. Again he continues to be asymptomatic.  COMPLICATIONS OF TREATMENT: none  FOLLOW UP COMPLIANCE: keeps appointments   PHYSICAL EXAM:  BP (!) (P) 159/83 (BP Location: Left Arm, Patient Position: Sitting)   Pulse (P) 77   Temp (!) (P) 97.3 F (36.3 C) (Tympanic)   Resp (P) 18   Wt (P) 156 lb 1.4 oz (70.8 kg)   BMI (P) 27.65 kg/m  Elderly male wheelchair-bound in NAD.Well-developed well-nourished patient in NAD. HEENT reveals PERLA, EOMI, discs not visualized.  Oral cavity is clear. No oral mucosal lesions are identified. Neck is clear without evidence of cervical or supraclavicular adenopathy. Lungs are clear to A&P. Cardiac examination is essentially unremarkable with regular rate and rhythm without murmur rub or thrill. Abdomen is benign with no organomegaly or masses noted. Motor sensory and DTR levels are equal and symmetric in the upper and lower extremities. Cranial nerves II through XII are grossly intact. Proprioception is intact. No peripheral adenopathy or edema is identified. No motor or sensory  levels are noted. Crude visual fields are within normal range.  RADIOLOGY RESULTS: PET scan reviewed and compatible with the above-stated findings showing no evidence of gross disease  PLAN: at this time I'm sending patient for follow-up Dr. Mike Gip for discussion of possible further intervention. Based on the fact there is no gross disease no hypermetabolic activity on PET scan do not feel radiation therapy is indicated. Patient may benefit from immunotherapy at this time. I have asked to see him back in 6 months for follow-up. Patient and wife knows to call with any concerns.  I would like to take this opportunity to thank you for allowing me to participate in the care of your patient.Noreene Filbert, MD

## 2018-08-20 ENCOUNTER — Telehealth: Payer: Self-pay | Admitting: Hematology and Oncology

## 2018-08-20 NOTE — Telephone Encounter (Signed)
Re:  Pathology  Discussed that there is not enough tissue in the biopsies to perform Foundation One for EGFR. Testing already complete on PDL1 and MMR/MSI.  Discussed possible chemotherapy. Discuss re: biopsy for additional material for testing.  He has mentioned in the past that he was not interested in further chemotherapy. I offered consultation with Dr. Karmen Stabs.  Leodis Sias (significant other) will discuss these issues with the family and Mr Brayfield. They will call back after the Thanksgiving holiday.   Lequita Asal, MD

## 2018-08-22 ENCOUNTER — Other Ambulatory Visit: Payer: Self-pay | Admitting: Family Medicine

## 2018-08-26 ENCOUNTER — Inpatient Hospital Stay: Payer: Medicare Other | Attending: Hematology and Oncology | Admitting: Hematology and Oncology

## 2018-08-26 ENCOUNTER — Encounter: Payer: Self-pay | Admitting: Hematology and Oncology

## 2018-08-26 VITALS — BP 165/74 | HR 72 | Temp 97.6°F | Resp 18 | Ht 63.0 in | Wt 161.5 lb

## 2018-08-26 DIAGNOSIS — Z79899 Other long term (current) drug therapy: Secondary | ICD-10-CM | POA: Diagnosis not present

## 2018-08-26 DIAGNOSIS — M1990 Unspecified osteoarthritis, unspecified site: Secondary | ICD-10-CM | POA: Diagnosis not present

## 2018-08-26 DIAGNOSIS — C169 Malignant neoplasm of stomach, unspecified: Secondary | ICD-10-CM

## 2018-08-26 DIAGNOSIS — Z923 Personal history of irradiation: Secondary | ICD-10-CM | POA: Diagnosis not present

## 2018-08-26 DIAGNOSIS — I10 Essential (primary) hypertension: Secondary | ICD-10-CM | POA: Diagnosis not present

## 2018-08-26 DIAGNOSIS — Z9221 Personal history of antineoplastic chemotherapy: Secondary | ICD-10-CM

## 2018-08-26 DIAGNOSIS — E119 Type 2 diabetes mellitus without complications: Secondary | ICD-10-CM | POA: Diagnosis not present

## 2018-08-26 DIAGNOSIS — C159 Malignant neoplasm of esophagus, unspecified: Secondary | ICD-10-CM

## 2018-08-26 DIAGNOSIS — C160 Malignant neoplasm of cardia: Secondary | ICD-10-CM | POA: Diagnosis not present

## 2018-08-26 DIAGNOSIS — E785 Hyperlipidemia, unspecified: Secondary | ICD-10-CM

## 2018-08-26 DIAGNOSIS — Z7984 Long term (current) use of oral hypoglycemic drugs: Secondary | ICD-10-CM | POA: Insufficient documentation

## 2018-08-26 DIAGNOSIS — Z7189 Other specified counseling: Secondary | ICD-10-CM

## 2018-08-26 NOTE — Progress Notes (Signed)
[facility] day:  08/26/2018   Chief Complaint: Aaron Becker is a 84 y.o. male with stage IIB esophageal carcinoma and gastric signet ring cell adenocarcinoma who is seen for 2 month assessment.  HPI:  The patient was last seen in the medical  oncology clinic on 06/13/2018.   At that time, he denied any complaints.  Weight had improved.  Exam was stable.  Gastric biopsy revealed signet ring cell adenocarcinoma in the field of radiation.  Tumor was not amenable to endoscopic resection.  We discussed treatments with FOLFOX or CAPOX.  Additional testing was pending to determine if he was a candidate for immunotherapy.  PDL-1 testing revealed a CPS score of 2 (expressed).  MMR/MSI was intact.  Her2/neu was 1+.  There was not enough tissue for EGFR testing for Foundation One.  During the interim, he has done well.  He notes "no problems".  He denies any abdominal symptoms.  He is eating well and gaining weight.   Past Medical History:  Diagnosis Date  . Anemia   . Arthritis    hands  . Bradycardia   . Colon polyp   . Diabetes mellitus, type II ([place]) 1986   oral meds  . Elevated PSA 2011   per Dr. Jacqlyn Larsen with Uro no problems last 5 or 6 yrs  . Esophageal cancer ([place]) dx sept 18 2018  . HOH (hard of hearing)    bilateral AIDES  . Hyperlipidemia   . Hypertension 02/02   off bp meds since hospitalization 3 weeks ago  . Hypothyroidism 1980's  . Palpitations   . Right rib fracture 2015  . SVT (supraventricular tachycardia) ([place])    a. reported SVT during admission at [facility] 06/2014  . Syncope and collapse    none recent last few weeks  . Wears dentures    upper and lower    Past Surgical History:  Procedure Laterality Date  . [place], 09/2015   COMPRESSED FRACTURE SPINE  . carotid ultrasound  02/02   wnl  . CATARACT EXTRACTION W/PHACO Right 03/19/2016   Procedure: CATARACT EXTRACTION PHACO AND INTRAOCULAR LENS PLACEMENT (IOC);   Surgeon: Ronnell Freshwater, MD;  Location: [place];  Service: Ophthalmology;  Laterality: Right;  DIABETIC-oral med RIGHT  . CATARACT EXTRACTION W/PHACO Left 04/23/2016   Procedure: CATARACT EXTRACTION PHACO AND INTRAOCULAR LENS PLACEMENT (IOC);  Surgeon: Ronnell Freshwater, MD;  Location: [place];  Service: Ophthalmology;  Laterality: Left;  LEFT DIABETIC - oral meds  . CHOLECYSTECTOMY  1967  . COLONOSCOPY W/ POLYPECTOMY  2012   Villous adenoma from the rectum without high-grade dysplasia, 10 mm  . COLONOSCOPY WITH PROPOFOL N/A 03/30/2015   Procedure: COLONOSCOPY WITH PROPOFOL;  Surgeon: Robert Bellow, MD;  Location: [facility] ENDOSCOPY;  Service: Endoscopy;  Laterality: N/A;  . CYST EXCISION     thumb  . CYSTECTOMY  06/14/04   mucous cyst excision with left thumb, IP joint debridement  . ESOPHAGOGASTRODUODENOSCOPY (EGD) WITH PROPOFOL N/A 06/11/2017   Procedure: ESOPHAGOGASTRODUODENOSCOPY (EGD) WITH PROPOFOL;  Surgeon: Lucilla Lame, MD;  Location: [facility] ENDOSCOPY;  Service: Endoscopy;  Laterality: N/A;  . ESOPHAGOGASTRODUODENOSCOPY (EGD) WITH PROPOFOL N/A 12/31/2017   Procedure: ESOPHAGOGASTRODUODENOSCOPY (EGD) WITH PROPOFOL;  Surgeon: [name], MD;  Location: [facility] ENDOSCOPY;  Service: Endoscopy;  Laterality: N/A;  . ESOPHAGOGASTRODUODENOSCOPY (EGD) WITH PROPOFOL N/A 05/08/2018   Procedure: ESOPHAGOGASTRODUODENOSCOPY (EGD) WITH PROPOFOL;  Surgeon: [name], MD;  Location: [facility]  ENDOSCOPY;  Service: Gastroenterology;  Laterality: N/A;  . EUS N/A 07/04/2017   Procedure: UPPER ENDOSCOPIC ULTRASOUND (EUS) RADIAL;  Surgeon: [name], MD;  Location: [facility] ENDOSCOPY;  Service: Endoscopy;  Laterality: N/A;  . FLEXIBLE SIGMOIDOSCOPY N/A 06/11/2017   Procedure: FLEXIBLE SIGMOIDOSCOPY;  Surgeon: Lucilla Lame, MD;  Location: [facility] ENDOSCOPY;  Service: Endoscopy;  Laterality: N/A;  . HERNIA REPAIR  2013  . KYPHOPLASTY N/A 10/06/2015   Procedure: Thoracic twelve  kyphoplasty;  Surgeon: Karie Chimera, MD;  Location: [place] NEURO ORS;  Service: Neurosurgery;  Laterality: N/A;  T12 Kyphoplasty  . PROSTATE BIOPSY  11/01/08   Dr. Reece Agar  . [place]  . VASECTOMY  1975    Family History  Problem Relation Age of Onset  . Cancer Father        liver  . Cancer Sister        breast  . Cancer Brother        skin  . Cancer Sister        breast  . Heart disease Sister        pacer  . Heart disease Sister   . Cancer Sister        breast cancer  . Heart disease Sister   . Colon cancer Neg Hx   . Prostate cancer Neg Hx     Social History:  reports that he has never smoked. He has never used smokeless tobacco. He reports that he does not drink alcohol or use drugs.  Retired Agricultural consultant; worked for [employer] for 44 years.  He was in the [employer].  He denies any exposure to radiation or toxins.  The patient is accompanied by his son, daughter, and girlfriend Joaquim Lai), today.   Allergies:  Allergies  Allergen Reactions  . Flomax [Tamsulosin Hcl] Other (See Comments)    syncope  . Ibuprofen Other (See Comments)    Oral blisters at high doses  . Rosuvastatin     REACTION: muscle stiffness  . Sulfa Antibiotics Rash  . Sulfonamide Derivatives Rash    Current Medications: Current Outpatient Medications  Medication Sig Dispense Refill  . Ascorbic Acid (VITAMIN C) 1000 MG tablet Take 1,000 mg by mouth daily.    . Chlorpheniramine-DM (CORICIDIN HBP COUGH/COLD PO) Take by mouth 4 (four) times daily as needed.    . diphenhydrAMINE (BENADRYL) 25 mg capsule Take 25 mg by mouth at bedtime as needed (for sleep.).     Marland Kitchen glimepiride (AMARYL) 1 MG tablet Take 2 tablets (2 mg total) by mouth daily with breakfast. 180 tablet 3  . glucose blood (ONE TOUCH ULTRA TEST) test strip TO TEST BLOOD SUGAR ONCE OR TWICE DAILY AND AS DIRECTED DX E11.9.  H/o variable blood sugars. 200 each 1  . hydrochlorothiazide (HYDRODIURIL) 12.5 MG tablet  Take 1 tablet (12.5 mg total) by mouth daily. 90 tablet 3  . levothyroxine (SYNTHROID, LEVOTHROID) 75 MCG tablet TAKE 1 TABLET(75 MCG) BY MOUTH DAILY 90 tablet 1  . Multiple Vitamin (MULTIVITAMIN WITH MINERALS) TABS tablet Take 1 tablet by mouth daily. Centrum Silver    . pantoprazole (PROTONIX) 40 MG tablet Take 1 tablet (40 mg total) by mouth 2 (two) times daily. 180 tablet 3  . pravastatin (PRAVACHOL) 80 MG tablet Take 1 tablet (80 mg total) by mouth at bedtime. 90 tablet 3  . pyridOXINE (VITAMIN B-6) 100 MG tablet Take 100 mg by mouth 3 (three) times a week.     Marland Kitchen acetaminophen (TYLENOL)  500 MG tablet Take 500-1,000 mg by mouth every 6 (six) hours as needed (for pain.).    Marland Kitchen benzonatate (TESSALON) 100 MG capsule Take 100 mg by mouth at bedtime as needed for cough.    . docusate sodium (COLACE) 100 MG capsule Take 100 mg by mouth 2 (two) times daily as needed (for constipation).    . metFORMIN (GLUCOPHAGE) 500 MG tablet TAKE 1 TABLET BY MOUTH EVERY MORNING AND 2 TABLETS AT SUPPER AND 1 TABLETS AT NIGHT IF NEEDED 360 tablet 0   No current facility-administered medications for this visit.     Review of Systems  Constitutional: Negative.  Negative for chills, diaphoresis, fever, malaise/fatigue and weight loss (up 8 pounds).       No problems.  "Seems like everything is fine".  HENT: Positive for hearing loss (chronic). Negative for congestion, ear discharge, ear pain, nosebleeds, sinus pain and sore throat.   Eyes: Negative.  Negative for blurred vision, double vision, photophobia, pain, discharge and redness.  Respiratory: Negative.  Negative for cough, hemoptysis, sputum production and shortness of breath.   Cardiovascular: Negative.  Negative for chest pain, palpitations, orthopnea, leg swelling and PND.  Gastrointestinal: Negative.  Negative for abdominal pain, blood in stool, constipation, diarrhea, heartburn, melena, nausea and vomiting.       Eating well and gaining weight.   Genitourinary: Negative.  Negative for dysuria, frequency, hematuria and urgency.  Musculoskeletal: Positive for joint pain (arthritis in hands). Negative for back pain, falls, myalgias and neck pain.  Skin: Negative.  Negative for itching and rash.  Neurological: Negative.  Negative for dizziness, tingling, tremors, sensory change, speech change, focal weakness, weakness and headaches.  Endo/Heme/Allergies: Does not bruise/bleed easily.       Diabetes.  Psychiatric/Behavioral: Negative for depression. The patient is not nervous/anxious and does not have insomnia.   All other systems reviewed and are negative.  Performance status (ECOG): 1  Physical Exam: Blood pressure (!) 165/74, pulse 72, temperature 97.6 F (36.4 C), temperature source Tympanic, resp. rate 18, height 5' 3"  (1.6 m), weight 161 lb 8 oz (73.3 kg), SpO2 98 %. GENERAL:  Well developed, well nourished, elderly gentleman sitting comfortably in the exam room in no acute distress. MENTAL STATUS:  Alert and oriented to person, place and time. HEAD:  Pearline Cables hair.  Normocephalic, atraumatic, face symmetric, no Cushingoid features. EYES:  Glasses.  Blue eyes.  Pupils equal round and reactive to light and accomodation.  No conjunctivitis or scleral icterus. ENT:  Oropharynx clear without lesion.  Tongue normal. Mucous membranes moist.  RESPIRATORY:  Clear to auscultation without rales, wheezes or rhonchi. CARDIOVASCULAR:  Regular rate and rhythm without murmur, rub or gallop. ABDOMEN:  Soft, non-tender, with active bowel sounds, and no hepatosplenomegaly.  No masses. SKIN:  No rashes, ulcers or lesions. EXTREMITIES: Trace right and 1+ left ankle edema.  No skin discoloration or tenderness.  No palpable cords. LYMPH NODES: No palpable cervical, supraclavicular, axillary or inguinal adenopathy  NEUROLOGICAL: Unremarkable. PSYCH:  Appropriate.    No visits with results within 3 Day(s) from this visit.  Latest known visit with  results is:  Office Visit on 07/25/2018  Component Date Value Ref Range Status  . Hemoglobin A1C 07/25/2018 7.1* 4.0 - 5.6 % Final    Assessment:  Aaron Becker is a 84 y.o. male with clinical stage IIB esophageal cancer (uT2N0).  He presented with an upper GI bleed.  EGD on 06/11/2017 revealed one cratered esophageal ulcer and stigmata of  recent esophogeal bleeding at the GE junction.  Biopsy revealed poorly differentiated adenocarcinoma, signet ring type.  There was one non-bleeding cratered gastric ulcer with no stigmata of bleeding in the gastric antrum.  Duodenum was normal.  Colonoscopy on 03/30/2015 revealed a 20 mm tubulovillous adenoma of the rectum.  Flexible sigmoidoscopy on 06/01/2017 revealed a sessile polyp in the rectum and grade II non-bleeding internal hemorrhoids.  PET scan  on 06/27/2017 that revealed no appreciable abnormal esophageal hypermetabolic activity to correspond with the reported tumor. There was no adenopathy or other findings of metastatic disease. There was question that the original tumor is either low-grade or very small.  Upper endoscopic ultrasound (EUS) on 07/04/2017 revealed a 1-2cm ulcerated mass at the GE junction that was partially circumferential and nonobstructive. The mass is 1 cm in thickness and occupies 3/4 of the  lumen circumference at the GE junction. The mass clearly passes into, but not through the muscularis propria layer of the esophageal wall. There was no paraesophageal adenopathy. Clinical stage was uT2N0 GE junction adenocarcinoma.  He received 5 weeks of carboplatin and Taxol with radiation (07/29/2017 - 09/03/2017).  Radiation completed on Jul 30, 202018.  PET scan on 12/04/2017 revealed a small focus of mild uptake (SUV 3.77; background 2.73) localizing to the caudate lobe of liver. There was no corresponding CT abnormality.  Upper endoscopy on 12/31/2017 revealed erythematous mucosa in the esophagus.  Biopsy revealed no malignancy.    EGD on 05/08/2018 revealed a normal esophagus and congested mucosa in the cardia.  GE junction biopsy revealed squamocolumnar mucosa and columnar-lined mucosa with mild chronic inflammation, negative for goblet cells, dysplasia and malignancy. 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  PET scan on 05/20/2018 revealed no FDG evidence of esophageal carcinoma.  There was no evidence of distant metastatic disease.  PDL-1 testing revealed a CPS score of 2 (expressed).  MMR/MSI was intact.  Her2/neu was 1+.  There was not enough tissue for EGFR testing for Foundation One.  Symptomatically, he feels well.  He is eating well.  He is gaining weight.  Exam reveals trace-1+ ankle edema.  Plan: 1.   Clinical stage IIB esophageal cancer:  Clinically doing well without symptoms.  Endoscopic ultrasound reveals persistent disease in the gastric cardia in the prior field of radiation.  Patient not amendable to endoscopic resection or definitive surgery.  Review PDL-1 testing- CPS 2 (expressed).  Patient would be a candidate for pembrolizumab now if score 10.  He is a candidate for 3rd line Keytruda.  MMR/MSI was negative.  Not enough tissue for EGFR testing.  Discuss potential for repeat endoscopic procedure for additional tissue.  Review prior conversation about systemic chemotherapy (5FU/LV, FOLFOX, CAPOX).  Side effects reviewed.  Discuss consideration of referral to Dr. Karmen Stabs at [facility] for second opinion.  Patient in agreement.  If treatment recommended, he wishes to wait after holidays. 2.   Referral to [facility] at [place], 3.   RTC in early January in [place] for MD assessment, labs (CBC with diff, CMP), and discussion regaring direction of therapy.   A total of (> 20) minutes of face-to-face time was spent with the patient with greater than 50% of that time in counseling and care-coordination.    Lequita Asal, MD  08/26/2018, 4:35  PM

## 2018-08-26 NOTE — Progress Notes (Signed)
No new changes noted today 

## 2018-09-04 ENCOUNTER — Other Ambulatory Visit: Payer: Self-pay | Admitting: Family Medicine

## 2018-09-19 ENCOUNTER — Other Ambulatory Visit: Payer: Self-pay | Admitting: Family Medicine

## 2018-09-29 ENCOUNTER — Telehealth: Payer: Self-pay

## 2018-09-30 ENCOUNTER — Inpatient Hospital Stay: Payer: Medicare Other | Attending: Hematology and Oncology

## 2018-09-30 ENCOUNTER — Inpatient Hospital Stay (HOSPITAL_BASED_OUTPATIENT_CLINIC_OR_DEPARTMENT_OTHER): Payer: Medicare Other | Admitting: Hematology and Oncology

## 2018-09-30 ENCOUNTER — Encounter: Payer: Self-pay | Admitting: Hematology and Oncology

## 2018-09-30 VITALS — BP 183/81 | HR 68 | Temp 98.2°F | Resp 18 | Ht 63.0 in | Wt 159.3 lb

## 2018-09-30 DIAGNOSIS — Z923 Personal history of irradiation: Secondary | ICD-10-CM | POA: Insufficient documentation

## 2018-09-30 DIAGNOSIS — R609 Edema, unspecified: Secondary | ICD-10-CM | POA: Diagnosis not present

## 2018-09-30 DIAGNOSIS — Z803 Family history of malignant neoplasm of breast: Secondary | ICD-10-CM | POA: Insufficient documentation

## 2018-09-30 DIAGNOSIS — C159 Malignant neoplasm of esophagus, unspecified: Secondary | ICD-10-CM

## 2018-09-30 DIAGNOSIS — C160 Malignant neoplasm of cardia: Secondary | ICD-10-CM

## 2018-09-30 DIAGNOSIS — Z9221 Personal history of antineoplastic chemotherapy: Secondary | ICD-10-CM | POA: Diagnosis not present

## 2018-09-30 DIAGNOSIS — I10 Essential (primary) hypertension: Secondary | ICD-10-CM | POA: Insufficient documentation

## 2018-09-30 DIAGNOSIS — Z800 Family history of malignant neoplasm of digestive organs: Secondary | ICD-10-CM

## 2018-09-30 DIAGNOSIS — Z808 Family history of malignant neoplasm of other organs or systems: Secondary | ICD-10-CM | POA: Insufficient documentation

## 2018-09-30 DIAGNOSIS — E119 Type 2 diabetes mellitus without complications: Secondary | ICD-10-CM

## 2018-09-30 DIAGNOSIS — Z7189 Other specified counseling: Secondary | ICD-10-CM

## 2018-09-30 LAB — COMPREHENSIVE METABOLIC PANEL
ALT: 25 U/L (ref 0–44)
AST: 27 U/L (ref 15–41)
Albumin: 3.7 g/dL (ref 3.5–5.0)
Alkaline Phosphatase: 55 U/L (ref 38–126)
Anion gap: 6 (ref 5–15)
BUN: 24 mg/dL — ABNORMAL HIGH (ref 8–23)
CO2: 27 mmol/L (ref 22–32)
Calcium: 9.3 mg/dL (ref 8.9–10.3)
Chloride: 107 mmol/L (ref 98–111)
Creatinine, Ser: 1.5 mg/dL — ABNORMAL HIGH (ref 0.61–1.24)
GFR calc Af Amer: 48 mL/min — ABNORMAL LOW (ref >60)
GFR calc non Af Amer: 42 mL/min — ABNORMAL LOW (ref >60)
Glucose, Bld: 176 mg/dL — ABNORMAL HIGH (ref 70–99)
Potassium: 4 mmol/L (ref 3.5–5.1)
Sodium: 140 mmol/L (ref 135–145)
Total Bilirubin: 0.8 mg/dL (ref 0.3–1.2)
Total Protein: 6.7 g/dL (ref 6.5–8.1)

## 2018-09-30 LAB — CBC WITH DIFFERENTIAL/PLATELET
Abs Immature Granulocytes: 0.03 10*3/uL (ref 0.00–0.07)
Basophils Absolute: 0 10*3/uL (ref 0.0–0.1)
Basophils Relative: 1 %
Eosinophils Absolute: 0.1 10*3/uL (ref 0.0–0.5)
Eosinophils Relative: 2 %
HCT: 32.4 % — ABNORMAL LOW (ref 39.0–52.0)
Hemoglobin: 10.3 g/dL — ABNORMAL LOW (ref 13.0–17.0)
Immature Granulocytes: 1 %
Lymphocytes Relative: 6 %
Lymphs Abs: 0.4 10*3/uL — ABNORMAL LOW (ref 0.7–4.0)
MCH: 27.6 pg (ref 26.0–34.0)
MCHC: 31.8 g/dL (ref 30.0–36.0)
MCV: 86.9 fL (ref 80.0–100.0)
Monocytes Absolute: 0.5 10*3/uL (ref 0.1–1.0)
Monocytes Relative: 8 %
Neutro Abs: 5.4 10*3/uL (ref 1.7–7.7)
Neutrophils Relative %: 82 %
Platelets: 145 10*3/uL — ABNORMAL LOW (ref 150–400)
RBC: 3.73 MIL/uL — ABNORMAL LOW (ref 4.22–5.81)
RDW: 15 % (ref 11.5–15.5)
WBC: 6.5 10*3/uL (ref 4.0–10.5)
nRBC: 0 % (ref 0.0–0.2)

## 2018-09-30 NOTE — Progress Notes (Signed)
Provided written directions to [facility] and parking provided.

## 2018-09-30 NOTE — Progress Notes (Signed)
No new changes noted today 

## 2018-09-30 NOTE — Progress Notes (Signed)
[place] [facility] day:  09/30/2018    Chief Complaint: Aaron Becker is a 85 y.o. male with stage IIB esophageal carcinoma and gastric signet ring cell adenocarcinoma who is seen for 1 month assessment.  HPI:  The patient was last seen in the medical  oncology clinic on 08/26/2018.   At that time, he felt well.  He was eating well.  He was gaining weight.  Exam revealed trace-1+ ankle edema.  We discussed consideration of systemic chemotherapy.  We discussed second opinion at [facility].  He wished to wait for any treatment until after the holidays.  Patient is scheduled to see Dr. Karmen Stabs at [facility] this week on Thursday for a consultation visit only. He has made the decision that he wants to "just let things ride out" without any further treatment, however he is open to attending [facility] consult.   During the interim, patient is fatigued. He is spending most of his days sleeping at this point. He maintains the ability to independently complete all of his ADLs. Patient denies that he has experienced any B symptoms. He denies any interval infections.   Patient advises that he maintains an adequate appetite. He is eating well. Weight today is 159 lb 4.8 oz (72.3 kg), which compared to his last visit to the clinic, represents a 2 pound decrease.   Patient denies pain in the clinic today.   Past Medical History:  Diagnosis Date  . Anemia   . Arthritis    hands  . Bradycardia   . Colon polyp   . Diabetes mellitus, type II ([place]) 1986   oral meds  . Elevated PSA 2011   per Dr. Jacqlyn Larsen with Uro no problems last 5 or 6 yrs  . Esophageal cancer ([place]) dx sept 18 2018  . HOH (hard of hearing)    bilateral AIDES  . Hyperlipidemia   . Hypertension 02/02   off bp meds since hospitalization 3 weeks ago  . Hypothyroidism 1980's  . Palpitations   . Right rib fracture 2015  . SVT (supraventricular tachycardia) ([place])    a. reported SVT during admission at [facility] 06/2014   . Syncope and collapse    none recent last few weeks  . Wears dentures    upper and lower    Past Surgical History:  Procedure Laterality Date  . [place], 09/2015   COMPRESSED FRACTURE SPINE  . carotid ultrasound  02/02   wnl  . CATARACT EXTRACTION W/PHACO Right 03/19/2016   Procedure: CATARACT EXTRACTION PHACO AND INTRAOCULAR LENS PLACEMENT (IOC);  Surgeon: Ronnell Freshwater, MD;  Location: [place];  Service: Ophthalmology;  Laterality: Right;  DIABETIC-oral med RIGHT  . CATARACT EXTRACTION W/PHACO Left 04/23/2016   Procedure: CATARACT EXTRACTION PHACO AND INTRAOCULAR LENS PLACEMENT (IOC);  Surgeon: Ronnell Freshwater, MD;  Location: [place];  Service: Ophthalmology;  Laterality: Left;  LEFT DIABETIC - oral meds  . CHOLECYSTECTOMY  1967  . COLONOSCOPY W/ POLYPECTOMY  2012   Villous adenoma from the rectum without high-grade dysplasia, 10 mm  . COLONOSCOPY WITH PROPOFOL N/A 03/30/2015   Procedure: COLONOSCOPY WITH PROPOFOL;  Surgeon: Robert Bellow, MD;  Location: [facility] ENDOSCOPY;  Service: Endoscopy;  Laterality: N/A;  . CYST EXCISION     thumb  . CYSTECTOMY  06/14/04   mucous cyst excision with left thumb, IP joint debridement  . ESOPHAGOGASTRODUODENOSCOPY (EGD) WITH PROPOFOL N/A 06/11/2017   Procedure: ESOPHAGOGASTRODUODENOSCOPY (EGD)  WITH PROPOFOL;  Surgeon: Lucilla Lame, MD;  Location: [facility] ENDOSCOPY;  Service: Endoscopy;  Laterality: N/A;  . ESOPHAGOGASTRODUODENOSCOPY (EGD) WITH PROPOFOL N/A 12/31/2017   Procedure: ESOPHAGOGASTRODUODENOSCOPY (EGD) WITH PROPOFOL;  Surgeon: [name], MD;  Location: [facility] ENDOSCOPY;  Service: Endoscopy;  Laterality: N/A;  . ESOPHAGOGASTRODUODENOSCOPY (EGD) WITH PROPOFOL N/A 05/08/2018   Procedure: ESOPHAGOGASTRODUODENOSCOPY (EGD) WITH PROPOFOL;  Surgeon: [name], MD;  Location: [facility] ENDOSCOPY;  Service: Gastroenterology;  Laterality: N/A;  . EUS N/A 07/04/2017   Procedure: UPPER ENDOSCOPIC  ULTRASOUND (EUS) RADIAL;  Surgeon: [name], MD;  Location: [facility] ENDOSCOPY;  Service: Endoscopy;  Laterality: N/A;  . FLEXIBLE SIGMOIDOSCOPY N/A 06/11/2017   Procedure: FLEXIBLE SIGMOIDOSCOPY;  Surgeon: Lucilla Lame, MD;  Location: [facility] ENDOSCOPY;  Service: Endoscopy;  Laterality: N/A;  . HERNIA REPAIR  2013  . KYPHOPLASTY N/A 10/06/2015   Procedure: Thoracic twelve kyphoplasty;  Surgeon: Karie Chimera, MD;  Location: [place] NEURO ORS;  Service: Neurosurgery;  Laterality: N/A;  T12 Kyphoplasty  . PROSTATE BIOPSY  11/01/08   Dr. Reece Agar  . [place]  . VASECTOMY  1975    Family History  Problem Relation Age of Onset  . Cancer Father        liver  . Cancer Sister        breast  . Cancer Brother        skin  . Cancer Sister        breast  . Heart disease Sister        pacer  . Heart disease Sister   . Cancer Sister        breast cancer  . Heart disease Sister   . Colon cancer Neg Hx   . Prostate cancer Neg Hx     Social History:  reports that he has never smoked. He has never used smokeless tobacco. He reports that he does not drink alcohol or use drugs.  Retired Agricultural consultant; worked for [employer] for 44 years.  He was in the [employer].  He denies any exposure to radiation or toxins.  The patient is accompanied by his son and girlfriend Joaquim Lai), today.   Allergies:  Allergies  Allergen Reactions  . Flomax [Tamsulosin Hcl] Other (See Comments)    syncope  . Ibuprofen Other (See Comments)    Oral blisters at high doses  . Rosuvastatin     REACTION: muscle stiffness  . Sulfa Antibiotics Rash  . Sulfonamide Derivatives Rash    Current Medications: Current Outpatient Medications  Medication Sig Dispense Refill  . Ascorbic Acid (VITAMIN C) 1000 MG tablet Take 1,000 mg by mouth daily.    Marland Kitchen glimepiride (AMARYL) 1 MG tablet Take 2 tablets (2 mg total) by mouth daily with breakfast. 180 tablet 3  . glucose blood (ONE TOUCH ULTRA TEST) test  strip TO TEST BLOOD SUGAR ONCE OR TWICE DAILY AND AS DIRECTED DX E11.9.  H/o variable blood sugars. 200 each 1  . hydrochlorothiazide (HYDRODIURIL) 12.5 MG tablet Take 1 tablet (12.5 mg total) by mouth daily. 90 tablet 3  . levothyroxine (SYNTHROID, LEVOTHROID) 75 MCG tablet TAKE 1 TABLET(75 MCG) BY MOUTH DAILY 90 tablet 1  . metFORMIN (GLUCOPHAGE) 500 MG tablet TAKE 1 TABLET BY MOUTH EVERY MORNING AND 2 TABLETS AT SUPPER AND 1 TABLETS AT NIGHT IF NEEDED 360 tablet 0  . Multiple Vitamin (MULTIVITAMIN WITH MINERALS) TABS tablet Take 1 tablet by mouth daily. Centrum Silver    . pantoprazole (PROTONIX) 40 MG tablet Take  1 tablet (40 mg total) by mouth 2 (two) times daily. 180 tablet 3  . pravastatin (PRAVACHOL) 80 MG tablet Take 1 tablet (80 mg total) by mouth at bedtime. 90 tablet 3  . pyridOXINE (VITAMIN B-6) 100 MG tablet Take 100 mg by mouth 3 (three) times a week.     Marland Kitchen acetaminophen (TYLENOL) 500 MG tablet Take 500-1,000 mg by mouth every 6 (six) hours as needed (for pain.).    Marland Kitchen benzonatate (TESSALON) 100 MG capsule Take 100 mg by mouth at bedtime as needed for cough.    . Chlorpheniramine-DM (CORICIDIN HBP COUGH/COLD PO) Take by mouth 4 (four) times daily as needed.    . diphenhydrAMINE (BENADRYL) 25 mg capsule Take 25 mg by mouth at bedtime as needed (for sleep.).     Marland Kitchen docusate sodium (COLACE) 100 MG capsule Take 100 mg by mouth 2 (two) times daily as needed (for constipation).     No current facility-administered medications for this visit.     Review of Systems  Constitutional: Positive for weight loss (2 pounds). Negative for chills, diaphoresis, fever and malaise/fatigue.       No complaints.  HENT: Positive for hearing loss (chronic). Negative for congestion, ear discharge, ear pain, nosebleeds, sinus pain and sore throat.   Eyes: Negative.  Negative for blurred vision, double vision, photophobia and pain.  Respiratory: Negative.  Negative for cough, hemoptysis, sputum production  and shortness of breath.   Cardiovascular: Negative.  Negative for chest pain, palpitations, orthopnea, leg swelling and PND.  Gastrointestinal: Negative.  Negative for abdominal pain, blood in stool, constipation, diarrhea, heartburn, melena, nausea and vomiting.  Genitourinary: Negative.  Negative for dysuria, frequency, hematuria and urgency.  Musculoskeletal: Positive for joint pain (arthritis in hands). Negative for back pain, falls, myalgias and neck pain.  Skin: Negative.  Negative for itching and rash.  Neurological: Negative.  Negative for dizziness, tingling, tremors, sensory change, speech change, focal weakness, weakness and headaches.  Endo/Heme/Allergies: Does not bruise/bleed easily.       Diabetes.  Psychiatric/Behavioral: Negative for depression. The patient is not nervous/anxious and does not have insomnia (sleeps most of the time).   All other systems reviewed and are negative.  Performance status (ECOG): 1-2  Physical Exam: Blood pressure (!) 183/81, pulse 68, temperature 98.2 F (36.8 C), temperature source Tympanic, resp. rate 18, height _0  (1.6 m), weight 159 lb 4.8 oz (72.3 kg). GENERAL:  Well developed, well nourished, elderly gentleman sitting comfortably in a wheelchair in the exam room in no acute distress. MENTAL STATUS:  Alert and oriented to person, place and time. HEAD:  Pearline Cables hair.  Normocephalic, atraumatic, face symmetric, no Cushingoid features. EYES:  Glasses.  Blue eyes.  Pupils equal round and reactive to light and accomodation.  No conjunctivitis or scleral icterus. ENT:  Oropharynx clear without lesion.  Tongue normal. Mucous membranes moist.  RESPIRATORY:  Clear to auscultation without rales, wheezes or rhonchi. CARDIOVASCULAR:  Regular rate and rhythm without murmur, rub or gallop. ABDOMEN:  Soft, non-tender, with active bowel sounds, and no hepatosplenomegaly.  No masses. SKIN:  No rashes, ulcers or lesions. EXTREMITIES:  2+ mid tibial edema.   No skin discoloration or tenderness.  No palpable cords. LYMPH NODES: No palpable cervical, supraclavicular, axillary or inguinal adenopathy  NEUROLOGICAL: Unremarkable. PSYCH:  Appropriate.    Appointment on 09/30/2018  Component Date Value Ref Range Status  . Sodium 09/30/2018 140  135 - 145 mmol/L Final  . Potassium 09/30/2018 4.0  3.5 -  5.1 mmol/L Final  . Chloride 09/30/2018 107  98 - 111 mmol/L Final  . CO2 09/30/2018 27  22 - 32 mmol/L Final  . Glucose, Bld 09/30/2018 176* 70 - 99 mg/dL Final  . BUN 09/30/2018 24* 8 - 23 mg/dL Final  . Creatinine, Ser 09/30/2018 1.50* 0.61 - 1.24 mg/dL Final  . Calcium 09/30/2018 9.3  8.9 - 10.3 mg/dL Final  . Total Protein 09/30/2018 6.7  6.5 - 8.1 g/dL Final  . Albumin 09/30/2018 3.7  3.5 - 5.0 g/dL Final  . AST 09/30/2018 27  15 - 41 U/L Final  . ALT 09/30/2018 25  0 - 44 U/L Final  . Alkaline Phosphatase 09/30/2018 55  38 - 126 U/L Final  . Total Bilirubin 09/30/2018 0.8  0.3 - 1.2 mg/dL Final  . GFR calc non Af Amer 09/30/2018 42* >60 mL/min Final  . GFR calc Af Amer 09/30/2018 48* >60 mL/min Final  . Anion gap 09/30/2018 6  5 - 15 Final   Performed at [facility], [address] [place], [place] [address]  . WBC 09/30/2018 6.5  4.0 - 10.5 K/uL Final  . RBC 09/30/2018 3.73* 4.22 - 5.81 MIL/uL Final  . Hemoglobin 09/30/2018 10.3* 13.0 - 17.0 g/dL Final  . HCT 09/30/2018 32.4* 39.0 - 52.0 % Final  . MCV 09/30/2018 86.9  80.0 - 100.0 fL Final  . MCH 09/30/2018 27.6  26.0 - 34.0 pg Final  . MCHC 09/30/2018 31.8  30.0 - 36.0 g/dL Final  . RDW 09/30/2018 15.0  11.5 - 15.5 % Final  . Platelets 09/30/2018 145* 150 - 400 K/uL Final  . nRBC 09/30/2018 0.0  0.0 - 0.2 % Final  . Neutrophils Relative % 09/30/2018 82  % Final  . Neutro Abs 09/30/2018 5.4  1.7 - 7.7 K/uL Final  . Lymphocytes Relative 09/30/2018 6  % Final  . Lymphs Abs 09/30/2018 0.4* 0.7 - 4.0 K/uL Final  . Monocytes Relative 09/30/2018 8  % Final  . Monocytes Absolute  09/30/2018 0.5  0.1 - 1.0 K/uL Final  . Eosinophils Relative 09/30/2018 2  % Final  . Eosinophils Absolute 09/30/2018 0.1  0.0 - 0.5 K/uL Final  . Basophils Relative 09/30/2018 1  % Final  . Basophils Absolute 09/30/2018 0.0  0.0 - 0.1 K/uL Final  . Immature Granulocytes 09/30/2018 1  % Final  . Abs Immature Granulocytes 09/30/2018 0.03  0.00 - 0.07 K/uL Final   Performed at [facility], [address] [place], [place] [address]    Assessment:  Aaron Becker is a 85 y.o. male with clinical stage IIB esophageal cancer (uT2N0).  He presented with an upper GI bleed.  EGD on 06/11/2017 revealed one cratered esophageal ulcer and stigmata of recent esophogeal bleeding at the GE junction.  Biopsy revealed poorly differentiated adenocarcinoma, signet ring type.  There was one non-bleeding cratered gastric ulcer with no stigmata of bleeding in the gastric antrum.  Duodenum was normal.  Colonoscopy on 03/30/2015 revealed a 20 mm tubulovillous adenoma of the rectum.  Flexible sigmoidoscopy on 06/01/2017 revealed a sessile polyp in the rectum and grade II non-bleeding internal hemorrhoids.  PET scan  on 06/27/2017 that revealed no appreciable abnormal esophageal hypermetabolic activity to correspond with the reported tumor. There was no adenopathy or other findings of metastatic disease. There was question that the original tumor is either low-grade or very small.  Upper endoscopic ultrasound (EUS) on 07/04/2017 revealed a 1-2cm ulcerated mass at the GE junction  that was partially circumferential and nonobstructive. The mass is 1 cm in thickness and occupies 3/4 of the  lumen circumference at the GE junction. The mass clearly passes into, but not through the muscularis propria layer of the esophageal wall. There was no paraesophageal adenopathy. Clinical stage was uT2N0 GE junction adenocarcinoma.  He received 5 weeks of carboplatin and Taxol with radiation (07/29/2017 - 09/03/2017).  Radiation  completed on 2020/10/816.  PET scan on 12/04/2017 revealed a small focus of mild uptake (SUV 3.77; background 2.73) localizing to the caudate lobe of liver. There was no corresponding CT abnormality.  Upper endoscopy on 12/31/2017 revealed erythematous mucosa in the esophagus.  Biopsy revealed no malignancy.   EGD on 05/08/2018 revealed a normal esophagus and congested mucosa in the cardia.  GE junction biopsy revealed squamocolumnar mucosa and columnar-lined mucosa with mild chronic inflammation, negative for goblet cells, dysplasia and malignancy. 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  PET scan on 05/20/2018 revealed no FDG evidence of esophageal carcinoma.  There was no evidence of distant metastatic disease.  PDL-1 testing revealed a CPS score of 2 (expressed).  MMR/MSI was intact.  Her2/neu was 1+.  There was not enough tissue for EGFR testing for [facility].  Symptomatically, he denies any complaints.  He is sleeping most of the time.  Exam reveals 2+ edema to mid tibia.  Plan: 1.   Labs today:  CBC with diff, CMP. 2.   Clinical stage IIB esophageal cancer  Patient asymptomatic.  Endoscopic ultrasound revealed persistent disease in the gastric cardia.  Patient is not a surgical candidate.  Review prior conversation regarding potential treatment options.  Patient is scheduled to meet with Dr. Karmen Stabs this week.  Patient states he is not interested in treatment at this time.  3.   RTC after appointment with Dr. Karmen Stabs.  Patient to call.    Addendum:  Patient met with Dr. Karmen Stabs on 10/02/2018.  Discussions were held regarding consideration of 5FU via pump every 2 weeks or FOLFOX chemotherapy every 2 weeks.   Honor Loh, NP  09/30/2018, 10:18 AM   I saw and evaluated the patient, participating in the key portions of the service and reviewing pertinent diagnostic studies and records.  I reviewed the nurse  practitioner's note and agree with the findings and the plan.  The assessment and plan were discussed with the patient.  Several questions were asked by the patient and answered.   Nolon Stalls, MD 09/30/2018,10:18 AM

## 2018-10-02 DIAGNOSIS — C160 Malignant neoplasm of cardia: Secondary | ICD-10-CM | POA: Diagnosis not present

## 2018-10-07 DIAGNOSIS — C159 Malignant neoplasm of esophagus, unspecified: Secondary | ICD-10-CM | POA: Diagnosis not present

## 2018-10-07 DIAGNOSIS — C160 Malignant neoplasm of cardia: Secondary | ICD-10-CM | POA: Diagnosis not present

## 2018-10-07 DIAGNOSIS — K208 Other esophagitis: Secondary | ICD-10-CM | POA: Diagnosis not present

## 2018-10-27 ENCOUNTER — Ambulatory Visit (INDEPENDENT_AMBULATORY_CARE_PROVIDER_SITE_OTHER)
Admission: RE | Admit: 2018-10-27 | Discharge: 2018-10-27 | Disposition: A | Discharge status: Home / Self Care | Payer: Medicare Other | Source: Ambulatory Visit | Attending: Family Medicine | Admitting: Family Medicine

## 2018-10-27 ENCOUNTER — Ambulatory Visit: Payer: Medicare Other | Admitting: Family Medicine

## 2018-10-27 ENCOUNTER — Encounter: Payer: Self-pay | Admitting: Family Medicine

## 2018-10-27 VITALS — BP 142/78 | HR 72 | Temp 97.9°F | Ht 63.0 in

## 2018-10-27 DIAGNOSIS — R0602 Shortness of breath: Secondary | ICD-10-CM | POA: Diagnosis not present

## 2018-10-27 DIAGNOSIS — C159 Malignant neoplasm of esophagus, unspecified: Secondary | ICD-10-CM | POA: Diagnosis not present

## 2018-10-27 DIAGNOSIS — E119 Type 2 diabetes mellitus without complications: Secondary | ICD-10-CM

## 2018-10-27 LAB — CBC WITH DIFFERENTIAL/PLATELET
BASOS PCT: 1 % (ref 0.0–3.0)
Basophils Absolute: 0.1 10*3/uL (ref 0.0–0.1)
EOS ABS: 0.1 10*3/uL (ref 0.0–0.7)
EOS PCT: 1 % (ref 0.0–5.0)
HCT: 35.3 % — ABNORMAL LOW (ref 39.0–52.0)
Hemoglobin: 11.5 g/dL — ABNORMAL LOW (ref 13.0–17.0)
LYMPHS ABS: 0.4 10*3/uL — AB (ref 0.7–4.0)
Lymphocytes Relative: 6.2 % — ABNORMAL LOW (ref 12.0–46.0)
MCHC: 32.7 g/dL (ref 30.0–36.0)
MCV: 84.7 fl (ref 78.0–100.0)
MONO ABS: 0.5 10*3/uL (ref 0.1–1.0)
Monocytes Relative: 7.7 % (ref 3.0–12.0)
NEUTROS ABS: 5.9 10*3/uL (ref 1.4–7.7)
NEUTROS PCT: 84.1 % — AB (ref 43.0–77.0)
Platelets: 191 10*3/uL (ref 150.0–400.0)
RBC: 4.16 Mil/uL — ABNORMAL LOW (ref 4.22–5.81)
RDW: 15.2 % (ref 11.5–15.5)
WBC: 7.1 10*3/uL (ref 4.0–10.5)

## 2018-10-27 LAB — COMPREHENSIVE METABOLIC PANEL
ALT: 24 U/L (ref 0–53)
AST: 21 U/L (ref 0–37)
Albumin: 3.9 g/dL (ref 3.5–5.2)
Alkaline Phosphatase: 53 U/L (ref 39–117)
BUN: 25 mg/dL — AB (ref 6–23)
CHLORIDE: 103 meq/L (ref 96–112)
CO2: 29 meq/L (ref 19–32)
CREATININE: 1.38 mg/dL (ref 0.40–1.50)
Calcium: 9.5 mg/dL (ref 8.4–10.5)
GFR: 48.82 mL/min — ABNORMAL LOW (ref >60.00)
Glucose, Bld: 204 mg/dL — ABNORMAL HIGH (ref 70–99)
POTASSIUM: 4.2 meq/L (ref 3.5–5.1)
SODIUM: 138 meq/L (ref 135–145)
Total Bilirubin: 0.6 mg/dL (ref 0.2–1.2)
Total Protein: 6.8 g/dL (ref 6.0–8.3)

## 2018-10-27 LAB — BRAIN NATRIURETIC PEPTIDE: PRO B NATRI PEPTIDE: 265 pg/mL — AB (ref 0.0–100.0)

## 2018-10-27 LAB — POCT GLYCOSYLATED HEMOGLOBIN (HGB A1C): HEMOGLOBIN A1C: 7.1 % — AB (ref 4.0–5.6)

## 2018-10-27 NOTE — Patient Instructions (Addendum)
Don't change your meds for now.  Go to the lab on the way out.  We'll contact you with your lab and xray report. Plan on a recheck in about 3 months with sugar lab (A1c) at the visit.  You don't have to fast.  If you have any trouble with low sugars in the meantime then cut the glimepiride back to 1 pill a day and update me.  Take care.  Glad to see you.

## 2018-10-27 NOTE — Progress Notes (Signed)
Diabetes:  Using medications without difficulties: yes Hypoglycemic episodes:no Hyperglycemic episodes:no Feet problems: swelling recently noted.   Blood Sugars averaging: usually ~200 at night but that was to prevent lows by the next morning.  eye exam within last year: 04/2018.  Aaron Becker eye.   A1c stable, d/w pt.    ============================ [facility] oncology note reviewed with patient.  He has recurrence of cancer.  They discussed with him observation versus treatment.  He is opted for observation in the meantime. ============================  He has some SOB with exertion.  He was prev working with pT.  Dw pt about relative deconditioning.  No CP.  More BLE edema.  Still sleeping on 2 pillows at baseline.  That has not changed.  PMH and SH reviewed  Meds, vitals, and allergies reviewed.   ROS: Per HPI unless specifically indicated in ROS section   GEN: nad, alert and oriented HEENT: mucous membranes moist NECK: supple w/o LA CV: rrr. PULM: ctab, no inc wob ABD: soft, +bs EXT: 1-2+ edema SKIN: no acute rash

## 2018-10-29 ENCOUNTER — Other Ambulatory Visit: Payer: Self-pay | Admitting: Family Medicine

## 2018-10-29 DIAGNOSIS — R0602 Shortness of breath: Secondary | ICD-10-CM | POA: Insufficient documentation

## 2018-10-29 MED ORDER — FUROSEMIDE 20 MG PO TABS: 20.0000 mg | ORAL_TABLET | Freq: Every day | ORAL | 1 refills | Status: DC | PRN

## 2018-10-29 NOTE — Assessment & Plan Note (Signed)
With more swelling noted.  The concern is for fluid retention/mild CHF.  Discussed with patient about options.  I do want to change his meds until we have additional information.  Check chest x-ray and labs today.  See notes on both.  At this point still okay for outpatient follow-up.  >25 minutes spent in face to face time with patient, >50% spent in counselling or coordination of care.

## 2018-10-29 NOTE — Assessment & Plan Note (Signed)
Discussed with patient about options.  I want to avoid hypoglycemia risk Plan on a recheck in about 3 months with sugar lab (A1c) at the visit.   If any low sugars in the meantime then cut the glimepiride back to 1 pill a day and update me.  He agrees.

## 2018-10-29 NOTE — Assessment & Plan Note (Signed)
[facility] oncology note reviewed with patient.  He has recurrence of cancer.  They discussed with him observation versus treatment.  He is opted for observation in the meantime.  I will defer.  I appreciate the help of all involved.

## 2018-10-30 ENCOUNTER — Other Ambulatory Visit: Payer: Self-pay | Admitting: Family Medicine

## 2018-10-30 NOTE — Telephone Encounter (Signed)
Pharmacy is requesting 90 say supply.  Ok to change to #90?

## 2018-10-30 NOTE — Telephone Encounter (Signed)
Changed, resent.  Thanks.

## 2018-11-06 ENCOUNTER — Encounter: Payer: Self-pay | Admitting: Family Medicine

## 2018-11-07 ENCOUNTER — Telehealth: Payer: Self-pay | Admitting: *Deleted

## 2018-11-07 NOTE — Telephone Encounter (Signed)
Left detailed message on voicemail.  

## 2018-11-07 NOTE — Telephone Encounter (Signed)
Spoke to pts partner Joaquim Lai who states she was to contact Dr Damita Dunnings with update. She states that the lasix seems to be working well and has reduced a lot of the fluid retention in his lower extremities. Pt seems to be "doing much better" and doesn't have any complaints at this time.

## 2018-11-07 NOTE — Telephone Encounter (Signed)
Glad he is better.  Thanks for the update.  I would only use the lasix prn at this point, if needed for edema.   If he has a day where he is able to skip the Lasix, then do not restart hydrochlorothiazide.  If he ends up needing the lasix every day or if the swelling gets worse, then let me know/get rechecked here at clinic.   Thanks.

## 2018-12-26 ENCOUNTER — Telehealth: Payer: Self-pay

## 2018-12-26 NOTE — Telephone Encounter (Signed)
I called back, Manus Gunning answered and both she and the patient gave me information.    He had trouble getting up from sitting over the last week but was still walking normally after that.  No specific unilateral weakness. Other sx started last night, he was SOB last night but then that resolved.  He had some R shoulder pain recently.  Using liniment on shoulder recently, for pain. He thought he pulled a muscle in his shoulder.   No weakness in the arm last night or today.   He has some swelling in his hands last night and earlier today.  Checked his BP, was elevated today.    He has missed lasix recently, had been off med since he hasn't needed it daily.  Took lasix this afternoon and the hand swelling is better now.   He isn't SOB.  He was recently outside walking and working on his bird feeders.  He still has BLE edema, more than typical.    No CP. No fevers known. Speaking in complete sentences.    Discussed options.  He took lasix about 2 hours ago. Doesn't sound to have emergent sx.  Would take lasix tomorrow (Saturday) AM and again at lunch if needed.  He can skip lunch dose if needed.  Can do the same on Sunday if needed.  They can update me on Monday.   They can call triage line in the meantime if needed.  All agree on plan.

## 2018-12-26 NOTE — Telephone Encounter (Signed)
Inbound call to triage Manus Gunning contacted office reporting patient did not take diuretic the previous 2 days without her knowledge. States patient's BP is elevated @ 180s/90s. She gave patient diuretic today to see if it would be effective to decrease BP.

## 2018-12-29 MED ORDER — FUROSEMIDE 20 MG PO TABS: 20.0000 mg | ORAL_TABLET | Freq: Every day | ORAL | Status: DC | PRN

## 2018-12-29 NOTE — Telephone Encounter (Addendum)
Glade Lloyd, discussed options. She'll update patient.  Plan for now: If no swelling and BP is fine, then okay to skip lasix that day.  If some mild swelling, then take 20mg  lasix that day. If more swelling (as with this weekend), then okay to take BID that day.   He may need to adjust his dose day to day.   They can update me as needed.  Med list updated.

## 2018-12-29 NOTE — Addendum Note (Signed)
Addended by: Tonia Ghent on: 12/29/2018 04:28 PM   Modules accepted: Orders

## 2018-12-29 NOTE — Telephone Encounter (Signed)
Manus Gunning left v/m; pt took Lasix 20 mg on 12/27/18 in the morning and again at lunch which brought BP down. Pt did the same thing on 12/28/18 and BP was 134/68. This morning BP 123/62 ; pt felt better on Sat and Sun but pt feels normal today and the swelling is gone. Manus Gunning request cb with further instructions.

## 2019-01-11 ENCOUNTER — Other Ambulatory Visit: Payer: Self-pay | Admitting: Family Medicine

## 2019-01-16 ENCOUNTER — Telehealth: Payer: Self-pay | Admitting: *Deleted

## 2019-01-16 NOTE — Telephone Encounter (Signed)
I called and spoke to patient's significant other. She states that they were told 4 things to watch out for while he is being underobservation for his esophageal cancer. He has lost 10 pounds since Jan. After he was seen by Dr. Durenda Guthrie at [facility].  Dr.Hope had said the best thing to do is observe him. Because of the wt. Loss they wanted to see md. His daughter Rodena Piety cook is a patient of [facility] and that is why patient wants to see Janese Banks also. They want to wait till late may hoping the virus will be gone or better that they feel safe to bring him in. I offered telephone or webex visit. Joaquim Lai sais that he is so hard of hearing that she feels that a face to face visit would be better and someone to look at him and assess him. She does not know if labs would be needed but if he was in clinic it could be done same day. I made an appt for 5/26 at 11:45 and will mail it to them. She is agreeable to plan and I will mail it and send message my chart to pt's daughter who sends messages

## 2019-01-19 ENCOUNTER — Telehealth: Payer: Self-pay | Admitting: *Deleted

## 2019-01-19 NOTE — Telephone Encounter (Signed)
This sounds like it needs to be done sooner than scheduled.  It likely needs to be done in office for the exam.  I don't know if he'll need an xray.  Likely reasonable to schedule in office sooner if possible, if patient agrees to that.  Thanks.

## 2019-01-19 NOTE — Telephone Encounter (Signed)
Called and spoke to Bern (caregiver) and advised her that we are only allowing the patient into the office. Susa Day that she can write down questions and concerns and send in with patient and she agreed. Susa Day that patient will leave with paperwork that will give all of his instructions. Appointment scheduled for 01/20/19 and appointment cancelled for 01/26/19.

## 2019-01-19 NOTE — Telephone Encounter (Signed)
Manus Gunning called stating that patient has an appointment scheduled with Dr. Damita Dunnings 01/26/19. Manus Gunning wants to know if he needs to come to the office that day?  Manus Gunning stated that patient is not using his right hand and arm and wonders what might be going on him Manus Gunning stated that hisright shoulder blade is protruding out and does not know what is causing it. Manus Gunning wants to know if you think that he should come in sooner because he may need an x-ray done?

## 2019-01-19 NOTE — Telephone Encounter (Signed)
Thanks

## 2019-01-20 ENCOUNTER — Encounter: Payer: Self-pay | Admitting: Family Medicine

## 2019-01-20 ENCOUNTER — Other Ambulatory Visit: Payer: Self-pay

## 2019-01-20 ENCOUNTER — Ambulatory Visit (INDEPENDENT_AMBULATORY_CARE_PROVIDER_SITE_OTHER): Payer: Medicare Other | Admitting: Family Medicine

## 2019-01-20 VITALS — BP 140/80 | HR 85 | Temp 97.8°F | Ht 63.0 in | Wt 148.5 lb

## 2019-01-20 DIAGNOSIS — R0602 Shortness of breath: Secondary | ICD-10-CM | POA: Diagnosis not present

## 2019-01-20 DIAGNOSIS — M25519 Pain in unspecified shoulder: Secondary | ICD-10-CM | POA: Diagnosis not present

## 2019-01-20 DIAGNOSIS — E119 Type 2 diabetes mellitus without complications: Secondary | ICD-10-CM | POA: Diagnosis not present

## 2019-01-20 LAB — BASIC METABOLIC PANEL
BUN: 19 mg/dL (ref 6–23)
CO2: 28 mEq/L (ref 19–32)
Calcium: 9.2 mg/dL (ref 8.4–10.5)
Chloride: 103 mEq/L (ref 96–112)
Creatinine, Ser: 1.29 mg/dL (ref 0.40–1.50)
GFR: 52.74 mL/min — ABNORMAL LOW (ref >60.00)
Glucose, Bld: 174 mg/dL — ABNORMAL HIGH (ref 70–99)
Potassium: 4.2 mEq/L (ref 3.5–5.1)
Sodium: 138 mEq/L (ref 135–145)

## 2019-01-20 LAB — BRAIN NATRIURETIC PEPTIDE: Pro B Natriuretic peptide (BNP): 260 pg/mL — ABNORMAL HIGH (ref 0.0–100.0)

## 2019-01-20 LAB — CBC WITH DIFFERENTIAL/PLATELET
Basophils Absolute: 0.1 10*3/uL (ref 0.0–0.1)
Basophils Relative: 1 % (ref 0.0–3.0)
Eosinophils Absolute: 0 10*3/uL (ref 0.0–0.7)
Eosinophils Relative: 0.6 % (ref 0.0–5.0)
HCT: 34 % — ABNORMAL LOW (ref 39.0–52.0)
Hemoglobin: 11.2 g/dL — ABNORMAL LOW (ref 13.0–17.0)
Lymphocytes Relative: 6 % — ABNORMAL LOW (ref 12.0–46.0)
Lymphs Abs: 0.5 10*3/uL — ABNORMAL LOW (ref 0.7–4.0)
MCHC: 33.1 g/dL (ref 30.0–36.0)
MCV: 84.6 fl (ref 78.0–100.0)
Monocytes Absolute: 0.5 10*3/uL (ref 0.1–1.0)
Monocytes Relative: 6.5 % (ref 3.0–12.0)
Neutro Abs: 6.8 10*3/uL (ref 1.4–7.7)
Neutrophils Relative %: 85.9 % — ABNORMAL HIGH (ref 43.0–77.0)
Platelets: 199 10*3/uL (ref 150.0–400.0)
RBC: 4.02 Mil/uL — ABNORMAL LOW (ref 4.22–5.81)
RDW: 16.1 % — ABNORMAL HIGH (ref 11.5–15.5)
WBC: 7.9 10*3/uL (ref 4.0–10.5)

## 2019-01-20 LAB — HEMOGLOBIN A1C: Hgb A1c MFr Bld: 8.2 % — ABNORMAL HIGH (ref 4.6–6.5)

## 2019-01-20 LAB — TSH: TSH: 1.02 u[IU]/mL (ref 0.35–4.50)

## 2019-01-20 NOTE — Patient Instructions (Signed)
Go to the lab on the way out.  We'll contact you with your lab report.  Your shoulder range of motion is good.  You likely have some chronic repositioning of the right shoulder blade that makes it easier to pull a muscle in your shoulder.  It doesn't look like you need xrays at this point since it wouldn't change the plan.  Be careful lifting and taking out the garbage.    Keep taking the fluid pill as you have been and update me as needed.    Take care.  Glad to see you.

## 2019-01-20 NOTE — Progress Notes (Signed)
Shoulder pain.  He thought he had pulled a muscle about 3 weeks ago, taking the garbage out.  He was trying to lift the trash bag and had pain the next day.  It is getting better patient report.  His ROM is better and it is less sore now.  Still using max freeze liniment daily with relief.  His significant other Dub Mikes thought his right scapula was asymmetric.    Still eating well.  Sleeping well per patient report.    Diabetes.  Sugar usually controlled, 136 last night.  No lows.  occ elevation up to 180s with higher carbs foods.    He still has some SOB.  He has been using lasix as needed.  He can walk to the mailbox and back but with more SOB than prev.  Using a cane at baseline. Weight is stable at 145 at home.  Weight is higher today with shoes, etc, but likely similar to prev weights.  Taking 20mg  lasix daily.  BLE edema controlled.    Pandemic cautions d/w pt.    PMH and SH reviewed  ROS: Per HPI unless specifically indicated in ROS section   Meds, vitals, and allergies reviewed.   nad ncat Neck supple, no LA rrr ctab Normal R shoulder ROM, no arm drop.  No pain on internal or external rotation.  Distally neurovascularly intact with normal grip. He does have some likely chronic winging of R scapula- old finding per patient.  abd soft, not ttp, normal BS Trace BLE edema, R>L at baseline.   Speech and gait at baseline.

## 2019-01-22 ENCOUNTER — Encounter: Payer: Self-pay | Admitting: Family Medicine

## 2019-01-22 DIAGNOSIS — M25519 Pain in unspecified shoulder: Secondary | ICD-10-CM | POA: Insufficient documentation

## 2019-01-22 NOTE — Assessment & Plan Note (Signed)
See notes on labs.  No change in meds at this point.  He is taking Lasix 20 mg a day.  Lower extremity edema is controlled.  Lungs are clear.  Still okay for outpatient follow-up.

## 2019-01-22 NOTE — Assessment & Plan Note (Signed)
Sugar usually controlled, 136 last night.  No lows.  occ elevation up to 180s with higher carbs foods.  See notes on labs.

## 2019-01-22 NOTE — Assessment & Plan Note (Signed)
He does have some likely chronic winging of the scapula that may make it easier for him to have a muscle strain/impingement.  However he is clearly better in the meantime.  He has no arm drop and his range of motion is normal.  He has no pain with internal or external rotation.  Routine cautions given to patient.  Imaging would not change the plan at this point.  He does not need intervention otherwise.  Update me as needed.  He agrees.

## 2019-01-26 ENCOUNTER — Ambulatory Visit: Payer: Medicare Other | Admitting: Family Medicine

## 2019-02-03 ENCOUNTER — Other Ambulatory Visit: Payer: Self-pay | Admitting: Family Medicine

## 2019-02-03 NOTE — Telephone Encounter (Signed)
Joaquim Lai called and patient needs a new One Touch Meter for One Touch Strips. [place].

## 2019-02-04 MED ORDER — ONETOUCH ULTRA 2 W/DEVICE KIT: PACK | 0 refills | Status: AC

## 2019-02-04 NOTE — Telephone Encounter (Signed)
Spoke to patient's caregiver and was advised that the machine that he is using now is giving inconsistent readings  Script for new meter sent to pharmacy and patient is aware.

## 2019-02-09 ENCOUNTER — Ambulatory Visit: Payer: Medicare Other | Admitting: Radiation Oncology

## 2019-02-11 ENCOUNTER — Telehealth: Payer: Self-pay | Admitting: *Deleted

## 2019-02-11 NOTE — Telephone Encounter (Signed)
Joaquim Lai called about the patient's appt next week. She knows the policy is that no visitors but she and the pt's family feels like he will not do well without one of them not there with him. She wanted to see if someone could go in with him.  I called her back and said that we have staff that will take him anywhere int he cancer center he needs to go and we can get on cell phone in the room so that family can listen to what md has to say and ask family questions but no one is allowed in cancer center.  She spoke to his kids and they have all decided to postpone the visit to late June and see if the visitor policy is beck  To normal by then. I told her that I will make a new appt late June and mail it to them and they can call and see if the visitor restriction has been lifted at that point. She is agreeable.

## 2019-02-17 ENCOUNTER — Ambulatory Visit: Payer: Medicare Other | Admitting: Oncology

## 2019-02-25 ENCOUNTER — Telehealth: Payer: Self-pay | Admitting: *Deleted

## 2019-02-25 ENCOUNTER — Encounter: Payer: Self-pay | Admitting: Oncology

## 2019-02-25 ENCOUNTER — Other Ambulatory Visit: Payer: Self-pay

## 2019-02-25 ENCOUNTER — Inpatient Hospital Stay: Payer: Medicare Other | Attending: Oncology

## 2019-02-25 ENCOUNTER — Inpatient Hospital Stay (HOSPITAL_BASED_OUTPATIENT_CLINIC_OR_DEPARTMENT_OTHER): Payer: Medicare Other | Admitting: Hospice and Palliative Medicine

## 2019-02-25 ENCOUNTER — Inpatient Hospital Stay (HOSPITAL_BASED_OUTPATIENT_CLINIC_OR_DEPARTMENT_OTHER): Payer: Medicare Other | Admitting: Oncology

## 2019-02-25 VITALS — BP 151/78 | HR 80 | Temp 99.6°F | Resp 20 | Ht 63.0 in | Wt 139.4 lb

## 2019-02-25 DIAGNOSIS — Z7984 Long term (current) use of oral hypoglycemic drugs: Secondary | ICD-10-CM | POA: Insufficient documentation

## 2019-02-25 DIAGNOSIS — E039 Hypothyroidism, unspecified: Secondary | ICD-10-CM | POA: Diagnosis not present

## 2019-02-25 DIAGNOSIS — Z66 Do not resuscitate: Secondary | ICD-10-CM | POA: Diagnosis not present

## 2019-02-25 DIAGNOSIS — C159 Malignant neoplasm of esophagus, unspecified: Secondary | ICD-10-CM

## 2019-02-25 DIAGNOSIS — M1990 Unspecified osteoarthritis, unspecified site: Secondary | ICD-10-CM | POA: Diagnosis not present

## 2019-02-25 DIAGNOSIS — E785 Hyperlipidemia, unspecified: Secondary | ICD-10-CM | POA: Insufficient documentation

## 2019-02-25 DIAGNOSIS — R531 Weakness: Secondary | ICD-10-CM

## 2019-02-25 DIAGNOSIS — Z9221 Personal history of antineoplastic chemotherapy: Secondary | ICD-10-CM | POA: Insufficient documentation

## 2019-02-25 DIAGNOSIS — R112 Nausea with vomiting, unspecified: Secondary | ICD-10-CM

## 2019-02-25 DIAGNOSIS — M6281 Muscle weakness (generalized): Secondary | ICD-10-CM

## 2019-02-25 DIAGNOSIS — Z515 Encounter for palliative care: Secondary | ICD-10-CM | POA: Diagnosis not present

## 2019-02-25 DIAGNOSIS — Z923 Personal history of irradiation: Secondary | ICD-10-CM | POA: Insufficient documentation

## 2019-02-25 DIAGNOSIS — R634 Abnormal weight loss: Secondary | ICD-10-CM

## 2019-02-25 DIAGNOSIS — R5383 Other fatigue: Secondary | ICD-10-CM | POA: Diagnosis not present

## 2019-02-25 DIAGNOSIS — R600 Localized edema: Secondary | ICD-10-CM | POA: Diagnosis not present

## 2019-02-25 DIAGNOSIS — E119 Type 2 diabetes mellitus without complications: Secondary | ICD-10-CM | POA: Diagnosis not present

## 2019-02-25 DIAGNOSIS — Z79899 Other long term (current) drug therapy: Secondary | ICD-10-CM

## 2019-02-25 DIAGNOSIS — R0609 Other forms of dyspnea: Secondary | ICD-10-CM | POA: Insufficient documentation

## 2019-02-25 DIAGNOSIS — R1310 Dysphagia, unspecified: Secondary | ICD-10-CM | POA: Insufficient documentation

## 2019-02-25 DIAGNOSIS — I10 Essential (primary) hypertension: Secondary | ICD-10-CM

## 2019-02-25 DIAGNOSIS — R5381 Other malaise: Secondary | ICD-10-CM

## 2019-02-25 DIAGNOSIS — C160 Malignant neoplasm of cardia: Secondary | ICD-10-CM | POA: Diagnosis not present

## 2019-02-25 LAB — CBC WITH DIFFERENTIAL/PLATELET
Abs Immature Granulocytes: 0.03 10*3/uL (ref 0.00–0.07)
Basophils Absolute: 0 10*3/uL (ref 0.0–0.1)
Basophils Relative: 1 %
Eosinophils Absolute: 0.1 10*3/uL (ref 0.0–0.5)
Eosinophils Relative: 1 %
HCT: 32.5 % — ABNORMAL LOW (ref 39.0–52.0)
Hemoglobin: 10.5 g/dL — ABNORMAL LOW (ref 13.0–17.0)
Immature Granulocytes: 0 %
Lymphocytes Relative: 6 %
Lymphs Abs: 0.4 10*3/uL — ABNORMAL LOW (ref 0.7–4.0)
MCH: 28 pg (ref 26.0–34.0)
MCHC: 32.3 g/dL (ref 30.0–36.0)
MCV: 86.7 fL (ref 80.0–100.0)
Monocytes Absolute: 0.6 10*3/uL (ref 0.1–1.0)
Monocytes Relative: 8 %
Neutro Abs: 6.1 10*3/uL (ref 1.7–7.7)
Neutrophils Relative %: 84 %
Platelets: 185 10*3/uL (ref 150–400)
RBC: 3.75 MIL/uL — ABNORMAL LOW (ref 4.22–5.81)
RDW: 15.6 % — ABNORMAL HIGH (ref 11.5–15.5)
WBC: 7.2 10*3/uL (ref 4.0–10.5)
nRBC: 0 % (ref 0.0–0.2)

## 2019-02-25 LAB — COMPREHENSIVE METABOLIC PANEL
ALT: 21 U/L (ref 0–44)
AST: 24 U/L (ref 15–41)
Albumin: 3.5 g/dL (ref 3.5–5.0)
Alkaline Phosphatase: 62 U/L (ref 38–126)
Anion gap: 10 (ref 5–15)
BUN: 16 mg/dL (ref 8–23)
CO2: 27 mmol/L (ref 22–32)
Calcium: 8.6 mg/dL — ABNORMAL LOW (ref 8.9–10.3)
Chloride: 102 mmol/L (ref 98–111)
Creatinine, Ser: 1.41 mg/dL — ABNORMAL HIGH (ref 0.61–1.24)
GFR calc Af Amer: 52 mL/min — ABNORMAL LOW (ref >60)
GFR calc non Af Amer: 45 mL/min — ABNORMAL LOW (ref >60)
Glucose, Bld: 192 mg/dL — ABNORMAL HIGH (ref 70–99)
Potassium: 3.4 mmol/L — ABNORMAL LOW (ref 3.5–5.1)
Sodium: 139 mmol/L (ref 135–145)
Total Bilirubin: 0.9 mg/dL (ref 0.3–1.2)
Total Protein: 6.6 g/dL (ref 6.5–8.1)

## 2019-02-25 NOTE — Progress Notes (Signed)
[facility]  Telephone:([phone] [phone]   Name: Aaron Becker Date: 02/25/2019 MRN: [id number]  DOB: 1930/04/14  Patient Care Team: Tonia Ghent, MD as PCP - General (Family Medicine) Bary Castilla, Forest Gleason, MD (General Surgery) Modesto Charon, MD (Family Medicine) Lucilla Lame, MD as Consulting Physician (Gastroenterology) Lequita Asal, MD as Referring Physician (Hematology and Oncology) Clent Jacks, RN as Registered Nurse Noreene Filbert, MD as Referring Physician (Radiation Oncology)    REASON FOR CONSULTATION: Palliative Care consult requested for this 85 y.o. male with multiple medical problems including stage IIb esophageal carcinoma and gastric signet ring cell adenocarcinoma (diagnosed by EGD on 06/11/2017) status post concurrent chemo/RT in 2018 who is since been under active surveillance.  Patient was previously followed by Dr. Mike Gip.  He was seen by [facility] oncology for second opinion in January 2020 and consideration of 5-FU but was not interested in active treatment at the time.  Patient is now transitioning care to Dr. Janese Banks.  He has had persistent weight loss and difficulty swallowing.  He was referred to palliative care to help address symptoms and discuss goals.  SOCIAL HISTORY:     reports that he has never smoked. He has never used smokeless tobacco. He reports that he does not drink alcohol or use drugs.   Patient is a widower.  He has a significant other with whom he has lived for the past 20 years.  Patient has a daughter and two sons, all of whom live locally.  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  ADVANCE DIRECTIVES:  On file  CODE STATUS: DNR  PAST MEDICAL HISTORY: Past Medical History:  Diagnosis Date  . Anemia   . Arthritis    hands  . Bradycardia   . Colon polyp   . Diabetes mellitus, type II ([place]) 1986   oral meds  . Elevated PSA 2011   per Dr. Jacqlyn Larsen with  [facility] no problems last 5 or 6 yrs  . Esophageal cancer ([place]) dx sept 18 2018  . HOH (hard of hearing)    bilateral AIDES  . Hyperlipidemia   . Hypertension 02/02   off bp meds since hospitalization 3 weeks ago  . Hypothyroidism 1980's  . Palpitations   . Right rib fracture 2015  . SVT (supraventricular tachycardia) ([place])    a. reported SVT during admission at [facility] 06/2014  . Syncope and collapse    none recent last few weeks  . Wears dentures    upper and lower    PAST SURGICAL HISTORY:  Past Surgical History:  Procedure Laterality Date  . [place], 09/2015   COMPRESSED FRACTURE SPINE  . carotid ultrasound  02/02   [facility]  . CATARACT EXTRACTION W/PHACO Right 03/19/2016   Procedure: CATARACT EXTRACTION PHACO AND INTRAOCULAR LENS PLACEMENT (IOC);  Surgeon: Ronnell Freshwater, MD;  Location: [place];  Service: Ophthalmology;  Laterality: Right;  DIABETIC-oral med RIGHT  . CATARACT EXTRACTION W/PHACO Left 04/23/2016   Procedure: CATARACT EXTRACTION PHACO AND INTRAOCULAR LENS PLACEMENT (IOC);  Surgeon: Ronnell Freshwater, MD;  Location: [place];  Service: Ophthalmology;  Laterality: Left;  LEFT DIABETIC - oral meds  . CHOLECYSTECTOMY  1967  . COLONOSCOPY W/ POLYPECTOMY  2012   Villous adenoma from the rectum without high-grade dysplasia, 10 mm  . COLONOSCOPY WITH PROPOFOL N/A 03/30/2015   Procedure: COLONOSCOPY WITH PROPOFOL;  Surgeon: Robert Bellow, MD;  Location:  [place] ENDOSCOPY;  Service: Endoscopy;  Laterality: N/A;  . CYST EXCISION     thumb  . CYSTECTOMY  06/14/04   mucous cyst excision with left thumb, IP joint debridement  . ESOPHAGOGASTRODUODENOSCOPY (EGD) WITH PROPOFOL N/A 06/11/2017   Procedure: ESOPHAGOGASTRODUODENOSCOPY (EGD) WITH PROPOFOL;  Surgeon: Lucilla Lame, MD;  Location[facility] ENDOSCOPY;  Service: Endoscopy;  Laterality: N/A;  . ESOPHAGOGASTRODUODENOSCOPY (EGD) WITH PROPOFOL N/A 12/31/2017   Procedure:  ESOPHAGOGASTRODUODENOSCOPY (EGD) WITH PROPOFOL;  Surgeon: [name], MD;  Location: [facility] ENDOSCOPY;  Service: Endoscopy;  Laterality: N/A;  . ESOPHAGOGASTRODUODENOSCOPY (EGD) WITH PROPOFOL N/A 05/08/2018   Procedure: ESOPHAGOGASTRODUODENOSCOPY (EGD) WITH PROPOFOL;  Surgeon: [name], MD;  Location[facility] ENDOSCOPY;  Service: Gastroenterology;  Laterality: N/A;  . EUS N/A 07/04/2017   Procedure: UPPER ENDOSCOPIC ULTRASOUND (EUS) RADIAL;  Surgeon: [name], MD;  Location: [facility] ENDOSCOPY;  Service: Endoscopy;  Laterality: N/A;  . FLEXIBLE SIGMOIDOSCOPY N/A 06/11/2017   Procedure: FLEXIBLE SIGMOIDOSCOPY;  Surgeon: Lucilla Lame, MD;  Location: [facility] ENDOSCOPY;  Service: Endoscopy;  Laterality: N/A;  . HERNIA REPAIR  2013  . KYPHOPLASTY N/A 10/06/2015   Procedure: Thoracic twelve kyphoplasty;  Surgeon: Karie Chimera, MD;  Location: [facility]URO [facility]S;  Service: Neurosurgery;  Laterality: N/A;  T12 Kyphoplasty  . PROSTATE BIOPSY  11/01/08   Dr. Reece Agar  . [place]  . VASECTOMY  1975    HEMATOLOGY/ONCOLOGY HISTORY:   No history exists.    ALLERGIES:  is allergic to flomax [tamsulosin hcl]; ibuprofen; rosuvastatin; sulfa antibiotics; and sulfonamide derivatives.  MEDICATIONS:  Current Outpatient Medications  Medication Sig Dispense Refill  . acetaminophen (TYLENOL) 500 MG tablet Take 500-1,000 mg by mouth every 6 (six) hours as needed (for pain.).    Marland Kitchen Ascorbic Acid (VITAMIN C) 1000 MG tablet Take 1,000 mg by mouth daily.    . benzonatate (TESSALON) 100 MG capsule Take 100 mg by mouth at bedtime as needed for cough.    . Blood Glucose Monitoring Suppl (ONE TOUCH ULTRA 2) w/Device KIT Use once to twice daily as needed Dx E11.9 1 each 0  . Chlorpheniramine-DM (CORICIDIN HBP COUGH/COLD PO) Take by mouth 4 (four) times daily as needed.    . diphenhydrAMINE (BENADRYL) 25 mg capsule Take 25 mg by mouth at bedtime as needed (for sleep.).     Marland Kitchen docusate sodium (COLACE) 100 MG  capsule Take 100 mg by mouth 2 (two) times daily as needed (for constipation).    . furosemide (LASIX) 20 MG tablet Take 1-2 tablets (20-40 mg total) by mouth daily as needed for fluid.    Marland Kitchen glimepiride (AMARYL) 1 MG tablet Take 2 tablets (2 mg total) by mouth daily with breakfast. 180 tablet 3  . glucose blood (ONE TOUCH ULTRA TEST) test strip TO TEST BLOOD SUGAR ONCE OR TWICE DAILY AND AS DIRECTED DX E11.9.  H/o variable blood sugars. 200 each 1  . levothyroxine (SYNTHROID, LEVOTHROID) 75 MCG tablet TAKE 1 TABLET(75 MCG) BY MOUTH DAILY 90 tablet 1  . metFORMIN (GLUCOPHAGE) 500 MG tablet TAKE 1 TABLET BY MOUTH EVERY MORNING AND 2 TABLETS BY MOUTH AT SUPPER AND 1 TABLET AT NIGHT IF NEEDED 360 tablet 0  . Multiple Vitamin (MULTIVITAMIN WITH MINERALS) TABS tablet Take 1 tablet by mouth daily. Centrum Silver    . pantoprazole (PROTONIX) 40 MG tablet Take 1 tablet (40 mg total) by mouth 2 (two) times daily. 180 tablet 3  . pravastatin (PRAVACHOL) 80 MG tablet Take 1 tablet (80 mg total) by  mouth at bedtime. 90 tablet 3  . pyridOXINE (VITAMIN B-6) 100 MG tablet Take 100 mg by mouth 3 (three) times a week.      No current facility-administered medications for this visit.     VITAL SIGNS: There were no vitals taken for this visit. There were no vitals filed for this visit.  Estimated body mass index is 26.31 kg/m as calculated from the following:   Height as of 01/20/19: 5' 3"  (1.6 m).   Weight as of 01/20/19: 148 lb 8 oz (67.4 kg).  LABS: CBC:    Component Value Date/Time   WBC 7.9 01/20/2019 1307   HGB 11.2 (L) 01/20/2019 1307   HGB 9.6 (L) 07/16/2014 0220   HCT 34.0 (L) 01/20/2019 1307   HCT 29.5 (L) 07/16/2014 0220   PLT 199.0 01/20/2019 1307   PLT 246 07/16/2014 0220   MCV 84.6 01/20/2019 1307   MCV 91 07/16/2014 0220   NEUTROABS 6.8 01/20/2019 1307   NEUTROABS 7.4 (H) 07/16/2014 0220   LYMPHSABS 0.5 (L) 01/20/2019 1307   LYMPHSABS 0.9 (L) 07/16/2014 0220   MONOABS 0.5 01/20/2019  1307   MONOABS 1.0 07/16/2014 0220   EOSABS 0.0 01/20/2019 1307   EOSABS 0.1 07/16/2014 0220   BASOSABS 0.1 01/20/2019 1307   BASOSABS 0.0 07/16/2014 0220   Comprehensive Metabolic Panel:    Component Value Date/Time   NA 138 01/20/2019 1307   NA 132 (L) 07/16/2014 0220   K 4.2 01/20/2019 1307   K 3.6 07/16/2014 0220   CL 103 01/20/2019 1307   CL 99 07/16/2014 0220   CO2 28 01/20/2019 1307   CO2 27 07/16/2014 0220   BUN 19 01/20/2019 1307   BUN 49 (H) 07/16/2014 0220   CREATININE 1.29 01/20/2019 1307   CREATININE 1.28 10/08/2014 1603   GLUCOSE 174 (H) 01/20/2019 1307   GLUCOSE 171 (H) 07/16/2014 0220   CALCIUM 9.2 01/20/2019 1307   CALCIUM 7.3 (L) 07/16/2014 0220   AST 21 10/27/2018 1246   ALT 24 10/27/2018 1246   ALKPHOS 53 10/27/2018 1246   BILITOT 0.6 10/27/2018 1246   PROT 6.8 10/27/2018 1246   ALBUMIN 3.9 10/27/2018 1246    RADIOGRAPHIC STUDIES: No results found.  PERFORMANCE STATUS (ECOG) : 1 - Symptomatic but completely ambulatory  Review of Systems Unless otherwise noted, a complete review of systems is negative.  Physical Exam General: NAD, frail appearing, thin Cardiovascular: regular rate and rhythm Pulmonary: clear ant fields Abdomen: soft, nontender, + bowel sounds GU: no suprapubic tenderness Extremities: Trace edema, no joint deformities Skin: no rashes Neurological: Weakness but otherwise nonfocal  IMPRESSION: I met with patient today in the clinic.  Patient was also seen by Rulon Abide, NP with symptom management.  I introduced palliative care services and attempted to establish therapeutic rapport.  Patient reports progressive weight loss and difficulty swallowing.  I note that his weight is down to 139 pounds today from previous weight of 161 pounds in December 2019.  This reflects a decrease of 13.7% of total body weight in six months. Patient says that he swallows soft foods reasonably well but has some intermittent difficulty with  swallowing harder foods like meat.  He had difficulty quantifying an average amount that he consumes per meal.  He does say that he is drinking an oral supplement but only half once a day.  Patient says that he has some regurgitation of food/drink at times but does not really seem to endorse active nausea or vomiting.  He  denies fever or chills.  He reports having regular bowel movements.  Patient denies other distressing symptoms like pain, dyspnea, anxiety, depression, or insomnia.  Functionally, he ambulates with use of a cane inside the home but says that he is mostly independent with his self-care.  His significant other, Aaron Becker, seems very involved in patient's daily care and management.  I tried exploring patient's overall goals.  Patient verbalized a desire to explore his current problems and see what options would be available.  I note that patient has previously refused active treatment such as chemotherapy.  Today, patient seemed less sure what decisions he might make regarding future treatment if deemed appropriate by medical oncology.  We discussed advance directives.  Patient says that his daughter, Aaron Becker, is his healthcare power of attorney.  It does appear that these documents are on file.  Patient also says that he has a living will and would not want to be resuscitated or have his life prolonged artificially on machines.  He says that he has a DNR order at home signed by his PCP.  Following completion of the clinic visit, I took patient out to the car and met with his son, daughter, and significant other.  I updated them on the plan for work-up and we discussed management of his symptoms at home.  Will plan for palliative care follow-up when he sees Dr. Janese Banks.  PLAN: -Agree with plan as outlined by [facility]: upper GI series, PET scan, and referral to GI -WIll also refer to RD -Recommend ST eval if appropriate following results of barium study -Continue Protonix 31m BID and  ondansetron as needed -Recommend soft foods, increased oral supplements to BID-TID, and smaller portion sizes but more frequently throughout the day with a focus on high protein/calorie foods -DNR -RTC in two weeks   Patient expressed understanding and was in agreement with this plan. He also understands that He can call the clinic at any time with any questions, concerns, or complaints.     Time Total: 45 minutes  Visit consisted of counseling and education dealing with the complex and emotionally intense issues of symptom management and palliative care in the setting of serious and potentially life-threatening illness.Greater than 50%  of this time was spent counseling and coordinating care related to the above assessment and plan.  Signed by: JAltha Harm PhD, NP-C [phone](Work Cell)

## 2019-02-25 NOTE — Telephone Encounter (Signed)
Patient added for Lab, Symptom Management Clinic, and Palliative Care  Appointments and accepted

## 2019-02-25 NOTE — Telephone Encounter (Signed)
He will need to be seen in [facility] today. I will move up his appointment with me next week  Aaron Becker- best if [name] is available today and can see him  [name]- please add him to my next weeks schedule

## 2019-02-25 NOTE — Telephone Encounter (Signed)
Manus Gunning called reporting that patient needs to be seen ASAP because he has been vomiting since Sunday even with the medications. He is having difficulty swallowing and has lost weight from 158 in Jan to 136 this morning. Please advise

## 2019-02-25 NOTE — Progress Notes (Signed)
Symptom Management Consult note [place]  [phone] [phone]  Patient Care Team: Tonia Ghent, MD as PCP - General (Family Medicine) Bary Castilla, Forest Gleason, MD (General Surgery) Modesto Charon, MD (Family Medicine) Lucilla Lame, MD as Consulting Physician (Gastroenterology) Lequita Asal, MD as Referring Physician (Hematology and Oncology) Clent Jacks, RN as Registered Nurse Noreene Filbert, MD as Referring Physician (Radiation Oncology)   Name of the patient: Aaron Becker  [id number]  Apr 27, 1930   Date of visit: 02/25/2019  Diagnosis: Stage II B Esophageal Cancer   Chief Complaint: Nausea/Vomiting/weight loss  Current Treatment: Observation. S/p Carbo/taxol in 2018.  Oncology History:  Oncology History   Aaron Becker is a 85 y.o. male with clinical stage IIB esophageal cancer (uT2N0).  He presented with an upper GI bleed.  EGD on 06/11/2017 revealed one cratered esophageal ulcer and stigmata of recent esophogeal bleeding at the GE junction.  Biopsy revealed poorly differentiated adenocarcinoma, signet ring type.  There was one non-bleeding cratered gastric ulcer with no stigmata of bleeding in the gastric antrum.  Duodenum was normal.  Colonoscopy on 03/30/2015 revealed a 20 mm tubulovillous adenoma of the rectum.  Flexible sigmoidoscopy on 06/01/2017 revealed a sessile polyp in the rectum and grade II non-bleeding internal hemorrhoids.  PET scan  on 06/27/2017 that revealed no appreciable abnormal esophageal hypermetabolic activity to correspond with the reported tumor. There was no adenopathy or other findings of metastatic disease. There was question that the original tumor is either low-grade or very small.  Upper endoscopic ultrasound (EUS) on 07/04/2017 revealed a 1-2cm ulcerated mass at the GE junction that was partially circumferential and nonobstructive. The mass is 1 cm in thickness and occupies 3/4 of  the  lumen circumference at the GE junction. The mass clearly passes into, but not through the muscularis propria layer of the esophageal wall. There was no paraesophageal adenopathy. Clinical stage was uT2N0 GE junction adenocarcinoma.  He received 5 weeks of carboplatin and Taxol with radiation (07/29/2017 - 09/03/2017).  Radiation completed on 11/30/2016.  PET scan on 12/04/2017 revealed a small focus of mild uptake (SUV 3.77; background 2.73) localizing to the caudate lobe of liver. There was no corresponding CT abnormality.  Upper endoscopy on 12/31/2017 revealed erythematous mucosa in the esophagus.  Biopsy revealed no malignancy.   EGD on 05/08/2018 revealed a normal esophagus and congested mucosa in the cardia.  GE junction biopsy revealed squamocolumnar mucosa and columnar-lined mucosa with mild chronic inflammation, negative for goblet cells, dysplasia and malignancy. 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  PET scan on 05/20/2018 revealed no FDG evidence of esophageal carcinoma.  There was no evidence of distant metastatic disease.  PDL-1 testing revealed a CPS score of 2 (expressed).  MMR/MSI was intact.  Her2/neu was 1+.  There was not enough tissue for EGFR testing for [facility]     Esophageal cancer, stage IIB ([facility])   07/11/2017 Initial Diagnosis    Esophageal cancer, stage IIB ([facility])     Subjective D  ECOG: 2 - Symptomatic, <50% confined to bed   Subjective:     Aaron Becker is a 85 y.o. male who presents for evaluation of trouble swallowing, nausea and vomiting.  It has been present for 4 days.  Patient significant other, Aaron Becker describes a slow decline over the course of the past 3 months.  She weighs him daily and he continues to lose weight.  She  has noticed since 02/22/2019 he has required a dose of Zofran daily due to nausea and vomiting clear slimy sputum.  She was instructed by Dr. Fanny Skates back in January  to keep a close eye on him for the following concerns: Trouble with getting food down Trouble with food getting stuck Pain with trying to get food down Weight loss that is not explained They decided on watchful waiting with the introduction of some therapy if symptoms developed. Chemo would possibly be an option should he require it.  They are worried about dehydration given he has been unable to keep fluid downs and progression of disease.   Associated signs & symptoms:  ability to keep down some fluids   The following portions of the patient's history were reviewed and updated as appropriate: allergies, current medications, past family history, past medical history, past social history, past surgical history and problem list. Review of Systems A comprehensive review of systems was negative except for: Constitutional: positive for anorexia, fatigue, malaise and weight loss Ears, nose, mouth, throat, and face: positive for sore throat Respiratory: positive for dyspnea on exertion and sputum Cardiovascular: positive for dyspnea and fatigue Gastrointestinal: positive for dysphagia, nausea, reflux symptoms and vomiting Musculoskeletal: positive for muscle weakness Neurological: positive for coordination problems and weakness       Objective:    BP (!) 151/78 (BP Location: Right Arm, Patient Position: Sitting, Cuff Size: Normal)    Pulse 80    Temp 99.6 F (37.6 C) (Tympanic)    Resp 20    Ht 5' 3"  (1.6 m)    Wt 139 lb 6.4 oz (63.2 kg)    BMI 24.69 kg/m  General appearance: alert, fatigued, no distress and pale Nose: Nares normal. Septum midline. Mucosa normal. No drainage or sinus tenderness. Lungs: clear to auscultation bilaterally Heart: regular rate and rhythm, S1, S2 normal, no murmur, click, rub or gallop Abdomen: soft, non-tender; bowel sounds normal; no masses,  no organomegaly Pulses: 2+ and symmetric Skin: Skin color, texture, turgor normal. No rashes or lesions Neurologic:  Grossly normal    Assessment:     Dysphasia due to unknown etiology.  Likely related to known esophageal cancer.  Plan:    Aaron Becker is a 85 year old male who presents to [facility] for dysphasia, vomiting with nausea that has worsened x3 days.  He has required Zofran daily with little relief.  Esophageal cancer/gastroesophageal junction cancer: Initially diagnosed by EGD on 05/11/2017 status post concurrent chemo/radiation in 2018 who is under active surveillance.  Patient previously followed by Dr. Mike Gip.  Evaluated by [facility] for second opinion in January 2020 for consideration of 5-FU but declined active treatment.  He is transitioning care to Dr. Janese Banks.  Scheduled for initial visit next week.  Weight loss/dysphasia: These are not new symptoms.  Symptoms have worsened over the course of the past several days.  Able to swallow soft foods with intermittent difficulty with harder foods such as meat.  Drinking oral supplements occasionally each day (maybe 1/day).  Admits to regurgitation of food/drink at times but does not seem to have active vomiting.  Has nausea.  Taking Zofran daily since May 31.  Plan: Upper GI series/barium swallow.  PET scan if approved.  If not we will get CT A/P/C.  Referral to GI.  Previously seen by Dr. Vicente Males. Continue Protonix 40 mg twice daily and Zofran as needed. Continue soft foods and eat smaller portion sizes more frequently throughout the day.  Focus on high protein caloric  foods. RTC for results and consultation with Dr. Janese Banks net week.   Greater than 50% was spent in counseling and coordination of care with this patient including but not limited to discussion of the relevant topics above (See A&P) including, but not limited to diagnosis and management of acute and chronic medical conditions.   Faythe Casa, NP 02/27/2019 12:14 PM

## 2019-02-27 ENCOUNTER — Other Ambulatory Visit: Payer: Self-pay

## 2019-02-27 ENCOUNTER — Ambulatory Visit
Admission: RE | Admit: 2019-02-27 | Discharge: 2019-02-27 | Disposition: A | Discharge status: Home / Self Care | Payer: Medicare Other | Source: Ambulatory Visit | Attending: Oncology | Admitting: Oncology

## 2019-02-27 DIAGNOSIS — R634 Abnormal weight loss: Secondary | ICD-10-CM | POA: Diagnosis not present

## 2019-02-27 DIAGNOSIS — C159 Malignant neoplasm of esophagus, unspecified: Secondary | ICD-10-CM | POA: Diagnosis not present

## 2019-02-27 DIAGNOSIS — R112 Nausea with vomiting, unspecified: Secondary | ICD-10-CM | POA: Insufficient documentation

## 2019-02-27 DIAGNOSIS — R1310 Dysphagia, unspecified: Secondary | ICD-10-CM | POA: Diagnosis not present

## 2019-02-27 NOTE — Progress Notes (Signed)
Good afternoon! Her are the results for his Barium swallow. His CT C/A/P scan will be scheduled for Monday. PET was denied. Patient and significant other are aware of appts. Let me know if you need anything else from me. He is scheduled to see Dr. Janese Banks next week with results.   Faythe Casa, NP 02/27/2019 11:37 AM

## 2019-03-02 ENCOUNTER — Other Ambulatory Visit: Payer: Medicare Other

## 2019-03-02 ENCOUNTER — Ambulatory Visit
Admission: RE | Admit: 2019-03-02 | Discharge: 2019-03-02 | Disposition: A | Discharge status: Home / Self Care | Payer: Medicare Other | Source: Ambulatory Visit | Attending: Oncology | Admitting: Oncology

## 2019-03-02 ENCOUNTER — Ambulatory Visit: Admission: RE | Admit: 2019-03-02 | Payer: Medicare Other | Source: Ambulatory Visit

## 2019-03-02 ENCOUNTER — Other Ambulatory Visit: Payer: Self-pay

## 2019-03-02 DIAGNOSIS — C159 Malignant neoplasm of esophagus, unspecified: Secondary | ICD-10-CM | POA: Diagnosis not present

## 2019-03-02 DIAGNOSIS — Z8501 Personal history of malignant neoplasm of esophagus: Secondary | ICD-10-CM | POA: Diagnosis not present

## 2019-03-02 DIAGNOSIS — R634 Abnormal weight loss: Secondary | ICD-10-CM | POA: Insufficient documentation

## 2019-03-02 DIAGNOSIS — R112 Nausea with vomiting, unspecified: Secondary | ICD-10-CM | POA: Diagnosis not present

## 2019-03-02 DIAGNOSIS — J9811 Atelectasis: Secondary | ICD-10-CM | POA: Diagnosis not present

## 2019-03-02 DIAGNOSIS — R0602 Shortness of breath: Secondary | ICD-10-CM | POA: Diagnosis not present

## 2019-03-02 MED ORDER — IOHEXOL 300 MG/ML  SOLN
75.0000 mL | Freq: Once | INTRAMUSCULAR | Status: AC | PRN
  Administered 2019-03-02: 75 mL via INTRAVENOUS

## 2019-03-04 ENCOUNTER — Other Ambulatory Visit: Payer: Medicare Other

## 2019-03-05 ENCOUNTER — Other Ambulatory Visit: Payer: Self-pay

## 2019-03-05 ENCOUNTER — Inpatient Hospital Stay (HOSPITAL_BASED_OUTPATIENT_CLINIC_OR_DEPARTMENT_OTHER): Payer: Medicare Other | Admitting: Oncology

## 2019-03-05 ENCOUNTER — Encounter: Payer: Self-pay | Admitting: Oncology

## 2019-03-05 VITALS — BP 133/71 | HR 84 | Temp 97.8°F | Resp 18

## 2019-03-05 DIAGNOSIS — E119 Type 2 diabetes mellitus without complications: Secondary | ICD-10-CM

## 2019-03-05 DIAGNOSIS — R634 Abnormal weight loss: Secondary | ICD-10-CM

## 2019-03-05 DIAGNOSIS — R5381 Other malaise: Secondary | ICD-10-CM | POA: Diagnosis not present

## 2019-03-05 DIAGNOSIS — M1990 Unspecified osteoarthritis, unspecified site: Secondary | ICD-10-CM

## 2019-03-05 DIAGNOSIS — E039 Hypothyroidism, unspecified: Secondary | ICD-10-CM

## 2019-03-05 DIAGNOSIS — R5383 Other fatigue: Secondary | ICD-10-CM | POA: Diagnosis not present

## 2019-03-05 DIAGNOSIS — Z79899 Other long term (current) drug therapy: Secondary | ICD-10-CM

## 2019-03-05 DIAGNOSIS — R112 Nausea with vomiting, unspecified: Secondary | ICD-10-CM | POA: Diagnosis not present

## 2019-03-05 DIAGNOSIS — R1310 Dysphagia, unspecified: Secondary | ICD-10-CM | POA: Diagnosis not present

## 2019-03-05 DIAGNOSIS — Z515 Encounter for palliative care: Secondary | ICD-10-CM | POA: Diagnosis not present

## 2019-03-05 DIAGNOSIS — Z66 Do not resuscitate: Secondary | ICD-10-CM

## 2019-03-05 DIAGNOSIS — Z7189 Other specified counseling: Secondary | ICD-10-CM

## 2019-03-05 DIAGNOSIS — I10 Essential (primary) hypertension: Secondary | ICD-10-CM

## 2019-03-05 DIAGNOSIS — E785 Hyperlipidemia, unspecified: Secondary | ICD-10-CM

## 2019-03-05 DIAGNOSIS — Z9221 Personal history of antineoplastic chemotherapy: Secondary | ICD-10-CM

## 2019-03-05 DIAGNOSIS — C159 Malignant neoplasm of esophagus, unspecified: Secondary | ICD-10-CM

## 2019-03-05 DIAGNOSIS — C160 Malignant neoplasm of cardia: Secondary | ICD-10-CM | POA: Diagnosis not present

## 2019-03-05 DIAGNOSIS — R531 Weakness: Secondary | ICD-10-CM | POA: Diagnosis not present

## 2019-03-05 DIAGNOSIS — Z923 Personal history of irradiation: Secondary | ICD-10-CM | POA: Diagnosis not present

## 2019-03-05 DIAGNOSIS — C169 Malignant neoplasm of stomach, unspecified: Secondary | ICD-10-CM

## 2019-03-05 DIAGNOSIS — Z7984 Long term (current) use of oral hypoglycemic drugs: Secondary | ICD-10-CM

## 2019-03-05 NOTE — Progress Notes (Signed)
Pt here to get ct results and he states that his stomach hurts when he eats. He feels like it gets stuck and sits on his stomach.

## 2019-03-09 ENCOUNTER — Other Ambulatory Visit: Payer: Self-pay | Admitting: Family Medicine

## 2019-03-09 NOTE — Progress Notes (Signed)
Hematology/Oncology Consult note [facility]  [phone] [phone]  Patient Care Team: Tonia Ghent, MD as PCP - General (Family Medicine) Bary Castilla, Forest Gleason, MD (General Surgery) Modesto Charon, MD (Family Medicine) Lucilla Lame, MD as Consulting Physician (Gastroenterology) Lequita Asal, MD as Referring Physician (Hematology and Oncology) Clent Jacks, RN as Registered Nurse Noreene Filbert, MD as Referring Physician (Radiation Oncology)   Name of the patient: Aaron Becker  [id number]  [birth year]   Date of visit: 03/09/19  Diagnosis- 1. 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 He received concurrent carbo/taxol RT for 5 weeks which h completed in dec 2018. 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 He was also seen by Dr. Fanny Skates at [facility] and plan was watchful waiting versus 5 fu single agent chemo. Patient chose watchful waiting  He is now transferring care to me.   Interval history- patient has been having difficulty swallowing. He mostly eats soft food which he can swallow with some difficulty. He has lost close to 15 pounds in the last 6 months. Denies any pain  ECOG PS- 2 Pain scale- 0   Review of systems- Review of Systems  Constitutional: Positive for malaise/fatigue. Negative for chills, fever and weight loss.  HENT: Negative for congestion, ear discharge and nosebleeds.   Eyes: Negative for blurred vision.  Respiratory: Negative for cough, hemoptysis, sputum production, shortness of breath and wheezing.   Cardiovascular: Negative for chest pain, palpitations, orthopnea and claudication.  Gastrointestinal: Negative for abdominal pain, blood in  stool, constipation, diarrhea, heartburn, melena, nausea and vomiting.       Difficulty swallowing  Genitourinary: Negative for dysuria, flank pain, frequency, hematuria and urgency.  Musculoskeletal: Negative for back pain, joint pain and myalgias.  Skin: Negative for rash.  Neurological: Negative for dizziness, tingling, focal weakness, seizures, weakness and headaches.  Endo/Heme/Allergies: Does not bruise/bleed easily.  Psychiatric/Behavioral: Negative for depression and suicidal ideas. The patient does not have insomnia.       Allergies  Allergen Reactions   Flomax [Tamsulosin Hcl] Other (See Comments)    syncope   Ibuprofen Other (See Comments)    Oral blisters at high doses   Rosuvastatin     REACTION: muscle stiffness   Sulfa Antibiotics Rash   Sulfonamide Derivatives Rash     Past Medical History:  Diagnosis Date   Anemia    Arthritis    hands   Bradycardia    Colon polyp    Diabetes mellitus, type II ([place]) 1986   oral meds   Elevated PSA 2011   per Dr. Jacqlyn Larsen with Uro no problems last 5 or 6 yrs   Esophageal cancer ([place]) dx sept 18 2018   HOH (hard of hearing)    bilateral AIDES   Hyperlipidemia    Hypertension 02/02   off bp meds since hospitalization 3 weeks ago   Hypothyroidism 1980's   Palpitations    Right rib fracture 2015   SVT (supraventricular tachycardia) ([place])    a. reported SVT during admission at [facility] 06/2014   Syncope and collapse    none recent last few weeks   Wears dentures    upper and lower     Past Surgical History:  Procedure Laterality Date   BACK SURGERY   1978, 09/2015   COMPRESSED FRACTURE SPINE   carotid ultrasound  02/02   wnl   CATARACT EXTRACTION W/PHACO Right 03/19/2016   Procedure: CATARACT EXTRACTION PHACO AND INTRAOCULAR LENS PLACEMENT (IOC);  Surgeon: Ronnell Freshwater, MD;  Location: [facility];  Service: Ophthalmology;  Laterality: Right;  DIABETIC-oral med RIGHT   CATARACT  EXTRACTION W/PHACO Left 04/23/2016   Procedure: CATARACT EXTRACTION PHACO AND INTRAOCULAR LENS PLACEMENT (IOC);  Surgeon: Ronnell Freshwater, MD;  Location: [facility];  Service: Ophthalmology;  Laterality: Left;  LEFT DIABETIC - oral meds   CHOLECYSTECTOMY  1967   COLONOSCOPY W/ POLYPECTOMY  2012   Villous adenoma from the rectum without high-grade dysplasia, 10 mm   COLONOSCOPY WITH PROPOFOL N/A 03/30/2015   Procedure: COLONOSCOPY WITH PROPOFOL;  Surgeon: Robert Bellow, MD;  Location: [facility] ENDOSCOPY;  Service: Endoscopy;  Laterality: N/A;   CYST EXCISION     thumb   CYSTECTOMY  06/14/04   mucous cyst excision with left thumb, IP joint debridement   ESOPHAGOGASTRODUODENOSCOPY (EGD) WITH PROPOFOL N/A 06/11/2017   Procedure: ESOPHAGOGASTRODUODENOSCOPY (EGD) WITH PROPOFOL;  Surgeon: Lucilla Lame, MD;  Location: [facility] ENDOSCOPY;  Service: Endoscopy;  Laterality: N/A;   ESOPHAGOGASTRODUODENOSCOPY (EGD) WITH PROPOFOL N/A 12/31/2017   Procedure: ESOPHAGOGASTRODUODENOSCOPY (EGD) WITH PROPOFOL;  Surgeon: [name], MD;  Location: [facility] ENDOSCOPY;  Service: Endoscopy;  Laterality: N/A;   ESOPHAGOGASTRODUODENOSCOPY (EGD) WITH PROPOFOL N/A 05/08/2018   Procedure: ESOPHAGOGASTRODUODENOSCOPY (EGD) WITH PROPOFOL;  Surgeon: [name], MD;  Location: [facility] ENDOSCOPY;  Service: Gastroenterology;  Laterality: N/A;   EUS N/A 07/04/2017   Procedure: UPPER ENDOSCOPIC ULTRASOUND (EUS) RADIAL;  Surgeon: [name], MD;  Location: [facility] ENDOSCOPY;  Service: Endoscopy;  Laterality: N/A;   FLEXIBLE SIGMOIDOSCOPY N/A 06/11/2017   Procedure: FLEXIBLE SIGMOIDOSCOPY;  Surgeon: Lucilla Lame, MD;  Location: [facility] ENDOSCOPY;  Service: Endoscopy;  Laterality: N/A;   HERNIA REPAIR  2013   KYPHOPLASTY N/A 10/06/2015   Procedure: Thoracic twelve kyphoplasty;  Surgeon: Karie Chimera, MD;  Location: [facility] NEURO ORS;  Service: Neurosurgery;  Laterality: N/A;  T12 Kyphoplasty   PROSTATE BIOPSY  11/01/08    Dr. Reece Agar   VARICOSE VEIN SURGERY  1975   VASECTOMY  1975    Social History   Socioeconomic History   Marital status: Widowed    Spouse name: Not on file   Number of children: Not on file   Years of education: Not on file   Highest education level: Not on file  Occupational History   Occupation: Pharmacist, community: retired    Comment: Retired 1999  Scientist, product/process development strain: Not on file   Food insecurity    Worry: Not on file    Inability: Not on Lexicographer needs    Medical: Not on file    Non-medical: Not on file  Tobacco Use   Smoking status: Never Smoker   Smokeless tobacco: Never Used  Substance and Sexual Activity   Alcohol use: No    Alcohol/week: 0.0 standard drinks   Drug use: No   Sexual activity: Never  Lifestyle   Physical activity    Days per week: Not on file    Minutes per session: Not on file   Stress: Not on file  Relationships   Social connections    Talks on phone: Not on file    Gets together: Not on file  Attends religious service: Not on file    Active member of club or organization: Not on file    Attends meetings of clubs or organizations: Not on file    Relationship status: Not on file   Intimate partner violence    Fear of current or ex partner: Not on file    Emotionally abused: Not on file    Physically abused: Not on file    Forced sexual activity: Not on file  Other Topics Concern   Not on file  Social History Narrative   [place]- widower 2000 ([place]), not marred but with partner since 2001.    Retired from [employer].     [employer] '[employer], stationed in [place]    Family History  Problem Relation Age of Onset   Cancer Father        liver   Cancer Sister        breast   Cancer Brother        skin   Cancer Sister        breast   Heart disease Sister        pacer   Heart disease Sister    Cancer Sister        breast cancer   Heart disease  Sister    Colon cancer Neg Hx    Prostate cancer Neg Hx      Current Outpatient Medications:    acetaminophen (TYLENOL) 500 MG tablet, Take 500-1,000 mg by mouth every 6 (six) hours as needed (for pain.)., Disp: , Rfl:    Ascorbic Acid (VITAMIN C) 1000 MG tablet, Take 1,000 mg by mouth daily., Disp: , Rfl:    Blood Glucose Monitoring Suppl (ONE TOUCH ULTRA 2) w/Device KIT, Use once to twice daily as needed Dx E11.9, Disp: 1 each, Rfl: 0   diphenhydrAMINE (BENADRYL) 25 mg capsule, Take 25 mg by mouth at bedtime as needed (for sleep.). , Disp: , Rfl:    furosemide (LASIX) 20 MG tablet, Take 1-2 tablets (20-40 mg total) by mouth daily as needed for fluid., Disp: , Rfl:    glimepiride (AMARYL) 1 MG tablet, Take 2 tablets (2 mg total) by mouth daily with breakfast., Disp: 180 tablet, Rfl: 3   glucose blood (ONE TOUCH ULTRA TEST) test strip, TO TEST BLOOD SUGAR ONCE OR TWICE DAILY AND AS DIRECTED DX E11.9.  H/o variable blood sugars., Disp: 200 each, Rfl: 1   levothyroxine (SYNTHROID, LEVOTHROID) 75 MCG tablet, TAKE 1 TABLET(75 MCG) BY MOUTH DAILY, Disp: 90 tablet, Rfl: 1   metFORMIN (GLUCOPHAGE) 500 MG tablet, TAKE 1 TABLET BY MOUTH EVERY MORNING AND 2 TABLETS BY MOUTH AT SUPPER AND 1 TABLET AT NIGHT IF NEEDED, Disp: 360 tablet, Rfl: 0   Multiple Vitamin (MULTIVITAMIN WITH MINERALS) TABS tablet, Take 1 tablet by mouth daily. Centrum Silver, Disp: , Rfl:    ondansetron (ZOFRAN) 4 MG tablet, Take 4 mg by mouth every 8 (eight) hours as needed for nausea or vomiting., Disp: , Rfl:    pantoprazole (PROTONIX) 40 MG tablet, Take 1 tablet (40 mg total) by mouth 2 (two) times daily., Disp: 180 tablet, Rfl: 3   pravastatin (PRAVACHOL) 80 MG tablet, Take 1 tablet (80 mg total) by mouth at bedtime., Disp: 90 tablet, Rfl: 3   pyridOXINE (VITAMIN B-6) 100 MG tablet, Take 100 mg by mouth 3 (three) times a week. , Disp: , Rfl:    benzonatate (TESSALON) 100 MG capsule, Take 100 mg by mouth at bedtime  as needed  for cough., Disp: , Rfl:    Chlorpheniramine-DM (CORICIDIN HBP COUGH/COLD PO), Take by mouth 4 (four) times daily as needed., Disp: , Rfl:    docusate sodium (COLACE) 100 MG capsule, Take 100 mg by mouth 2 (two) times daily as needed (for constipation)., Disp: , Rfl:   Physical exam:  Vitals:   03/05/19 1504  BP: 133/71  Pulse: 84  Resp: 18  Temp: 97.8 F (36.6 C)  TempSrc: Tympanic   Physical Exam Constitutional:      Comments: Thin frail elderly gentleman sitting in a wheelchair  HENT:     Head: Normocephalic and atraumatic.  Eyes:     Pupils: Pupils are equal, round, and reactive to light.  Neck:     Musculoskeletal: Normal range of motion.  Cardiovascular:     Rate and Rhythm: Normal rate and regular rhythm.     Heart sounds: Normal heart sounds.  Pulmonary:     Effort: Pulmonary effort is normal.     Breath sounds: Normal breath sounds.  Abdominal:     General: Bowel sounds are normal.     Palpations: Abdomen is soft.  Skin:    General: Skin is warm and dry.  Neurological:     Mental Status: He is alert and oriented to person, place, and time.      CMP Latest Ref Rng & Units 02/25/2019  Glucose 70 - 99 mg/dL 192(H)  BUN 8 - 23 mg/dL 16  Creatinine 0.61 - 1.24 mg/dL 1.41(H)  Sodium 135 - 145 mmol/L 139  Potassium 3.5 - 5.1 mmol/L 3.4(L)  Chloride 98 - 111 mmol/L 102  CO2 22 - 32 mmol/L 27  Calcium 8.9 - 10.3 mg/dL 8.6(L)  Total Protein 6.5 - 8.1 g/dL 6.6  Total Bilirubin 0.3 - 1.2 mg/dL 0.9  Alkaline Phos 38 - 126 U/L 62  AST 15 - 41 U/L 24  ALT 0 - 44 U/L 21   CBC Latest Ref Rng & Units 02/25/2019  WBC 4.0 - 10.5 K/uL 7.2  Hemoglobin 13.0 - 17.0 g/dL 10.5(L)  Hematocrit 39.0 - 52.0 % 32.5(L)  Platelets 150 - 400 K/uL 185    No images are attached to the encounter.  Ct Chest W Contrast  Result Date: 03/02/2019 CLINICAL DATA:  Four-day history of difficulty swallowing, nausea and vomiting. History of esophageal cancer status post chemotherapy  and radiation therapy. EXAM: CT CHEST, ABDOMEN, AND PELVIS WITH CONTRAST TECHNIQUE: Multidetector CT imaging of the chest, abdomen and pelvis was performed following the standard protocol during bolus administration of intravenous contrast. CONTRAST:  47m OMNIPAQUE IOHEXOL 300 MG/ML  SOLN COMPARISON:  Multiple prior PET CTs from 2019 and 2018 FINDINGS: CT CHEST FINDINGS Cardiovascular: The heart is normal in size for age. No pericardial effusion. There is moderate tortuosity, ectasia and calcification of the thoracic aorta but no focal aneurysm or dissection. The branch vessels are patent. Scattered coronary artery calcifications are noted. Mediastinum/Nodes: No mediastinal or hilar mass or lymphadenopathy. The esophagus is unremarkable. No esophageal mass is identified. There is mild fullness in the region of the distal esophagus and GE junction with some interstitial changes in the surrounding fat. This is likely radiation change. No discrete recurrent mass is identified. However, it does appear slightly more prominent than the prior PET-CT and endoscopic evaluation may be helpful. Lungs/Pleura: No suspicious lung lesions to suggest metastatic disease. No infiltrates, edema or effusions. Patchy bibasilar atelectasis is noted along with basilar scarring changes. Musculoskeletal: No significant bony findings. Stable degenerative changes  involving the spine and stable osteoporosis. Remote T12 vertebral augmentation changes. CT ABDOMEN PELVIS FINDINGS Hepatobiliary: No worrisome hepatic lesions to suggest metastatic disease. The gallbladder is surgically absent. No common bile duct dilatation. Pancreas: Moderate pancreatic atrophy but no mass or acute inflammation. Spleen: Normal size.  No focal lesions. Adrenals/Urinary Tract: The adrenal glands and kidneys are unremarkable and stable. No renal lesions or hydronephrosis. The bladder appears normal. Stomach/Bowel: The stomach is not well distended with contrast but  no gastric mass is identified. The duodenum, small bowel and colon are unremarkable. No acute inflammatory changes, mass lesions or obstructive findings. Advanced sigmoid colon diverticulosis is again noted but no findings for acute diverticulitis. Vascular/Lymphatic: Moderate tortuosity and calcification of the thoracic aorta but no aneurysm or dissection. The branch vessels are patent. The major venous structures are patent. No gastrohepatic ligament, celiac axis or retroperitoneal adenopathy. Reproductive: Enlarged prostate gland. The seminal vesicles appear normal. Other: No pelvic mass or adenopathy. No free pelvic fluid collections. No inguinal mass or adenopathy. No abdominal wall hernia or subcutaneous lesions. Musculoskeletal: No significant bony findings. Stable degenerative changes involving the spine. IMPRESSION: 1. Soft tissue fullness in the distal esophagus/GE junction region. This appears slightly more prominent than the prior PET-CT from 05/20/2018. I do not see a discrete mass and this could be progressive radiation change but endoscopic evaluation may be helpful for further evaluation and to exclude recurrent infiltrating tumor. 2. No adenopathy or other findings for metastatic disease involving the chest, abdomen or pelvis. 3. Stable age related atherosclerotic calcifications involving the thoracic and abdominal aorta and coronary arteries. Electronically Signed   By: Marijo Sanes M.D.   On: 03/02/2019 12:53   Ct Abdomen Pelvis W Contrast  Result Date: 03/02/2019 CLINICAL DATA:  Four-day history of difficulty swallowing, nausea and vomiting. History of esophageal cancer status post chemotherapy and radiation therapy. EXAM: CT CHEST, ABDOMEN, AND PELVIS WITH CONTRAST TECHNIQUE: Multidetector CT imaging of the chest, abdomen and pelvis was performed following the standard protocol during bolus administration of intravenous contrast. CONTRAST:  39m OMNIPAQUE IOHEXOL 300 MG/ML  SOLN COMPARISON:   Multiple prior PET CTs from 2019 and 2018 FINDINGS: CT CHEST FINDINGS Cardiovascular: The heart is normal in size for age. No pericardial effusion. There is moderate tortuosity, ectasia and calcification of the thoracic aorta but no focal aneurysm or dissection. The branch vessels are patent. Scattered coronary artery calcifications are noted. Mediastinum/Nodes: No mediastinal or hilar mass or lymphadenopathy. The esophagus is unremarkable. No esophageal mass is identified. There is mild fullness in the region of the distal esophagus and GE junction with some interstitial changes in the surrounding fat. This is likely radiation change. No discrete recurrent mass is identified. However, it does appear slightly more prominent than the prior PET-CT and endoscopic evaluation may be helpful. Lungs/Pleura: No suspicious lung lesions to suggest metastatic disease. No infiltrates, edema or effusions. Patchy bibasilar atelectasis is noted along with basilar scarring changes. Musculoskeletal: No significant bony findings. Stable degenerative changes involving the spine and stable osteoporosis. Remote T12 vertebral augmentation changes. CT ABDOMEN PELVIS FINDINGS Hepatobiliary: No worrisome hepatic lesions to suggest metastatic disease. The gallbladder is surgically absent. No common bile duct dilatation. Pancreas: Moderate pancreatic atrophy but no mass or acute inflammation. Spleen: Normal size.  No focal lesions. Adrenals/Urinary Tract: The adrenal glands and kidneys are unremarkable and stable. No renal lesions or hydronephrosis. The bladder appears normal. Stomach/Bowel: The stomach is not well distended with contrast but no gastric mass is identified. The  duodenum, small bowel and colon are unremarkable. No acute inflammatory changes, mass lesions or obstructive findings. Advanced sigmoid colon diverticulosis is again noted but no findings for acute diverticulitis. Vascular/Lymphatic: Moderate tortuosity and  calcification of the thoracic aorta but no aneurysm or dissection. The branch vessels are patent. The major venous structures are patent. No gastrohepatic ligament, celiac axis or retroperitoneal adenopathy. Reproductive: Enlarged prostate gland. The seminal vesicles appear normal. Other: No pelvic mass or adenopathy. No free pelvic fluid collections. No inguinal mass or adenopathy. No abdominal wall hernia or subcutaneous lesions. Musculoskeletal: No significant bony findings. Stable degenerative changes involving the spine. IMPRESSION: 1. Soft tissue fullness in the distal esophagus/GE junction region. This appears slightly more prominent than the prior PET-CT from 05/20/2018. I do not see a discrete mass and this could be progressive radiation change but endoscopic evaluation may be helpful for further evaluation and to exclude recurrent infiltrating tumor. 2. No adenopathy or other findings for metastatic disease involving the chest, abdomen or pelvis. 3. Stable age related atherosclerotic calcifications involving the thoracic and abdominal aorta and coronary arteries. Electronically Signed   By: Marijo Sanes M.D.   On: 03/02/2019 12:53   Dg Esophagus W Double Cm (hd)  Result Date: 02/27/2019 CLINICAL DATA:  Dysphagia EXAM: ESOPHOGRAM / BARIUM SWALLOW / BARIUM TABLET STUDY TECHNIQUE: Combined double contrast and single contrast examination performed using effervescent crystals, thick barium liquid, and thin barium liquid. The patient was observed with fluoroscopy swallowing a 13 mm barium sulphate tablet. FLUOROSCOPY TIME:  Fluoroscopy Time:  0.9 minute Radiation Exposure Index (if provided by the fluoroscopic device): 6 mGy Number of Acquired Spot Images: 0 COMPARISON:  PET-CT 05/20/2018 FINDINGS: There was normal pharyngeal anatomy and motility. There is irregular narrowing of the distal esophagus just proximal to the esophagogastric junction measuring approximately 2 cm in length. A barium tablet was not  administered as the stricture is apparent and did not appear as though the tablet would transit through this segment. There was otherwise normal esophageal mucosa without evidence of irregularity or ulceration. Esophageal motility was normal. Mild gastroesophageal reflux. No definite hiatal hernia was demonstrated. IMPRESSION: 1. Irregular narrowing of the distal esophagus just proximal to the esophagogastric junction measuring approximately 2 cm in length. This is of indeterminate etiology and can be seen with an inflammatory stricture versus neoplastic stricture. Electronically Signed   By: Kathreen Devoid   On: 02/27/2019 09:43     Assessment and plan- Patient is a 85 y.o. male with h/o stage 2 esophageal cancer and signet cell carcinoma of the stomach  I have reviewed ct chest abdomen plevis images independently. There is currently no overt evidence of recurrence/ metastatic disease. Will continue watchful monitoring at this time and consider repeat scans in 6 months  Dysphagia- barium swallow distal esophageal stricture- neoplastic versus inflammatory. He is already on max doses of protonix. I did touch base with Dr. Vicente Males for consideration for repeat EGD to ascertain etiology of stricture/ possible stent. He is able to swallow soft foods at this time  I will see him back in 1 month  Family has been updated over phone today   Visit Diagnosis 1. Dysphagia, unspecified type   2. Goals of care, counseling/discussion   3. Signet-ring cell carcinoma of stomach (HCC)   4. Esophageal cancer, stage IIB (HCC)      Dr. Randa Evens, MD, MPH [facility] [phone] 03/09/2019 1:20 PM

## 2019-03-12 ENCOUNTER — Other Ambulatory Visit: Payer: Self-pay | Admitting: Family Medicine

## 2019-03-12 ENCOUNTER — Ambulatory Visit: Payer: Medicare Other | Admitting: Radiation Oncology

## 2019-03-16 ENCOUNTER — Encounter: Payer: Self-pay | Admitting: Gastroenterology

## 2019-03-16 ENCOUNTER — Ambulatory Visit: Payer: Medicare Other | Admitting: Gastroenterology

## 2019-03-16 ENCOUNTER — Other Ambulatory Visit: Payer: Self-pay

## 2019-03-16 VITALS — BP 152/81 | HR 81 | Temp 98.1°F | Ht 63.0 in | Wt 136.6 lb

## 2019-03-16 DIAGNOSIS — R1310 Dysphagia, unspecified: Secondary | ICD-10-CM

## 2019-03-16 NOTE — Progress Notes (Signed)
[name] MD, MRCP(U.K) [address]  [place]  [place], [place] [address]  Main: [phone]  Fax: [phone]   Primary Care Physician: Tonia Ghent, MD  Primary Gastroenterologist:  Dr. [name]   Dysphagia  HPI: Aaron Becker is a 85 y.o. male   Summary of history : I had initially seen him in 05/2017 when he initially had an EGD which found an ulcerated lesion of the distal esophagus.  Biopsy of which revealed a poorly differentiated adenocarcinoma.  Undergone chemo and radiation which he completed in December 2018.  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  He is under surveillance with oncology.   Interval history   05/13/2018 to 03/16/2019  I am seeing him today for difficulty with swallowing.  Barium study he had 02/27/2019 shows irregular narrowing of the distal esophagus just proximal to the EG junction measuring 2 cm in length.  CT scan of the chest shows soft tissue fullness in the distal esophagus GE junction region.  Slightly more prominent than prior PET/CT from 05/13/2018.  Evaluation with endoscopy has been recommended.  He is here today with his wife and says that for the past few weeks, the food comes up as soon as he eats it. He is a poor historian and his wife helped with the history.    Current Outpatient Medications  Medication Sig Dispense Refill  . acetaminophen (TYLENOL) 500 MG tablet Take 500-1,000 mg by mouth every 6 (six) hours as needed (for pain.).    Marland Kitchen Ascorbic Acid (VITAMIN C) 1000 MG tablet Take 1,000 mg by mouth daily.    . benzonatate (TESSALON) 100 MG capsule Take 100 mg by mouth at bedtime as needed for cough.    . Blood Glucose Monitoring Suppl (ONE TOUCH ULTRA 2) w/Device KIT Use once to twice daily as needed Dx E11.9 1 each 0  . Chlorpheniramine-DM (CORICIDIN HBP COUGH/COLD PO) Take by mouth 4 (four) times daily as needed.    . diphenhydrAMINE (BENADRYL) 25 mg capsule Take 25 mg by  mouth at bedtime as needed (for sleep.).     Marland Kitchen docusate sodium (COLACE) 100 MG capsule Take 100 mg by mouth 2 (two) times daily as needed (for constipation).    . furosemide (LASIX) 20 MG tablet Take 1-2 tablets (20-40 mg total) by mouth daily as needed for fluid.    Marland Kitchen glimepiride (AMARYL) 1 MG tablet TAKE 2 TABLETS(2 MG) BY MOUTH DAILY WITH BREAKFAST 180 tablet 3  . glucose blood (ONE TOUCH ULTRA TEST) test strip TO TEST BLOOD SUGAR ONCE OR TWICE DAILY AND AS DIRECTED DX E11.9.  H/o variable blood sugars. 200 each 1  . levothyroxine (SYNTHROID, LEVOTHROID) 75 MCG tablet TAKE 1 TABLET(75 MCG) BY MOUTH DAILY 90 tablet 1  . metFORMIN (GLUCOPHAGE) 500 MG tablet TAKE 1 TABLET BY MOUTH EVERY MORNING AND 2 TABLETS BY MOUTH AT SUPPER AND 1 TABLET AT NIGHT IF NEEDED 360 tablet 0  . Multiple Vitamin (MULTIVITAMIN WITH MINERALS) TABS tablet Take 1 tablet by mouth daily. Centrum Silver    . ondansetron (ZOFRAN) 4 MG tablet Take 4 mg by mouth every 8 (eight) hours as needed for nausea or vomiting.    . pantoprazole (PROTONIX) 40 MG tablet Take 1 tablet (40 mg total) by mouth 2 (two) times daily. 180 tablet 3  . pravastatin (PRAVACHOL) 80 MG tablet TAKE 1 TABLET(80 MG) BY MOUTH AT BEDTIME 90 tablet 3  . pyridOXINE (  VITAMIN B-6) 100 MG tablet Take 100 mg by mouth 3 (three) times a week.      No current facility-administered medications for this visit.     Allergies as of 03/16/2019 - Review Complete 03/05/2019  Allergen Reaction Noted  . Flomax [tamsulosin hcl] Other (See Comments) 07/26/2014  . Ibuprofen Other (See Comments) 02/02/2015  . Rosuvastatin  01/23/2007  . Sulfa antibiotics Rash 07/07/2014  . Sulfonamide derivatives Rash 01/23/2007    ROS:  General: Negative for anorexia, weight loss, fever, chills, fatigue, weakness. ENT: Negative for hoarseness, difficulty swallowing , nasal congestion. CV: Negative for chest pain, angina, palpitations, dyspnea on exertion, peripheral edema.   Respiratory: Negative for dyspnea at rest, dyspnea on exertion, cough, sputum, wheezing.  GI: See history of present illness. GU:  Negative for dysuria, hematuria, urinary incontinence, urinary frequency, nocturnal urination.  Endo: Negative for unusual weight change.    Physical Examination:   There were no vitals taken for this visit.  General: Well-nourished, well-developed in no acute distress.  Eyes: No icterus. Conjunctivae pink. Mouth: Oropharyngeal mucosa moist and pink , no lesions erythema or exudate. Lungs: Clear to auscultation bilaterally. Non-labored. Heart: Regular rate and rhythm, no murmurs rubs or gallops.  Abdomen: Bowel sounds are normal, nontender, nondistended, no hepatosplenomegaly or masses, no abdominal bruits or hernia , no rebound or guarding.   Extremities: No lower extremity edema. No clubbing or deformities. Neuro: Alert and oriented x 3.  Grossly intact. Skin: Warm and dry, no jaundice.   Psych: Alert and cooperative, normal mood and affect.   Imaging Studies: Ct Chest W Contrast  Result Date: 03/02/2019 CLINICAL DATA:  Four-day history of difficulty swallowing, nausea and vomiting. History of esophageal cancer status post chemotherapy and radiation therapy. EXAM: CT CHEST, ABDOMEN, AND PELVIS WITH CONTRAST TECHNIQUE: Multidetector CT imaging of the chest, abdomen and pelvis was performed following the standard protocol during bolus administration of intravenous contrast. CONTRAST:  57m OMNIPAQUE IOHEXOL 300 MG/ML  SOLN COMPARISON:  Multiple prior PET CTs from 2019 and 2018 FINDINGS: CT CHEST FINDINGS Cardiovascular: The heart is normal in size for age. No pericardial effusion. There is moderate tortuosity, ectasia and calcification of the thoracic aorta but no focal aneurysm or dissection. The branch vessels are patent. Scattered coronary artery calcifications are noted. Mediastinum/Nodes: No mediastinal or hilar mass or lymphadenopathy. The esophagus is  unremarkable. No esophageal mass is identified. There is mild fullness in the region of the distal esophagus and GE junction with some interstitial changes in the surrounding fat. This is likely radiation change. No discrete recurrent mass is identified. However, it does appear slightly more prominent than the prior PET-CT and endoscopic evaluation may be helpful. Lungs/Pleura: No suspicious lung lesions to suggest metastatic disease. No infiltrates, edema or effusions. Patchy bibasilar atelectasis is noted along with basilar scarring changes. Musculoskeletal: No significant bony findings. Stable degenerative changes involving the spine and stable osteoporosis. Remote T12 vertebral augmentation changes. CT ABDOMEN PELVIS FINDINGS Hepatobiliary: No worrisome hepatic lesions to suggest metastatic disease. The gallbladder is surgically absent. No common bile duct dilatation. Pancreas: Moderate pancreatic atrophy but no mass or acute inflammation. Spleen: Normal size.  No focal lesions. Adrenals/Urinary Tract: The adrenal glands and kidneys are unremarkable and stable. No renal lesions or hydronephrosis. The bladder appears normal. Stomach/Bowel: The stomach is not well distended with contrast but no gastric mass is identified. The duodenum, small bowel and colon are unremarkable. No acute inflammatory changes, mass lesions or obstructive findings. Advanced sigmoid colon diverticulosis  is again noted but no findings for acute diverticulitis. Vascular/Lymphatic: Moderate tortuosity and calcification of the thoracic aorta but no aneurysm or dissection. The branch vessels are patent. The major venous structures are patent. No gastrohepatic ligament, celiac axis or retroperitoneal adenopathy. Reproductive: Enlarged prostate gland. The seminal vesicles appear normal. Other: No pelvic mass or adenopathy. No free pelvic fluid collections. No inguinal mass or adenopathy. No abdominal wall hernia or subcutaneous lesions.  Musculoskeletal: No significant bony findings. Stable degenerative changes involving the spine. IMPRESSION: 1. Soft tissue fullness in the distal esophagus/GE junction region. This appears slightly more prominent than the prior PET-CT from 05/20/2018. I do not see a discrete mass and this could be progressive radiation change but endoscopic evaluation may be helpful for further evaluation and to exclude recurrent infiltrating tumor. 2. No adenopathy or other findings for metastatic disease involving the chest, abdomen or pelvis. 3. Stable age related atherosclerotic calcifications involving the thoracic and abdominal aorta and coronary arteries. Electronically Signed   By: Marijo Sanes M.D.   On: 03/02/2019 12:53   Ct Abdomen Pelvis W Contrast  Result Date: 03/02/2019 CLINICAL DATA:  Four-day history of difficulty swallowing, nausea and vomiting. History of esophageal cancer status post chemotherapy and radiation therapy. EXAM: CT CHEST, ABDOMEN, AND PELVIS WITH CONTRAST TECHNIQUE: Multidetector CT imaging of the chest, abdomen and pelvis was performed following the standard protocol during bolus administration of intravenous contrast. CONTRAST:  29m OMNIPAQUE IOHEXOL 300 MG/ML  SOLN COMPARISON:  Multiple prior PET CTs from 2019 and 2018 FINDINGS: CT CHEST FINDINGS Cardiovascular: The heart is normal in size for age. No pericardial effusion. There is moderate tortuosity, ectasia and calcification of the thoracic aorta but no focal aneurysm or dissection. The branch vessels are patent. Scattered coronary artery calcifications are noted. Mediastinum/Nodes: No mediastinal or hilar mass or lymphadenopathy. The esophagus is unremarkable. No esophageal mass is identified. There is mild fullness in the region of the distal esophagus and GE junction with some interstitial changes in the surrounding fat. This is likely radiation change. No discrete recurrent mass is identified. However, it does appear slightly more  prominent than the prior PET-CT and endoscopic evaluation may be helpful. Lungs/Pleura: No suspicious lung lesions to suggest metastatic disease. No infiltrates, edema or effusions. Patchy bibasilar atelectasis is noted along with basilar scarring changes. Musculoskeletal: No significant bony findings. Stable degenerative changes involving the spine and stable osteoporosis. Remote T12 vertebral augmentation changes. CT ABDOMEN PELVIS FINDINGS Hepatobiliary: No worrisome hepatic lesions to suggest metastatic disease. The gallbladder is surgically absent. No common bile duct dilatation. Pancreas: Moderate pancreatic atrophy but no mass or acute inflammation. Spleen: Normal size.  No focal lesions. Adrenals/Urinary Tract: The adrenal glands and kidneys are unremarkable and stable. No renal lesions or hydronephrosis. The bladder appears normal. Stomach/Bowel: The stomach is not well distended with contrast but no gastric mass is identified. The duodenum, small bowel and colon are unremarkable. No acute inflammatory changes, mass lesions or obstructive findings. Advanced sigmoid colon diverticulosis is again noted but no findings for acute diverticulitis. Vascular/Lymphatic: Moderate tortuosity and calcification of the thoracic aorta but no aneurysm or dissection. The branch vessels are patent. The major venous structures are patent. No gastrohepatic ligament, celiac axis or retroperitoneal adenopathy. Reproductive: Enlarged prostate gland. The seminal vesicles appear normal. Other: No pelvic mass or adenopathy. No free pelvic fluid collections. No inguinal mass or adenopathy. No abdominal wall hernia or subcutaneous lesions. Musculoskeletal: No significant bony findings. Stable degenerative changes involving the spine. IMPRESSION: 1.  Soft tissue fullness in the distal esophagus/GE junction region. This appears slightly more prominent than the prior PET-CT from 05/20/2018. I do not see a discrete mass and this could be  progressive radiation change but endoscopic evaluation may be helpful for further evaluation and to exclude recurrent infiltrating tumor. 2. No adenopathy or other findings for metastatic disease involving the chest, abdomen or pelvis. 3. Stable age related atherosclerotic calcifications involving the thoracic and abdominal aorta and coronary arteries. Electronically Signed   By: Marijo Sanes M.D.   On: 03/02/2019 12:53   Dg Esophagus W Double Cm (hd)  Result Date: 02/27/2019 CLINICAL DATA:  Dysphagia EXAM: ESOPHOGRAM / BARIUM SWALLOW / BARIUM TABLET STUDY TECHNIQUE: Combined double contrast and single contrast examination performed using effervescent crystals, thick barium liquid, and thin barium liquid. The patient was observed with fluoroscopy swallowing a 13 mm barium sulphate tablet. FLUOROSCOPY TIME:  Fluoroscopy Time:  0.9 minute Radiation Exposure Index (if provided by the fluoroscopic device): 6 mGy Number of Acquired Spot Images: 0 COMPARISON:  PET-CT 05/20/2018 FINDINGS: There was normal pharyngeal anatomy and motility. There is irregular narrowing of the distal esophagus just proximal to the esophagogastric junction measuring approximately 2 cm in length. A barium tablet was not administered as the stricture is apparent and did not appear as though the tablet would transit through this segment. There was otherwise normal esophageal mucosa without evidence of irregularity or ulceration. Esophageal motility was normal. Mild gastroesophageal reflux. No definite hiatal hernia was demonstrated. IMPRESSION: 1. Irregular narrowing of the distal esophagus just proximal to the esophagogastric junction measuring approximately 2 cm in length. This is of indeterminate etiology and can be seen with an inflammatory stricture versus neoplastic stricture. Electronically Signed   By: Kathreen Devoid   On: 02/27/2019 09:43    Assessment and Plan:   Aaron Becker is a 85 y.o. y/o male here to see me for dysphagia .  S/p Chemo and RT for a poorly differentiated adenocarcinoma. CT chest and barium swallow show abnormal GE junction.  Plan  1. EGD on Friday   I have discussed alternative options, risks & benefits,  which include, but are not limited to, bleeding, infection, perforation,respiratory complication & drug reaction.  The patient agrees with this plan & written consent will be obtained.      Dr [name]  MD,MRCP [facility]) Follow up PRN

## 2019-03-17 ENCOUNTER — Other Ambulatory Visit
Admission: RE | Admit: 2019-03-17 | Discharge: 2019-03-17 | Disposition: A | Discharge status: Home / Self Care | Payer: Medicare Other | Source: Ambulatory Visit | Attending: Gastroenterology | Admitting: Gastroenterology

## 2019-03-17 DIAGNOSIS — Z1159 Encounter for screening for other viral diseases: Secondary | ICD-10-CM | POA: Diagnosis not present

## 2019-03-19 LAB — NOVEL CORONAVIRUS, NAA (HOSP ORDER, SEND-OUT TO REF LAB; TAT 18-24 HRS): SARS-CoV-2, NAA: NOT DETECTED

## 2019-03-20 ENCOUNTER — Ambulatory Visit: Payer: Medicare Other | Admitting: Anesthesiology

## 2019-03-20 ENCOUNTER — Encounter: Payer: Self-pay | Admitting: *Deleted

## 2019-03-20 ENCOUNTER — Encounter
Admission: RE | Disposition: A | Discharge status: Home / Self Care | Payer: Self-pay | Source: Home / Self Care | Attending: Gastroenterology

## 2019-03-20 ENCOUNTER — Ambulatory Visit
Admission: RE | Admit: 2019-03-20 | Discharge: 2019-03-20 | Disposition: A | Discharge status: Home / Self Care | Payer: Medicare Other | Attending: Gastroenterology | Admitting: Gastroenterology

## 2019-03-20 ENCOUNTER — Other Ambulatory Visit: Payer: Self-pay

## 2019-03-20 ENCOUNTER — Ambulatory Visit: Payer: Medicare Other | Admitting: Oncology

## 2019-03-20 DIAGNOSIS — R002 Palpitations: Secondary | ICD-10-CM | POA: Insufficient documentation

## 2019-03-20 DIAGNOSIS — Z882 Allergy status to sulfonamides status: Secondary | ICD-10-CM | POA: Insufficient documentation

## 2019-03-20 DIAGNOSIS — Z800 Family history of malignant neoplasm of digestive organs: Secondary | ICD-10-CM | POA: Insufficient documentation

## 2019-03-20 DIAGNOSIS — M19042 Primary osteoarthritis, left hand: Secondary | ICD-10-CM | POA: Insufficient documentation

## 2019-03-20 DIAGNOSIS — Z9049 Acquired absence of other specified parts of digestive tract: Secondary | ICD-10-CM | POA: Insufficient documentation

## 2019-03-20 DIAGNOSIS — Z9221 Personal history of antineoplastic chemotherapy: Secondary | ICD-10-CM | POA: Diagnosis not present

## 2019-03-20 DIAGNOSIS — Z923 Personal history of irradiation: Secondary | ICD-10-CM | POA: Insufficient documentation

## 2019-03-20 DIAGNOSIS — Z8501 Personal history of malignant neoplasm of esophagus: Secondary | ICD-10-CM | POA: Insufficient documentation

## 2019-03-20 DIAGNOSIS — Z808 Family history of malignant neoplasm of other organs or systems: Secondary | ICD-10-CM | POA: Insufficient documentation

## 2019-03-20 DIAGNOSIS — C160 Malignant neoplasm of cardia: Secondary | ICD-10-CM | POA: Diagnosis not present

## 2019-03-20 DIAGNOSIS — E785 Hyperlipidemia, unspecified: Secondary | ICD-10-CM | POA: Insufficient documentation

## 2019-03-20 DIAGNOSIS — Z79899 Other long term (current) drug therapy: Secondary | ICD-10-CM | POA: Insufficient documentation

## 2019-03-20 DIAGNOSIS — R1310 Dysphagia, unspecified: Secondary | ICD-10-CM | POA: Diagnosis not present

## 2019-03-20 DIAGNOSIS — Z86010 Personal history of colonic polyps: Secondary | ICD-10-CM | POA: Insufficient documentation

## 2019-03-20 DIAGNOSIS — Z888 Allergy status to other drugs, medicaments and biological substances status: Secondary | ICD-10-CM | POA: Insufficient documentation

## 2019-03-20 DIAGNOSIS — C155 Malignant neoplasm of lower third of esophagus: Secondary | ICD-10-CM | POA: Diagnosis not present

## 2019-03-20 DIAGNOSIS — Z803 Family history of malignant neoplasm of breast: Secondary | ICD-10-CM | POA: Insufficient documentation

## 2019-03-20 DIAGNOSIS — M19041 Primary osteoarthritis, right hand: Secondary | ICD-10-CM | POA: Insufficient documentation

## 2019-03-20 DIAGNOSIS — K222 Esophageal obstruction: Secondary | ICD-10-CM | POA: Diagnosis not present

## 2019-03-20 DIAGNOSIS — Z8719 Personal history of other diseases of the digestive system: Secondary | ICD-10-CM | POA: Diagnosis not present

## 2019-03-20 DIAGNOSIS — H9193 Unspecified hearing loss, bilateral: Secondary | ICD-10-CM | POA: Insufficient documentation

## 2019-03-20 DIAGNOSIS — E119 Type 2 diabetes mellitus without complications: Secondary | ICD-10-CM | POA: Insufficient documentation

## 2019-03-20 DIAGNOSIS — I471 Supraventricular tachycardia: Secondary | ICD-10-CM | POA: Diagnosis not present

## 2019-03-20 DIAGNOSIS — E039 Hypothyroidism, unspecified: Secondary | ICD-10-CM | POA: Diagnosis not present

## 2019-03-20 DIAGNOSIS — I10 Essential (primary) hypertension: Secondary | ICD-10-CM | POA: Diagnosis not present

## 2019-03-20 DIAGNOSIS — Z9841 Cataract extraction status, right eye: Secondary | ICD-10-CM | POA: Diagnosis not present

## 2019-03-20 DIAGNOSIS — Z8249 Family history of ischemic heart disease and other diseases of the circulatory system: Secondary | ICD-10-CM | POA: Insufficient documentation

## 2019-03-20 DIAGNOSIS — Z9842 Cataract extraction status, left eye: Secondary | ICD-10-CM | POA: Diagnosis not present

## 2019-03-20 HISTORY — PX: ESOPHAGOGASTRODUODENOSCOPY (EGD) WITH PROPOFOL: SHX5813

## 2019-03-20 SURGERY — ESOPHAGOGASTRODUODENOSCOPY (EGD) WITH PROPOFOL
Anesthesia: General

## 2019-03-20 MED ORDER — SODIUM CHLORIDE 0.9 % IV SOLN
INTRAVENOUS | Status: DC
  Administered 2019-03-20: 1000 mL via INTRAVENOUS
  Administered 2019-03-20: 10:00:00 via INTRAVENOUS

## 2019-03-20 MED ORDER — PROPOFOL 500 MG/50ML IV EMUL
INTRAVENOUS | Status: DC | PRN
  Administered 2019-03-20: 50 ug/kg/min via INTRAVENOUS

## 2019-03-20 MED ORDER — PROPOFOL 10 MG/ML IV BOLUS
INTRAVENOUS | Status: DC | PRN
  Administered 2019-03-20 (×3): 10 mg via INTRAVENOUS

## 2019-03-20 MED ORDER — LIDOCAINE HCL (PF) 2 % IJ SOLN
INTRAMUSCULAR | Status: DC | PRN
  Administered 2019-03-20: 60 mg

## 2019-03-20 NOTE — Op Note (Signed)
[facility] Gastroenterology Patient Name: Aaron Becker Procedure Date: 03/20/2019 10:03 AM MRN: [id number] Account #: [id number] Date of Birth: March 06, 1930 Admit Type: Outpatient Age: 85 [facility] Gender: Male Note Status: Finalized Procedure:            Upper GI endoscopy Indications:          Dysphagia Providers:            [name] MD, MD Referring MD:         Elveria Rising. Damita Dunnings, MD (Referring MD) Medicines:            Monitored Anesthesia Care Complications:        No immediate complications. Procedure:            Pre-Anesthesia Assessment:                       - Prior to the procedure, a History and Physical was                        performed, and patient medications, allergies and                        sensitivities were reviewed. The patient's tolerance of                        previous anesthesia was reviewed.                       - The risks and benefits of the procedure and the                        sedation options and risks were discussed with the                        patient. All questions were answered and informed                        consent was obtained.                       - ASA Grade Assessment: III - A patient with severe                        systemic disease.                       After obtaining informed consent, the endoscope was                        passed under direct vision. Throughout the procedure,                        the patient's blood pressure, pulse, and oxygen                        saturations were monitored continuously. The Endoscope                        was introduced through the mouth, and advanced to the  third part of duodenum. The upper GI endoscopy was                        accomplished with ease. The patient tolerated the                        procedure well. Findings:      The examined duodenum was normal.      The stomach was normal.      One  malignant-appearing, intrinsic moderate (circumferential scarring or       stenosis; an endoscope may pass) stenosis was found in the lower third       of the esophagus. This stenosis measured 1 cm (inner diameter) x 3 cm       (in length). The stenosis was traversed by gradually pushing the3 scope       down which in the process dilated the narrowing . Biopsies were taken       with a cold forceps for histology.      Diffuse moderate mucosal changes characterized by altered texture were       found in the cardia. Biopsies were taken with a cold forceps for       histology. Biopsies of the cardia were placed in the same bottle as rthe       abnormal mucosa extended from the lower part of the esophagus, GE       junction and beyond Impression:           - Normal examined duodenum.                       - Normal stomach.                       - Malignant-appearing esophageal stenosis. Biopsied.                       - Texture changed mucosa in the cardia. Biopsied. Recommendation:       - Discharge patient to home (with escort).                       - Mechanical soft diet.                       - Continue present medications.                       - options for dysphagia are radiation vs a 7 cm                        partially covered stent - issues with stent would be                        migration as well as severe acid reflux. would thinkl                        radiation would be a better choice Procedure Code(s):    --- Professional ---                       [phone], Esophagogastroduodenoscopy, flexible, transoral;  with biopsy, single or multiple Diagnosis Code(s):    --- Professional ---                       K22.2, Esophageal obstruction                       R13.10, Dysphagia, unspecified                       K31.89, Other diseases of stomach and duodenum CPT copyright 2019 [employer]. All rights reserved. The codes documented in this  report are preliminary and upon coder review may  be revised to meet current compliance requirements. [name], MD [name] MD, MD 03/20/2019 10:19:10 AM This report has been signed electronically. Number of Addenda: 0 Note Initiated On: 03/20/2019 10:03 AM Estimated Blood Loss: Estimated blood loss: none.      [facility]

## 2019-03-20 NOTE — Anesthesia Postprocedure Evaluation (Signed)
Anesthesia Post Note  Patient: Aaron Becker  Procedure(s) Performed: ESOPHAGOGASTRODUODENOSCOPY (EGD) WITH PROPOFOL (N/A )  Patient location during evaluation: PACU Anesthesia Type: General Level of consciousness: awake and alert Pain management: pain level controlled Vital Signs Assessment: post-procedure vital signs reviewed and stable Respiratory status: spontaneous breathing, nonlabored ventilation and respiratory function stable Cardiovascular status: blood pressure returned to baseline and stable Postop Assessment: no apparent nausea or vomiting Anesthetic complications: no     Last Vitals:  Vitals:   03/20/19 1033 03/20/19 1042  BP: (!) 144/92 (!) 177/76  Pulse: 69 72  Resp: 20 (!) 23  Temp:    SpO2: 99% 100%    Last Pain:  Vitals:   03/20/19 1042  TempSrc:   PainSc: 0-No pain                 Durenda Hurt

## 2019-03-20 NOTE — Transfer of Care (Signed)
Immediate Anesthesia Transfer of Care Note  Patient: Shea E Lama  Procedure(s) Performed: ESOPHAGOGASTRODUODENOSCOPY (EGD) WITH PROPOFOL (N/A )  Patient Location: PACU  Anesthesia Type:General  Level of Consciousness: sedated  Airway & Oxygen Therapy: Patient Spontanous Breathing and Patient connected to nasal cannula oxygen  Post-op Assessment: Report given to RN and Post -op Vital signs reviewed and stable  Post vital signs: Reviewed and stable  Last Vitals:  Vitals Value Taken Time  BP    Temp 36.4 C 03/20/19 1024  Pulse 66 03/20/19 1025  Resp 22 03/20/19 1025  SpO2 94 % 03/20/19 1025  Vitals shown include unvalidated device data.  Last Pain:  Vitals:   03/20/19 1024  TempSrc: Tympanic  PainSc:          Complications: No apparent anesthesia complications

## 2019-03-20 NOTE — H&P (Signed)
Aaron Bellows, MD [address], [place], [place], [place], [address] [id number], [address], [place], [place], [address] Phone: [phone]  Fax: [phone]  Primary Care Physician:  Aaron Ghent, MD   Pre-Procedure History & Physical: HPI:  Aaron Becker is a 85 y.o. male is here for an endoscopy    Past Medical History:  Diagnosis Date  . Anemia   . Arthritis    hands  . Bradycardia   . Colon polyp   . Diabetes mellitus, type II ([place]) 1986   oral meds  . Elevated PSA 2011   per Dr. Jacqlyn Larsen with Uro no problems last 5 or 6 yrs  . Esophageal cancer ([place]) dx sept 18 2018  . HOH (hard of hearing)    bilateral AIDES  . Hyperlipidemia   . Hypertension 02/02   off bp meds since hospitalization 3 weeks ago  . Hypothyroidism 1980's  . Palpitations   . Right rib fracture 2015  . SVT (supraventricular tachycardia) ([place])    a. reported SVT during admission at [facility] 06/2014  . Syncope and collapse    none recent last few weeks  . Wears dentures    upper and lower    Past Surgical History:  Procedure Laterality Date  . [place], 09/2015   COMPRESSED FRACTURE SPINE  . carotid ultrasound  02/02   wnl  . CATARACT EXTRACTION W/PHACO Right 03/19/2016   Procedure: CATARACT EXTRACTION PHACO AND INTRAOCULAR LENS PLACEMENT (IOC);  Surgeon: Aaron Freshwater, MD;  Location: [place];  Service: Ophthalmology;  Laterality: Right;  DIABETIC-oral med RIGHT  . CATARACT EXTRACTION W/PHACO Left 04/23/2016   Procedure: CATARACT EXTRACTION PHACO AND INTRAOCULAR LENS PLACEMENT (IOC);  Surgeon: Aaron Freshwater, MD;  Location: [place];  Service: Ophthalmology;  Laterality: Left;  LEFT DIABETIC - oral meds  . CHOLECYSTECTOMY  1967  . COLONOSCOPY W/ POLYPECTOMY  2012   Villous adenoma from the rectum without high-grade dysplasia, 10 mm  . COLONOSCOPY WITH PROPOFOL N/A 03/30/2015   Procedure: COLONOSCOPY WITH PROPOFOL;  Surgeon: Aaron Bellow, MD;  [facility] ENDOSCOPY;  Service: Endoscopy;  Laterality: N/A;  . CYST EXCISION     thumb  . CYSTECTOMY  06/14/04   mucous cyst excision with left thumb, IP joint debridement  . ESOPHAGOGASTRODUODENOSCOPY (EGD) WITH PROPOFOL N/A 06/11/2017   Procedure: ESOPHAGOGASTRODUODENOSCOPY (EGD) WITH PROPOFOL;  Surgeon: Aaron Lame, MD;  Location: [facility] ENDOSCOPY;  Service: Endoscopy;  Laterality: N/A;  . ESOPHAGOGASTRODUODENOSCOPY (EGD) WITH PROPOFOL N/A 12/31/2017   Procedure: ESOPHAGOGASTRODUODENOSCOPY (EGD) WITH PROPOFOL;  Surgeon: Aaron Bellows, MD;  Location: [facility] ENDOSCOPY;  Service: Endoscopy;  Laterality: N/A;  . ESOPHAGOGASTRODUODENOSCOPY (EGD) WITH PROPOFOL N/A 05/08/2018   Procedure: ESOPHAGOGASTRODUODENOSCOPY (EGD) WITH PROPOFOL;  Surgeon: Aaron Bellows, MD;  Location: [facility] ENDOSCOPY;  Service: Gastroenterology;  Laterality: N/A;  . EUS N/A 07/04/2017   Procedure: UPPER ENDOSCOPIC ULTRASOUND (EUS) RADIAL;  Surgeon: Aaron Banister, MD;  Location: [facility] ENDOSCOPY;  Service: Endoscopy;  Laterality: N/A;  . EYE SURGERY    . FLEXIBLE SIGMOIDOSCOPY N/A 06/11/2017   Procedure: FLEXIBLE SIGMOIDOSCOPY;  Surgeon: Aaron Lame, MD;  Location: [facility] ENDOSCOPY;  Service: Endoscopy;  Laterality: N/A;  . HERNIA REPAIR  2013  . KYPHOPLASTY N/A 10/06/2015   Procedure: Thoracic twelve kyphoplasty;  Surgeon: Aaron Chimera, MD;  Location: [place] NEURO ORS;  Service: Neurosurgery;  Laterality: N/A;  T12 Kyphoplasty  . PROSTATE BIOPSY  11/01/08   Dr.  Humphries  . [place]  . VASECTOMY  1975    Prior to Admission medications   Medication Sig Start Date End Date Taking? Authorizing Provider  acetaminophen (TYLENOL) 500 MG tablet Take 500-1,000 mg by mouth every 6 (six) hours as needed (for pain.).   Yes [provider]  Ascorbic Acid (VITAMIN C) 1000 MG tablet Take 1,000 mg by mouth daily.   Yes [provider]  furosemide (LASIX) 20 MG tablet Take 1-2 tablets (20-40 mg  total) by mouth daily as needed for fluid. 12/29/18  Yes Aaron Ghent, MD  glimepiride (AMARYL) 1 MG tablet TAKE 2 TABLETS(2 MG) BY MOUTH DAILY WITH BREAKFAST 03/12/19  Yes Aaron Ghent, MD  glucose blood (ONE TOUCH ULTRA TEST) test strip TO TEST BLOOD SUGAR ONCE OR TWICE DAILY AND AS DIRECTED DX E11.9.  H/o variable blood sugars. 07/02/18  Yes Aaron Ghent, MD  levothyroxine (SYNTHROID, LEVOTHROID) 75 MCG tablet TAKE 1 TABLET(75 MCG) BY MOUTH DAILY 09/19/18  Yes Aaron Ghent, MD  metFORMIN (GLUCOPHAGE) 500 MG tablet TAKE 1 TABLET BY MOUTH EVERY MORNING AND 2 TABLETS BY MOUTH AT SUPPER AND 1 TABLET AT NIGHT IF NEEDED 01/12/19  Yes Aaron Ghent, MD  Multiple Vitamin (MULTIVITAMIN WITH MINERALS) TABS tablet Take 1 tablet by mouth daily. Centrum Silver   Yes [provider]  ondansetron (ZOFRAN) 4 MG tablet Take 4 mg by mouth every 8 (eight) hours as needed for nausea or vomiting.   Yes [provider]  pantoprazole (PROTONIX) 40 MG tablet Take 1 tablet (40 mg total) by mouth 2 (two) times daily. 03/13/18  Yes Aaron Kitchens, NP  pravastatin (PRAVACHOL) 80 MG tablet TAKE 1 TABLET(80 MG) BY MOUTH AT BEDTIME 03/09/19  Yes Aaron Ghent, MD  pyridOXINE (VITAMIN B-6) 100 MG tablet Take 100 mg by mouth 3 (three) times a week.    Yes [provider]  benzonatate (TESSALON) 100 MG capsule Take 100 mg by mouth at bedtime as needed for cough.    [provider]  Blood Glucose Monitoring Suppl (ONE TOUCH ULTRA 2) w/Device KIT Use once to twice daily as needed Dx E11.9 Patient not taking: Reported on 03/20/2019 02/04/19   Aaron Ghent, MD  Chlorpheniramine-DM (CORICIDIN HBP COUGH/COLD PO) Take by mouth 4 (four) times daily as needed.    [provider]  diphenhydrAMINE (BENADRYL) 25 mg capsule Take 25 mg by mouth at bedtime as needed (for sleep.).     [provider]  docusate sodium (COLACE) 100 MG capsule Take 100 mg by mouth 2 (two) times  daily as needed (for constipation).    [provider]    Allergies as of 03/16/2019 - Review Complete 03/16/2019  Allergen Reaction Noted  . Flomax [tamsulosin hcl] Other (See Comments) 07/26/2014  . Ibuprofen Other (See Comments) 02/02/2015  . Rosuvastatin  01/23/2007  . Sulfa antibiotics Rash 07/07/2014  . Sulfonamide derivatives Rash 01/23/2007    Family History  Problem Relation Age of Onset  . Cancer Father        liver  . Cancer Sister        breast  . Cancer Brother        skin  . Cancer Sister        breast  . Heart disease Sister        pacer  . Heart disease Sister   . Cancer Sister        breast cancer  .  Heart disease Sister   . Colon cancer Neg Hx   . Prostate cancer Neg Hx     Social History   Socioeconomic History  . Marital status: Widowed    Spouse name: Not on file  . Number of children: Not on file  . Years of education: Not on file  . Highest education level: Not on file  Occupational History  . Occupation: Pharmacist, community: retired    Comment: Retired 1999  Social Needs  . Financial resource strain: Not on file  . Food insecurity    Worry: Not on file    Inability: Not on file  . Transportation needs    Medical: Not on file    Non-medical: Not on file  Tobacco Use  . Smoking status: Never Smoker  . Smokeless tobacco: Never Used  Substance and Sexual Activity  . Alcohol use: No    Alcohol/week: 0.0 standard drinks  . Drug use: Never  . Sexual activity: Never  Lifestyle  . Physical activity    Days per week: Not on file    Minutes per session: Not on file  . Stress: Not on file  Relationships  . Social Herbalist on phone: Not on file    Gets together: Not on file    Attends religious service: Not on file    Active member of club or organization: Not on file    Attends meetings of clubs or organizations: Not on file    Relationship status: Not on file  . Intimate partner  violence    Fear of current or ex partner: Not on file    Emotionally abused: Not on file    Physically abused: Not on file    Forced sexual activity: Not on file  Other Topics Concern  . Not on file  Social History Narrative   Mar[place]- widower 2000 ([place]), not marred but with partner since 2001.    Retired from [employer].     [employer] '[employer], stationed in [place]: See HPI, otherwise negative ROS  Physical Exam: BP (!) 160/72   Pulse 74   Temp (!) 96.8 F (36 C) (Tympanic)   Resp 18   Ht _0  (1.6 m)   Wt 59 kg   SpO2 100%   BMI 23.03 kg/m  General:   Alert,  pleasant and cooperative in NAD Head:  Normocephalic and atraumatic. Neck:  Supple; no masses or thyromegaly. Lungs:  Clear throughout to auscultation, normal respiratory effort.    Heart:  +S1, +S2, Regular rate and rhythm, No edema. Abdomen:  Soft, nontender and nondistended. Normal bowel sounds, without guarding, and without rebound.   Neurologic:  Alert and  oriented x4;  grossly normal neurologically.  Impression/Plan: Aaron Becker is here for an endoscopy  to be performed for  evaluation of dysphagia    Risks, benefits, limitations, and alternatives regarding endoscopy and possible dilation have been reviewed with the patient.  Questions have been answered.  All parties agreeable.   Aaron Bellows, MD  03/20/2019, 10:01 AM

## 2019-03-20 NOTE — Anesthesia Preprocedure Evaluation (Addendum)
Anesthesia Evaluation  Patient identified by MRN, date of birth, ID band Patient awake    Reviewed: Allergy & Precautions, H&P , NPO status , Patient's Chart, lab work & pertinent test results  Airway Mallampati: II  TM Distance: >3 FB     Dental  (+) Upper Dentures, Lower Dentures   Pulmonary neg pulmonary ROS, neg COPD,           Cardiovascular hypertension, (-) angina(-) Past MI      Neuro/Psych negative neurological ROS  negative psych ROS   GI/Hepatic negative GI ROS, Neg liver ROS,   Endo/Other  diabetesHypothyroidism   Renal/GU negative Renal ROS  negative genitourinary   Musculoskeletal   Abdominal   Peds  Hematology  (+) Blood dyscrasia, anemia ,   Anesthesia Other Findings Past Medical History: No date: Anemia No date: Arthritis     Comment:  hands No date: Bradycardia No date: Colon polyp 1986: Diabetes mellitus, type II ([facility])     Comment:  oral meds 2011: Elevated PSA     Comment:  per Dr. Jacqlyn Larsen with Uro no problems last 5 or 6 yrs dx sept 18 2018: Esophageal cancer ([place]) No date: HOH (hard of hearing)     Comment:  bilateral AIDES No date: Hyperlipidemia 02/02: Hypertension     Comment:  off bp meds since hospitalization 3 weeks ago 1980's: Hypothyroidism No date: Palpitations 2015: Right rib fracture No date: SVT (supraventricular tachycardia) ([place])     Comment:  a. reported SVT during admission at [facility] 06/2014 No date: Syncope and collapse     Comment:  none recent last few weeks No date: Wears dentures     Comment:  upper and lower  Past Surgical History:  1978, 09/2015: BACK SURGERY     Comment:  COMPRESSED FRACTURE SPINE 02/02: carotid ultrasound     Comment:  wnl 03/19/2016: CATARACT EXTRACTION W/PHACO; Right     Comment:  Procedure: CATARACT EXTRACTION PHACO AND INTRAOCULAR               LENS PLACEMENT (IOC);  Surgeon: Ronnell Freshwater,              MD;  Location: [place];  Service:               Ophthalmology;  Laterality: Right;  DIABETIC-oral               med RIGHT 04/23/2016: CATARACT EXTRACTION W/PHACO; Left     Comment:  Procedure: CATARACT EXTRACTION PHACO AND INTRAOCULAR               LENS PLACEMENT (IOC);  Surgeon: Ronnell Freshwater,              MD;  Location: [place];  Service:               Ophthalmology;  Laterality: Left;  LEFT DIABETIC - oral               meds 1967: CHOLECYSTECTOMY 2012: COLONOSCOPY W/ POLYPECTOMY     Comment:  Villous adenoma from the rectum without high-grade               dysplasia, 10 mm 03/30/2015: COLONOSCOPY WITH PROPOFOL; N/A     Comment:  Procedure: COLONOSCOPY WITH PROPOFOL;  Surgeon: Robert Bellow, MD;  Location: [facility] ENDOSCOPY;  Service:  Endoscopy;  Laterality: N/A; No date: CYST EXCISION     Comment:  thumb 06/14/04: CYSTECTOMY     Comment:  mucous cyst excision with left thumb, IP joint               debridement 06/11/2017: ESOPHAGOGASTRODUODENOSCOPY (EGD) WITH PROPOFOL; N/A     Comment:  Procedure: ESOPHAGOGASTRODUODENOSCOPY (EGD) WITH               PROPOFOL;  Surgeon: Lucilla Lame, MD;  Location: [facility]               ENDOSCOPY;  Service: Endoscopy;  Laterality: N/A; 12/31/2017: ESOPHAGOGASTRODUODENOSCOPY (EGD) WITH PROPOFOL; N/A     Comment:  Procedure: ESOPHAGOGASTRODUODENOSCOPY (EGD) WITH               PROPOFOL;  Surgeon: [name], MD;  Location: [facility]               ENDOSCOPY;  Service: Endoscopy;  Laterality: N/A; 05/08/2018: ESOPHAGOGASTRODUODENOSCOPY (EGD) WITH PROPOFOL; N/A     Comment:  Procedure: ESOPHAGOGASTRODUODENOSCOPY (EGD) WITH               PROPOFOL;  Surgeon: [name], MD;  Location: [facility]               ENDOSCOPY;  Service: Gastroenterology;  Laterality: N/A; 07/04/2017: EUS; N/A     Comment:  Procedure: UPPER ENDOSCOPIC ULTRASOUND (EUS) RADIAL;                Surgeon: [name], MD;  Location: [facility] ENDOSCOPY;                 Service: Endoscopy;  Laterality: N/A; 06/11/2017: FLEXIBLE SIGMOIDOSCOPY; N/A     Comment:  Procedure: FLEXIBLE SIGMOIDOSCOPY;  Surgeon: Lucilla Lame, MD;  Location: [facility] ENDOSCOPY;  Service:               Endoscopy;  Laterality: N/A; 2013: HERNIA REPAIR 10/06/2015: KYPHOPLASTY; N/A     Comment:  Procedure: Thoracic twelve kyphoplasty;  Surgeon: Karie Chimera, MD;  Location: [place] NEURO ORS;  Service:               Neurosurgery;  Laterality: N/A;  T12 Kyphoplasty 11/01/08: PROSTATE BIOPSY     Comment:  Dr. Reece Agar 1975: [place]: VASECTOMY     Reproductive/Obstetrics negative OB ROS                            Anesthesia Physical Anesthesia Plan  ASA: III  Anesthesia Plan: General   Post-op Pain Management:    Induction:   PONV Risk Score and Plan: Propofol infusion and TIVA  Airway Management Planned: Natural Airway and Nasal Cannula  Additional Equipment:   Intra-op Plan:   Post-operative Plan:   Informed Consent: I have reviewed the patients History and Physical, chart, labs and discussed the procedure including the risks, benefits and alternatives for the proposed anesthesia with the patient or authorized representative who has indicated his/her understanding and acceptance.     Dental Advisory Given  Plan Discussed with: Anesthesiologist and CRNA  Anesthesia Plan Comments:         Anesthesia Quick Evaluation

## 2019-03-20 NOTE — Anesthesia Post-op Follow-up Note (Signed)
Anesthesia QCDR form completed.        

## 2019-03-21 ENCOUNTER — Other Ambulatory Visit: Payer: Self-pay | Admitting: Family Medicine

## 2019-03-23 ENCOUNTER — Other Ambulatory Visit: Payer: Self-pay | Admitting: Pathology

## 2019-03-23 ENCOUNTER — Ambulatory Visit: Payer: Medicare Other | Admitting: Gastroenterology

## 2019-03-23 LAB — SURGICAL PATHOLOGY

## 2019-03-23 NOTE — Progress Notes (Unsigned)
mdt  

## 2019-03-25 ENCOUNTER — Telehealth: Payer: Self-pay | Admitting: Oncology

## 2019-03-26 ENCOUNTER — Other Ambulatory Visit: Payer: Medicare Other

## 2019-03-26 ENCOUNTER — Encounter: Payer: Self-pay | Admitting: Oncology

## 2019-03-26 ENCOUNTER — Other Ambulatory Visit: Payer: Self-pay

## 2019-03-26 ENCOUNTER — Inpatient Hospital Stay: Payer: Medicare Other | Attending: Oncology | Admitting: Oncology

## 2019-03-26 DIAGNOSIS — C169 Malignant neoplasm of stomach, unspecified: Secondary | ICD-10-CM | POA: Insufficient documentation

## 2019-03-26 DIAGNOSIS — E039 Hypothyroidism, unspecified: Secondary | ICD-10-CM | POA: Insufficient documentation

## 2019-03-26 DIAGNOSIS — Z515 Encounter for palliative care: Secondary | ICD-10-CM | POA: Insufficient documentation

## 2019-03-26 DIAGNOSIS — M1990 Unspecified osteoarthritis, unspecified site: Secondary | ICD-10-CM | POA: Insufficient documentation

## 2019-03-26 DIAGNOSIS — I2510 Atherosclerotic heart disease of native coronary artery without angina pectoris: Secondary | ICD-10-CM | POA: Insufficient documentation

## 2019-03-26 DIAGNOSIS — C159 Malignant neoplasm of esophagus, unspecified: Secondary | ICD-10-CM | POA: Diagnosis not present

## 2019-03-26 DIAGNOSIS — R634 Abnormal weight loss: Secondary | ICD-10-CM | POA: Insufficient documentation

## 2019-03-26 DIAGNOSIS — Z7984 Long term (current) use of oral hypoglycemic drugs: Secondary | ICD-10-CM | POA: Insufficient documentation

## 2019-03-26 DIAGNOSIS — E785 Hyperlipidemia, unspecified: Secondary | ICD-10-CM | POA: Insufficient documentation

## 2019-03-26 DIAGNOSIS — E860 Dehydration: Secondary | ICD-10-CM | POA: Insufficient documentation

## 2019-03-26 DIAGNOSIS — R630 Anorexia: Secondary | ICD-10-CM | POA: Insufficient documentation

## 2019-03-26 DIAGNOSIS — Z8719 Personal history of other diseases of the digestive system: Secondary | ICD-10-CM | POA: Insufficient documentation

## 2019-03-26 DIAGNOSIS — R5381 Other malaise: Secondary | ICD-10-CM | POA: Insufficient documentation

## 2019-03-26 DIAGNOSIS — Z9221 Personal history of antineoplastic chemotherapy: Secondary | ICD-10-CM | POA: Insufficient documentation

## 2019-03-26 DIAGNOSIS — Z7189 Other specified counseling: Secondary | ICD-10-CM

## 2019-03-26 DIAGNOSIS — R1319 Other dysphagia: Secondary | ICD-10-CM | POA: Insufficient documentation

## 2019-03-26 DIAGNOSIS — Z79899 Other long term (current) drug therapy: Secondary | ICD-10-CM | POA: Insufficient documentation

## 2019-03-26 DIAGNOSIS — R5383 Other fatigue: Secondary | ICD-10-CM | POA: Insufficient documentation

## 2019-03-26 DIAGNOSIS — Z923 Personal history of irradiation: Secondary | ICD-10-CM | POA: Insufficient documentation

## 2019-03-26 DIAGNOSIS — I10 Essential (primary) hypertension: Secondary | ICD-10-CM | POA: Insufficient documentation

## 2019-03-26 DIAGNOSIS — E119 Type 2 diabetes mellitus without complications: Secondary | ICD-10-CM | POA: Insufficient documentation

## 2019-03-26 MED ORDER — ONDANSETRON 4 MG PO TBDP: 4.0000 mg | ORAL_TABLET | Freq: Three times a day (TID) | ORAL | 3 refills | Status: DC | PRN

## 2019-03-26 NOTE — Progress Notes (Signed)
Tumor Board Documentation  Dhruva E Zwart was presented by Dr Janese Banks at our Tumor Board on 03/26/2019, which included representatives from medical oncology, radiation oncology, surgical, radiology, pathology, navigation, internal medicine, research, pulmonology.  Demir currently presents for discussion, for new positive pathology with history of the following treatments: active survellience, surgical intervention(s).  Additionally, we reviewed previous medical and familial history, history of present illness, and recent lab results along with all available histopathologic and imaging studies. The tumor board considered available treatment options and made the following recommendations: Radiation therapy (primary modality), Additional screening    The following procedures/referrals were also placed: No orders of the defined types were placed in this encounter.   Clinical Trial Status: not discussed   Staging used: AJCC Stage Group  AJCC Staging:       Group: Stage 2 B Adenocarcinoma of esophagus   National site-specific guidelines NCCN were discussed with respect to the case.  Tumor board is a meeting of clinicians from various specialty areas who evaluate and discuss patients for whom a multidisciplinary approach is being considered. Final determinations in the plan of care are those of the provider(s). The responsibility for follow up of recommendations given during tumor board is that of the provider.   Today's extended care, comprehensive team conference, Eliasar was not present for the discussion and was not examined.   Multidisciplinary Tumor Board is a multidisciplinary case peer review process.  Decisions discussed in the Multidisciplinary Tumor Board reflect the opinions of the specialists present at the conference without having examined the patient.  Ultimately, treatment and diagnostic decisions rest with the primary provider(s) and the patient.

## 2019-03-26 NOTE — Progress Notes (Signed)
Called patient today for Telehealth visit via [employer] with the use of daughters phone [phone].  Review of chart and assessment was helped by wife, Joaquim Lai.  Patient c/o nausea, zofran is not controlling, requesting additional medication or change in medication. No appetite, difficulty swallowing, vomiting clear mucus.

## 2019-03-29 NOTE — Progress Notes (Signed)
I connected with Aaron Becker on 03/29/19 at  1:30 PM EDT by video enabled telemedicine visit and verified that I am speaking with the correct person using two identifiers.   I discussed the limitations, risks, security and privacy concerns of performing an evaluation and management service by telemedicine and the availability of in-person appointments. I also discussed with the patient that there may be a patient responsible charge related to this service. The patient expressed understanding and agreed to proceed.  Other persons participating in the visit and their role in the encounter:  Patients family- wife and daughters  Patient's location:  home Provider's location:  work  Risk analyst Complaint:  F/u of egd results and further management  History of present illness: Patient is a 85 yr old male with h/o Stage IIB esophageal cancer T2N0 diagnosed in sept 2018. He received concurrent carbo/taxol RT for 5 weeks which h completed in dec 2018. 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 He was also seen by Dr. Fanny Skates at [facility] and plan was watchful waiting versus 5 fu single agent chemo. Patient chose watchful waiting  Patient had repeat ct scans in June 2020 for weight loss/ difficulty swallowing. Scans showed no distant metastases but increased GE junction thickening confirmed on barium swallow. He underwent EGD by Dr. Vicente Males which showed luminal narrowing by persistent tumor   Interval history: continues to have difficulty swallowing and weight loss   Review of Systems  Constitutional: Positive for malaise/fatigue and weight loss. Negative for chills and fever.  HENT: Negative for congestion, ear discharge and nosebleeds.   Eyes: Negative for blurred vision.  Respiratory: Negative for cough, hemoptysis, sputum production, shortness of breath and wheezing.   Cardiovascular: Negative for chest pain, palpitations, orthopnea and claudication.  Gastrointestinal:  Negative for abdominal pain, blood in stool, constipation, diarrhea, heartburn, melena, nausea and vomiting.       Dysphagia  Genitourinary: Negative for dysuria, flank pain, frequency, hematuria and urgency.  Musculoskeletal: Negative for back pain, joint pain and myalgias.  Skin: Negative for rash.  Neurological: Negative for dizziness, tingling, focal weakness, seizures, weakness and headaches.  Endo/Heme/Allergies: Does not bruise/bleed easily.  Psychiatric/Behavioral: Negative for depression and suicidal ideas. The patient does not have insomnia.     Allergies  Allergen Reactions  . Flomax [Tamsulosin Hcl] Other (See Comments)    syncope  . Ibuprofen Other (See Comments)    Oral blisters at high doses  . Rosuvastatin     REACTION: muscle stiffness  . Sulfa Antibiotics Rash  . Sulfonamide Derivatives Rash    Past Medical History:  Diagnosis Date  . Anemia   . Arthritis    hands  . Bradycardia   . Colon polyp   . Diabetes mellitus, type II ([place]) 1986   oral meds  . Elevated PSA 2011   per Dr. Jacqlyn Larsen with Uro no problems last 5 or 6 yrs  . Esophageal cancer ([place]) dx sept 18 2018  . HOH (hard of hearing)    bilateral AIDES  . Hyperlipidemia   . Hypertension 02/02   off bp meds since hospitalization 3 weeks ago  . Hypothyroidism 1980's  . Palpitations   . Right rib fracture 2015  . SVT (supraventricular tachycardia) ([place])    a. reported SVT during admission at [facility] 06/2014  . Syncope and collapse    none recent last few weeks  . Wears dentures    upper and lower    Past Surgical History:  Procedure Laterality Date  . BACK SURGERY   1978, 09/2015   COMPRESSED FRACTURE SPINE  . carotid ultrasound  02/02   wnl  . CATARACT EXTRACTION W/PHACO Right 03/19/2016   Procedure: CATARACT EXTRACTION PHACO AND INTRAOCULAR LENS PLACEMENT (IOC);  Surgeon: Anita Prakash Vin-Parikh, MD;  Location: [facility];  Service: Ophthalmology;  Laterality: Right;  DIABETIC-oral  med RIGHT  . CATARACT EXTRACTION W/PHACO Left 04/23/2016   Procedure: CATARACT EXTRACTION PHACO AND INTRAOCULAR LENS PLACEMENT (IOC);  Surgeon: Anita Prakash Vin-Parikh, MD;  Location: [facility];  Service: Ophthalmology;  Laterality: Left;  LEFT DIABETIC - oral meds  . CHOLECYSTECTOMY  1967  . COLONOSCOPY W/ POLYPECTOMY  2012   Villous adenoma from the rectum without high-grade dysplasia, 10 mm  . COLONOSCOPY WITH PROPOFOL N/A 03/30/2015   Procedure: COLONOSCOPY WITH PROPOFOL;  Surgeon: Jeffrey W Byrnett, MD;  Location: [facility];  Service: Endoscopy;  Laterality: N/A;  . CYST EXCISION     thumb  . CYSTECTOMY  06/14/04   mucous cyst excision with left thumb, IP joint debridement  . ESOPHAGOGASTRODUODENOSCOPY (EGD) WITH PROPOFOL N/A 06/11/2017   Procedure: ESOPHAGOGASTRODUODENOSCOPY (EGD) WITH PROPOFOL;  Surgeon: Wohl, Darren, MD;  Location: [facility];  Service: Endoscopy;  Laterality: N/A;  . ESOPHAGOGASTRODUODENOSCOPY (EGD) WITH PROPOFOL N/A 12/31/2017   Procedure: ESOPHAGOGASTRODUODENOSCOPY (EGD) WITH PROPOFOL;  Surgeon: Anna, Kiran, MD;  Location: [facility];  Service: Endoscopy;  Laterality: N/A;  . ESOPHAGOGASTRODUODENOSCOPY (EGD) WITH PROPOFOL N/A 05/08/2018   Procedure: ESOPHAGOGASTRODUODENOSCOPY (EGD) WITH PROPOFOL;  Surgeon: Anna, Kiran, MD;  Location: [facility];  Service: Gastroenterology;  Laterality: N/A;  . ESOPHAGOGASTRODUODENOSCOPY (EGD) WITH PROPOFOL N/A 03/20/2019   Procedure: ESOPHAGOGASTRODUODENOSCOPY (EGD) WITH PROPOFOL;  Surgeon: Anna, Kiran, MD;  Location: [facility];  Service: Gastroenterology;  Laterality: N/A;  . EUS N/A 07/04/2017   Procedure: UPPER ENDOSCOPIC ULTRASOUND (EUS) RADIAL;  Surgeon: Jacobs, Daniel P, MD;  Location: [facility];  Service: Endoscopy;  Laterality: N/A;  . EYE SURGERY    . FLEXIBLE SIGMOIDOSCOPY N/A 06/11/2017   Procedure: FLEXIBLE SIGMOIDOSCOPY;  Surgeon: Wohl, Darren, MD;  Location: [facility];  Service:  Endoscopy;  Laterality: N/A;  . HERNIA REPAIR  2013  . KYPHOPLASTY N/A 10/06/2015   Procedure: Thoracic twelve kyphoplasty;  Surgeon: Randy Kritzer, MD;  Location: [facility];  Service: Neurosurgery;  Laterality: N/A;  T12 Kyphoplasty  . PROSTATE BIOPSY  11/01/08   Dr. Humphries  . VARICOSE VEIN SURGERY  1975  . VASECTOMY  1975    Social History   Socioeconomic History  . Marital status: Widowed    Spouse name: Not on file  . Number of children: Not on file  . Years of education: Not on file  . Highest education level: Not on file  Occupational History  . Occupation: Fixer Instructor Feeder mechanic    Employer: retired    Comment: Retired 1999  Social Needs  . Financial resource strain: Not on file  . Food insecurity    Worry: Not on file    Inability: Not on file  . Transportation needs    Medical: Not on file    Non-medical: Not on file  Tobacco Use  . Smoking status: Never Smoker  . Smokeless tobacco: Never Used  Substance and Sexual Activity  . Alcohol use: No    Alcohol/week: 0.0 standard drinks  . Drug use: Never  . Sexual activity: Never  Lifestyle  . Physical activity    Days per week: Not on file    Minutes per   session: Not on file  . Stress: Not on file  Relationships  . Social connections    Talks on phone: Not on file    Gets together: Not on file    Attends religious service: Not on file    Active member of club or organization: Not on file    Attends meetings of clubs or organizations: Not on file    Relationship status: Not on file  . Intimate partner violence    Fear of current or ex partner: Not on file    Emotionally abused: Not on file    Physically abused: Not on file    Forced sexual activity: Not on file  Other Topics Concern  . Not on file  Social History Narrative   [place]- widower 2000 ([place]), not marred but with partner since 2001.    Retired from [employer].     [employer] '[employer], stationed in [place]    Family History  Problem  Relation Age of Onset  . Cancer Father        liver  . Cancer Sister        breast  . Cancer Brother        skin  . Cancer Sister        breast  . Heart disease Sister        pacer  . Heart disease Sister   . Cancer Sister        breast cancer  . Heart disease Sister   . Colon cancer Neg Hx   . Prostate cancer Neg Hx      Current Outpatient Medications:  .  acetaminophen (TYLENOL) 500 MG tablet, Take 500-1,000 mg by mouth every 6 (six) hours as needed (for pain.)., Disp: , Rfl:  .  diphenhydrAMINE (BENADRYL) 25 mg capsule, Take 25 mg by mouth at bedtime as needed (for sleep.). , Disp: , Rfl:  .  docusate sodium (COLACE) 100 MG capsule, Take 100 mg by mouth 2 (two) times daily as needed (for constipation)., Disp: , Rfl:  .  furosemide (LASIX) 20 MG tablet, Take 1-2 tablets (20-40 mg total) by mouth daily as needed for fluid., Disp: , Rfl:  .  glimepiride (AMARYL) 1 MG tablet, TAKE 2 TABLETS(2 MG) BY MOUTH DAILY WITH BREAKFAST, Disp: 180 tablet, Rfl: 3 .  levothyroxine (SYNTHROID) 75 MCG tablet, TAKE 1 TABLET(75 MCG) BY MOUTH DAILY, Disp: 90 tablet, Rfl: 1 .  metFORMIN (GLUCOPHAGE) 500 MG tablet, TAKE 1 TABLET BY MOUTH EVERY MORNING AND 2 TABLETS BY MOUTH AT SUPPER AND 1 TABLET AT NIGHT IF NEEDED, Disp: 360 tablet, Rfl: 0 .  Multiple Vitamin (MULTIVITAMIN WITH MINERALS) TABS tablet, Take 1 tablet by mouth daily. Centrum Silver, Disp: , Rfl:  .  ondansetron (ZOFRAN) 4 MG tablet, Take 4 mg by mouth every 8 (eight) hours as needed for nausea or vomiting., Disp: , Rfl:  .  pantoprazole (PROTONIX) 40 MG tablet, Take 1 tablet (40 mg total) by mouth 2 (two) times daily., Disp: 180 tablet, Rfl: 3 .  pravastatin (PRAVACHOL) 80 MG tablet, TAKE 1 TABLET(80 MG) BY MOUTH AT BEDTIME, Disp: 90 tablet, Rfl: 3 .  pyridOXINE (VITAMIN B-6) 100 MG tablet, Take 100 mg by mouth 3 (three) times a week. , Disp: , Rfl:  .  Ascorbic Acid (VITAMIN C) 1000 MG tablet, Take 1,000 mg by mouth daily., Disp: , Rfl:   .  benzonatate (TESSALON) 100 MG capsule, Take 100 mg by mouth at bedtime as needed for cough., Disp: ,   Rfl:  .  Blood Glucose Monitoring Suppl (ONE TOUCH ULTRA 2) w/Device KIT, Use once to twice daily as needed Dx E11.9 (Patient not taking: Reported on 03/20/2019), Disp: 1 each, Rfl: 0 .  glucose blood (ONE TOUCH ULTRA TEST) test strip, TO TEST BLOOD SUGAR ONCE OR TWICE DAILY AND AS DIRECTED DX E11.9.  H/o variable blood sugars., Disp: 200 each, Rfl: 1 .  ondansetron (ZOFRAN ODT) 4 MG disintegrating tablet, Take 1 tablet (4 mg total) by mouth every 8 (eight) hours as needed for nausea or vomiting., Disp: 90 tablet, Rfl: 3  Ct Chest W Contrast  Result Date: 03/02/2019 CLINICAL DATA:  Four-day history of difficulty swallowing, nausea and vomiting. History of esophageal cancer status post chemotherapy and radiation therapy. EXAM: CT CHEST, ABDOMEN, AND PELVIS WITH CONTRAST TECHNIQUE: Multidetector CT imaging of the chest, abdomen and pelvis was performed following the standard protocol during bolus administration of intravenous contrast. CONTRAST:  75mL OMNIPAQUE IOHEXOL 300 MG/ML  SOLN COMPARISON:  Multiple prior PET CTs from 2019 and 2018 FINDINGS: CT CHEST FINDINGS Cardiovascular: The heart is normal in size for age. No pericardial effusion. There is moderate tortuosity, ectasia and calcification of the thoracic aorta but no focal aneurysm or dissection. The branch vessels are patent. Scattered coronary artery calcifications are noted. Mediastinum/Nodes: No mediastinal or hilar mass or lymphadenopathy. The esophagus is unremarkable. No esophageal mass is identified. There is mild fullness in the region of the distal esophagus and GE junction with some interstitial changes in the surrounding fat. This is likely radiation change. No discrete recurrent mass is identified. However, it does appear slightly more prominent than the prior PET-CT and endoscopic evaluation may be helpful. Lungs/Pleura: No suspicious  lung lesions to suggest metastatic disease. No infiltrates, edema or effusions. Patchy bibasilar atelectasis is noted along with basilar scarring changes. Musculoskeletal: No significant bony findings. Stable degenerative changes involving the spine and stable osteoporosis. Remote T12 vertebral augmentation changes. CT ABDOMEN PELVIS FINDINGS Hepatobiliary: No worrisome hepatic lesions to suggest metastatic disease. The gallbladder is surgically absent. No common bile duct dilatation. Pancreas: Moderate pancreatic atrophy but no mass or acute inflammation. Spleen: Normal size.  No focal lesions. Adrenals/Urinary Tract: The adrenal glands and kidneys are unremarkable and stable. No renal lesions or hydronephrosis. The bladder appears normal. Stomach/Bowel: The stomach is not well distended with contrast but no gastric mass is identified. The duodenum, small bowel and colon are unremarkable. No acute inflammatory changes, mass lesions or obstructive findings. Advanced sigmoid colon diverticulosis is again noted but no findings for acute diverticulitis. Vascular/Lymphatic: Moderate tortuosity and calcification of the thoracic aorta but no aneurysm or dissection. The branch vessels are patent. The major venous structures are patent. No gastrohepatic ligament, celiac axis or retroperitoneal adenopathy. Reproductive: Enlarged prostate gland. The seminal vesicles appear normal. Other: No pelvic mass or adenopathy. No free pelvic fluid collections. No inguinal mass or adenopathy. No abdominal wall hernia or subcutaneous lesions. Musculoskeletal: No significant bony findings. Stable degenerative changes involving the spine. IMPRESSION: 1. Soft tissue fullness in the distal esophagus/GE junction region. This appears slightly more prominent than the prior PET-CT from 05/20/2018. I do not see a discrete mass and this could be progressive radiation change but endoscopic evaluation may be helpful for further evaluation and to  exclude recurrent infiltrating tumor. 2. No adenopathy or other findings for metastatic disease involving the chest, abdomen or pelvis. 3. Stable age related atherosclerotic calcifications involving the thoracic and abdominal aorta and coronary arteries. Electronically Signed     By: P.  Gallerani M.D.   On: 03/02/2019 12:53   Ct Abdomen Pelvis W Contrast  Result Date: 03/02/2019 CLINICAL DATA:  Four-day history of difficulty swallowing, nausea and vomiting. History of esophageal cancer status post chemotherapy and radiation therapy. EXAM: CT CHEST, ABDOMEN, AND PELVIS WITH CONTRAST TECHNIQUE: Multidetector CT imaging of the chest, abdomen and pelvis was performed following the standard protocol during bolus administration of intravenous contrast. CONTRAST:  75mL OMNIPAQUE IOHEXOL 300 MG/ML  SOLN COMPARISON:  Multiple prior PET CTs from 2019 and 2018 FINDINGS: CT CHEST FINDINGS Cardiovascular: The heart is normal in size for age. No pericardial effusion. There is moderate tortuosity, ectasia and calcification of the thoracic aorta but no focal aneurysm or dissection. The branch vessels are patent. Scattered coronary artery calcifications are noted. Mediastinum/Nodes: No mediastinal or hilar mass or lymphadenopathy. The esophagus is unremarkable. No esophageal mass is identified. There is mild fullness in the region of the distal esophagus and GE junction with some interstitial changes in the surrounding fat. This is likely radiation change. No discrete recurrent mass is identified. However, it does appear slightly more prominent than the prior PET-CT and endoscopic evaluation may be helpful. Lungs/Pleura: No suspicious lung lesions to suggest metastatic disease. No infiltrates, edema or effusions. Patchy bibasilar atelectasis is noted along with basilar scarring changes. Musculoskeletal: No significant bony findings. Stable degenerative changes involving the spine and stable osteoporosis. Remote T12 vertebral  augmentation changes. CT ABDOMEN PELVIS FINDINGS Hepatobiliary: No worrisome hepatic lesions to suggest metastatic disease. The gallbladder is surgically absent. No common bile duct dilatation. Pancreas: Moderate pancreatic atrophy but no mass or acute inflammation. Spleen: Normal size.  No focal lesions. Adrenals/Urinary Tract: The adrenal glands and kidneys are unremarkable and stable. No renal lesions or hydronephrosis. The bladder appears normal. Stomach/Bowel: The stomach is not well distended with contrast but no gastric mass is identified. The duodenum, small bowel and colon are unremarkable. No acute inflammatory changes, mass lesions or obstructive findings. Advanced sigmoid colon diverticulosis is again noted but no findings for acute diverticulitis. Vascular/Lymphatic: Moderate tortuosity and calcification of the thoracic aorta but no aneurysm or dissection. The branch vessels are patent. The major venous structures are patent. No gastrohepatic ligament, celiac axis or retroperitoneal adenopathy. Reproductive: Enlarged prostate gland. The seminal vesicles appear normal. Other: No pelvic mass or adenopathy. No free pelvic fluid collections. No inguinal mass or adenopathy. No abdominal wall hernia or subcutaneous lesions. Musculoskeletal: No significant bony findings. Stable degenerative changes involving the spine. IMPRESSION: 1. Soft tissue fullness in the distal esophagus/GE junction region. This appears slightly more prominent than the prior PET-CT from 05/20/2018. I do not see a discrete mass and this could be progressive radiation change but endoscopic evaluation may be helpful for further evaluation and to exclude recurrent infiltrating tumor. 2. No adenopathy or other findings for metastatic disease involving the chest, abdomen or pelvis. 3. Stable age related atherosclerotic calcifications involving the thoracic and abdominal aorta and coronary arteries. Electronically Signed   By: P.  Gallerani  M.D.   On: 03/02/2019 12:53    No images are attached to the encounter.   CMP Latest Ref Rng & Units 02/25/2019  Glucose 70 - 99 mg/dL 192(H)  BUN 8 - 23 mg/dL 16  Creatinine 0.61 - 1.24 mg/dL 1.41(H)  Sodium 135 - 145 mmol/L 139  Potassium 3.5 - 5.1 mmol/L 3.4(L)  Chloride 98 - 111 mmol/L 102  CO2 22 - 32 mmol/L 27  Calcium 8.9 - 10.3 mg/dL 8.6(L)  Total   Protein 6.5 - 8.1 g/dL 6.6  Total Bilirubin 0.3 - 1.2 mg/dL 0.9  Alkaline Phos 38 - 126 U/L 62  AST 15 - 41 U/L 24  ALT 0 - 44 U/L 21   CBC Latest Ref Rng & Units 02/25/2019  WBC 4.0 - 10.5 K/uL 7.2  Hemoglobin 13.0 - 17.0 g/dL 10.5(L)  Hematocrit 39.0 - 52.0 % 32.5(L)  Platelets 150 - 400 K/uL 185     Observation/objective:appears frail and fatigued  Assessment and plan: Patient is a 85 year old male with a history of stage II esophageal cancer and signet cell carcinoma of the stomach with disease progression at GE junction causing luminal narrowing. This is a visit to discuss palliative options  I discussed following options with patient and family:  1. Palliative RT with hopes to open up the luminal narrowing and for him to be able to swallow better. He will need PET scan prior to RT per Rad onc.   2. Palliative chemotherapy but patient is quite frail. cemo associated with increased side effects and may not help luminal narrowing  3. Feeding tube down the line if RT does not help before it completely obstrcuts  4. I would not recommend TPN for palliation.  Patient's family would like to proceed with option 1. They do not want chemo or feeding tube at this time. Post RT we could potentially consider egd in 6-8 weeks post RT   Follow-up instructions: I will see him back in 2 months  I discussed the assessment and treatment plan with the patient. The patient was provided an opportunity to ask questions and all were answered. The patient agreed with the plan and demonstrated an understanding of the instructions.   The  patient was advised to call back or seek an in-person evaluation if the symptoms worsen or if the condition fails to improve as anticipated.   Visit Diagnosis: 1. Esophageal cancer, stage IIB ([place])   2. Goals of care, counseling/discussion     Dr. Randa Evens, MD, MPH [facility] at [facility] [phone] 03/29/2019 7:34 PM

## 2019-03-31 ENCOUNTER — Other Ambulatory Visit: Payer: Self-pay | Admitting: Oncology

## 2019-03-31 MED ORDER — PROCHLORPERAZINE MALEATE 10 MG PO TABS: 10.0000 mg | ORAL_TABLET | Freq: Four times a day (QID) | ORAL | 0 refills | Status: DC | PRN

## 2019-04-02 ENCOUNTER — Ambulatory Visit: Payer: Medicare Other | Admitting: Oncology

## 2019-04-02 ENCOUNTER — Ambulatory Visit: Payer: Medicare Other | Admitting: Radiation Oncology

## 2019-04-03 ENCOUNTER — Other Ambulatory Visit: Payer: Self-pay

## 2019-04-03 ENCOUNTER — Encounter
Admission: RE | Admit: 2019-04-03 | Discharge: 2019-04-03 | Disposition: A | Discharge status: Home / Self Care | Payer: Medicare Other | Source: Ambulatory Visit | Attending: Oncology | Admitting: Oncology

## 2019-04-03 DIAGNOSIS — C159 Malignant neoplasm of esophagus, unspecified: Secondary | ICD-10-CM | POA: Insufficient documentation

## 2019-04-03 DIAGNOSIS — I2510 Atherosclerotic heart disease of native coronary artery without angina pectoris: Secondary | ICD-10-CM | POA: Diagnosis not present

## 2019-04-03 LAB — GLUCOSE, CAPILLARY: Glucose-Capillary: 98 mg/dL (ref 70–99)

## 2019-04-03 MED ORDER — FLUDEOXYGLUCOSE F - 18 (FDG) INJECTION
7.1200 | Freq: Once | INTRAVENOUS | Status: AC | PRN
  Administered 2019-04-03: 7.12 via INTRAVENOUS

## 2019-04-07 ENCOUNTER — Other Ambulatory Visit: Payer: Self-pay

## 2019-04-07 ENCOUNTER — Ambulatory Visit
Admission: RE | Admit: 2019-04-07 | Discharge: 2019-04-07 | Disposition: A | Discharge status: Home / Self Care | Payer: Medicare Other | Source: Ambulatory Visit | Attending: Radiation Oncology | Admitting: Radiation Oncology

## 2019-04-07 ENCOUNTER — Encounter: Payer: Self-pay | Admitting: Radiation Oncology

## 2019-04-07 DIAGNOSIS — C155 Malignant neoplasm of lower third of esophagus: Secondary | ICD-10-CM | POA: Diagnosis not present

## 2019-04-07 DIAGNOSIS — C159 Malignant neoplasm of esophagus, unspecified: Secondary | ICD-10-CM

## 2019-04-07 NOTE — Progress Notes (Signed)
Radiation Oncology Follow up Note salvage radiation therapy to distal esophagus  Name: Aaron Becker   Date:   04/07/2019 MRN:  [id number] DOB: [birth year]  Radiation Oncology TeleHEALTH VISIT PROGRESS NOTE  I connected with  Michaiah Havlin  by telephone-Webex and verified that I am speaking with the correct person using two identifiers.  I discussed the limitations, risks, security and privacy concerns of performing an evaluation and management service by telemedicine and the availability of in-person appointments. I also discussed with the patient that there may be a patient responsible charge related to this service. The patient expressed understanding and agreed to proceed.    Other persons participating in the visit and their role in the encounter: Wife and daughter both participated in phone call   Patient's location: Home   Provider's location: work  This 85 y.o. male presents to the clinic today for consideration of salvage radiation therapy and patient previously irradiated for distal esophageal adenocarcinoma.  REFERRING PROVIDER: Tonia Ghent, MD  HPI: Patient is an 85 year old male previously treated back in 2018 for distal esophageal stage IIb adenocarcinoma.  He received concurrent carbotaxol and radiation therapy up to 5000 cGy.  He has been having progressive difficulties with dysphasia.  He is also known to had signet cell carcinoma in the gastric cardia on repeat endoscopy in 2019 and they have been doing watchful waiting.  His CT scans in June 2020 showed GE junction with thickening confirmed on barium swallow.  He underwent upper endoscopy showing luminal narrowing with persistent tumor.  This area also was hypermetabolic on recent PET CT scan.  Patient is somewhat frail and weak and I been asked to consider pot possible salvage radiation therapy to this region..  COMPLICATIONS OF TREATMENT: none  FOLLOW UP COMPLIANCE: keeps appointments   PHYSICAL EXAM:  There were no  vitals taken for this visit. Physical exam was not performed since this was a telemedicine conference call  RADIOLOGY RESULTS: PET/CT CT scans all reviewed  PLAN: At this time I believe I can offer a short hypofractionated course of radiation therapy treating up to 2700 cGy in 15 fractions using IMRT treatment planning and delivery.  I would choose IMRT treatment planning delivery to spare previously radiated structures as much as possible and have tight conformal fields focused on the area of hypermetabolic activity by PET/CT criteria.  Risks and benefits of treatment including worsening of dysphasia temporarily possible chance of perforation secondary to tumor involvement fatigue skin reaction all were discussed in detail with the patient and his family.  Family is interested in pursuing treatment and I have personally set up and ordered CT simulation for early next week.  I would like to take this opportunity to thank you for allowing me to participate in the care of your patient.Noreene Filbert, MD

## 2019-04-09 ENCOUNTER — Ambulatory Visit (HOSPITAL_BASED_OUTPATIENT_CLINIC_OR_DEPARTMENT_OTHER): Payer: Medicare Other | Admitting: Oncology

## 2019-04-09 DIAGNOSIS — R531 Weakness: Secondary | ICD-10-CM | POA: Diagnosis not present

## 2019-04-09 DIAGNOSIS — C159 Malignant neoplasm of esophagus, unspecified: Secondary | ICD-10-CM | POA: Diagnosis not present

## 2019-04-09 MED ORDER — GLYCOPYRROLATE 1 MG/5ML PO SOLN: 1.0000 mg | Freq: Every morning | ORAL | 0 refills | Status: DC

## 2019-04-09 NOTE — Progress Notes (Signed)
Virtual Visit via Telephone Note  I connected with Cayetano E Macrae on 04/09/19 at  2:00 PM EDT by telephone and verified that I am speaking with the correct person using two identifiers.  Location: Patient: home Provider: home   I discussed the limitations, risks, security and privacy concerns of performing an evaluation and management service by telephone and the availability of in person appointments. I also discussed with the patient that there may be a patient responsible charge related to this service. The patient expressed understanding and agreed to proceed.  Chief Complaint: Secretions  History of Present Illness: Patient is an 85 year old male with history of stage IIb esophageal cancer diagnosed in September 2018.  Today, he received concurrent carbo/Taxol with radiation for 5-week-completed in December 2018.  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  He was evaluated by Dr. your Janetta Hora at [facility] and plan was for watchful waiting versus 5-FU single agent chemotherapy.  Patient chose watchful waiting.  Patient presents back to our clinic in late May/early June for complaints of weight loss and dysphasia.  Subsequently had CT scans that showed no distant metastasis but increased GE junction thickening which was confirmed by barium swallow.  Had EGD by Dr. Vicente Males which showed luminal narrowing due to persistent tumor.  Plan is for radiation in hopes to open the luminal narrowing.  This is plan to start on Monday, 04/13/2019.   Today, patient significant other has called clinic concerned about patient's inability to swallow and or keep foods down without "choking" on thick clear secretions.  This happens periodically throughout the day with or without the intake of food.  The coughing up of secretions unfortunately wears him out to the point where he does not feel like eating or drinking anything additional. Manus Gunning states this has become significantly worse  over the past week.  He is very weak and continues to lose weight.  She has been giving him Zofran ODT which does not appear to be helping.  She has tried warm bone broths and popsicles.  She does not feel that he is aspirating liquids.  He denies chest pain, shortness of breath, constipation or diarrhea.  He admits to coughing periodically throughout the day unrelated to food intake.  Sputum is described as thick and white.  There is no color or odor.   Observations/Objective:limited d/t telephone visit    Assessment and Plan:  Esophageal cancer: Recently noted to have disease progression as evidenced by imaging and recent EGD showing disease progression at GE junction causing luminal narrowing.  Plan is for radiation that is anticipated to begin on 04/13/2019.  Family has declined PEG tube placement at this time as well as systemic chemotherapy. Secrertion: D/t known cancer cuasing luminal narrowing of this esophagus.   Secretions: Spoke with Dr. Janese Banks and this is likely due to the luminal narrowing.  Radiation is scheduled to begin on Monday, 04/13/2019 which should help with his dysphasia.  Consulted with Billey Chang, NP palliative care who recommends glycopyrrolate 1 mg/5 mL solution once daily for secretions.  If he tolerates, we can increase dose to twice daily.  Plan: RX glycopyrrolate 1 mg / 5 mL solution once daily.  Anticipate increased to twice daily if patient tolerates. Continue high-protein foods and liquids.   Unfortunately, glycopyrrolate was not covered by his insurance and would be out of pocket cost of greater than $2000.  Spoke with pharmacist and Billey Chang, NP and could try atropine ophthalmic drops  twice daily.   Disposition: RTC as scheduled on 04/13/2019 for CT simulation to begin radiation. Return to clinic in October 2020 for MD assessment by Dr. Janese Banks and further treatment planning. Start atropine ophthalmic drops twice daily to help with secretions.  Follow Up  Instructions: See him back if symptoms worsen.    I discussed the assessment and treatment plan with the patient. The patient was provided an opportunity to ask questions and all were answered. The patient agreed with the plan and demonstrated an understanding of the instructions.   The patient was advised to call back or seek an in-person evaluation if the symptoms worsen or if the condition fails to improve as anticipated.  I provided 25 minutes of non-face-to-face time during this encounter.   Jacquelin Hawking, NP

## 2019-04-10 ENCOUNTER — Other Ambulatory Visit: Payer: Self-pay

## 2019-04-10 ENCOUNTER — Encounter: Payer: Self-pay | Admitting: *Deleted

## 2019-04-10 ENCOUNTER — Telehealth: Payer: Self-pay | Admitting: *Deleted

## 2019-04-10 MED ORDER — ATROPINE SULFATE 1 % OP SOLN: 2.0000 [drp] | Freq: Three times a day (TID) | OPHTHALMIC | 12 refills | Status: DC

## 2019-04-10 NOTE — Telephone Encounter (Signed)
Finnean Leske Key: [id number]  PA - submitted for cuposa. Pending insurance approval.

## 2019-04-12 ENCOUNTER — Emergency Department
Admission: EM | Admit: 2019-04-12 | Discharge: 2019-04-12 | Disposition: A | Discharge status: Home / Self Care | Payer: Medicare Other | Attending: Emergency Medicine | Admitting: Emergency Medicine

## 2019-04-12 ENCOUNTER — Other Ambulatory Visit: Payer: Self-pay

## 2019-04-12 DIAGNOSIS — R410 Disorientation, unspecified: Secondary | ICD-10-CM | POA: Insufficient documentation

## 2019-04-12 DIAGNOSIS — R1310 Dysphagia, unspecified: Secondary | ICD-10-CM | POA: Diagnosis not present

## 2019-04-12 DIAGNOSIS — Z7984 Long term (current) use of oral hypoglycemic drugs: Secondary | ICD-10-CM | POA: Insufficient documentation

## 2019-04-12 DIAGNOSIS — E039 Hypothyroidism, unspecified: Secondary | ICD-10-CM | POA: Diagnosis not present

## 2019-04-12 DIAGNOSIS — I10 Essential (primary) hypertension: Secondary | ICD-10-CM | POA: Diagnosis not present

## 2019-04-12 DIAGNOSIS — R600 Localized edema: Secondary | ICD-10-CM | POA: Insufficient documentation

## 2019-04-12 DIAGNOSIS — E119 Type 2 diabetes mellitus without complications: Secondary | ICD-10-CM | POA: Insufficient documentation

## 2019-04-12 DIAGNOSIS — R630 Anorexia: Secondary | ICD-10-CM | POA: Insufficient documentation

## 2019-04-12 DIAGNOSIS — R531 Weakness: Secondary | ICD-10-CM | POA: Diagnosis not present

## 2019-04-12 DIAGNOSIS — Z8501 Personal history of malignant neoplasm of esophagus: Secondary | ICD-10-CM | POA: Insufficient documentation

## 2019-04-12 DIAGNOSIS — Z79899 Other long term (current) drug therapy: Secondary | ICD-10-CM | POA: Insufficient documentation

## 2019-04-12 DIAGNOSIS — E860 Dehydration: Secondary | ICD-10-CM | POA: Diagnosis not present

## 2019-04-12 DIAGNOSIS — R42 Dizziness and giddiness: Secondary | ICD-10-CM | POA: Diagnosis not present

## 2019-04-12 DIAGNOSIS — R1319 Other dysphagia: Secondary | ICD-10-CM

## 2019-04-12 LAB — URINALYSIS, COMPLETE (UACMP) WITH MICROSCOPIC
Bacteria, UA: NONE SEEN
Bilirubin Urine: NEGATIVE
Glucose, UA: NEGATIVE mg/dL
Hgb urine dipstick: NEGATIVE
Ketones, ur: 5 mg/dL — AB
Leukocytes,Ua: NEGATIVE
Nitrite: NEGATIVE
Protein, ur: 100 mg/dL — AB
Specific Gravity, Urine: 1.017 (ref 1.005–1.030)
Squamous Epithelial / HPF: NONE SEEN (ref 0–5)
pH: 5 (ref 5.0–8.0)

## 2019-04-12 LAB — HEPATIC FUNCTION PANEL
ALT: 19 U/L (ref 0–44)
AST: 26 U/L (ref 15–41)
Albumin: 3.4 g/dL — ABNORMAL LOW (ref 3.5–5.0)
Alkaline Phosphatase: 58 U/L (ref 38–126)
Bilirubin, Direct: 0.2 mg/dL (ref 0.0–0.2)
Indirect Bilirubin: 0.4 mg/dL (ref 0.3–0.9)
Total Bilirubin: 0.6 mg/dL (ref 0.3–1.2)
Total Protein: 6.2 g/dL — ABNORMAL LOW (ref 6.5–8.1)

## 2019-04-12 LAB — BASIC METABOLIC PANEL
Anion gap: 10 (ref 5–15)
BUN: 16 mg/dL (ref 8–23)
CO2: 24 mmol/L (ref 22–32)
Calcium: 8.6 mg/dL — ABNORMAL LOW (ref 8.9–10.3)
Chloride: 104 mmol/L (ref 98–111)
Creatinine, Ser: 1.25 mg/dL — ABNORMAL HIGH (ref 0.61–1.24)
GFR calc Af Amer: >60 mL/min (ref >60)
GFR calc non Af Amer: 52 mL/min — ABNORMAL LOW (ref >60)
Glucose, Bld: 143 mg/dL — ABNORMAL HIGH (ref 70–99)
Potassium: 3.1 mmol/L — ABNORMAL LOW (ref 3.5–5.1)
Sodium: 138 mmol/L (ref 135–145)

## 2019-04-12 LAB — CBC WITH DIFFERENTIAL/PLATELET
Abs Immature Granulocytes: 0.04 10*3/uL (ref 0.00–0.07)
Basophils Absolute: 0 10*3/uL (ref 0.0–0.1)
Basophils Relative: 0 %
Eosinophils Absolute: 0 10*3/uL (ref 0.0–0.5)
Eosinophils Relative: 0 %
HCT: 34.4 % — ABNORMAL LOW (ref 39.0–52.0)
Hemoglobin: 11.3 g/dL — ABNORMAL LOW (ref 13.0–17.0)
Immature Granulocytes: 1 %
Lymphocytes Relative: 4 %
Lymphs Abs: 0.3 10*3/uL — ABNORMAL LOW (ref 0.7–4.0)
MCH: 28.3 pg (ref 26.0–34.0)
MCHC: 32.8 g/dL (ref 30.0–36.0)
MCV: 86.2 fL (ref 80.0–100.0)
Monocytes Absolute: 0.4 10*3/uL (ref 0.1–1.0)
Monocytes Relative: 6 %
Neutro Abs: 6.3 10*3/uL (ref 1.7–7.7)
Neutrophils Relative %: 89 %
Platelets: 189 10*3/uL (ref 150–400)
RBC: 3.99 MIL/uL — ABNORMAL LOW (ref 4.22–5.81)
RDW: 16 % — ABNORMAL HIGH (ref 11.5–15.5)
WBC: 7.1 10*3/uL (ref 4.0–10.5)
nRBC: 0 % (ref 0.0–0.2)

## 2019-04-12 LAB — MAGNESIUM: Magnesium: 1.8 mg/dL (ref 1.7–2.4)

## 2019-04-12 LAB — LACTIC ACID, PLASMA: Lactic Acid, Venous: 1.2 mmol/L (ref 0.5–1.9)

## 2019-04-12 MED ORDER — SODIUM CHLORIDE 0.9 % IV BOLUS
500.0000 mL | Freq: Once | INTRAVENOUS | Status: AC
  Administered 2019-04-12: 500 mL via INTRAVENOUS

## 2019-04-12 MED ORDER — POTASSIUM CHLORIDE 20 MEQ/15ML (10%) PO SOLN
40.0000 meq | Freq: Once | ORAL | Status: AC
  Administered 2019-04-12: 40 meq via ORAL
  Filled 2019-04-12: qty 30

## 2019-04-12 NOTE — ED Notes (Signed)
Pt reports "tripping on the door" and "supporting my self on the wall". Pt denies hitting his head/LOC.

## 2019-04-12 NOTE — ED Provider Notes (Signed)
3:11 PM Assumed care from off going team.   Pending labs-esophageal cancer with dysphagia and decreased PO intake. Getting radiation rx. Hard of hearing. If labs are re-assuring then they are okay with d/c.  500 cc given.   Potassium is at 3.1.  Will give 40 of p.o. potassium liquid.  Creatinine is around patient's baseline at 1.25.  Anion gap is normal.  White count was normal making infection less likely.  Hemoglobin was around patient's baseline.  Lactate was normal.  4:19 PM reevaluated patient.  Continues to do well.  Patient says he is able to drink ensures at home.  Patient given 40 of p.o. liquid potassium.  Will check a magnesium awaiting for urine.  Magnesium was normal at 1.8.  Urine with evidence of  A little bit of dehydration with 5 ketones and 100 protein but patient was Ar[place]ready given fluid.  Patient says he is Already feeling better.  Patient has follow-up tomorrow with his primary care doctors.  Instructed patient that he will need follow-up for repeat potassium in the future.  Patient feels comfortable with being discharged home.  I discussed the provisional nature of ED diagnosis, the treatment so far, the ongoing plan of care, follow up appointments and return precautions with the patient and any family or support people present. They expressed understanding and agreed with the plan, discharged home.       Vanessa Glen Flora, MD 04/12/19 1655

## 2019-04-12 NOTE — ED Notes (Signed)
ED Provider Siadecki at bedside. 

## 2019-04-12 NOTE — ED Notes (Signed)
ED Provider Funke at bedside.

## 2019-04-12 NOTE — ED Notes (Signed)
Pt able to stand on side of the bed and use urinal with 1 person assistance.

## 2019-04-12 NOTE — ED Provider Notes (Signed)
[facility] Emergency Department Provider Note ____________________________________________   First MD Initiated Contact with Patient 04/12/19 1411     (approximate)  I have reviewed the triage vital signs and the nursing notes.   HISTORY  Chief Complaint Dehydration    HPI Jacorian E Panning is a 85 y.o. male with PMH as noted below including esophageal cancer currently under treatment who presents with decreased p.o. intake, gradual onset and worsening over the last week.  He states that he was able to get a few sips of soda down but otherwise has not been tolerating solids or most liquids and states that they come back up.  He reports feeling somewhat weak.  The daughter reported that the patient was confused during the night.  The patient denies pain.  Past Medical History:  Diagnosis Date  . Anemia   . Arthritis    hands  . Bradycardia   . Colon polyp   . Diabetes mellitus, type II ([place]) 1986   oral meds  . Dysphasia   . Elevated PSA 2011   per Dr. Jacqlyn Larsen with Uro no problems last 5 or 6 yrs  . Esophageal cancer ([place]) dx sept 18 2018  . Excessive oral secretions   . HOH (hard of hearing)    bilateral AIDES  . Hyperlipidemia   . Hypertension 02/02   off bp meds since hospitalization 3 weeks ago  . Hypothyroidism 1980's  . Palpitations   . Right rib fracture 2015  . SVT (supraventricular tachycardia) ([place])    a. reported SVT during admission at [facility] 06/2014  . Syncope and collapse    none recent last few weeks  . Wears dentures    upper and lower    Patient Active Problem List   Diagnosis Date Noted  . Shoulder pain 01/22/2019  . SOB (shortness of breath) 10/29/2018  . Signet-ring cell carcinoma of stomach ([place]) 05/23/2018  . DNR (do not resuscitate) 01/15/2018  . Abnormal PET scan of liver 12/12/2017  . Weight loss 10/12/2017  . Hypomagnesemia 08/12/2017  . Encounter for antineoplastic chemotherapy 07/29/2017  . Goals of care,  counseling/discussion 07/19/2017  . Esophageal cancer, stage IIB ([place]) 07/11/2017  . Esophageal cancer ([place])   . Rectal polyp   . Melena   . UGIB (upper gastrointestinal bleed)   . Hematochezia   . GI bleed 06/09/2017  . Advance care planning 02/11/2017  . Skin tear of elbow without complication 25/63/8937  . T12 vertebral fracture ([place]) 08/22/2015  . Lower back pain 08/17/2015  . Villous adenoma polyp of rectum 04/06/2015  . History of colonic polyps 02/04/2015  . Bradycardia   . Palpitations   . SVT (supraventricular tachycardia) ([place]) 07/18/2014  . Syncope 07/14/2014  . Benign prostatic hyperplasia with urinary obstruction 07/07/2014  . Impotence 07/07/2014  . Incomplete emptying of bladder 07/07/2014  . Medicare annual wellness visit, subsequent 12/04/2012  . Sleep concern 06/08/2012  . Memory change 12/06/2011  . PSA elevation 11/21/2011  . Right inguinal hernia 06/06/2011  . Diverticulitis of colon 02/22/2011  . SHOULDER STRAIN, RIGHT 11/01/2010  . ARTHRITIS, HANDS, BILATERAL 09/01/2007  . Hypothyroidism 01/07/2007  . Diabetes mellitus without complication ([place]) 34/28/7681  . HYPERCHOLESTEROLEMIA/ 156/LDL 101  7/03 01/07/2007  . Essential hypertension 01/07/2007  . ARTHRITIS, ELBOWS AND HIPS 01/07/2007    Past Surgical History:  Procedure Laterality Date  . [place], 09/2015   COMPRESSED FRACTURE SPINE  . carotid ultrasound  02/02   wnl  .  CATARACT EXTRACTION W/PHACO Right 03/19/2016   Procedure: CATARACT EXTRACTION PHACO AND INTRAOCULAR LENS PLACEMENT (IOC);  Surgeon: Ronnell Freshwater, MD;  Location: [place];  Service: Ophthalmology;  Laterality: Right;  DIABETIC-oral med RIGHT  . CATARACT EXTRACTION W/PHACO Left 04/23/2016   Procedure: CATARACT EXTRACTION PHACO AND INTRAOCULAR LENS PLACEMENT (IOC);  Surgeon: Ronnell Freshwater, MD;  Location: [place];  Service: Ophthalmology;  Laterality: Left;  LEFT DIABETIC - oral  meds  . CHOLECYSTECTOMY  1967  . COLONOSCOPY W/ POLYPECTOMY  2012   Villous adenoma from the rectum without high-grade dysplasia, 10 mm  . COLONOSCOPY WITH PROPOFOL N/A 03/30/2015   Procedure: COLONOSCOPY WITH PROPOFOL;  Surgeon: Robert Bellow, MD;  Location: [facility] ENDOSCOPY;  Service: Endoscopy;  Laterality: N/A;  . CYST EXCISION     thumb  . CYSTECTOMY  06/14/04   mucous cyst excision with left thumb, IP joint debridement  . ESOPHAGOGASTRODUODENOSCOPY (EGD) WITH PROPOFOL N/A 06/11/2017   Procedure: ESOPHAGOGASTRODUODENOSCOPY (EGD) WITH PROPOFOL;  Surgeon: Lucilla Lame, MD;  Location[facility]COPY;  Service: Endoscopy;  Laterality: N/A;  . ESOPHAGOGASTRODUODENOSCOPY (EGD) WITH PROPOFOL N/A 12/31/2017   Procedure: ESOPHAGOGASTRODUODENOSCOPY (EGD) WITH PROPOFOL;  Surgeon: [name], MD;  Location: [facility] ENDOSCOPY;  Service: Endoscopy;  Laterality: N/A;  . ESOPHAGOGASTRODUODENOSCOPY (EGD) WITH PROPOFOL N/A 05/08/2018   Procedure: ESOPHAGOGASTRODUODENOSCOPY (EGD) WITH PROPOFOL;  Surgeon: [name], MD;  Location: [facility] ENDOSCOPY;  Service: Gastroenterology;  Laterality: N/A;  . ESOPHAGOGASTRODUODENOSCOPY (EGD) WITH PROPOFOL N/A 03/20/2019   Procedure: ESOPHAGOGASTRODUODENOSCOPY (EGD) WITH PROPOFOL;  Surgeon: [name], MD;  Location: [facility] ENDOSCOPY;  Service: Gastroenterology;  Laterality: N/A;  . EUS N/A 07/04/2017   Procedure: UPPER ENDOSCOPIC ULTRASOUND (EUS) RADIAL;  Surgeon: [name], MD;  Location: [facility] ENDOSCOPY;  Service: Endoscopy;  Laterality: N/A;  . EYE SURGERY    . FLEXIBLE SIGMOIDOSCOPY N/A 06/11/2017   Procedure: FLEXIBLE SIGMOIDOSCOPY;  Surgeon: Lucilla Lame, MD;  Location[facility] ENDOSCOPY;  Service: Endoscopy;  Laterality: N/A;  . HERNIA REPAIR  2013  . KYPHOPLASTY N/A 10/06/2015   Procedure: Thoracic twelve kyphoplasty;  Surgeon: Karie Chimera, MD;  Location: [place] NEURO ORS;  Service: Neurosurgery;  Laterality: N/A;  T12 Kyphoplasty  . PROSTATE BIOPSY  11/01/08   Dr.  Reece Agar  . [place]  . VASECTOMY  1975    Prior to Admission medications   Medication Sig Start Date End Date Taking? Authorizing Provider  acetaminophen (TYLENOL) 500 MG tablet Take 500-1,000 mg by mouth every 6 (six) hours as needed (for pain.).    [provider]  Ascorbic Acid (VITAMIN C) 1000 MG tablet Take 1,000 mg by mouth daily.    [provider]  atropine 1 % ophthalmic solution Place 2 drops under the tongue 3 (three) times daily. 04/10/19   Jacquelin Hawking, NP  benzonatate (TESSALON) 100 MG capsule Take 100 mg by mouth at bedtime as needed for cough.    [provider]  Blood Glucose Monitoring Suppl (ONE TOUCH ULTRA 2) w/Device KIT Use once to twice daily as needed Dx E11.9 Patient not taking: Reported on 03/20/2019 02/04/19   Tonia Ghent, MD  diphenhydrAMINE (BENADRYL) 25 mg capsule Take 25 mg by mouth at bedtime as needed (for sleep.).     [provider]  docusate sodium (COLACE) 100 MG capsule Take 100 mg by mouth 2 (two) times daily as needed (for constipation).    [provider]  furosemide (LASIX) 20 MG tablet Take 1-2 tablets (20-40 mg  total) by mouth daily as needed for fluid. 12/29/18   Tonia Ghent, MD  glimepiride (AMARYL) 1 MG tablet TAKE 2 TABLETS(2 MG) BY MOUTH DAILY WITH BREAKFAST 03/12/19   Tonia Ghent, MD  glucose blood (ONE TOUCH ULTRA TEST) test strip TO TEST BLOOD SUGAR ONCE OR TWICE DAILY AND AS DIRECTED DX E11.9.  H/o variable blood sugars. 07/02/18   Tonia Ghent, MD  Glycopyrrolate 1 MG/5ML SOLN Take 5 mLs (1 mg total) by mouth every morning. 04/09/19   Jacquelin Hawking, NP  levothyroxine (SYNTHROID) 75 MCG tablet TAKE 1 TABLET(75 MCG) BY MOUTH DAILY 03/23/19   Tonia Ghent, MD  metFORMIN (GLUCOPHAGE) 500 MG tablet TAKE 1 TABLET BY MOUTH EVERY MORNING AND 2 TABLETS BY MOUTH AT SUPPER AND 1 TABLET AT NIGHT IF NEEDED 01/12/19   Tonia Ghent, MD  Multiple Vitamin (MULTIVITAMIN  WITH MINERALS) TABS tablet Take 1 tablet by mouth daily. Centrum Silver    [provider]  ondansetron (ZOFRAN ODT) 4 MG disintegrating tablet Take 1 tablet (4 mg total) by mouth every 8 (eight) hours as needed for nausea or vomiting. 03/26/19   Sindy Guadeloupe, MD  ondansetron (ZOFRAN) 4 MG tablet Take 4 mg by mouth every 8 (eight) hours as needed for nausea or vomiting.    [provider]  pantoprazole (PROTONIX) 40 MG tablet Take 1 tablet (40 mg total) by mouth 2 (two) times daily. 03/13/18   Karen Kitchens, NP  pravastatin (PRAVACHOL) 80 MG tablet TAKE 1 TABLET(80 MG) BY MOUTH AT BEDTIME 03/09/19   Tonia Ghent, MD  prochlorperazine (COMPAZINE) 10 MG tablet Take 1 tablet (10 mg total) by mouth every 6 (six) hours as needed for nausea or vomiting. 03/31/19   Jacquelin Hawking, NP  pyridOXINE (VITAMIN B-6) 100 MG tablet Take 100 mg by mouth 3 (three) times a week.     [provider]    Allergies Flomax [tamsulosin hcl], Ibuprofen, Rosuvastatin, Sulfa antibiotics, and Sulfonamide derivatives  Family History  Problem Relation Age of Onset  . Cancer Father        liver  . Cancer Sister        breast  . Cancer Brother        skin  . Cancer Sister        breast  . Heart disease Sister        pacer  . Heart disease Sister   . Cancer Sister        breast cancer  . Heart disease Sister   . Colon cancer Neg Hx   . Prostate cancer Neg Hx     Social History Social History   Tobacco Use  . Smoking status: Never Smoker  . Smokeless tobacco: Never Used  Substance Use Topics  . Alcohol use: No    Alcohol/week: 0.0 standard drinks  . Drug use: Never    Review of Systems  Constitutional: No fever. Eyes: No redness. ENT: No sore throat. Cardiovascular: Denies chest pain. Respiratory: Denies shortness of breath. Gastrointestinal: No vomiting or diarrhea.  Positive for dysphagia. Genitourinary: Negative for flank pain.  Musculoskeletal: Negative for back  pain. Skin: Negative for rash. Neurological: Negative for headache.   ____________________________________________   PHYSICAL EXAM:  VITAL SIGNS: ED Triage Vitals  Enc Vitals Group     BP 04/12/19 1409 (!) 150/73     Pulse Rate 04/12/19 1409 87     Resp 04/12/19 1409 16  Temp 04/12/19 1409 97.8 F (36.6 C)     Temp Source 04/12/19 1409 Oral     SpO2 04/12/19 1406 97 %     Weight 04/12/19 1410 124 lb (56.2 kg)     Height 04/12/19 1410 5' (1.524 m)     Head Circumference --      Peak Flow --      Pain Score 04/12/19 1410 0     Pain Loc --      Pain Edu? --      Excl. in [place]? --     Constitutional: Alert and oriented.  Somewhat frail-appearing but in no acute distress. Eyes: Conjunctivae are normal.  Head: Atraumatic. Nose: No congestion/rhinnorhea. Mouth/Throat: Mucous membranes are moist.   Neck: Normal range of motion.  Cardiovascular: Good peripheral circulation. Respiratory: Normal respiratory effort.   Gastrointestinal: No distention.  Musculoskeletal: Mild right lower extremity edema which patient states is chronic.  Extremities warm and well perfused.  Neurologic:  Normal speech and language. No gross focal neurologic deficits are appreciated.  Skin:  Skin is warm and dry. No rash noted. Psychiatric: Mood and affect are normal. Speech and behavior are normal.  ____________________________________________   LABS (all labs ordered are listed, but only abnormal results are displayed)  Labs Reviewed  BASIC METABOLIC PANEL - Abnormal; Notable for the following components:      Result Value   Potassium 3.1 (*)    Glucose, Bld 143 (*)    Creatinine, Ser 1.25 (*)    Calcium 8.6 (*)    GFR calc non Af Amer 52 (*)    All other components within normal limits  HEPATIC FUNCTION PANEL - Abnormal; Notable for the following components:   Total Protein 6.2 (*)    Albumin 3.4 (*)    All other components within normal limits  CBC WITH DIFFERENTIAL/PLATELET -  Abnormal; Notable for the following components:   RBC 3.99 (*)    Hemoglobin 11.3 (*)    HCT 34.4 (*)    RDW 16.0 (*)    Lymphs Abs 0.3 (*)    All other components within normal limits  LACTIC ACID, PLASMA  LACTIC ACID, PLASMA  URINALYSIS, COMPLETE (UACMP) WITH MICROSCOPIC   ____________________________________________  EKG   ____________________________________________  RADIOLOGY    ____________________________________________   PROCEDURES  Procedure(s) performed: No  Procedures  Critical Care performed: No ____________________________________________   INITIAL IMPRESSION / ASSESSMENT AND PLAN / ED COURSE  Pertinent labs & imaging results that were available during my care of the patient were reviewed by me and considered in my medical decision making (see chart for details).  85 year old male with history of esophageal cancer presents with persistent recent dysphagia and worsening decreased p.o. intake.  The daughter also reported to EMS that the patient had been dizzy and confused last night.  On exam the patient is relatively comfortable appearing.  His vital signs are normal.  The physical exam is otherwise as described above.  I reviewed the past medical records in [place].  The patient is currently being followed by oncology here for distal esophageal adenocarcinoma.  The patient received prior chemotherapy and radiation but has had persistent dysphagia.  He was evaluated by radiation oncology 5 days ago and is scheduled for additional radiation therapy this week.  Overall I suspect that the patient is dehydrated and may have electrolyte abnormalities related to his decreased p.o. intake.  We will obtain lab work-up, give a fluid bolus, and reassess.  If the labs are  reassuring it is possible that the patient may be able to go home since he has follow-up and additional treatment plan this week.  If he appears significantly dehydrated he may need admission.  I  discussed this plan with the patient's daughter over the phone and she agrees.  She feels comfortable with him being at home if he has no worrisome lab abnormalities, as the dysphagia has been a long-term issue.  ----------------------------------------- 3:13 PM on 04/12/2019 -----------------------------------------  Labs and fluids pending.  I am signing the patient out to the oncoming physician Dr. Jari Pigg.  ____________________________________________   FINAL CLINICAL IMPRESSION(S) / ED DIAGNOSES  Final diagnoses:  Esophageal dysphagia      NEW MEDICATIONS STARTED DURING THIS VISIT:  New Prescriptions   No medications on file     Note:  This document was prepared using Dragon voice recognition software and may include unintentional dictation errors.    Arta Silence, MD 04/12/19 [phone]

## 2019-04-12 NOTE — Discharge Instructions (Addendum)
Follow-up with the radiation oncologist tomorrow as planned.  Your potassium was slightly low at 3.1 and we gave you some liquid repletion.  You will need to have this rechecked in a few days to make sure is not getting any lower.  Return to the ER for new, worsening, or persistent weakness, inability to tolerate any liquids or solids, or any other new or worsening symptoms that are concerning.

## 2019-04-12 NOTE — ED Triage Notes (Addendum)
Pt from home via [facility]. Per EMS, pt presents with a hx of esophageal cancer, pt has been c/o of poor food/fluid intake. Per pt "tried to eat but everything comes right back out". Pt denies N/V. [facility] presents with daughter's Rodena Piety) note  reporting pt c/o dizziness yesterday 7/18 at 11pm and confusion at 1am, 2am and 3am; note st pt was up at 8am today and fell.  NAd noted upon arrival.

## 2019-04-12 NOTE — ED Notes (Signed)
Pt st "I feel a lot better now, before I couldn't take a step". This RN will continue monitor pt

## 2019-04-12 NOTE — ED Notes (Signed)
This RN has verbal permission from pt to speak with Arnette Norris (grandson) [phone] and Rodena Piety (daughter) [phone] to provide pt's status update. This RN contacted Arnette Norris and Rodena Piety with pt's status update.

## 2019-04-12 NOTE — ED Notes (Addendum)
Pt st feeling "weak". Pt denies N/V/pain at this time.

## 2019-04-13 ENCOUNTER — Other Ambulatory Visit: Payer: Self-pay

## 2019-04-13 ENCOUNTER — Ambulatory Visit
Admission: RE | Admit: 2019-04-13 | Discharge: 2019-04-13 | Disposition: A | Discharge status: Home / Self Care | Payer: Medicare Other | Source: Ambulatory Visit | Attending: Radiation Oncology | Admitting: Radiation Oncology

## 2019-04-13 DIAGNOSIS — Z510 Encounter for antineoplastic radiation therapy: Secondary | ICD-10-CM | POA: Insufficient documentation

## 2019-04-13 DIAGNOSIS — C155 Malignant neoplasm of lower third of esophagus: Secondary | ICD-10-CM | POA: Diagnosis not present

## 2019-04-13 NOTE — Telephone Encounter (Signed)
Aaron Becker (Key: [id number]) Cuvposa 1MG [name] solution Status: PA Response - Approved Created: July 17th, 2020 Sent: July 17th, 2020

## 2019-04-14 ENCOUNTER — Other Ambulatory Visit: Payer: Self-pay | Admitting: Oncology

## 2019-04-14 ENCOUNTER — Telehealth: Payer: Self-pay | Admitting: Family Medicine

## 2019-04-14 DIAGNOSIS — Z510 Encounter for antineoplastic radiation therapy: Secondary | ICD-10-CM | POA: Diagnosis not present

## 2019-04-14 DIAGNOSIS — C155 Malignant neoplasm of lower third of esophagus: Secondary | ICD-10-CM | POA: Diagnosis not present

## 2019-04-14 MED ORDER — ONDANSETRON HCL 4 MG/5ML PO SOLN: ORAL | 0 refills | Status: AC

## 2019-04-14 NOTE — Telephone Encounter (Signed)
Called and LMOVM, no confidential info.  Will try to get in touch tomorrow.

## 2019-04-14 NOTE — Telephone Encounter (Signed)
Joaquim Lai (significant other) called today and cancelled patients 3 month follow up visit on 8/4.   She stated that the patient will start radiation on Thursday and will do this everyday.  Joaquim Lai stated that the patient's Tumor is much worse and the patient is very sick.  She stated that the patient's weight is now at 120.  And she wanted to give you an update.   She is not sure what she should do about the follow up visit since the patient will be getting radiation every day   [place] C/B # [phone]

## 2019-04-15 NOTE — Telephone Encounter (Signed)
See below.  Please get update on patient Thursday if he has not already gone to the emergency room or his follow-up appointment.  Thanks. =======================  I called again a few minutes ago.  Talked with Joaquim Lai and also talked with patient briefly.  Patient didn't feel well enough to talk much at this point.  He has had more vomiting today.  No urine output since this afternoon.  Joaquim Lai has been trying to give him sips of fluids and give him his nausea medication.  He is not complaining of pain.  We discussed options.  He has follow-up pending for tomorrow.  If he is not responding to the nausea medication in the meantime and if there is a concern for dehydration then I think it makes a lot of sense for the patient to get seen tonight for consideration of IV fluids.  If his condition turns around in response to the nausea medication to where he can maintain adequate oral hydration overnight, then I think it makes sense to just keep his appointment as scheduled tomorrow.  Joaquim Lai is going to call the patient's children and update them about his situation.  I appreciate the help of all involved.

## 2019-04-16 ENCOUNTER — Other Ambulatory Visit: Payer: Self-pay | Admitting: *Deleted

## 2019-04-16 ENCOUNTER — Inpatient Hospital Stay: Payer: Medicare Other

## 2019-04-16 ENCOUNTER — Inpatient Hospital Stay (HOSPITAL_BASED_OUTPATIENT_CLINIC_OR_DEPARTMENT_OTHER): Payer: Medicare Other | Admitting: Hospice and Palliative Medicine

## 2019-04-16 ENCOUNTER — Ambulatory Visit
Admission: RE | Admit: 2019-04-16 | Discharge: 2019-04-16 | Disposition: A | Discharge status: Home / Self Care | Payer: Medicare Other | Source: Ambulatory Visit | Attending: Radiation Oncology | Admitting: Radiation Oncology

## 2019-04-16 ENCOUNTER — Other Ambulatory Visit: Payer: Self-pay

## 2019-04-16 VITALS — BP 152/76 | HR 75 | Temp 98.1°F | Wt 130.0 lb

## 2019-04-16 DIAGNOSIS — R5383 Other fatigue: Secondary | ICD-10-CM

## 2019-04-16 DIAGNOSIS — Z7189 Other specified counseling: Secondary | ICD-10-CM

## 2019-04-16 DIAGNOSIS — E860 Dehydration: Secondary | ICD-10-CM

## 2019-04-16 DIAGNOSIS — Z79899 Other long term (current) drug therapy: Secondary | ICD-10-CM | POA: Diagnosis not present

## 2019-04-16 DIAGNOSIS — Z8719 Personal history of other diseases of the digestive system: Secondary | ICD-10-CM | POA: Diagnosis not present

## 2019-04-16 DIAGNOSIS — Z7984 Long term (current) use of oral hypoglycemic drugs: Secondary | ICD-10-CM

## 2019-04-16 DIAGNOSIS — Z515 Encounter for palliative care: Secondary | ICD-10-CM

## 2019-04-16 DIAGNOSIS — E119 Type 2 diabetes mellitus without complications: Secondary | ICD-10-CM

## 2019-04-16 DIAGNOSIS — I10 Essential (primary) hypertension: Secondary | ICD-10-CM | POA: Diagnosis not present

## 2019-04-16 DIAGNOSIS — I2510 Atherosclerotic heart disease of native coronary artery without angina pectoris: Secondary | ICD-10-CM

## 2019-04-16 DIAGNOSIS — Z510 Encounter for antineoplastic radiation therapy: Secondary | ICD-10-CM | POA: Diagnosis not present

## 2019-04-16 DIAGNOSIS — Z9221 Personal history of antineoplastic chemotherapy: Secondary | ICD-10-CM | POA: Diagnosis not present

## 2019-04-16 DIAGNOSIS — M1990 Unspecified osteoarthritis, unspecified site: Secondary | ICD-10-CM | POA: Diagnosis not present

## 2019-04-16 DIAGNOSIS — R634 Abnormal weight loss: Secondary | ICD-10-CM | POA: Diagnosis not present

## 2019-04-16 DIAGNOSIS — E785 Hyperlipidemia, unspecified: Secondary | ICD-10-CM | POA: Diagnosis not present

## 2019-04-16 DIAGNOSIS — R531 Weakness: Secondary | ICD-10-CM

## 2019-04-16 DIAGNOSIS — C159 Malignant neoplasm of esophagus, unspecified: Secondary | ICD-10-CM

## 2019-04-16 DIAGNOSIS — R630 Anorexia: Secondary | ICD-10-CM | POA: Diagnosis not present

## 2019-04-16 DIAGNOSIS — Z923 Personal history of irradiation: Secondary | ICD-10-CM | POA: Diagnosis not present

## 2019-04-16 DIAGNOSIS — E039 Hypothyroidism, unspecified: Secondary | ICD-10-CM | POA: Diagnosis not present

## 2019-04-16 DIAGNOSIS — C155 Malignant neoplasm of lower third of esophagus: Secondary | ICD-10-CM | POA: Diagnosis not present

## 2019-04-16 DIAGNOSIS — R5381 Other malaise: Secondary | ICD-10-CM | POA: Diagnosis not present

## 2019-04-16 DIAGNOSIS — R1319 Other dysphagia: Secondary | ICD-10-CM | POA: Diagnosis not present

## 2019-04-16 DIAGNOSIS — C169 Malignant neoplasm of stomach, unspecified: Secondary | ICD-10-CM

## 2019-04-16 LAB — BASIC METABOLIC PANEL
Anion gap: 8 (ref 5–15)
BUN: 14 mg/dL (ref 8–23)
CO2: 28 mmol/L (ref 22–32)
Calcium: 8.9 mg/dL (ref 8.9–10.3)
Chloride: 99 mmol/L (ref 98–111)
Creatinine, Ser: 1.15 mg/dL (ref 0.61–1.24)
GFR calc Af Amer: >60 mL/min (ref >60)
GFR calc non Af Amer: 57 mL/min — ABNORMAL LOW (ref >60)
Glucose, Bld: 220 mg/dL — ABNORMAL HIGH (ref 70–99)
Potassium: 3.4 mmol/L — ABNORMAL LOW (ref 3.5–5.1)
Sodium: 135 mmol/L (ref 135–145)

## 2019-04-16 MED ORDER — SODIUM CHLORIDE 0.9 % IV SOLN
Freq: Once | INTRAVENOUS | Status: AC
  Administered 2019-04-16: 15:00:00 via INTRAVENOUS
  Filled 2019-04-16: qty 250

## 2019-04-16 NOTE — Progress Notes (Signed)
Symptom Management [place]  [phone] [phone]  Patient Care Team: Tonia Ghent, MD as PCP - General (Family Medicine) Bary Castilla, Forest Gleason, MD (General Surgery) Modesto Charon, MD (Family Medicine) Lucilla Lame, MD as Consulting Physician (Gastroenterology) Lequita Asal, MD as Referring Physician (Hematology and Oncology) Clent Jacks, RN as Registered Nurse Noreene Filbert, MD as Referring Physician (Radiation Oncology) Sindy Guadeloupe, MD as Consulting Physician (Oncology)   Name of the patient: Aaron Becker  [id number]  05/30/1930   Date of visit: 04/16/19  Diagnosis- esophageal cancer  Chief complaint/ Reason for visit-poor oral intake, concern for dehydration  Heme/Onc history:  Oncology History Overview Note  Aaron Becker is a 85 y.o. male with clinical stage IIB esophageal cancer (uT2N0).  He presented with an upper GI bleed.  EGD on 06/11/2017 revealed one cratered esophageal ulcer and stigmata of recent esophogeal bleeding at the GE junction.  Biopsy revealed poorly differentiated adenocarcinoma, signet ring type.  There was one non-bleeding cratered gastric ulcer with no stigmata of bleeding in the gastric antrum.  Duodenum was normal.  Colonoscopy on 03/30/2015 revealed a 20 mm tubulovillous adenoma of the rectum.  Flexible sigmoidoscopy on 06/01/2017 revealed a sessile polyp in the rectum and grade II non-bleeding internal hemorrhoids.  PET scan  on 06/27/2017 that revealed no appreciable abnormal esophageal hypermetabolic activity to correspond with the reported tumor. There was no adenopathy or other findings of metastatic disease. There was question that the original tumor is either low-grade or very small.  Upper endoscopic ultrasound (EUS) on 07/04/2017 revealed a 1-2cm ulcerated mass at the GE junction that was partially circumferential and nonobstructive. The mass is 1 cm in thickness  and occupies 3/4 of the  lumen circumference at the GE junction. The mass clearly passes into, but not through the muscularis propria layer of the esophageal wall. There was no paraesophageal adenopathy. Clinical stage was uT2N0 GE junction adenocarcinoma.  He received 5 weeks of carboplatin and Taxol with radiation (07/29/2017 - 09/03/2017).  Radiation completed on 23-Dec-202018.  PET scan on 12/04/2017 revealed a small focus of mild uptake (SUV 3.77; background 2.73) localizing to the caudate lobe of liver. There was no corresponding CT abnormality.  Upper endoscopy on 12/31/2017 revealed erythematous mucosa in the esophagus.  Biopsy revealed no malignancy.   EGD on 05/08/2018 revealed a normal esophagus and congested mucosa in the cardia.  GE junction biopsy revealed squamocolumnar mucosa and columnar-lined mucosa with mild chronic inflammation, negative for goblet cells, dysplasia and malignancy. 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  PET scan on 05/20/2018 revealed no FDG evidence of esophageal carcinoma.  There was no evidence of distant metastatic disease.  PDL-1 testing revealed a CPS score of 2 (expressed).  MMR/MSI was intact.  Her2/neu was 1+.  There was not enough tissue for EGFR testing for [facility]   Esophageal cancer, stage IIB ([place])  07/11/2017 Initial Diagnosis   Esophageal cancer, stage IIB ([facility])     Interval history-   Patient was seen in the ER on 04/12/2019 and found to have dehydration.  Patient clinically improved with half a liter of normal saline and was discharged home.  He presented to radiation oncology today for first treatment of 3-week course and was asked to be evaluated by [facility] due to poor oral intake and concern for recurrent dehydration.  Patient actually states that he feels improved over the past several  days.  He denies any neurologic complaints. Denies recent fevers or illnesses. Denies any  easy bleeding or bruising. Denies chest pain. Denies any nausea, vomiting, constipation, or diarrhea. Denies urinary complaints. Patient offers no further specific complaints today.  ECOG FS:1 - Symptomatic but completely ambulatory  Review of systems- Review of Systems  Constitutional: Positive for weight loss. Negative for chills and fever.  Respiratory: Negative for cough and shortness of breath.   Cardiovascular: Positive for leg swelling. Negative for chest pain.  Gastrointestinal: Negative for abdominal pain, constipation, diarrhea, nausea and vomiting.  Genitourinary: Negative for dysuria, frequency and urgency.  Neurological: Negative for focal weakness.     Current treatment-XRT  Allergies  Allergen Reactions   Flomax [Tamsulosin Hcl] Other (See Comments)    syncope   Ibuprofen Other (See Comments)    Oral blisters at high doses   Rosuvastatin     REACTION: muscle stiffness   Sulfa Antibiotics Rash   Sulfonamide Derivatives Rash    Past Medical History:  Diagnosis Date   Anemia    Arthritis    hands   Bradycardia    Colon polyp    Diabetes mellitus, type II ([place]) 1986   oral meds   Dysphasia    Elevated PSA 2011   per Dr. Jacqlyn Larsen with Uro no problems last 5 or 6 yrs   Esophageal cancer ([place]) dx sept 18 2018   Excessive oral secretions    HOH (hard of hearing)    bilateral AIDES   Hyperlipidemia    Hypertension 02/02   off bp meds since hospitalization 3 weeks ago   Hypothyroidism 1980's   Palpitations    Right rib fracture 2015   SVT (supraventricular tachycardia) ([place])    a. reported SVT during admission at [facility] 06/2014   Syncope and collapse    none recent last few weeks   Wears dentures    upper and lower    Past Surgical History:  Procedure Laterality Date   [place], 09/2015   COMPRESSED FRACTURE SPINE   carotid ultrasound  02/02   [facility]   CATARACT EXTRACTION W/PHACO Right 03/19/2016   Procedure: CATARACT  EXTRACTION PHACO AND INTRAOCULAR LENS PLACEMENT ([place]);  Surgeon: Ronnell Freshwater, MD;  Location: [facility];  Service: Ophthalmology;  Laterality: Right;  DIABETIC-oral med RIGHT   CATARACT EXTRACTION W/PHACO Left 04/23/2016   Procedure: CATARACT EXTRACTION PHACO AND INTRAOCULAR LENS PLACEMENT ([facility]);  Surgeon: Ronnell Freshwater, MD;  Location: [facility];  Service: Ophthalmology;  Laterality: Left;  LEFT DIABETIC - oral meds   CHOLECYSTECTOMY  1967   COLONOSCOPY W/ POLYPECTOMY  2012   Villous adenoma from the rectum without high-grade dysplasia, 10 mm   COLONOSCOPY WITH PROPOFOL N/A 03/30/2015   Procedure: COLONOSCOPY WITH PROPOFOL;  Surgeon: Robert Bellow, MD;  Location: [facility] ENDOSCOPY;  Service: Endoscopy;  Laterality: N/A;   CYST EXCISION     thumb   CYSTECTOMY  06/14/04   mucous cyst excision with left thumb, IP joint debridement   ESOPHAGOGASTRODUODENOSCOPY (EGD) WITH PROPOFOL N/A 06/11/2017   Procedure: ESOPHAGOGASTRODUODENOSCOPY (EGD) WITH PROPOFOL;  Surgeon: Lucilla Lame, MD;  Location: [facility] ENDOSCOPY;  Service: Endoscopy;  Laterality: N/A;   ESOPHAGOGASTRODUODENOSCOPY (EGD) WITH PROPOFOL N/A 12/31/2017   Procedure: ESOPHAGOGASTRODUODENOSCOPY (EGD) WITH PROPOFOL;  Surgeon: [name], MD;  Location: [facility] ENDOSCOPY;  Service: Endoscopy;  Laterality: N/A;   ESOPHAGOGASTRODUODENOSCOPY (EGD) WITH PROPOFOL N/A 05/08/2018   Procedure: ESOPHAGOGASTRODUODENOSCOPY (EGD) WITH PROPOFOL;  Surgeon: [name], MD;  Location: [facility] ENDOSCOPY;  Service: Gastroenterology;  Laterality: N/A;   ESOPHAGOGASTRODUODENOSCOPY (EGD) WITH PROPOFOL N/A 03/20/2019   Procedure: ESOPHAGOGASTRODUODENOSCOPY (EGD) WITH PROPOFOL;  Surgeon: [name], MD;  Location: [facility] ENDOSCOPY;  Service: Gastroenterology;  Laterality: N/A;   EUS N/A 07/04/2017   Procedure: UPPER ENDOSCOPIC ULTRASOUND (EUS) RADIAL;  Surgeon: [name], MD;  Location: [facility] ENDOSCOPY;  Service:  Endoscopy;  Laterality: N/A;   EYE SURGERY     FLEXIBLE SIGMOIDOSCOPY N/A 06/11/2017   Procedure: FLEXIBLE SIGMOIDOSCOPY;  Surgeon: Lucilla Lame, MD;  Location: [facility] ENDOSCOPY;  Service: Endoscopy;  Laterality: N/A;   HERNIA REPAIR  2013   KYPHOPLASTY N/A 10/06/2015   Procedure: Thoracic twelve kyphoplasty;  Surgeon: Karie Chimera, MD;  Location: [place] NEURO ORS;  Service: Neurosurgery;  Laterality: N/A;  T12 Kyphoplasty   PROSTATE BIOPSY  11/01/08   Dr. Reece Agar   VARICOSE VEIN SURGERY  1975   VASECTOMY  1975    Social History   Socioeconomic History   Marital status: Widowed    Spouse name: Not on file   Number of children: Not on file   Years of education: Not on file   Highest education level: Not on file  Occupational History   Occupation: Pharmacist, community: retired    Comment: Retired 1999  Scientist, product/process development strain: Not on file   Food insecurity    Worry: Not on file    Inability: Not on Lexicographer needs    Medical: Not on file    Non-medical: Not on file  Tobacco Use   Smoking status: Never Smoker   Smokeless tobacco: Never Used  Substance and Sexual Activity   Alcohol use: No    Alcohol/week: 0.0 standard drinks   Drug use: Never   Sexual activity: Never  Lifestyle   Physical activity    Days per week: Not on file    Minutes per session: Not on file   Stress: Not on file  Relationships   Social connections    Talks on phone: Not on file    Gets together: Not on file    Attends religious service: Not on file    Active member of club or organization: Not on file    Attends meetings of clubs or organizations: Not on file    Relationship status: Not on file   Intimate partner violence    Fear of current or ex partner: Not on file    Emotionally abused: Not on file    Physically abused: Not on file    Forced sexual activity: Not on file  Other Topics Concern   Not on file  Social  History Narrative   Mar[place]- widower 2000 ([place]), not marred but with partner since 2001.    Retired from [employer].     [employer] '[employer], stationed in [place]    Family History  Problem Relation Age of Onset   Cancer Father        liver   Cancer Sister        breast   Cancer Brother        skin   Cancer Sister        breast   Heart disease Sister        pacer   Heart disease Sister    Cancer Sister        breast cancer   Heart disease Sister    Colon cancer Neg Hx  Prostate cancer Neg Hx      Current Outpatient Medications:    acetaminophen (TYLENOL) 500 MG tablet, Take 500-1,000 mg by mouth every 6 (six) hours as needed (for pain.)., Disp: , Rfl:    Ascorbic Acid (VITAMIN C) 1000 MG tablet, Take 1,000 mg by mouth daily., Disp: , Rfl:    atropine 1 % ophthalmic solution, Place 2 drops under the tongue 3 (three) times daily., Disp: 2 mL, Rfl: 12   benzonatate (TESSALON) 100 MG capsule, Take 100 mg by mouth at bedtime as needed for cough., Disp: , Rfl:    Blood Glucose Monitoring Suppl (ONE TOUCH ULTRA 2) w/Device KIT, Use once to twice daily as needed Dx E11.9 (Patient not taking: Reported on 03/20/2019), Disp: 1 each, Rfl: 0   diphenhydrAMINE (BENADRYL) 25 mg capsule, Take 25 mg by mouth at bedtime as needed (for sleep.). , Disp: , Rfl:    docusate sodium (COLACE) 100 MG capsule, Take 100 mg by mouth 2 (two) times daily as needed (for constipation)., Disp: , Rfl:    furosemide (LASIX) 20 MG tablet, Take 1-2 tablets (20-40 mg total) by mouth daily as needed for fluid., Disp: , Rfl:    glimepiride (AMARYL) 1 MG tablet, TAKE 2 TABLETS(2 MG) BY MOUTH DAILY WITH BREAKFAST, Disp: 180 tablet, Rfl: 3   glucose blood (ONE TOUCH ULTRA TEST) test strip, TO TEST BLOOD SUGAR ONCE OR TWICE DAILY AND AS DIRECTED DX E11.9.  H/o variable blood sugars., Disp: 200 each, Rfl: 1   Glycopyrrolate 1 MG/5ML SOLN, Take 5 mLs (1 mg total) by mouth every morning., Disp: 1350 mL, Rfl:  0   levothyroxine (SYNTHROID) 75 MCG tablet, TAKE 1 TABLET(75 MCG) BY MOUTH DAILY, Disp: 90 tablet, Rfl: 1   metFORMIN (GLUCOPHAGE) 500 MG tablet, TAKE 1 TABLET BY MOUTH EVERY MORNING AND 2 TABLETS BY MOUTH AT SUPPER AND 1 TABLET AT NIGHT IF NEEDED, Disp: 360 tablet, Rfl: 0   Multiple Vitamin (MULTIVITAMIN WITH MINERALS) TABS tablet, Take 1 tablet by mouth daily. Centrum Silver, Disp: , Rfl:    ondansetron (ZOFRAN ODT) 4 MG disintegrating tablet, Take 1 tablet (4 mg total) by mouth every 8 (eight) hours as needed for nausea or vomiting., Disp: 90 tablet, Rfl: 3   ondansetron (ZOFRAN) 4 MG tablet, Take 4 mg by mouth every 8 (eight) hours as needed for nausea or vomiting., Disp: , Rfl:    ondansetron (ZOFRAN) 4 MG/5ML solution, Take 10 mLs (8 mg total) by mouth every 8 hours as needed for nausea., Disp: 150 mL, Rfl: 0   pantoprazole (PROTONIX) 40 MG tablet, Take 1 tablet (40 mg total) by mouth 2 (two) times daily., Disp: 180 tablet, Rfl: 3   pravastatin (PRAVACHOL) 80 MG tablet, TAKE 1 TABLET(80 MG) BY MOUTH AT BEDTIME, Disp: 90 tablet, Rfl: 3   prochlorperazine (COMPAZINE) 10 MG tablet, Take 1 tablet (10 mg total) by mouth every 6 (six) hours as needed for nausea or vomiting., Disp: 30 tablet, Rfl: 0   pyridOXINE (VITAMIN B-6) 100 MG tablet, Take 100 mg by mouth 3 (three) times a week. , Disp: , Rfl:   Physical exam:  Vitals:   04/16/19 1438  BP: (!) 152/76  Pulse: 75  Temp: 98.1 F (36.7 C)  TempSrc: Tympanic  Weight: 130 lb (59 kg)   Physical Exam Constitutional:      Appearance: Normal appearance.  HENT:     Mouth/Throat:     Mouth: Mucous membranes are moist.  Cardiovascular:  Rate and Rhythm: Normal rate and regular rhythm.  Pulmonary:     Breath sounds: Normal breath sounds.  Abdominal:     General: Bowel sounds are normal.     Palpations: Abdomen is soft.  Skin:    General: Skin is warm and dry.  Neurological:     General: No focal deficit present.     Mental  Status: He is alert and oriented to person, place, and time.      CMP Latest Ref Rng & Units 04/16/2019  Glucose 70 - 99 mg/dL 220(H)  BUN 8 - 23 mg/dL 14  Creatinine 0.61 - 1.24 mg/dL 1.15  Sodium 135 - 145 mmol/L 135  Potassium 3.5 - 5.1 mmol/L 3.4(L)  Chloride 98 - 111 mmol/L 99  CO2 22 - 32 mmol/L 28  Calcium 8.9 - 10.3 mg/dL 8.9  Total Protein 6.5 - 8.1 g/dL -  Total Bilirubin 0.3 - 1.2 mg/dL -  Alkaline Phos 38 - 126 U/L -  AST 15 - 41 U/L -  ALT 0 - 44 U/L -   CBC Latest Ref Rng & Units 04/12/2019  WBC 4.0 - 10.5 K/uL 7.1  Hemoglobin 13.0 - 17.0 g/dL 11.3(L)  Hematocrit 39.0 - 52.0 % 34.4(L)  Platelets 150 - 400 K/uL 189    No images are attached to the encounter.  Nm Pet Image Restag (ps) Skull Base To Thigh  Result Date: 04/03/2019 CLINICAL DATA:  Subsequent treatment strategy for esophageal carcinoma. EXAM: NUCLEAR MEDICINE PET SKULL BASE TO THIGH TECHNIQUE: 7.12 mCi F-18 FDG was injected intravenously. Full-ring PET imaging was performed from the skull base to thigh after the radiotracer. CT data was obtained and used for attenuation correction and anatomic localization. Fasting blood glucose: 98 mg/dl COMPARISON:  PET-CT 05/20/2018. FINDINGS: Mediastinal blood pool activity: SUV max 1.89 Liver activity: SUV max NA NECK: No hypermetabolic lymph nodes in the neck. Incidental CT findings: none CHEST: No FDG avid axillary, supraclavicular, mediastinal, or hilar lymph nodes. No suspicious pulmonary nodules. Corresponding to the increased soft tissue on recent chest CT from 03/02/2019 there is FDG avid tumor involving the distal esophagus and proximal stomach. This extends across the GE junction measuring approximately 6.9 cm in length with an SUV max of 6.34. Incidental CT findings: Aortic atherosclerosis. Calcification in the left main, lad and RCA coronary arteries noted. ABDOMEN/PELVIS: There is no abnormal uptake within the liver, pancreas, spleen, or adrenal glands. No  hypermetabolic lymph nodes within the abdomen or pelvis. Incidental CT findings: Aortic atherosclerosis. No aneurysm. Prostate gland enlargement. SKELETON: No focal hypermetabolic activity to suggest skeletal metastasis. Chronic compression deformity is noted involving T12, status post kyphoplasty. Incidental CT findings: none IMPRESSION: 1. Distal esophageal and proximal gastric lesion is FDG avid within SUV max of 6.34. 2. No findings to suggest FDG avid nodal metastasis or distant metastatic disease. 3. Aortic Atherosclerosis (ICD10-I70.0). Coronary artery calcifications. Electronically Signed   By: Kerby Moors M.D.   On: 04/03/2019 15:37    Assessment and plan- Patient is a 85 y.o. male with stage IIb esophageal cancer diagnosed September 2018 status post carbo/Taxol and radiation completed December 2018.  Patient reestablish care in our clinic in June 2020 with history of weight loss and dysphagia.  EGD was done which with findings of luminal narrowing due to tumor.  He has initiated XRT with first treatment today 04/16/2019.  1.  Esophageal cancer -patient started XRT today with plan for 15 fractions over the next 3 weeks.  Patient has scheduled  appointment with Dr. Janese Banks in October but will request that appointment be moved to early September.  We will plan for follow-up visit with palliative care in 2 weeks.  2.  Dehydration - we will give 500 mL normal saline today via peripheral IV.  Will consider as needed IV infusions while patient completes radiation.  3.  Goals of care -had a lengthy discussion with patient and then met privately with patient's son and daughter.  All would like to wait to see how patient does with radiation but ultimately have the goal of ensuring that he is comfortable.  Son and daughter do not anticipate significant improvement and we discussed the option of hospice following radiation if patient still has difficulty eating and drinking.  They are not interested in  artificial nutrition via PEG.  Note that patient has had experience with a feeding tube via family member and would not want that for himself.  Family are interested in home-based palliative care with the future transition to hospice if needed.  Case and plan discussed with Dr. Janese Banks.    Visit Diagnosis 1. 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 Esophageal cancer, stage IIB ([place])   3. Goals of care, counseling/discussion     Patient expressed understanding and was in agreement with this plan. He also understands that He can call clinic at any time with any questions, concerns, or complaints.   Thank you for allowing me to participate in the care of this very pleasant patient.   Time Total: 45 minutes  Visit consisted of counseling and education dealing with the complex and emotionally intense issues of symptom management and palliative care in the setting of serious and potentially life-threatening illness.Greater than 50%  of this time was spent counseling and coordinating care related to the above assessment and plan.  Signed by: Altha Harm, PhD, NP-C [phone] (Work Cell)   CC:

## 2019-04-16 NOTE — Telephone Encounter (Signed)
Spoke with Joaquim Lai, who says he kept a little bit of pears down and is about to leave for his appointment.

## 2019-04-16 NOTE — Addendum Note (Signed)
Addended by: Irean Hong on: 04/16/2019 04:44 PM   Modules accepted: Orders

## 2019-04-17 ENCOUNTER — Other Ambulatory Visit: Payer: Self-pay

## 2019-04-17 ENCOUNTER — Ambulatory Visit
Admission: RE | Admit: 2019-04-17 | Discharge: 2019-04-17 | Disposition: A | Discharge status: Home / Self Care | Payer: Medicare Other | Source: Ambulatory Visit | Attending: Radiation Oncology | Admitting: Radiation Oncology

## 2019-04-17 DIAGNOSIS — Z510 Encounter for antineoplastic radiation therapy: Secondary | ICD-10-CM | POA: Diagnosis not present

## 2019-04-17 DIAGNOSIS — C155 Malignant neoplasm of lower third of esophagus: Secondary | ICD-10-CM | POA: Diagnosis not present

## 2019-04-17 NOTE — Telephone Encounter (Signed)
Aaron Becker called back and wanted give thanks. She stated that the [place] yesterday did give the patient fluids for dehydration.   She was not sure if you passed a message along to those doctors but did want to give you thanks and tell you how much she appreciates the care you have given them.

## 2019-04-17 NOTE — Telephone Encounter (Signed)
Noted.  I will await the follow-up report.  Thanks.

## 2019-04-18 NOTE — Telephone Encounter (Signed)
Noted. I appreciate the kind message.   Please check on patient on Monday.  Thanks.

## 2019-04-20 ENCOUNTER — Ambulatory Visit
Admission: RE | Admit: 2019-04-20 | Discharge: 2019-04-20 | Disposition: A | Discharge status: Home / Self Care | Payer: Medicare Other | Source: Ambulatory Visit | Attending: Radiation Oncology | Admitting: Radiation Oncology

## 2019-04-20 ENCOUNTER — Other Ambulatory Visit: Payer: Self-pay

## 2019-04-20 ENCOUNTER — Other Ambulatory Visit: Payer: Self-pay | Admitting: *Deleted

## 2019-04-20 DIAGNOSIS — C155 Malignant neoplasm of lower third of esophagus: Secondary | ICD-10-CM | POA: Diagnosis not present

## 2019-04-20 DIAGNOSIS — R531 Weakness: Secondary | ICD-10-CM

## 2019-04-20 DIAGNOSIS — Z510 Encounter for antineoplastic radiation therapy: Secondary | ICD-10-CM | POA: Diagnosis not present

## 2019-04-20 DIAGNOSIS — C159 Malignant neoplasm of esophagus, unspecified: Secondary | ICD-10-CM

## 2019-04-20 NOTE — Telephone Encounter (Signed)
Joaquim Lai (significant other) called today and cancelled patients 3 month follow up visit on 8/4.   She stated that the patient will start radiation on Thursday and will do this everyday.  Joaquim Lai stated that the patient's Tumor is much worse and the patient is very sick.  She stated that the patient's weight is now at 120.  And she wanted to give you an update.   She is not sure what she should do about the follow up visit since the patient will be getting radiation every day   [place] C/B # [phone]

## 2019-04-21 ENCOUNTER — Ambulatory Visit
Admission: RE | Admit: 2019-04-21 | Discharge: 2019-04-21 | Disposition: A | Discharge status: Home / Self Care | Payer: Medicare Other | Source: Ambulatory Visit | Attending: Radiation Oncology | Admitting: Radiation Oncology

## 2019-04-21 ENCOUNTER — Inpatient Hospital Stay: Payer: Medicare Other

## 2019-04-21 ENCOUNTER — Inpatient Hospital Stay (HOSPITAL_BASED_OUTPATIENT_CLINIC_OR_DEPARTMENT_OTHER): Payer: Medicare Other | Admitting: Nurse Practitioner

## 2019-04-21 VITALS — BP 155/72 | HR 76 | Temp 97.2°F | Resp 18 | Wt 125.0 lb

## 2019-04-21 DIAGNOSIS — C155 Malignant neoplasm of lower third of esophagus: Secondary | ICD-10-CM | POA: Diagnosis not present

## 2019-04-21 DIAGNOSIS — Z510 Encounter for antineoplastic radiation therapy: Secondary | ICD-10-CM | POA: Diagnosis not present

## 2019-04-21 DIAGNOSIS — Z8719 Personal history of other diseases of the digestive system: Secondary | ICD-10-CM | POA: Diagnosis not present

## 2019-04-21 DIAGNOSIS — E785 Hyperlipidemia, unspecified: Secondary | ICD-10-CM | POA: Diagnosis not present

## 2019-04-21 DIAGNOSIS — E860 Dehydration: Secondary | ICD-10-CM

## 2019-04-21 DIAGNOSIS — R1319 Other dysphagia: Secondary | ICD-10-CM | POA: Diagnosis not present

## 2019-04-21 DIAGNOSIS — C159 Malignant neoplasm of esophagus, unspecified: Secondary | ICD-10-CM | POA: Diagnosis not present

## 2019-04-21 DIAGNOSIS — E119 Type 2 diabetes mellitus without complications: Secondary | ICD-10-CM | POA: Diagnosis not present

## 2019-04-21 DIAGNOSIS — C169 Malignant neoplasm of stomach, unspecified: Secondary | ICD-10-CM | POA: Diagnosis not present

## 2019-04-21 DIAGNOSIS — E876 Hypokalemia: Secondary | ICD-10-CM

## 2019-04-21 DIAGNOSIS — I10 Essential (primary) hypertension: Secondary | ICD-10-CM | POA: Diagnosis not present

## 2019-04-21 DIAGNOSIS — Z515 Encounter for palliative care: Secondary | ICD-10-CM | POA: Diagnosis not present

## 2019-04-21 DIAGNOSIS — R531 Weakness: Secondary | ICD-10-CM

## 2019-04-21 DIAGNOSIS — R630 Anorexia: Secondary | ICD-10-CM | POA: Diagnosis not present

## 2019-04-21 DIAGNOSIS — R5383 Other fatigue: Secondary | ICD-10-CM | POA: Diagnosis not present

## 2019-04-21 DIAGNOSIS — I2510 Atherosclerotic heart disease of native coronary artery without angina pectoris: Secondary | ICD-10-CM | POA: Diagnosis not present

## 2019-04-21 DIAGNOSIS — Z9221 Personal history of antineoplastic chemotherapy: Secondary | ICD-10-CM | POA: Diagnosis not present

## 2019-04-21 DIAGNOSIS — Z923 Personal history of irradiation: Secondary | ICD-10-CM | POA: Diagnosis not present

## 2019-04-21 DIAGNOSIS — R5381 Other malaise: Secondary | ICD-10-CM | POA: Diagnosis not present

## 2019-04-21 DIAGNOSIS — R634 Abnormal weight loss: Secondary | ICD-10-CM | POA: Diagnosis not present

## 2019-04-21 DIAGNOSIS — E43 Unspecified severe protein-calorie malnutrition: Secondary | ICD-10-CM

## 2019-04-21 LAB — CBC WITH DIFFERENTIAL/PLATELET
Abs Immature Granulocytes: 0.04 10*3/uL (ref 0.00–0.07)
Basophils Absolute: 0 10*3/uL (ref 0.0–0.1)
Basophils Relative: 0 %
Eosinophils Absolute: 0 10*3/uL (ref 0.0–0.5)
Eosinophils Relative: 0 %
HCT: 35.2 % — ABNORMAL LOW (ref 39.0–52.0)
Hemoglobin: 11.6 g/dL — ABNORMAL LOW (ref 13.0–17.0)
Immature Granulocytes: 0 %
Lymphocytes Relative: 2 %
Lymphs Abs: 0.3 10*3/uL — ABNORMAL LOW (ref 0.7–4.0)
MCH: 28.6 pg (ref 26.0–34.0)
MCHC: 33 g/dL (ref 30.0–36.0)
MCV: 86.7 fL (ref 80.0–100.0)
Monocytes Absolute: 0.5 10*3/uL (ref 0.1–1.0)
Monocytes Relative: 4 %
Neutro Abs: 10.7 10*3/uL — ABNORMAL HIGH (ref 1.7–7.7)
Neutrophils Relative %: 94 %
Platelets: 180 10*3/uL (ref 150–400)
RBC: 4.06 MIL/uL — ABNORMAL LOW (ref 4.22–5.81)
RDW: 16.3 % — ABNORMAL HIGH (ref 11.5–15.5)
WBC: 11.5 10*3/uL — ABNORMAL HIGH (ref 4.0–10.5)
nRBC: 0 % (ref 0.0–0.2)

## 2019-04-21 LAB — COMPREHENSIVE METABOLIC PANEL
ALT: 19 U/L (ref 0–44)
AST: 20 U/L (ref 15–41)
Albumin: 3.5 g/dL (ref 3.5–5.0)
Alkaline Phosphatase: 64 U/L (ref 38–126)
Anion gap: 13 (ref 5–15)
BUN: 31 mg/dL — ABNORMAL HIGH (ref 8–23)
CO2: 26 mmol/L (ref 22–32)
Calcium: 9.2 mg/dL (ref 8.9–10.3)
Chloride: 100 mmol/L (ref 98–111)
Creatinine, Ser: 1.5 mg/dL — ABNORMAL HIGH (ref 0.61–1.24)
GFR calc Af Amer: 48 mL/min — ABNORMAL LOW (ref >60)
GFR calc non Af Amer: 42 mL/min — ABNORMAL LOW (ref >60)
Glucose, Bld: 168 mg/dL — ABNORMAL HIGH (ref 70–99)
Potassium: 3.2 mmol/L — ABNORMAL LOW (ref 3.5–5.1)
Sodium: 139 mmol/L (ref 135–145)
Total Bilirubin: 0.9 mg/dL (ref 0.3–1.2)
Total Protein: 6.8 g/dL (ref 6.5–8.1)

## 2019-04-21 MED ORDER — SODIUM CHLORIDE 0.9 % IV SOLN
Freq: Once | INTRAVENOUS | Status: AC
  Administered 2019-04-21: 13:00:00 via INTRAVENOUS
  Filled 2019-04-21: qty 250

## 2019-04-21 MED ORDER — DEXAMETHASONE SODIUM PHOSPHATE 10 MG/ML IJ SOLN
10.0000 mg | Freq: Once | INTRAMUSCULAR | Status: AC
  Administered 2019-04-21: 10 mg via INTRAVENOUS
  Filled 2019-04-21: qty 1

## 2019-04-21 NOTE — Progress Notes (Signed)
Symptom Management [place]  Telephone:(336) [phone] Fax:([phone]) [phone]  Patient Care Team: Tonia Ghent, MD as PCP - General (Family Medicine) Bary Castilla, Forest Gleason, MD (General Surgery) Modesto Charon, MD (Family Medicine) Lucilla Lame, MD as Consulting Physician (Gastroenterology) Lequita Asal, MD as Referring Physician (Hematology and Oncology) Clent Jacks, RN as Registered Nurse Noreene Filbert, MD as Referring Physician (Radiation Oncology) Sindy Guadeloupe, MD as Consulting Physician (Oncology)   Name of the patient: Aaron Becker  [id number]  1930-09-19   Date of visit: 04/21/19  Diagnosis-clinical stage IIb esophageal cancer  Chief complaint/ Reason for visit-poor oral intake and concern for dehydration  Heme/Onc history:  Oncology History Overview Note  Aaron Becker is a 85 y.o. male with clinical stage IIB esophageal cancer (uT2N0).  He presented with an upper GI bleed.  EGD on 06/11/2017 revealed one cratered esophageal ulcer and stigmata of recent esophogeal bleeding at the GE junction.  Biopsy revealed poorly differentiated adenocarcinoma, signet ring type.  There was one non-bleeding cratered gastric ulcer with no stigmata of bleeding in the gastric antrum.  Duodenum was normal.  Colonoscopy on 03/30/2015 revealed a 20 mm tubulovillous adenoma of the rectum.  Flexible sigmoidoscopy on 06/01/2017 revealed a sessile polyp in the rectum and grade II non-bleeding internal hemorrhoids.  PET scan  on 06/27/2017 that revealed no appreciable abnormal esophageal hypermetabolic activity to correspond with the reported tumor. There was no adenopathy or other findings of metastatic disease. There was question that the original tumor is either low-grade or very small.  Upper endoscopic ultrasound (EUS) on 07/04/2017 revealed a 1-2cm ulcerated mass at the GE junction that was partially circumferential and nonobstructive. The mass  is 1 cm in thickness and occupies 3/4 of the  lumen circumference at the GE junction. The mass clearly passes into, but not through the muscularis propria layer of the esophageal wall. There was no paraesophageal adenopathy. Clinical stage was uT2N0 GE junction adenocarcinoma.  He received 5 weeks of carboplatin and Taxol with radiation (07/29/2017 - 09/03/2017).  Radiation completed on 12/01/202018.  PET scan on 12/04/2017 revealed a small focus of mild uptake (SUV 3.77; background 2.73) localizing to the caudate lobe of liver. There was no corresponding CT abnormality.  Upper endoscopy on 12/31/2017 revealed erythematous mucosa in the esophagus.  Biopsy revealed no malignancy.   EGD on 05/08/2018 revealed a normal esophagus and congested mucosa in the cardia.  GE junction biopsy revealed squamocolumnar mucosa and columnar-lined mucosa with mild chronic inflammation, negative for goblet cells, dysplasia and malignancy. 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  PET scan on 05/20/2018 revealed no FDG evidence of esophageal carcinoma.  There was no evidence of distant metastatic disease.  PDL-1 testing revealed a CPS score of 2 (expressed).  MMR/MSI was intact.  Her2/neu was 1+.  There was not enough tissue for EGFR testing for [facility]   Esophageal cancer, stage IIB ([place])  07/11/2017 Initial Diagnosis   Esophageal cancer, stage IIB (HCC)     Interval history- Aaron Becker, 85 year old male with above history of esophageal cancer, currently receiving radiation, presents to symptom management clinic for complaints of poor oral intake and concerns for dehydration.  Patient is very hard of hearing despite hearing aids and majority of history is obtained from patient's daughter, Aaron Becker. The poor oral intake and dehydration has an ongoing problem for him since his time of diagnosis and he has been seen in  symptom management clinic and in ER  for same.  He has been receiving IV fluids intermittently.  He says today that he continues to lose weight and does not feel hungry.  Food is staying down though.  Today, he ate half a cup of pudding and prefer soft foods such as mashed potatoes, sweet potatoes, and soup.  He does not drink very much; sips of Ensure and Coke throughout the day. He continues to spit up 'slimy' saliva intermittently throughout the day. Denies emesis. Symptoms do not wake him up at night. Has been taking glycopyrrolate and zofran at home with some improvement in symptoms. He is a diabetic and checks his sugars daily at home.  On metformin and glimepiride.  Per family, he restricts his diet due to concerns of elevated blood sugars.    Current treatment plan is for XRT started on 04/16/2019 in 15 fractions. He was felt to not be a candidate for chemotherapy and he has met with palliative care to discuss goals of care. Per chart review and discussions with Billey Chang, NP, he and family declined feeding tube. He and family understand that treatments are palliative and they are interested in goal of comfort and would consider hospice if recommended. He does not currently have any home care agencies providing services.   ECOG FS:2 - Symptomatic, <50% confined to bed  Review of systems- Review of Systems  Constitutional: Positive for malaise/fatigue and weight loss. Negative for chills and fever.  HENT: Positive for hearing loss. Negative for congestion, nosebleeds, sore throat and tinnitus.   Eyes: Negative for blurred vision and double vision.  Respiratory: Negative for cough, hemoptysis, shortness of breath and wheezing.   Cardiovascular: Negative for chest pain, palpitations and leg swelling.  Gastrointestinal: Negative for abdominal pain, blood in stool, constipation, diarrhea, melena, nausea and vomiting.       Spitting up saliva; difficulty swallowing hard/solid foods  Genitourinary: Negative for dysuria and urgency.    Musculoskeletal: Negative for back pain, falls, joint pain and myalgias.  Skin: Negative for itching and rash.  Neurological: Positive for weakness. Negative for dizziness, tingling, sensory change, loss of consciousness and headaches.  Endo/Heme/Allergies: Negative for environmental allergies. Does not bruise/bleed easily.  Psychiatric/Behavioral: Negative for depression. The patient is not nervous/anxious and does not have insomnia.      Current treatment- XRT  Allergies  Allergen Reactions   Flomax [Tamsulosin Hcl] Other (See Comments)    syncope   Ibuprofen Other (See Comments)    Oral blisters at high doses   Rosuvastatin     REACTION: muscle stiffness   Sulfa Antibiotics Rash   Sulfonamide Derivatives Rash    Past Medical History:  Diagnosis Date   Anemia    Arthritis    hands   Bradycardia    Colon polyp    Diabetes mellitus, type II ([place]) 1986   oral meds   Dysphasia    Elevated PSA 2011   per Dr. Jacqlyn Larsen with Uro no problems last 5 or 6 yrs   Esophageal cancer ([place]) dx sept 18 2018   Excessive oral secretions    HOH (hard of hearing)    bilateral AIDES   Hyperlipidemia    Hypertension 02/02   off bp meds since hospitalization 3 weeks ago   Hypothyroidism 1980's   Palpitations    Right rib fracture 2015   SVT (supraventricular tachycardia) ([place])    a. reported SVT during admission at [facility] 06/2014   Syncope and collapse  none recent last few weeks   Wears dentures    upper and lower    Past Surgical History:  Procedure Laterality Date   [place], 09/2015   COMPRESSED FRACTURE SPINE   carotid ultrasound  02/02   wnl   CATARACT EXTRACTION W/PHACO Right 03/19/2016   Procedure: CATARACT EXTRACTION PHACO AND INTRAOCULAR LENS PLACEMENT (IOC);  Surgeon: Ronnell Freshwater, MD;  Location: [facility];  Service: Ophthalmology;  Laterality: Right;  DIABETIC-oral med RIGHT   CATARACT EXTRACTION W/PHACO Left  04/23/2016   Procedure: CATARACT EXTRACTION PHACO AND INTRAOCULAR LENS PLACEMENT (IOC);  Surgeon: Ronnell Freshwater, MD;  Location: [place];  Service: Ophthalmology;  Laterality: Left;  LEFT DIABETIC - oral meds   CHOLECYSTECTOMY  1967   COLONOSCOPY W/ POLYPECTOMY  2012   Villous adenoma from the rectum without high-grade dysplasia, 10 mm   COLONOSCOPY WITH PROPOFOL N/A 03/30/2015   Procedure: COLONOSCOPY WITH PROPOFOL;  Surgeon: Robert Bellow, MD;  Location[facility] ENDOSCOPY;  Service: Endoscopy;  Laterality: N/A;   CYST EXCISION     thumb   CYSTECTOMY  06/14/04   mucous cyst excision with left thumb, IP joint debridement   ESOPHAGOGASTRODUODENOSCOPY (EGD) WITH PROPOFOL N/A 06/11/2017   Procedure: ESOPHAGOGASTRODUODENOSCOPY (EGD) WITH PROPOFOL;  Surgeon: Lucilla Lame, MD;  Location: [facility] ENDOSCOPY;  Service: Endoscopy;  Laterality: N/A;   ESOPHAGOGASTRODUODENOSCOPY (EGD) WITH PROPOFOL N/A 12/31/2017   Procedure: ESOPHAGOGASTRODUODENOSCOPY (EGD) WITH PROPOFOL;  Surgeon: [name], MD;  Location[facility] ENDOSCOPY;  Service: Endoscopy;  Laterality: N/A;   ESOPHAGOGASTRODUODENOSCOPY (EGD) WITH PROPOFOL N/A 05/08/2018   Procedure: ESOPHAGOGASTRODUODENOSCOPY (EGD) WITH PROPOFOL;  Surgeon: [name], MD;  Location: [facility] ENDOSCOPY;  Service: Gastroenterology;  Laterality: N/A;   ESOPHAGOGASTRODUODENOSCOPY (EGD) WITH PROPOFOL N/A 03/20/2019   Procedure: ESOPHAGOGASTRODUODENOSCOPY (EGD) WITH PROPOFOL;  Surgeon: [name], MD;  Location: [facility] ENDOSCOPY;  Service: Gastroenterology;  Laterality: N/A;   EUS N/A 07/04/2017   Procedure: UPPER ENDOSCOPIC ULTRASOUND (EUS) RADIAL;  Surgeon: [name], MD;  Location: [facility] ENDOSCOPY;  Service: Endoscopy;  Laterality: N/A;   EYE SURGERY     FLEXIBLE SIGMOIDOSCOPY N/A 06/11/2017   Procedure: FLEXIBLE SIGMOIDOSCOPY;  Surgeon: Lucilla Lame, MD;  Location: [facility] ENDOSCOPY;  Service: Endoscopy;  Laterality: N/A;   HERNIA REPAIR  2013     KYPHOPLASTY N/A 10/06/2015   Procedure: Thoracic twelve kyphoplasty;  Surgeon: Karie Chimera, MD;  Location: [place] NEURO ORS;  Service: Neurosurgery;  Laterality: N/A;  T12 Kyphoplasty   PROSTATE BIOPSY  11/01/08   Dr. Reece Agar   VARICOSE VEIN SURGERY  1975   VASECTOMY  1975    Social History   Socioeconomic History   Marital status: Widowed    Spouse name: Not on file   Number of children: Not on file   Years of education: Not on file   Highest education level: Not on file  Occupational History   Occupation: Pharmacist, community: retired    Comment: Retired 1999  Scientist, product/process development strain: Not on file   Food insecurity    Worry: Not on file    Inability: Not on Lexicographer needs    Medical: Not on file    Non-medical: Not on file  Tobacco Use   Smoking status: Never Smoker   Smokeless tobacco: Never Used  Substance and Sexual Activity   Alcohol use: No    Alcohol/week: 0.0 standard drinks   Drug use: Never  Sexual activity: Never  Lifestyle   Physical activity    Days per week: Not on file    Minutes per session: Not on file   Stress: Not on file  Relationships   Social connections    Talks on phone: Not on file    Gets together: Not on file    Attends religious service: Not on file    Active member of club or organization: Not on file    Attends meetings of clubs or organizations: Not on file    Relationship status: Not on file   Intimate partner violence    Fear of current or ex partner: Not on file    Emotionally abused: Not on file    Physically abused: Not on file    Forced sexual activity: Not on file  Other Topics Concern   Not on file  Social History Narrative   Mar[place]- widower 2000 ([place]), not marred but with partner since 2001.    Retired from [employer].     [employer] '[employer], stationed in [place]    Family History  Problem Relation Age of Onset   Cancer Father        liver    Cancer Sister        breast   Cancer Brother        skin   Cancer Sister        breast   Heart disease Sister        pacer   Heart disease Sister    Cancer Sister        breast cancer   Heart disease Sister    Colon cancer Neg Hx    Prostate cancer Neg Hx      Current Outpatient Medications:    acetaminophen (TYLENOL) 500 MG tablet, Take 500-1,000 mg by mouth every 6 (six) hours as needed (for pain.)., Disp: , Rfl:    Ascorbic Acid (VITAMIN C) 1000 MG tablet, Take 1,000 mg by mouth daily., Disp: , Rfl:    atropine 1 % ophthalmic solution, Place 2 drops under the tongue 3 (three) times daily., Disp: 2 mL, Rfl: 12   benzonatate (TESSALON) 100 MG capsule, Take 100 mg by mouth at bedtime as needed for cough., Disp: , Rfl:    Blood Glucose Monitoring Suppl (ONE TOUCH ULTRA 2) w/Device KIT, Use once to twice daily as needed Dx E11.9 (Patient not taking: Reported on 03/20/2019), Disp: 1 each, Rfl: 0   diphenhydrAMINE (BENADRYL) 25 mg capsule, Take 25 mg by mouth at bedtime as needed (for sleep.). , Disp: , Rfl:    docusate sodium (COLACE) 100 MG capsule, Take 100 mg by mouth 2 (two) times daily as needed (for constipation)., Disp: , Rfl:    furosemide (LASIX) 20 MG tablet, Take 1-2 tablets (20-40 mg total) by mouth daily as needed for fluid., Disp: , Rfl:    glimepiride (AMARYL) 1 MG tablet, TAKE 2 TABLETS(2 MG) BY MOUTH DAILY WITH BREAKFAST, Disp: 180 tablet, Rfl: 3   glucose blood (ONE TOUCH ULTRA TEST) test strip, TO TEST BLOOD SUGAR ONCE OR TWICE DAILY AND AS DIRECTED DX E11.9.  H/o variable blood sugars., Disp: 200 each, Rfl: 1   Glycopyrrolate 1 MG/5ML SOLN, Take 5 mLs (1 mg total) by mouth every morning., Disp: 1350 mL, Rfl: 0   levothyroxine (SYNTHROID) 75 MCG tablet, TAKE 1 TABLET(75 MCG) BY MOUTH DAILY, Disp: 90 tablet, Rfl: 1   metFORMIN (GLUCOPHAGE) 500 MG tablet, TAKE 1 TABLET BY MOUTH EVERY MORNING  AND 2 TABLETS BY MOUTH AT SUPPER AND 1 TABLET AT NIGHT IF  NEEDED, Disp: 360 tablet, Rfl: 0   Multiple Vitamin (MULTIVITAMIN WITH MINERALS) TABS tablet, Take 1 tablet by mouth daily. Centrum Silver, Disp: , Rfl:    ondansetron (ZOFRAN ODT) 4 MG disintegrating tablet, Take 1 tablet (4 mg total) by mouth every 8 (eight) hours as needed for nausea or vomiting., Disp: 90 tablet, Rfl: 3   ondansetron (ZOFRAN) 4 MG tablet, Take 4 mg by mouth every 8 (eight) hours as needed for nausea or vomiting., Disp: , Rfl:    ondansetron (ZOFRAN) 4 MG/5ML solution, Take 10 mLs (8 mg total) by mouth every 8 hours as needed for nausea., Disp: 150 mL, Rfl: 0   pantoprazole (PROTONIX) 40 MG tablet, Take 1 tablet (40 mg total) by mouth 2 (two) times daily., Disp: 180 tablet, Rfl: 3   pravastatin (PRAVACHOL) 80 MG tablet, TAKE 1 TABLET(80 MG) BY MOUTH AT BEDTIME, Disp: 90 tablet, Rfl: 3   prochlorperazine (COMPAZINE) 10 MG tablet, Take 1 tablet (10 mg total) by mouth every 6 (six) hours as needed for nausea or vomiting., Disp: 30 tablet, Rfl: 0   pyridOXINE (VITAMIN B-6) 100 MG tablet, Take 100 mg by mouth 3 (three) times a week. , Disp: , Rfl:  No current facility-administered medications for this visit.   Facility-Administered Medications Ordered in Other Visits:    0.9 %  sodium chloride infusion, , Intravenous, Once, Verlon Au, NP   dexamethasone (DECADRON) injection 10 mg, 10 mg, Intravenous, Once, Verlon Au, NP  Physical exam:  Vitals:   04/21/19 1214  BP: (!) 155/72  Pulse: 76  Resp: 18  Temp: (!) 97.2 F (36.2 C)  TempSrc: Tympanic  Weight: 125 lb (56.7 kg)   Physical Exam Constitutional:      General: He is not in acute distress.    Comments: Frail, elderly, thin built in wheelchair; no acute distress  HENT:     Head: Normocephalic.     Comments: Hard of hearing despite hearing aids    Mouth/Throat:     Mouth: Mucous membranes are dry.     Pharynx: Oropharynx is clear. No posterior oropharyngeal erythema.  Eyes:     General: No  scleral icterus.    Conjunctiva/sclera: Conjunctivae normal.  Neck:     Musculoskeletal: Neck supple. No muscular tenderness.  Cardiovascular:     Rate and Rhythm: Normal rate and regular rhythm.     Pulses: Normal pulses.     Heart sounds: Normal heart sounds.  Pulmonary:     Effort: Pulmonary effort is normal.  Abdominal:     General: There is no distension.     Palpations: Abdomen is soft.     Tenderness: There is no abdominal tenderness.  Musculoskeletal:        General: No deformity or signs of injury.  Lymphadenopathy:     Cervical: No cervical adenopathy.  Skin:    General: Skin is warm and dry.  Neurological:     Mental Status: He is oriented to person, place, and time. Mental status is at baseline.  Psychiatric:        Mood and Affect: Mood normal.        Behavior: Behavior normal.      CMP Latest Ref Rng & Units 04/21/2019  Glucose 70 - 99 mg/dL 168(H)  BUN 8 - 23 mg/dL 31(H)  Creatinine 0.61 - 1.24 mg/dL 1.50(H)  Sodium 135 - 145 mmol/L 139  Potassium 3.5 - 5.1 mmol/L 3.2(L)  Chloride 98 - 111 mmol/L 100  CO2 22 - 32 mmol/L 26  Calcium 8.9 - 10.3 mg/dL 9.2  Total Protein 6.5 - 8.1 g/dL 6.8  Total Bilirubin 0.3 - 1.2 mg/dL 0.9  Alkaline Phos 38 - 126 U/L 64  AST 15 - 41 U/L 20  ALT 0 - 44 U/L 19   CBC Latest Ref Rng & Units 04/21/2019  WBC 4.0 - 10.5 K/uL 11.5(H)  Hemoglobin 13.0 - 17.0 g/dL 11.6(L)  Hematocrit 39.0 - 52.0 % 35.2(L)  Platelets 150 - 400 K/uL 180    No images are attached to the encounter.  Nm Pet Image Restag (ps) Skull Base To Thigh  Result Date: 04/03/2019 CLINICAL DATA:  Subsequent treatment strategy for esophageal carcinoma. EXAM: NUCLEAR MEDICINE PET SKULL BASE TO THIGH TECHNIQUE: 7.12 mCi F-18 FDG was injected intravenously. Full-ring PET imaging was performed from the skull base to thigh after the radiotracer. CT data was obtained and used for attenuation correction and anatomic localization. Fasting blood glucose: 98 mg/dl  COMPARISON:  PET-CT 05/20/2018. FINDINGS: Mediastinal blood pool activity: SUV max 1.89 Liver activity: SUV max NA NECK: No hypermetabolic lymph nodes in the neck. Incidental CT findings: none CHEST: No FDG avid axillary, supraclavicular, mediastinal, or hilar lymph nodes. No suspicious pulmonary nodules. Corresponding to the increased soft tissue on recent chest CT from 03/02/2019 there is FDG avid tumor involving the distal esophagus and proximal stomach. This extends across the GE junction measuring approximately 6.9 cm in length with an SUV max of 6.34. Incidental CT findings: Aortic atherosclerosis. Calcification in the left main, lad and RCA coronary arteries noted. ABDOMEN/PELVIS: There is no abnormal uptake within the liver, pancreas, spleen, or adrenal glands. No hypermetabolic lymph nodes within the abdomen or pelvis. Incidental CT findings: Aortic atherosclerosis. No aneurysm. Prostate gland enlargement. SKELETON: No focal hypermetabolic activity to suggest skeletal metastasis. Chronic compression deformity is noted involving T12, status post kyphoplasty. Incidental CT findings: none IMPRESSION: 1. Distal esophageal and proximal gastric lesion is FDG avid within SUV max of 6.34. 2. No findings to suggest FDG avid nodal metastasis or distant metastatic disease. 3. Aortic Atherosclerosis (ICD10-I70.0). Coronary artery calcifications. Electronically Signed   By: Kerby Moors M.D.   On: 04/03/2019 15:37    Assessment and plan- Patient is a 85 y.o. male diagnosed with distal esophageal stage adenocarcinoma, stage IIb currently receiving salvage radiation therapy who presents to Symptom Management clinic for complaints of dehydration and poor oral intake.   1. 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 Imaging  showed thickening at GE junction and confirmed on barium swallow. Upper endoscopy showed luminal narrowing with persistent tumor; hypermetabolic on pet. Due to frailty, he was felt not to be a candidate for chemotherapy and initiated salvage radiation on 04/16/2019 with goal of 2700 cGy in 15 fractions.   2. Dehydration- labs reviewed; cr 1.5- baseline around 1.2. BUN 31. IV fluids in clinic today - 1L over 2 hours. Encouraged to increase oral intake as tolerated. Appears that swallowing is tolerating as he is managing secretions well currently and able to tolerate oral liquids in clinic.   3. Emesis- etiology unclear- symptoms not seen in clinic. He describes more saliva like symptoms as opposed to gastric contents/bile. I question if some of the 'spitting up' is secondary  to radiation vs gastritis vs others. Will give decadron 10 mg IV in clinic. Start sucralfate (dissolve & swallow) 1 g tab in 10 ml of water (approx 15 minutes)  Four times a day with meals and at bedtime.   4. Malnutrition/Weakness & poor oral intake-  Encouraged high protein and calorie foods, small frequent meals and comfort feedings as tolerated.   5. Hypokalemia- potassium 3.2 today. Start potassium 14mq daily- due to difficulty swallowing may need to make into slurry. Encouraged increased intake of potassium rich foods. Consider iv supplementation if refractory.   Plan of care discussed with Dr. RJanese Bankswho was in agreement. I updated patient's daughter throughout visit due to covid 19 visitor precautions.   RTC in 2 days for labs & re-evaluation with JRulon Abide NP   Visit Diagnosis 1. Malignant ne[place] of esophagus, unspecified location ([place]   2. Dehydration   3. Severe protein-calorie malnutrition ([place]   4. Hypokalemia     Patient expressed understanding and was in agreement with this plan. He also understands that He can call clinic at any time with any questions, concerns, or complaints.   Thank you for allowing  me to participate in the care of this very pleasant patient.   LBeckey Rutter DNP, AGNP-C CWhite [facility]ph[facility] Springsat [phone](work cell) [phone](office)

## 2019-04-21 NOTE — Telephone Encounter (Signed)
I think the canceling the follow-up appointments here makes sense in the meantime, so he can try to get through the radiation appointments.  Please update me as needed.  Thanks.

## 2019-04-22 ENCOUNTER — Telehealth: Payer: Self-pay | Admitting: Nurse Practitioner

## 2019-04-22 ENCOUNTER — Other Ambulatory Visit: Payer: Self-pay | Admitting: *Deleted

## 2019-04-22 ENCOUNTER — Other Ambulatory Visit: Payer: Self-pay

## 2019-04-22 ENCOUNTER — Ambulatory Visit
Admission: RE | Admit: 2019-04-22 | Discharge: 2019-04-22 | Disposition: A | Discharge status: Home / Self Care | Payer: Medicare Other | Source: Ambulatory Visit | Attending: Radiation Oncology | Admitting: Radiation Oncology

## 2019-04-22 DIAGNOSIS — Z510 Encounter for antineoplastic radiation therapy: Secondary | ICD-10-CM | POA: Diagnosis not present

## 2019-04-22 DIAGNOSIS — C155 Malignant neoplasm of lower third of esophagus: Secondary | ICD-10-CM | POA: Diagnosis not present

## 2019-04-22 DIAGNOSIS — R531 Weakness: Secondary | ICD-10-CM

## 2019-04-22 DIAGNOSIS — E860 Dehydration: Secondary | ICD-10-CM

## 2019-04-22 DIAGNOSIS — C159 Malignant neoplasm of esophagus, unspecified: Secondary | ICD-10-CM

## 2019-04-22 NOTE — Telephone Encounter (Signed)
Spoke with Leodis Sias (signnificant other) regarding Palliative services and scheduling the Consult.  She requested that I contact patient's daughter [name] to set this up.  She gave me her contact number.  Will f/u with her.

## 2019-04-22 NOTE — Telephone Encounter (Signed)
Spoke with daughter Rodena Piety regarding Palliative services and she was in agreement with this.  I have scheduled a Telephone Palliative Consult for 05/11/19 @ 2 PM.

## 2019-04-23 ENCOUNTER — Ambulatory Visit
Admission: RE | Admit: 2019-04-23 | Discharge: 2019-04-23 | Disposition: A | Discharge status: Home / Self Care | Payer: Medicare Other | Source: Ambulatory Visit | Attending: Radiation Oncology | Admitting: Radiation Oncology

## 2019-04-23 ENCOUNTER — Inpatient Hospital Stay: Payer: Medicare Other

## 2019-04-23 ENCOUNTER — Other Ambulatory Visit: Payer: Self-pay | Admitting: *Deleted

## 2019-04-23 ENCOUNTER — Inpatient Hospital Stay (HOSPITAL_BASED_OUTPATIENT_CLINIC_OR_DEPARTMENT_OTHER): Payer: Medicare Other | Admitting: Oncology

## 2019-04-23 ENCOUNTER — Other Ambulatory Visit: Payer: Self-pay

## 2019-04-23 VITALS — BP 153/77 | HR 78 | Temp 98.5°F | Resp 18

## 2019-04-23 DIAGNOSIS — Z923 Personal history of irradiation: Secondary | ICD-10-CM | POA: Diagnosis not present

## 2019-04-23 DIAGNOSIS — I2510 Atherosclerotic heart disease of native coronary artery without angina pectoris: Secondary | ICD-10-CM | POA: Diagnosis not present

## 2019-04-23 DIAGNOSIS — Z510 Encounter for antineoplastic radiation therapy: Secondary | ICD-10-CM | POA: Diagnosis not present

## 2019-04-23 DIAGNOSIS — R630 Anorexia: Secondary | ICD-10-CM | POA: Diagnosis not present

## 2019-04-23 DIAGNOSIS — E860 Dehydration: Secondary | ICD-10-CM

## 2019-04-23 DIAGNOSIS — E876 Hypokalemia: Secondary | ICD-10-CM

## 2019-04-23 DIAGNOSIS — C155 Malignant neoplasm of lower third of esophagus: Secondary | ICD-10-CM | POA: Diagnosis not present

## 2019-04-23 DIAGNOSIS — C159 Malignant neoplasm of esophagus, unspecified: Secondary | ICD-10-CM

## 2019-04-23 DIAGNOSIS — R5381 Other malaise: Secondary | ICD-10-CM | POA: Diagnosis not present

## 2019-04-23 DIAGNOSIS — R112 Nausea with vomiting, unspecified: Secondary | ICD-10-CM

## 2019-04-23 DIAGNOSIS — R5383 Other fatigue: Secondary | ICD-10-CM | POA: Diagnosis not present

## 2019-04-23 DIAGNOSIS — Z9221 Personal history of antineoplastic chemotherapy: Secondary | ICD-10-CM | POA: Diagnosis not present

## 2019-04-23 DIAGNOSIS — R531 Weakness: Secondary | ICD-10-CM

## 2019-04-23 DIAGNOSIS — E785 Hyperlipidemia, unspecified: Secondary | ICD-10-CM | POA: Diagnosis not present

## 2019-04-23 DIAGNOSIS — C169 Malignant neoplasm of stomach, unspecified: Secondary | ICD-10-CM | POA: Diagnosis not present

## 2019-04-23 DIAGNOSIS — E119 Type 2 diabetes mellitus without complications: Secondary | ICD-10-CM | POA: Diagnosis not present

## 2019-04-23 DIAGNOSIS — R634 Abnormal weight loss: Secondary | ICD-10-CM | POA: Diagnosis not present

## 2019-04-23 DIAGNOSIS — Z515 Encounter for palliative care: Secondary | ICD-10-CM | POA: Diagnosis not present

## 2019-04-23 DIAGNOSIS — Z8719 Personal history of other diseases of the digestive system: Secondary | ICD-10-CM | POA: Diagnosis not present

## 2019-04-23 DIAGNOSIS — I10 Essential (primary) hypertension: Secondary | ICD-10-CM | POA: Diagnosis not present

## 2019-04-23 DIAGNOSIS — R1319 Other dysphagia: Secondary | ICD-10-CM | POA: Diagnosis not present

## 2019-04-23 LAB — BASIC METABOLIC PANEL
Anion gap: 12 (ref 5–15)
BUN: 31 mg/dL — ABNORMAL HIGH (ref 8–23)
CO2: 28 mmol/L (ref 22–32)
Calcium: 9.1 mg/dL (ref 8.9–10.3)
Chloride: 102 mmol/L (ref 98–111)
Creatinine, Ser: 1.45 mg/dL — ABNORMAL HIGH (ref 0.61–1.24)
GFR calc Af Amer: 50 mL/min — ABNORMAL LOW (ref >60)
GFR calc non Af Amer: 43 mL/min — ABNORMAL LOW (ref >60)
Glucose, Bld: 177 mg/dL — ABNORMAL HIGH (ref 70–99)
Potassium: 2.9 mmol/L — ABNORMAL LOW (ref 3.5–5.1)
Sodium: 142 mmol/L (ref 135–145)

## 2019-04-23 MED ORDER — POTASSIUM CHLORIDE 20 MEQ PO PACK: 20.0000 meq | PACK | Freq: Two times a day (BID) | ORAL | 0 refills | Status: AC

## 2019-04-23 MED ORDER — SODIUM CHLORIDE 0.9 % IV SOLN
30.0000 meq | Freq: Once | INTRAVENOUS | Status: AC
  Administered 2019-04-23: 30 meq via INTRAVENOUS
  Filled 2019-04-23: qty 15

## 2019-04-23 MED ORDER — SUCRALFATE 1 GM/10ML PO SUSP: 1.0000 g | Freq: Three times a day (TID) | ORAL | 0 refills | Status: DC

## 2019-04-23 MED ORDER — ONDANSETRON HCL 4 MG/2ML IJ SOLN
8.0000 mg | Freq: Once | INTRAMUSCULAR | Status: AC
  Administered 2019-04-23: 8 mg via INTRAVENOUS
  Filled 2019-04-23: qty 4

## 2019-04-23 MED ORDER — DEXAMETHASONE SODIUM PHOSPHATE 10 MG/ML IJ SOLN
10.0000 mg | Freq: Once | INTRAMUSCULAR | Status: AC
  Administered 2019-04-23: 10 mg via INTRAVENOUS
  Filled 2019-04-23: qty 1

## 2019-04-23 NOTE — Progress Notes (Signed)
Symptom Management Consult note [place]  [phone] [phone]  Patient Care Team: Tonia Ghent, MD as PCP - General (Family Medicine) Bary Castilla, Forest Gleason, MD (General Surgery) Modesto Charon, MD (Family Medicine) Lucilla Lame, MD as Consulting Physician (Gastroenterology) Lequita Asal, MD as Referring Physician (Hematology and Oncology) Clent Jacks, RN as Registered Nurse Noreene Filbert, MD as Referring Physician (Radiation Oncology) Sindy Guadeloupe, MD as Consulting Physician (Oncology)   Name of the patient: Aaron Becker  [id number]  Aug 24, 1930   Date of visit: 04/23/2019   Diagnosis-esophageal cancer  Chief complaint/ Reason for visit-dehydration/weakness  Heme/Onc history:  Oncology History Overview Note  Aaron Becker is a 85 y.o. male with clinical stage IIB esophageal cancer (uT2N0).  He presented with an upper GI bleed.  EGD on 06/11/2017 revealed one cratered esophageal ulcer and stigmata of recent esophogeal bleeding at the GE junction.  Biopsy revealed poorly differentiated adenocarcinoma, signet ring type.  There was one non-bleeding cratered gastric ulcer with no stigmata of bleeding in the gastric antrum.  Duodenum was normal.  Colonoscopy on 03/30/2015 revealed a 20 mm tubulovillous adenoma of the rectum.  Flexible sigmoidoscopy on 06/01/2017 revealed a sessile polyp in the rectum and grade II non-bleeding internal hemorrhoids.  PET scan  on 06/27/2017 that revealed no appreciable abnormal esophageal hypermetabolic activity to correspond with the reported tumor. There was no adenopathy or other findings of metastatic disease. There was question that the original tumor is either low-grade or very small.  Upper endoscopic ultrasound (EUS) on 07/04/2017 revealed a 1-2cm ulcerated mass at the GE junction that was partially circumferential and nonobstructive. The mass is 1 cm in thickness and  occupies 3/4 of the  lumen circumference at the GE junction. The mass clearly passes into, but not through the muscularis propria layer of the esophageal wall. There was no paraesophageal adenopathy. Clinical stage was uT2N0 GE junction adenocarcinoma.  He received 5 weeks of carboplatin and Taxol with radiation (07/29/2017 - 09/03/2017).  Radiation completed on Dec 31, 202018.  PET scan on 12/04/2017 revealed a small focus of mild uptake (SUV 3.77; background 2.73) localizing to the caudate lobe of liver. There was no corresponding CT abnormality.  Upper endoscopy on 12/31/2017 revealed erythematous mucosa in the esophagus.  Biopsy revealed no malignancy.   EGD on 05/08/2018 revealed a normal esophagus and congested mucosa in the cardia.  GE junction biopsy revealed squamocolumnar mucosa and columnar-lined mucosa with mild chronic inflammation, negative for goblet cells, dysplasia and malignancy. 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  PET scan on 05/20/2018 revealed no FDG evidence of esophageal carcinoma.  There was no evidence of distant metastatic disease.  PDL-1 testing revealed a CPS score of 2 (expressed).  MMR/MSI was intact.  Her2/neu was 1+.  There was not enough tissue for EGFR testing for [facility]   Esophageal cancer, stage IIB ([place])  07/11/2017 Initial Diagnosis   Esophageal cancer, stage IIB (HCC)    Interval history-Aaron Becker is an 85 year old male with above history of esophageal cancer currently receiving radiation who presents to [facility] for complaints of dehydration and regurgitation.  Symptoms have been present for several weeks now.  He has received infusions in the past for similar complaints.  He  has started radiation without any significant change to his swallowing or secretions.  Patient significant other, Manus Gunning states that Mr. Oregel gets strangled and coughs up thick clear mucus throughout  the day.  It is  unrelated to food or liquid intake.  Previously prescribed atropine and glycopyrrolate that helps some.  He is also taking Zofran for nausea.  He continues to lose weight and not feel hungry.  Food is staying down.  He ate half a cup of pudding prior to coming here today.  Previously discussed placing a PEG tube, but patient and family have declined at this time.  Current plan is radiation; 15 fractions.  This started on 04/16/2019.  Chemotherapy was not an option given age and performance status.  Palliative care was introduced and they have met with Josh Borders to address goals of care.   He denies any recent fevers or illnesses, easy bleeding or bruising, chest pain, constipation or diarrhea.  ECOG FS:2 - Symptomatic, <50% confined to bed  Review of systems- Review of Systems  Constitutional: Positive for malaise/fatigue and weight loss. Negative for chills and fever.  HENT: Negative for congestion, ear pain and tinnitus.   Eyes: Negative.  Negative for blurred vision and double vision.  Respiratory: Positive for cough and sputum production. Negative for shortness of breath.   Cardiovascular: Negative.  Negative for chest pain, palpitations and leg swelling.  Gastrointestinal: Positive for nausea. Negative for abdominal pain, constipation, diarrhea and vomiting.       Regurgitation and spitting up clear (slime).  Genitourinary: Negative for dysuria, frequency and urgency.  Musculoskeletal: Negative for back pain and falls.  Skin: Negative.  Negative for rash.  Neurological: Positive for weakness. Negative for headaches.  Endo/Heme/Allergies: Negative.  Does not bruise/bleed easily.  Psychiatric/Behavioral: Negative.  Negative for depression. The patient is not nervous/anxious and does not have insomnia.      Current treatment- s/p 6 radiation tx.   Allergies  Allergen Reactions   Flomax [Tamsulosin Hcl] Other (See Comments)    syncope   Ibuprofen Other (See Comments)    Oral  blisters at high doses   Rosuvastatin     REACTION: muscle stiffness   Sulfa Antibiotics Rash   Sulfonamide Derivatives Rash     Past Medical History:  Diagnosis Date   Anemia    Arthritis    hands   Bradycardia    Colon polyp    Diabetes mellitus, type II (HCC) 1986   oral meds   Dysphasia    Elevated PSA 2011   per Dr. Jacqlyn Larsen with Uro no problems last 5 or 6 yrs   Esophageal cancer ([place]) dx sept 18 2018   Excessive oral secretions    HOH (hard of hearing)    bilateral AIDES   Hyperlipidemia    Hypertension 02/02   off bp meds since hospitalization 3 weeks ago   Hypothyroidism 1980's   Palpitations    Right rib fracture 2015   SVT (supraventricular tachycardia) ([place])    a. reported SVT during admission at [facility] 06/2014   Syncope and collapse    none recent last few weeks   Wears dentures    upper and lower     Past Surgical History:  Procedure Laterality Date   [place], 09/2015   COMPRESSED FRACTURE SPINE   carotid ultrasound  02/02   wnl   CATARACT EXTRACTION W/PHACO Right 03/19/2016   Procedure: CATARACT EXTRACTION PHACO AND INTRAOCULAR LENS PLACEMENT ([place]);  Surgeon: Ronnell Freshwater, MD;  Location: [facility];  Service: Ophthalmology;  Laterality: Right;  DIABETIC-oral med RIGHT   CATARACT EXTRACTION W/PHACO Left 04/23/2016   Procedure: CATARACT EXTRACTION PHACO  AND INTRAOCULAR LENS PLACEMENT ([facility]);  Surgeon: Ronnell Freshwater, MD;  Location: [facility];  Service: Ophthalmology;  Laterality: Left;  LEFT DIABETIC - oral meds   CHOLECYSTECTOMY  1967   COLONOSCOPY W/ POLYPECTOMY  2012   Villous adenoma from the rectum without high-grade dysplasia, 10 mm   COLONOSCOPY WITH PROPOFOL N/A 03/30/2015   Procedure: COLONOSCOPY WITH PROPOFOL;  Surgeon: Robert Bellow, MD;  Location: [facility] ENDOSCOPY;  Service: Endoscopy;  Laterality: N/A;   CYST EXCISION     thumb   CYSTECTOMY  06/14/04   mucous  cyst excision with left thumb, IP joint debridement   ESOPHAGOGASTRODUODENOSCOPY (EGD) WITH PROPOFOL N/A 06/11/2017   Procedure: ESOPHAGOGASTRODUODENOSCOPY (EGD) WITH PROPOFOL;  Surgeon: Lucilla Lame, MD;  Location: [facility] ENDOSCOPY;  Service: Endoscopy;  Laterality: N/A;   ESOPHAGOGASTRODUODENOSCOPY (EGD) WITH PROPOFOL N/A 12/31/2017   Procedure: ESOPHAGOGASTRODUODENOSCOPY (EGD) WITH PROPOFOL;  Surgeon: [name], MD;  Location: [facility] ENDOSCOPY;  Service: Endoscopy;  Laterality: N/A;   ESOPHAGOGASTRODUODENOSCOPY (EGD) WITH PROPOFOL N/A 05/08/2018   Procedure: ESOPHAGOGASTRODUODENOSCOPY (EGD) WITH PROPOFOL;  Surgeon: [name], MD;  Location: [facility] ENDOSCOPY;  Service: Gastroenterology;  Laterality: N/A;   ESOPHAGOGASTRODUODENOSCOPY (EGD) WITH PROPOFOL N/A 03/20/2019   Procedure: ESOPHAGOGASTRODUODENOSCOPY (EGD) WITH PROPOFOL;  Surgeon: [name], MD;  [facility]COPY;  Service: Gastroenterology;  Laterality: N/A;   EUS N/A 07/04/2017   Procedure: UPPER ENDOSCOPIC ULTRASOUND (EUS) RADIAL;  Surgeon: [name], MD;  Location: [facility] ENDOSCOPY;  Service: Endoscopy;  Laterality: N/A;   EYE SURGERY     FLEXIBLE SIGMOIDOSCOPY N/A 06/11/2017   Procedure: FLEXIBLE SIGMOIDOSCOPY;  Surgeon: Lucilla Lame, MD;  Location: [facility] ENDOSCOPY;  Service: Endoscopy;  Laterality: N/A;   HERNIA REPAIR  2013   KYPHOPLASTY N/A 10/06/2015   Procedure: Thoracic twelve kyphoplasty;  Surgeon: Karie Chimera, MD;  Location: [place] NEURO ORS;  Service: Neurosurgery;  Laterality: N/A;  T12 Kyphoplasty   PROSTATE BIOPSY  11/01/08   Dr. Reece Agar   VARICOSE VEIN SURGERY  1975   VASECTOMY  1975    Social History   Socioeconomic History   Marital status: Widowed    Spouse name: Not on file   Number of children: Not on file   Years of education: Not on file   Highest education level: Not on file  Occupational History   Occupation: Pharmacist, community: retired    Comment:  Retired 1999  Scientist, product/process development strain: Not on file   Food insecurity    Worry: Not on file    Inability: Not on Lexicographer needs    Medical: Not on file    Non-medical: Not on file  Tobacco Use   Smoking status: Never Smoker   Smokeless tobacco: Never Used  Substance and Sexual Activity   Alcohol use: No    Alcohol/week: 0.0 standard drinks   Drug use: Never   Sexual activity: Never  Lifestyle   Physical activity    Days per week: Not on file    Minutes per session: Not on file   Stress: Not on file  Relationships   Social connections    Talks on phone: Not on file    Gets together: Not on file    Attends religious service: Not on file    Active member of club or organization: Not on file    Attends meetings of clubs or organizations: Not on file    Relationship status: Not on file   Intimate  partner violence    Fear of current or ex partner: Not on file    Emotionally abused: Not on file    Physically abused: Not on file    Forced sexual activity: Not on file  Other Topics Concern   Not on file  Social History Narrative   Mar[place]- widower 2000 ([place]), not marred but with partner since 2001.    Retired from [employer].     [employer] '[employer], stationed in [place]    Family History  Problem Relation Age of Onset   Cancer Father        liver   Cancer Sister        breast   Cancer Brother        skin   Cancer Sister        breast   Heart disease Sister        pacer   Heart disease Sister    Cancer Sister        breast cancer   Heart disease Sister    Colon cancer Neg Hx    Prostate cancer Neg Hx      Current Outpatient Medications:    furosemide (LASIX) 20 MG tablet, Take 1-2 tablets (20-40 mg total) by mouth daily as needed for fluid., Disp: , Rfl:    glimepiride (AMARYL) 1 MG tablet, TAKE 2 TABLETS(2 MG) BY MOUTH DAILY WITH BREAKFAST, Disp: 180 tablet, Rfl: 3   glucose blood (ONE TOUCH ULTRA TEST) test  strip, TO TEST BLOOD SUGAR ONCE OR TWICE DAILY AND AS DIRECTED DX E11.9.  H/o variable blood sugars., Disp: 200 each, Rfl: 1   Glycopyrrolate 1 MG/5ML SOLN, Take 5 mLs (1 mg total) by mouth every morning., Disp: 1350 mL, Rfl: 0   levothyroxine (SYNTHROID) 75 MCG tablet, TAKE 1 TABLET(75 MCG) BY MOUTH DAILY, Disp: 90 tablet, Rfl: 1   metFORMIN (GLUCOPHAGE) 500 MG tablet, TAKE 1 TABLET BY MOUTH EVERY MORNING AND 2 TABLETS BY MOUTH AT SUPPER AND 1 TABLET AT NIGHT IF NEEDED, Disp: 360 tablet, Rfl: 0   ondansetron (ZOFRAN) 4 MG/5ML solution, Take 10 mLs (8 mg total) by mouth every 8 hours as needed for nausea., Disp: 150 mL, Rfl: 0   pantoprazole (PROTONIX) 40 MG tablet, Take 1 tablet (40 mg total) by mouth 2 (two) times daily., Disp: 180 tablet, Rfl: 3   pravastatin (PRAVACHOL) 80 MG tablet, TAKE 1 TABLET(80 MG) BY MOUTH AT BEDTIME, Disp: 90 tablet, Rfl: 3   acetaminophen (TYLENOL) 500 MG tablet, Take 500-1,000 mg by mouth every 6 (six) hours as needed (for pain.)., Disp: , Rfl:    Ascorbic Acid (VITAMIN C) 1000 MG tablet, Take 1,000 mg by mouth daily., Disp: , Rfl:    Blood Glucose Monitoring Suppl (ONE TOUCH ULTRA 2) w/Device KIT, Use once to twice daily as needed Dx E11.9 (Patient not taking: Reported on 03/20/2019), Disp: 1 each, Rfl: 0   potassium chloride (KLOR-CON) 20 MEQ packet, Take 20 mEq by mouth 2 (two) times daily., Disp: 14 packet, Rfl: 0   sucralfate (CARAFATE) 1 GM/10ML suspension, Take 10 mLs (1 g total) by mouth 4 (four) times daily -  with meals and at bedtime., Disp: 420 mL, Rfl: 0  Physical exam:  Vitals:   04/23/19 1421  BP: (!) 153/77  Pulse: 78  Resp: 18  Temp: 98.5 F (36.9 C)  TempSrc: Tympanic   Physical Exam Vitals reviewed: frail   Constitutional:      Appearance: Normal appearance.  HENT:  Head: Normocephalic and atraumatic.  Eyes:     Pupils: Pupils are equal, round, and reactive to light.  Neck:     Musculoskeletal: Normal range of motion.    Cardiovascular:     Rate and Rhythm: Normal rate and regular rhythm.     Heart sounds: Normal heart sounds. No murmur.  Pulmonary:     Effort: Pulmonary effort is normal.     Breath sounds: Normal breath sounds. No wheezing.  Abdominal:     General: Bowel sounds are normal. There is no distension.     Palpations: Abdomen is soft.     Tenderness: There is no abdominal tenderness.  Musculoskeletal: Normal range of motion.  Skin:    General: Skin is warm and dry.     Coloration: Skin is pale.     Findings: No rash.  Neurological:     Mental Status: He is alert and oriented to person, place, and time.  Psychiatric:        Judgment: Judgment normal.      CMP Latest Ref Rng & Units 04/23/2019  Glucose 70 - 99 mg/dL 177(H)  BUN 8 - 23 mg/dL 31(H)  Creatinine 0.61 - 1.24 mg/dL 1.45(H)  Sodium 135 - 145 mmol/L 142  Potassium 3.5 - 5.1 mmol/L 2.9(L)  Chloride 98 - 111 mmol/L 102  CO2 22 - 32 mmol/L 28  Calcium 8.9 - 10.3 mg/dL 9.1  Total Protein 6.5 - 8.1 g/dL -  Total Bilirubin 0.3 - 1.2 mg/dL -  Alkaline Phos 38 - 126 U/L -  AST 15 - 41 U/L -  ALT 0 - 44 U/L -   CBC Latest Ref Rng & Units 04/21/2019  WBC 4.0 - 10.5 K/uL 11.5(H)  Hemoglobin 13.0 - 17.0 g/dL 11.6(L)  Hematocrit 39.0 - 52.0 % 35.2(L)  Platelets 150 - 400 K/uL 180    No images are attached to the encounter.  Nm Pet Image Restag (ps) Skull Base To Thigh  Result Date: 04/03/2019 CLINICAL DATA:  Subsequent treatment strategy for esophageal carcinoma. EXAM: NUCLEAR MEDICINE PET SKULL BASE TO THIGH TECHNIQUE: 7.12 mCi F-18 FDG was injected intravenously. Full-ring PET imaging was performed from the skull base to thigh after the radiotracer. CT data was obtained and used for attenuation correction and anatomic localization. Fasting blood glucose: 98 mg/dl COMPARISON:  PET-CT 05/20/2018. FINDINGS: Mediastinal blood pool activity: SUV max 1.89 Liver activity: SUV max NA NECK: No hypermetabolic lymph nodes in the neck.  Incidental CT findings: none CHEST: No FDG avid axillary, supraclavicular, mediastinal, or hilar lymph nodes. No suspicious pulmonary nodules. Corresponding to the increased soft tissue on recent chest CT from 03/02/2019 there is FDG avid tumor involving the distal esophagus and proximal stomach. This extends across the GE junction measuring approximately 6.9 cm in length with an SUV max of 6.34. Incidental CT findings: Aortic atherosclerosis. Calcification in the left main, lad and RCA coronary arteries noted. ABDOMEN/PELVIS: There is no abnormal uptake within the liver, pancreas, spleen, or adrenal glands. No hypermetabolic lymph nodes within the abdomen or pelvis. Incidental CT findings: Aortic atherosclerosis. No aneurysm. Prostate gland enlargement. SKELETON: No focal hypermetabolic activity to suggest skeletal metastasis. Chronic compression deformity is noted involving T12, status post kyphoplasty. Incidental CT findings: none IMPRESSION: 1. Distal esophageal and proximal gastric lesion is FDG avid within SUV max of 6.34. 2. No findings to suggest FDG avid nodal metastasis or distant metastatic disease. 3. Aortic Atherosclerosis (ICD10-I70.0). Coronary artery calcifications. Electronically Signed   By: Kerby Moors  M.D.   On: 04/03/2019 15:37    Assessment and plan- Patient is a 85 y.o. male with distal esophageal stage adenocarcinoma, stage IIb currently receiving salvage radiation therapy who presents to [facility] clinic for complaints of dehydration and continued poor oral intake.  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  Imaging showed thickening of GE junction and confirmed on barium swallow.  Upper endoscopy showed luminal narrowing with persistent tumor, hypermetabolic on PET.  Due to frailty he was not felt to be a good candidate for chemotherapy  and initiated salvage radiation on 04/16/2019.   Malnutrition/poor oral intake: Likely due to known esophageal narrowing causing increased mucous/secretions but could be exacerbated by radiation.  Will start Carafate.  He has completed 6 radiation treatments to date.  Patient continues glycopyrrolate 1 mg per 5 mL solutions once daily for secretions.  Appears to be helping some per patient.  Hypokalemia: Likely due to poor oral intake.  Will start potassium supplements.  He will receive 30 mEq of potassium IV today.   Plan: Stat labs today.  (Hypokalemia 2.9, elevated kidney function BUN 31, creatinine 1.45) Vital signs. Stable Weight. 5 lb weight loss Give 1 L NaCl Give 30 mEq potassium today in clinic. Give 10 mg Decadron. Give 8 mg Zofran. RX Carafate 1 g/10 ml QID.  RX Potassium 20 mEq packet BID X 7 days.  Can increase glycopyrrolate to twice daily.  Disposition: Return to clinic daily for radiation. Continue follow-up with dietary on 04/27/2019. Continue follow-up with palliative care on 04/28/2019. We will schedule for patient to have lab work, possible IV fluids and assessment by Dr. Janese Banks next week. Start Carafate 4 times daily for possible esophagitis. Start potassium 20 M EQ packets twice daily for 7 days for hypokalemia. Can increase glycopyrrolate to twice daily.   Visit Diagnosis  1. Dehydration   2. Weakness   3. Esophageal cancer, stage IIB [facility])     Patient expressed understanding and was in agreement with this plan. He also understands that He can call clinic at any time with any questions, concerns, or complaints.   Greater than 50% was spent in counseling and coordination of care with this patient including but not limited to discussion of the relevant topics above (See A&P) including, but not limited to diagnosis and management of acute and chronic medical conditions.   Thank you for allowing me to participate in the care of this very pleasant patient.   Jacquelin Hawking, NP [place] [facility] Cell - [phone] Pager- [phone] 04/23/2019 4:00 PM

## 2019-04-24 ENCOUNTER — Other Ambulatory Visit: Payer: Self-pay

## 2019-04-24 ENCOUNTER — Ambulatory Visit
Admission: RE | Admit: 2019-04-24 | Discharge: 2019-04-24 | Disposition: A | Discharge status: Home / Self Care | Payer: Medicare Other | Source: Ambulatory Visit | Attending: Radiation Oncology | Admitting: Radiation Oncology

## 2019-04-24 DIAGNOSIS — C155 Malignant neoplasm of lower third of esophagus: Secondary | ICD-10-CM | POA: Diagnosis not present

## 2019-04-24 DIAGNOSIS — Z510 Encounter for antineoplastic radiation therapy: Secondary | ICD-10-CM | POA: Diagnosis not present

## 2019-04-27 ENCOUNTER — Other Ambulatory Visit: Payer: Self-pay | Admitting: Nurse Practitioner

## 2019-04-27 ENCOUNTER — Telehealth: Payer: Self-pay | Admitting: Oncology

## 2019-04-27 ENCOUNTER — Ambulatory Visit
Admission: RE | Admit: 2019-04-27 | Discharge: 2019-04-27 | Disposition: A | Discharge status: Home / Self Care | Payer: Medicare Other | Source: Ambulatory Visit | Attending: Radiation Oncology | Admitting: Radiation Oncology

## 2019-04-27 ENCOUNTER — Other Ambulatory Visit: Payer: Self-pay

## 2019-04-27 ENCOUNTER — Other Ambulatory Visit: Payer: Self-pay | Admitting: *Deleted

## 2019-04-27 ENCOUNTER — Inpatient Hospital Stay: Payer: Medicare Other | Attending: Oncology

## 2019-04-27 ENCOUNTER — Inpatient Hospital Stay (HOSPITAL_BASED_OUTPATIENT_CLINIC_OR_DEPARTMENT_OTHER): Payer: Medicare Other | Admitting: Oncology

## 2019-04-27 VITALS — BP 118/74 | HR 92 | Temp 99.2°F | Resp 16 | Wt 115.5 lb

## 2019-04-27 DIAGNOSIS — R5381 Other malaise: Secondary | ICD-10-CM

## 2019-04-27 DIAGNOSIS — Z7984 Long term (current) use of oral hypoglycemic drugs: Secondary | ICD-10-CM | POA: Insufficient documentation

## 2019-04-27 DIAGNOSIS — R5383 Other fatigue: Secondary | ICD-10-CM | POA: Diagnosis not present

## 2019-04-27 DIAGNOSIS — R630 Anorexia: Secondary | ICD-10-CM | POA: Diagnosis not present

## 2019-04-27 DIAGNOSIS — Z9221 Personal history of antineoplastic chemotherapy: Secondary | ICD-10-CM | POA: Diagnosis not present

## 2019-04-27 DIAGNOSIS — C155 Malignant neoplasm of lower third of esophagus: Secondary | ICD-10-CM | POA: Diagnosis not present

## 2019-04-27 DIAGNOSIS — E860 Dehydration: Secondary | ICD-10-CM | POA: Insufficient documentation

## 2019-04-27 DIAGNOSIS — R531 Weakness: Secondary | ICD-10-CM

## 2019-04-27 DIAGNOSIS — I10 Essential (primary) hypertension: Secondary | ICD-10-CM | POA: Insufficient documentation

## 2019-04-27 DIAGNOSIS — E785 Hyperlipidemia, unspecified: Secondary | ICD-10-CM | POA: Diagnosis not present

## 2019-04-27 DIAGNOSIS — Z923 Personal history of irradiation: Secondary | ICD-10-CM | POA: Diagnosis not present

## 2019-04-27 DIAGNOSIS — E876 Hypokalemia: Secondary | ICD-10-CM

## 2019-04-27 DIAGNOSIS — R093 Abnormal sputum: Secondary | ICD-10-CM | POA: Diagnosis not present

## 2019-04-27 DIAGNOSIS — C159 Malignant neoplasm of esophagus, unspecified: Secondary | ICD-10-CM

## 2019-04-27 DIAGNOSIS — Z510 Encounter for antineoplastic radiation therapy: Secondary | ICD-10-CM | POA: Insufficient documentation

## 2019-04-27 DIAGNOSIS — E039 Hypothyroidism, unspecified: Secondary | ICD-10-CM | POA: Diagnosis not present

## 2019-04-27 DIAGNOSIS — Z8719 Personal history of other diseases of the digestive system: Secondary | ICD-10-CM | POA: Diagnosis not present

## 2019-04-27 DIAGNOSIS — R4702 Dysphasia: Secondary | ICD-10-CM | POA: Insufficient documentation

## 2019-04-27 DIAGNOSIS — R634 Abnormal weight loss: Secondary | ICD-10-CM | POA: Insufficient documentation

## 2019-04-27 DIAGNOSIS — Z79899 Other long term (current) drug therapy: Secondary | ICD-10-CM | POA: Insufficient documentation

## 2019-04-27 DIAGNOSIS — R112 Nausea with vomiting, unspecified: Secondary | ICD-10-CM | POA: Diagnosis not present

## 2019-04-27 DIAGNOSIS — R05 Cough: Secondary | ICD-10-CM

## 2019-04-27 MED ORDER — SUCRALFATE 1 G PO TABS: ORAL_TABLET | ORAL | 1 refills | Status: DC

## 2019-04-27 NOTE — Progress Notes (Addendum)
Nutrition Assessment   Reason for Assessment:   Weight loss, poor intake   ASSESSMENT:  85 year old male with stage IIb esophageal cancer diagnosed in Sept 2018 s/p carbo/taxol and radiation completed Dec 2018.  Noted re-established care in June 2020 due to weight loss and dysphagia and noted to have luminal narrowing due to tumor.  Patient has been restarted on radiation.  Palliative care is following and noted not interested in PEG at this time.    Was asked by Aaron Hammans, RN to contact Aaron Becker patient's daughter.  Spoke with Aaron Becker this pm.  Reports that patient is not eating much due to "slimy saliva". Carafate has not been approved yet. Daughter reports so far today has had rice krispie treat, 2 sips of coke and 2 sips of ensure max protein.  Does not like the 350 calorie shakes.  Daughter reports planning on coming back this afternoon for fluids.     Nutrition Focused Physical Exam: deferred   Medications: Vit C, lasix, amaryl, glycopyrrolate, metformin. Zpfran, K CL, carafate   Labs: K 2.9, BUN 31, creatinine 1.45   Anthropometrics:   Height: 63 inches Weight: 125 lb 7/28 Noted 139 lb on 6/3, 159 lb on 09/30/2018 BMI: 24  10% weight loss in 2 months, significant   Estimated Energy Needs  Kcals: 1700-1900 calories Protein: 85-95 g Fluid: 1.9 L   NUTRITION DIAGNOSIS: Inadequate oral intake related to esophageal cancer, dysphagia as evidenced by 10% weight loss in the last 2 months   INTERVENTION:  Discussed strategies to add calories and protein. Would not recommend diabetic diet but regular diet at this time due to poor po intake and weight loss.  Would recommend adjusting DM medication if blood glucose goes up. Encouraged daughter to continue to monitor blood glucose. Encouraged small frequent mini meals/snacks q 2 hours.  Discussed oral nutrition supplements for added calories and protein as well. Mailed high calorie, high protein recipes from [employer]. Contact information  provided   MONITORING, EVALUATION, GOAL: patient will consume adequate calories and protein to maintain weight   Next Visit: daughter to contact as needed  Aaron Becker B. Aaron Becker, Aaron Becker, Aaron Becker Registered Dietitian [phone] (pager)

## 2019-04-27 NOTE — Progress Notes (Signed)
Patient had XRT this morning and was advised by someone in radiation to return this afternoon for "IV".  He requested to get IVF tomorrow but they said he had to come this afternoon.  He was told he's not getting enough fluids.  Reports feeling "fairly well" if he could get the sticky feeling out of his throat and moth.  Every time he eats he gets nausea and meds do not help.

## 2019-04-28 ENCOUNTER — Other Ambulatory Visit: Payer: Self-pay

## 2019-04-28 ENCOUNTER — Inpatient Hospital Stay: Payer: Medicare Other

## 2019-04-28 ENCOUNTER — Ambulatory Visit: Payer: Medicare Other | Admitting: Family Medicine

## 2019-04-28 ENCOUNTER — Ambulatory Visit
Admission: RE | Admit: 2019-04-28 | Discharge: 2019-04-28 | Disposition: A | Discharge status: Home / Self Care | Payer: Medicare Other | Source: Ambulatory Visit | Attending: Radiation Oncology | Admitting: Radiation Oncology

## 2019-04-28 ENCOUNTER — Inpatient Hospital Stay: Payer: Medicare Other | Admitting: Oncology

## 2019-04-28 DIAGNOSIS — Z510 Encounter for antineoplastic radiation therapy: Secondary | ICD-10-CM | POA: Diagnosis not present

## 2019-04-28 DIAGNOSIS — C155 Malignant neoplasm of lower third of esophagus: Secondary | ICD-10-CM | POA: Diagnosis not present

## 2019-04-28 DIAGNOSIS — E876 Hypokalemia: Secondary | ICD-10-CM | POA: Insufficient documentation

## 2019-04-28 DIAGNOSIS — E43 Unspecified severe protein-calorie malnutrition: Secondary | ICD-10-CM | POA: Insufficient documentation

## 2019-04-28 NOTE — Progress Notes (Signed)
Symptom Management Consult note [place]  [phone] [phone]  Patient Care Team: Tonia Ghent, MD as PCP - General (Family Medicine) Bary Castilla, Forest Gleason, MD (General Surgery) Modesto Charon, MD (Family Medicine) Lucilla Lame, MD as Consulting Physician (Gastroenterology) Lequita Asal, MD as Referring Physician (Hematology and Oncology) Clent Jacks, RN as Registered Nurse Noreene Filbert, MD as Referring Physician (Radiation Oncology) Sindy Guadeloupe, MD as Consulting Physician (Oncology)   Name of the patient: Aaron Becker  [id number]  Apr 22, 1930   Date of visit: 04/27/2019   Diagnosis-esophageal cancer  Chief complaint/ Reason for visit-weakness/dehydration  Heme/Onc history:  Oncology History Overview Note  Aaron Becker is a 85 y.o. male with clinical stage IIB esophageal cancer (uT2N0).  He presented with an upper GI bleed.  EGD on 06/11/2017 revealed one cratered esophageal ulcer and stigmata of recent esophogeal bleeding at the GE junction.  Biopsy revealed poorly differentiated adenocarcinoma, signet ring type.  There was one non-bleeding cratered gastric ulcer with no stigmata of bleeding in the gastric antrum.  Duodenum was normal.  Colonoscopy on 03/30/2015 revealed a 20 mm tubulovillous adenoma of the rectum.  Flexible sigmoidoscopy on 06/01/2017 revealed a sessile polyp in the rectum and grade II non-bleeding internal hemorrhoids.  PET scan  on 06/27/2017 that revealed no appreciable abnormal esophageal hypermetabolic activity to correspond with the reported tumor. There was no adenopathy or other findings of metastatic disease. There was question that the original tumor is either low-grade or very small.  Upper endoscopic ultrasound (EUS) on 07/04/2017 revealed a 1-2cm ulcerated mass at the GE junction that was partially circumferential and nonobstructive. The mass is 1 cm in thickness and  occupies 3/4 of the  lumen circumference at the GE junction. The mass clearly passes into, but not through the muscularis propria layer of the esophageal wall. There was no paraesophageal adenopathy. Clinical stage was uT2N0 GE junction adenocarcinoma.  He received 5 weeks of carboplatin and Taxol with radiation (07/29/2017 - 09/03/2017).  Radiation completed on 2020-06-1417.  PET scan on 12/04/2017 revealed a small focus of mild uptake (SUV 3.77; background 2.73) localizing to the caudate lobe of liver. There was no corresponding CT abnormality.  Upper endoscopy on 12/31/2017 revealed erythematous mucosa in the esophagus.  Biopsy revealed no malignancy.   EGD on 05/08/2018 revealed a normal esophagus and congested mucosa in the cardia.  GE junction biopsy revealed squamocolumnar mucosa and columnar-lined mucosa with mild chronic inflammation, negative for goblet cells, dysplasia and malignancy. 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  PET scan on 05/20/2018 revealed no FDG evidence of esophageal carcinoma.  There was no evidence of distant metastatic disease.  PDL-1 testing revealed a CPS score of 2 (expressed).  MMR/MSI was intact.  Her2/neu was 1+.  There was not enough tissue for EGFR testing for [facility]   Esophageal cancer, stage IIB ([place])  07/11/2017 Initial Diagnosis   Esophageal cancer, stage IIB (HCC)      Interval history-Aaron Becker is an 85 year old male who presents to [facility] with above history for complaints of generalized weakness and dehydration.  He has been seen several times in our clinic for similar complaints.  He is currently receiving daily radiation due to luminal narrowing from tumor.  Continues to not be interested in PEG tube at this time.  Today, patient states "he is not sure why he is here".  He feels fine but his  daughter and significant other requested he received additional IV fluids because he  continues to have dysphasia and decreased oral intake.  He has met with our dietitian who recommends a regular diet at this time with small frequent meals, snacks every 2 hours.  Increased oral nutrition with added calories and high protein calorie recipes.  He continues to cough up "slimy sputum" preventing him from being able to eat and drink.  He denies vomiting food just "slime".  Current medication regimen includes glycopyrrolate, liquid Zofran.  Having trouble getting insurance to approve potassium packets and Carafate.  He admits to intermittent sore throats due to coughing.  He has very little appetite.  He feels that food is getting stuck and that is what is causing him to cough.  He would prefer not to eat and adamantly declines a PEG tube at this time.  He denies fevers or illnesses, bleeding or bruising, chest pain, constipation or diarrhea.  ECOG FS:3 - Symptomatic, >50% confined to bed  Review of systems- Review of Systems  Constitutional: Positive for malaise/fatigue and weight loss. Negative for chills and fever.  HENT: Negative for congestion, ear pain and tinnitus.   Eyes: Negative.  Negative for blurred vision and double vision.  Respiratory: Positive for cough and sputum production (Slimy). Negative for shortness of breath.   Cardiovascular: Negative.  Negative for chest pain, palpitations and leg swelling.  Gastrointestinal: Positive for nausea and vomiting. Negative for abdominal pain, constipation and diarrhea.  Genitourinary: Negative for dysuria, frequency and urgency.  Musculoskeletal: Negative for back pain and falls.  Skin: Negative.  Negative for rash.  Neurological: Positive for dizziness (When standing) and weakness. Negative for headaches.  Endo/Heme/Allergies: Negative.  Does not bruise/bleed easily.  Psychiatric/Behavioral: Negative.  Negative for depression. The patient is not nervous/anxious and does not have insomnia.      Current  treatment-radiation  Allergies  Allergen Reactions   Flomax [Tamsulosin Hcl] Other (See Comments)    syncope   Ibuprofen Other (See Comments)    Oral blisters at high doses   Rosuvastatin     REACTION: muscle stiffness   Sulfa Antibiotics Rash   Sulfonamide Derivatives Rash     Past Medical History:  Diagnosis Date   Anemia    Arthritis    hands   Bradycardia    Colon polyp    Diabetes mellitus, type II ([place]) 1986   oral meds   Dysphasia    Elevated PSA 2011   per Dr. Jacqlyn Larsen with Uro no problems last 5 or 6 yrs   Esophageal cancer ([place]) dx sept 18 2018   Excessive oral secretions    HOH (hard of hearing)    bilateral AIDES   Hyperlipidemia    Hypertension 02/02   off bp meds since hospitalization 3 weeks ago   Hypothyroidism 1980's   Palpitations    Right rib fracture 2015   SVT (supraventricular tachycardia) ([place])    a. reported SVT during admission at [facility] 06/2014   Syncope and collapse    none recent last few weeks   Wears dentures    upper and lower     Past Surgical History:  Procedure Laterality Date   [place], 09/2015   COMPRESSED FRACTURE SPINE   carotid ultrasound  02/02   wnl   CATARACT EXTRACTION W/PHACO Right 03/19/2016   Procedure: CATARACT EXTRACTION PHACO AND INTRAOCULAR LENS PLACEMENT ([place]);  Surgeon: Ronnell Freshwater, MD;  Location: [facility];  Service: Ophthalmology;  Laterality: Right;  DIABETIC-oral med RIGHT   CATARACT EXTRACTION W/PHACO Left 04/23/2016   Procedure: CATARACT EXTRACTION PHACO AND INTRAOCULAR LENS PLACEMENT (I[facility]);  Surgeon: Ronnell Freshwater, MD;  Location: [facility];  Service: Ophthalmology;  Laterality: Left;  LEFT DIABETIC - oral meds   CHOLECYSTECTOMY  1967   COLONOSCOPY W/ POLYPECTOMY  2012   Villous adenoma from the rectum without high-grade dysplasia, 10 mm   COLONOSCOPY WITH PROPOFOL N/A 03/30/2015   Procedure: COLONOSCOPY WITH PROPOFOL;   Surgeon: Robert Bellow, MD;  Location: [facility] ENDOSCOPY;  Service: Endoscopy;  Laterality: N/A;   CYST EXCISION     thumb   CYSTECTOMY  06/14/04   mucous cyst excision with left thumb, IP joint debridement   ESOPHAGOGASTRODUODENOSCOPY (EGD) WITH PROPOFOL N/A 06/11/2017   Procedure: ESOPHAGOGASTRODUODENOSCOPY (EGD) WITH PROPOFOL;  Surgeon: Lucilla Lame, MD;  Location: [facility] ENDOSCOPY;  Service: Endoscopy;  Laterality: N/A;   ESOPHAGOGASTRODUODENOSCOPY (EGD) WITH PROPOFOL N/A 12/31/2017   Procedure: ESOPHAGOGASTRODUODENOSCOPY (EGD) WITH PROPOFOL;  Surgeon: [name], MD;  Location: [facility] ENDOSCOPY;  Service: Endoscopy;  Laterality: N/A;   ESOPHAGOGASTRODUODENOSCOPY (EGD) WITH PROPOFOL N/A 05/08/2018   Procedure: ESOPHAGOGASTRODUODENOSCOPY (EGD) WITH PROPOFOL;  Surgeon: [name], MD;  Location: [facility] ENDOSCOPY;  Service: Gastroenterology;  Laterality: N/A;   ESOPHAGOGASTRODUODENOSCOPY (EGD) WITH PROPOFOL N/A 03/20/2019   Procedure: ESOPHAGOGASTRODUODENOSCOPY (EGD) WITH PROPOFOL;  Surgeon: [name], MD;  Location: [facility] ENDOSCOPY;  Service: Gastroenterology;  Laterality: N/A;   EUS N/A 07/04/2017   Procedure: UPPER ENDOSCOPIC ULTRASOUND (EUS) RADIAL;  Surgeon: [name], MD;  Location: [facility] ENDOSCOPY;  Service: Endoscopy;  Laterality: N/A;   EYE SURGERY     FLEXIBLE SIGMOIDOSCOPY N/A 06/11/2017   Procedure: FLEXIBLE SIGMOIDOSCOPY;  Surgeon: Lucilla Lame, MD;  Location: [facility] ENDOSCOPY;  Service: Endoscopy;  Laterality: N/A;   HERNIA REPAIR  2013   KYPHOPLASTY N/A 10/06/2015   Procedure: Thoracic twelve kyphoplasty;  Surgeon: Karie Chimera, MD;  Location: [place] NEURO ORS;  Service: Neurosurgery;  Laterality: N/A;  T12 Kyphoplasty   PROSTATE BIOPSY  11/01/08   Dr. Reece Agar   VARICOSE VEIN SURGERY  1975   VASECTOMY  1975    Social History   Socioeconomic History   Marital status: Widowed    Spouse name: Not on file   Number of children: Not on file   Years of education:  Not on file   Highest education level: Not on file  Occupational History   Occupation: Pharmacist, community: retired    Comment: Retired 1999  Scientist, product/process development strain: Not on file   Food insecurity    Worry: Not on file    Inability: Not on Lexicographer needs    Medical: Not on file    Non-medical: Not on file  Tobacco Use   Smoking status: Never Smoker   Smokeless tobacco: Never Used  Substance and Sexual Activity   Alcohol use: No    Alcohol/week: 0.0 standard drinks   Drug use: Never   Sexual activity: Never  Lifestyle   Physical activity    Days per week: Not on file    Minutes per session: Not on file   Stress: Not on file  Relationships   Social connections    Talks on phone: Not on file    Gets together: Not on file    Attends religious service: Not on file    Active member of club or organization: Not on file    Attends  meetings of clubs or organizations: Not on file    Relationship status: Not on file   Intimate partner violence    Fear of current or ex partner: Not on file    Emotionally abused: Not on file    Physically abused: Not on file    Forced sexual activity: Not on file  Other Topics Concern   Not on file  Social History Narrative   Married- widower 2000 ([place]), not marred but with partner since 2001.    Retired from [employer].     [employer] '[employer], stationed in [place]    Family History  Problem Relation Age of Onset   Cancer Father        liver   Cancer Sister        breast   Cancer Brother        skin   Cancer Sister        breast   Heart disease Sister        pacer   Heart disease Sister    Cancer Sister        breast cancer   Heart disease Sister    Colon cancer Neg Hx    Prostate cancer Neg Hx      Current Outpatient Medications:    acetaminophen (TYLENOL) 500 MG tablet, Take 500-1,000 mg by mouth every 6 (six) hours as needed (for pain.)., Disp: ,  Rfl:    Ascorbic Acid (VITAMIN C) 1000 MG tablet, Take 1,000 mg by mouth daily., Disp: , Rfl:    Blood Glucose Monitoring Suppl (ONE TOUCH ULTRA 2) w/Device KIT, Use once to twice daily as needed Dx E11.9, Disp: 1 each, Rfl: 0   furosemide (LASIX) 20 MG tablet, Take 1-2 tablets (20-40 mg total) by mouth daily as needed for fluid., Disp: , Rfl:    glimepiride (AMARYL) 1 MG tablet, TAKE 2 TABLETS(2 MG) BY MOUTH DAILY WITH BREAKFAST, Disp: 180 tablet, Rfl: 3   glucose blood (ONE TOUCH ULTRA TEST) test strip, TO TEST BLOOD SUGAR ONCE OR TWICE DAILY AND AS DIRECTED DX E11.9.  H/o variable blood sugars., Disp: 200 each, Rfl: 1   Glycopyrrolate 1 MG/5ML SOLN, Take 5 mLs (1 mg total) by mouth every morning., Disp: 1350 mL, Rfl: 0   levothyroxine (SYNTHROID) 75 MCG tablet, TAKE 1 TABLET(75 MCG) BY MOUTH DAILY, Disp: 90 tablet, Rfl: 1   metFORMIN (GLUCOPHAGE) 500 MG tablet, TAKE 1 TABLET BY MOUTH EVERY MORNING AND 2 TABLETS BY MOUTH AT SUPPER AND 1 TABLET AT NIGHT IF NEEDED, Disp: 360 tablet, Rfl: 0   ondansetron (ZOFRAN) 4 MG/5ML solution, Take 10 mLs (8 mg total) by mouth every 8 hours as needed for nausea., Disp: 150 mL, Rfl: 0   pantoprazole (PROTONIX) 40 MG tablet, Take 1 tablet (40 mg total) by mouth 2 (two) times daily., Disp: 180 tablet, Rfl: 3   potassium chloride (KLOR-CON) 20 MEQ packet, Take 20 mEq by mouth 2 (two) times daily., Disp: 14 packet, Rfl: 0   pravastatin (PRAVACHOL) 80 MG tablet, TAKE 1 TABLET(80 MG) BY MOUTH AT BEDTIME, Disp: 90 tablet, Rfl: 3   sucralfate (CARAFATE) 1 g tablet, DISSOLVE 1 TABLET IN 10 MLS OF WATER THEN TAKE BY MOUTH FOUR TIMES DAILY WITH MEALS AND AT BEDTIME, Disp: 360 tablet, Rfl: 1  Physical exam:  Vitals:   04/27/19 1508  BP: 118/74  Pulse: 92  Resp: 16  Temp: 99.2 F (37.3 C)  Weight: 115 lb 8 oz (52.4 kg)   Physical  Exam Vitals reviewed: Frail.  Constitutional:      Appearance: Normal appearance.  HENT:     Head: Normocephalic and  atraumatic.  Eyes:     Pupils: Pupils are equal, round, and reactive to light.  Neck:     Musculoskeletal: Normal range of motion.  Cardiovascular:     Rate and Rhythm: Normal rate and regular rhythm.     Heart sounds: Normal heart sounds. No murmur.  Pulmonary:     Effort: Pulmonary effort is normal.     Breath sounds: Normal breath sounds. No wheezing.  Abdominal:     General: Bowel sounds are normal. There is no distension.     Palpations: Abdomen is soft.     Tenderness: There is no abdominal tenderness.  Musculoskeletal: Normal range of motion.  Skin:    General: Skin is warm and dry.     Coloration: Skin is pale.     Findings: No rash.  Neurological:     Mental Status: He is alert and oriented to person, place, and time.  Psychiatric:        Judgment: Judgment normal.      CMP Latest Ref Rng & Units 04/23/2019  Glucose 70 - 99 mg/dL 177(H)  BUN 8 - 23 mg/dL 31(H)  Creatinine 0.61 - 1.24 mg/dL 1.45(H)  Sodium 135 - 145 mmol/L 142  Potassium 3.5 - 5.1 mmol/L 2.9(L)  Chloride 98 - 111 mmol/L 102  CO2 22 - 32 mmol/L 28  Calcium 8.9 - 10.3 mg/dL 9.1  Total Protein 6.5 - 8.1 g/dL -  Total Bilirubin 0.3 - 1.2 mg/dL -  Alkaline Phos 38 - 126 U/L -  AST 15 - 41 U/L -  ALT 0 - 44 U/L -   CBC Latest Ref Rng & Units 04/21/2019  WBC 4.0 - 10.5 K/uL 11.5(H)  Hemoglobin 13.0 - 17.0 g/dL 11.6(L)  Hematocrit 39.0 - 52.0 % 35.2(L)  Platelets 150 - 400 K/uL 180    No images are attached to the encounter.  Nm Pet Image Restag (ps) Skull Base To Thigh  Result Date: 04/03/2019 CLINICAL DATA:  Subsequent treatment strategy for esophageal carcinoma. EXAM: NUCLEAR MEDICINE PET SKULL BASE TO THIGH TECHNIQUE: 7.12 mCi F-18 FDG was injected intravenously. Full-ring PET imaging was performed from the skull base to thigh after the radiotracer. CT data was obtained and used for attenuation correction and anatomic localization. Fasting blood glucose: 98 mg/dl COMPARISON:  PET-CT 05/20/2018.  FINDINGS: Mediastinal blood pool activity: SUV max 1.89 Liver activity: SUV max NA NECK: No hypermetabolic lymph nodes in the neck. Incidental CT findings: none CHEST: No FDG avid axillary, supraclavicular, mediastinal, or hilar lymph nodes. No suspicious pulmonary nodules. Corresponding to the increased soft tissue on recent chest CT from 03/02/2019 there is FDG avid tumor involving the distal esophagus and proximal stomach. This extends across the GE junction measuring approximately 6.9 cm in length with an SUV max of 6.34. Incidental CT findings: Aortic atherosclerosis. Calcification in the left main, lad and RCA coronary arteries noted. ABDOMEN/PELVIS: There is no abnormal uptake within the liver, pancreas, spleen, or adrenal glands. No hypermetabolic lymph nodes within the abdomen or pelvis. Incidental CT findings: Aortic atherosclerosis. No aneurysm. Prostate gland enlargement. SKELETON: No focal hypermetabolic activity to suggest skeletal metastasis. Chronic compression deformity is noted involving T12, status post kyphoplasty. Incidental CT findings: none IMPRESSION: 1. Distal esophageal and proximal gastric lesion is FDG avid within SUV max of 6.34. 2. No findings to suggest FDG avid nodal metastasis  or distant metastatic disease. 3. Aortic Atherosclerosis (ICD10-I70.0). Coronary artery calcifications. Electronically Signed   By: Kerby Moors M.D.   On: 04/03/2019 15:37     Assessment and plan- Patient is a 85 y.o. male who presents to [facility] for continued dehydration due to dysphasia and the inability to keep food down.  Currently receiving daily radiation for luminal narrowing due to tumor in the esophagus.  Esophageal cancer: Previously treated with concurrent carbo/Taxol and radiation in 2018.  Noted progressive dysphasia and weight loss in June 2020 prompting imaging.  Imaging revealed thickening of GE junction and confirmed on barium swallow.  Upper endoscopy showed luminal narrowing with  persistent tumor and hypermetabolic on PET.  Due to frailty he is not felt to be a good candidate for chemotherapy and he was initiated on salvage radiation beginning on 04/16/2019.  He is currently status post 7 radiation treatments.  Plan is for 15 treatments and reimaging.  Malnutrition/poor oral intake/dehydration: Likely due to known esophageal narrowing causing increasing mucus/secretions exacerbated by radiation.  Unfortunately he had some trouble getting his medications approved by his insurance company.  As of today, he has been approved for Carafate and potassium packets due to dysphasia.  He continues glycopyrrolate 1 mg per 5 mL solution's twice daily for secretions (recently increased).  Receives weekly IV fluids and electrolytes PRN.   Hypokalemia: He was scheduled to have labs drawn today but they were not.  He is to return to clinic tomorrow for labs prior to radiation.  He just started his potassium supplements and at last lab draw his potassium was 2.9.  He received 30 mEq IV potassium last week.  Weight loss: Weight today is 115 pounds. 10 pound weight loss since 04/23/19.   Plan: Stat labs tomorrow (04/28/19) 1 L NaCl in clinic today. High-calorie meal per dietary.  Continue 20 mEq potassium dissolvable packets twice daily X 7 days. Start Carafate.  Continue glycopyrrolate twice daily for secretions.  Disposition: RTC tomorrow for labs prior to radiation.  Will review labs to see if patient needs additional IV fluids or electrolytes. Continue daily for radiation. Return to clinic on 04/30/2019 for palliative care and assessment with Dr. Janese Banks appointments.   Visit Diagnosis 1. Dehydration   2. Esophageal cancer, stage IIB (HCC)   3. Weakness   4. Nausea and vomiting, intractability of vomiting not specified, unspecified vomiting type     Patient expressed understanding and was in agreement with this plan. He also understands that He can call clinic at any time with any  questions, concerns, or complaints.   Greater than 50% was spent in counseling and coordination of care with this patient including but not limited to discussion of the relevant topics above (See A&P) including, but not limited to diagnosis and management of acute and chronic medical conditions.   Thank you for allowing me to participate in the care of this very pleasant patient.    Jacquelin Hawking, NP [place] [facility] Cell - [phone] Pager- [phone] 04/28/2019 1:38 PM

## 2019-04-29 ENCOUNTER — Encounter: Payer: Medicare Other | Admitting: Hospice and Palliative Medicine

## 2019-04-29 ENCOUNTER — Ambulatory Visit
Admission: RE | Admit: 2019-04-29 | Discharge: 2019-04-29 | Disposition: A | Discharge status: Home / Self Care | Payer: Medicare Other | Source: Ambulatory Visit | Attending: Radiation Oncology | Admitting: Radiation Oncology

## 2019-04-29 ENCOUNTER — Other Ambulatory Visit: Payer: Self-pay

## 2019-04-29 DIAGNOSIS — C155 Malignant neoplasm of lower third of esophagus: Secondary | ICD-10-CM | POA: Diagnosis not present

## 2019-04-29 DIAGNOSIS — Z510 Encounter for antineoplastic radiation therapy: Secondary | ICD-10-CM | POA: Diagnosis not present

## 2019-04-30 ENCOUNTER — Inpatient Hospital Stay: Payer: Medicare Other | Admitting: Oncology

## 2019-04-30 ENCOUNTER — Inpatient Hospital Stay (HOSPITAL_BASED_OUTPATIENT_CLINIC_OR_DEPARTMENT_OTHER): Payer: Medicare Other | Admitting: Oncology

## 2019-04-30 ENCOUNTER — Other Ambulatory Visit: Payer: Self-pay

## 2019-04-30 ENCOUNTER — Inpatient Hospital Stay: Payer: Medicare Other

## 2019-04-30 ENCOUNTER — Encounter: Payer: Self-pay | Admitting: Oncology

## 2019-04-30 ENCOUNTER — Ambulatory Visit
Admission: RE | Admit: 2019-04-30 | Discharge: 2019-04-30 | Disposition: A | Discharge status: Home / Self Care | Payer: Medicare Other | Source: Ambulatory Visit | Attending: Radiation Oncology | Admitting: Radiation Oncology

## 2019-04-30 ENCOUNTER — Ambulatory Visit: Payer: Medicare Other

## 2019-04-30 VITALS — BP 101/66 | HR 82 | Temp 98.1°F | Resp 16

## 2019-04-30 DIAGNOSIS — E785 Hyperlipidemia, unspecified: Secondary | ICD-10-CM | POA: Diagnosis not present

## 2019-04-30 DIAGNOSIS — Z923 Personal history of irradiation: Secondary | ICD-10-CM | POA: Diagnosis not present

## 2019-04-30 DIAGNOSIS — E860 Dehydration: Secondary | ICD-10-CM

## 2019-04-30 DIAGNOSIS — R112 Nausea with vomiting, unspecified: Secondary | ICD-10-CM | POA: Diagnosis not present

## 2019-04-30 DIAGNOSIS — E876 Hypokalemia: Secondary | ICD-10-CM

## 2019-04-30 DIAGNOSIS — Z9221 Personal history of antineoplastic chemotherapy: Secondary | ICD-10-CM | POA: Diagnosis not present

## 2019-04-30 DIAGNOSIS — N179 Acute kidney failure, unspecified: Secondary | ICD-10-CM

## 2019-04-30 DIAGNOSIS — R5383 Other fatigue: Secondary | ICD-10-CM | POA: Diagnosis not present

## 2019-04-30 DIAGNOSIS — R634 Abnormal weight loss: Secondary | ICD-10-CM | POA: Diagnosis not present

## 2019-04-30 DIAGNOSIS — E039 Hypothyroidism, unspecified: Secondary | ICD-10-CM | POA: Diagnosis not present

## 2019-04-30 DIAGNOSIS — R05 Cough: Secondary | ICD-10-CM | POA: Diagnosis not present

## 2019-04-30 DIAGNOSIS — R093 Abnormal sputum: Secondary | ICD-10-CM | POA: Diagnosis not present

## 2019-04-30 DIAGNOSIS — Z510 Encounter for antineoplastic radiation therapy: Secondary | ICD-10-CM | POA: Diagnosis not present

## 2019-04-30 DIAGNOSIS — R630 Anorexia: Secondary | ICD-10-CM | POA: Diagnosis not present

## 2019-04-30 DIAGNOSIS — I10 Essential (primary) hypertension: Secondary | ICD-10-CM | POA: Diagnosis not present

## 2019-04-30 DIAGNOSIS — C159 Malignant neoplasm of esophagus, unspecified: Secondary | ICD-10-CM | POA: Diagnosis not present

## 2019-04-30 DIAGNOSIS — R5381 Other malaise: Secondary | ICD-10-CM | POA: Diagnosis not present

## 2019-04-30 DIAGNOSIS — Z7189 Other specified counseling: Secondary | ICD-10-CM | POA: Diagnosis not present

## 2019-04-30 DIAGNOSIS — R4702 Dysphasia: Secondary | ICD-10-CM | POA: Diagnosis not present

## 2019-04-30 DIAGNOSIS — C155 Malignant neoplasm of lower third of esophagus: Secondary | ICD-10-CM | POA: Diagnosis not present

## 2019-04-30 LAB — CBC WITH DIFFERENTIAL/PLATELET
Abs Immature Granulocytes: 0.06 10*3/uL (ref 0.00–0.07)
Basophils Absolute: 0 10*3/uL (ref 0.0–0.1)
Basophils Relative: 0 %
Eosinophils Absolute: 0 10*3/uL (ref 0.0–0.5)
Eosinophils Relative: 0 %
HCT: 37.3 % — ABNORMAL LOW (ref 39.0–52.0)
Hemoglobin: 12.3 g/dL — ABNORMAL LOW (ref 13.0–17.0)
Immature Granulocytes: 1 %
Lymphocytes Relative: 1 %
Lymphs Abs: 0.1 10*3/uL — ABNORMAL LOW (ref 0.7–4.0)
MCH: 28.7 pg (ref 26.0–34.0)
MCHC: 33 g/dL (ref 30.0–36.0)
MCV: 86.9 fL (ref 80.0–100.0)
Monocytes Absolute: 0.4 10*3/uL (ref 0.1–1.0)
Monocytes Relative: 5 %
Neutro Abs: 9.2 10*3/uL — ABNORMAL HIGH (ref 1.7–7.7)
Neutrophils Relative %: 93 %
Platelets: 193 10*3/uL (ref 150–400)
RBC: 4.29 MIL/uL (ref 4.22–5.81)
RDW: 16.1 % — ABNORMAL HIGH (ref 11.5–15.5)
WBC: 9.8 10*3/uL (ref 4.0–10.5)
nRBC: 0 % (ref 0.0–0.2)

## 2019-04-30 LAB — COMPREHENSIVE METABOLIC PANEL
ALT: 28 U/L (ref 0–44)
AST: 26 U/L (ref 15–41)
Albumin: 3.3 g/dL — ABNORMAL LOW (ref 3.5–5.0)
Alkaline Phosphatase: 65 U/L (ref 38–126)
Anion gap: 11 (ref 5–15)
BUN: 42 mg/dL — ABNORMAL HIGH (ref 8–23)
CO2: 27 mmol/L (ref 22–32)
Calcium: 8.8 mg/dL — ABNORMAL LOW (ref 8.9–10.3)
Chloride: 103 mmol/L (ref 98–111)
Creatinine, Ser: 1.77 mg/dL — ABNORMAL HIGH (ref 0.61–1.24)
GFR calc Af Amer: 39 mL/min — ABNORMAL LOW (ref >60)
GFR calc non Af Amer: 34 mL/min — ABNORMAL LOW (ref >60)
Glucose, Bld: 201 mg/dL — ABNORMAL HIGH (ref 70–99)
Potassium: 2.7 mmol/L — CL (ref 3.5–5.1)
Sodium: 141 mmol/L (ref 135–145)
Total Bilirubin: 0.8 mg/dL (ref 0.3–1.2)
Total Protein: 6.4 g/dL — ABNORMAL LOW (ref 6.5–8.1)

## 2019-04-30 LAB — MAGNESIUM: Magnesium: 1.8 mg/dL (ref 1.7–2.4)

## 2019-04-30 MED ORDER — SODIUM CHLORIDE 0.9 % IV SOLN
Freq: Once | INTRAVENOUS | Status: AC
  Administered 2019-04-30: 12:00:00 via INTRAVENOUS
  Filled 2019-04-30: qty 15

## 2019-04-30 NOTE — Progress Notes (Signed)
Patient here for palliative care follow up and evaluation with Dr. Janese Banks. He denies having any pain. He states that he is feeling fairly well. When asked if he is feeling weak, he states the he is feeling "pretty good".

## 2019-05-01 ENCOUNTER — Telehealth: Payer: Self-pay | Admitting: *Deleted

## 2019-05-01 ENCOUNTER — Ambulatory Visit
Admission: RE | Admit: 2019-05-01 | Discharge: 2019-05-01 | Disposition: A | Discharge status: Home / Self Care | Payer: Medicare Other | Source: Ambulatory Visit | Attending: Radiation Oncology | Admitting: Radiation Oncology

## 2019-05-01 ENCOUNTER — Other Ambulatory Visit: Payer: Self-pay

## 2019-05-01 ENCOUNTER — Other Ambulatory Visit: Payer: Self-pay | Admitting: *Deleted

## 2019-05-01 DIAGNOSIS — Z510 Encounter for antineoplastic radiation therapy: Secondary | ICD-10-CM | POA: Diagnosis not present

## 2019-05-01 DIAGNOSIS — C155 Malignant neoplasm of lower third of esophagus: Secondary | ICD-10-CM | POA: Diagnosis not present

## 2019-05-01 MED ORDER — HYDROCODONE-ACETAMINOPHEN 7.5-325 MG/15ML PO SOLN: 10.0000 mL | Freq: Four times a day (QID) | ORAL | 0 refills | Status: AC | PRN

## 2019-05-01 MED ORDER — MAGIC MOUTHWASH W/LIDOCAINE: 5.0000 mL | Freq: Four times a day (QID) | ORAL | 1 refills | Status: AC | PRN

## 2019-05-01 NOTE — Telephone Encounter (Signed)
I called and spoke to daughter and pain med went to [facility] and MMW went to [facility] and there is a telephone note in about this due to cost to pt.

## 2019-05-01 NOTE — Telephone Encounter (Signed)
Daughter called stating 2 prescription were to be sent to pharmacy yesterday for this patient, Magic Mouthwash and Liquid Hydrocodone and the pharmacy told her they have not received either one. Please send them asap  From 04/30/19 office note: Patient also reports burning and pain when he swallows and I will give him a prescription for Magic mouthwash to swish and swallow as well as oxycodone liquid 5 mg every 6 as needed for his pain.  I do not see any evidence of mucositis or thrush today.

## 2019-05-01 NOTE — Telephone Encounter (Signed)
I called [facility] for MMW with liodocaine and pharmacist says that the pharmacy has inc. Cost of MMW and it will be at least 60 dollars and rec: we get it at [facility]. I called daughter Rodena Piety and told her the above and she wanted [facility] to [facility]. I told her I will call it in an d give the info and insurance for pt. She may want to bring copy of card if it is needed. Called [facility] and gave rx and all pt. Info along with insurance and they will get it ready. The daughter was notified that it was done

## 2019-05-01 NOTE — Progress Notes (Signed)
Hematology/Oncology Consult note [facility]  [phone] [phone]  Patient Care Team: Tonia Ghent, MD as PCP - General (Family Medicine) Bary Castilla, Forest Gleason, MD (General Surgery) Modesto Charon, MD (Family Medicine) Lucilla Lame, MD as Consulting Physician (Gastroenterology) Lequita Asal, MD as Referring Physician (Hematology and Oncology) Clent Jacks, RN as Registered Nurse Noreene Filbert, MD as Referring Physician (Radiation Oncology) Sindy Guadeloupe, MD as Consulting Physician (Oncology)   Name of the patient: Aaron Becker  [id number]  23-Aug-1930   Date of visit: 05/01/19  Diagnosis-history of stage II esophageal cancer status post concurrent chemoradiation now with local recurrence  Chief complaint/ Reason for visit-acute visit for ongoing poor oral intake and dehydration  Heme/Onc history: Patient is a 85 yr old male with h/o Stage IIB esophageal cancer T2N0 diagnosed in sept 2018. He received concurrent carbo/taxol RT for 5 weeks which h completed in dec 2018. 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 He was also seen by Dr. Fanny Skates at [facility] and plan was watchful waiting versus 5 fu single agent chemo. Patient chose watchful waiting  Patient had repeat ct scans in June 2020 for weight loss/ difficulty swallowing. Scans showed no distant metastases but increased GE junction thickening confirmed on barium swallow. He underwent EGD by Dr. Vicente Males which showed luminal narrowing by persistent tumor Patient is not a candidate for chemotherapy.  Palliative options including radiation versus hospice was discussed with the patient.  Patient is currently undergoing palliative radiation  Interval history-patient continues to have poor oral intake and reports burning in his mouth and pain when he swallows.  He was also supposed to start taking oral potassium powder which he has not  started taking yet.  He did receive his prescription yesterday.  ECOG PS- 3 Pain scale- 3 Opioid associated constipation- no  Review of systems- Review of Systems  Constitutional: Positive for malaise/fatigue. Negative for chills, fever and weight loss.  HENT: Negative for congestion, ear discharge and nosebleeds.   Eyes: Negative for blurred vision.  Respiratory: Negative for cough, hemoptysis, sputum production, shortness of breath and wheezing.   Cardiovascular: Negative for chest pain, palpitations, orthopnea and claudication.  Gastrointestinal: Negative for abdominal pain, blood in stool, constipation, diarrhea, heartburn, melena, nausea and vomiting.  Genitourinary: Negative for dysuria, flank pain, frequency, hematuria and urgency.  Musculoskeletal: Negative for back pain, joint pain and myalgias.  Skin: Negative for rash.  Neurological: Negative for dizziness, tingling, focal weakness, seizures, weakness and headaches.  Endo/Heme/Allergies: Does not bruise/bleed easily.  Psychiatric/Behavioral: Negative for depression and suicidal ideas. The patient does not have insomnia.       Allergies  Allergen Reactions  . Flomax [Tamsulosin Hcl] Other (See Comments)    syncope  . Ibuprofen Other (See Comments)    Oral blisters at high doses  . Rosuvastatin     REACTION: muscle stiffness  . Sulfa Antibiotics Rash  . Sulfonamide Derivatives Rash     Past Medical History:  Diagnosis Date  . Anemia   . Arthritis    hands  . Bradycardia   . Colon polyp   . Diabetes mellitus, type II ([place]) 1986   oral meds  . Dysphasia   . Elevated PSA 2011   per Dr. Jacqlyn Larsen with Uro no problems last 5 or 6 yrs  . Esophageal cancer ([place]) dx sept 18 2018  . Excessive oral secretions   . HOH (hard of hearing)  bilateral AIDES  . Hyperlipidemia   . Hypertension 02/02   off bp meds since hospitalization 3 weeks ago  . Hypothyroidism 1980's  . Palpitations   . Right rib fracture 2015  . SVT  (supraventricular tachycardia) ([place])    a. reported SVT during admission at [facility] 06/2014  . Syncope and collapse    none recent last few weeks  . Wears dentures    upper and lower     Past Surgical History:  Procedure Laterality Date  . [place], 09/2015   COMPRESSED FRACTURE SPINE  . carotid ultrasound  02/02   wnl  . CATARACT EXTRACTION W/PHACO Right 03/19/2016   Procedure: CATARACT EXTRACTION PHACO AND INTRAOCULAR LENS PLACEMENT (IOC);  Surgeon: Ronnell Freshwater, MD;  Location: [place];  Service: Ophthalmology;  Laterality: Right;  DIABETIC-oral med RIGHT  . CATARACT EXTRACTION W/PHACO Left 04/23/2016   Procedure: CATARACT EXTRACTION PHACO AND INTRAOCULAR LENS PLACEMENT (IOC);  Surgeon: Ronnell Freshwater, MD;  Location: [place];  Service: Ophthalmology;  Laterality: Left;  LEFT DIABETIC - oral meds  . CHOLECYSTECTOMY  1967  . COLONOSCOPY W/ POLYPECTOMY  2012   Villous adenoma from the rectum without high-grade dysplasia, 10 mm  . COLONOSCOPY WITH PROPOFOL N/A 03/30/2015   Procedure: COLONOSCOPY WITH PROPOFOL;  Surgeon: Robert Bellow, MD;  Location: [facility] ENDOSCOPY;  Service: Endoscopy;  Laterality: N/A;  . CYST EXCISION     thumb  . CYSTECTOMY  06/14/04   mucous cyst excision with left thumb, IP joint debridement  . ESOPHAGOGASTRODUODENOSCOPY (EGD) WITH PROPOFOL N/A 06/11/2017   Procedure: ESOPHAGOGASTRODUODENOSCOPY (EGD) WITH PROPOFOL;  Surgeon: Lucilla Lame, MD;  Location: [facility] ENDOSCOPY;  Service: Endoscopy;  Laterality: N/A;  . ESOPHAGOGASTRODUODENOSCOPY (EGD) WITH PROPOFOL N/A 12/31/2017   Procedure: ESOPHAGOGASTRODUODENOSCOPY (EGD) WITH PROPOFOL;  Surgeon: [name], MD;  Location: [facility] ENDOSCOPY;  Service: Endoscopy;  Laterality: N/A;  . ESOPHAGOGASTRODUODENOSCOPY (EGD) WITH PROPOFOL N/A 05/08/2018   Procedure: ESOPHAGOGASTRODUODENOSCOPY (EGD) WITH PROPOFOL;  Surgeon: [name], MD;  Location: [facility] ENDOSCOPY;  Service:  Gastroenterology;  Laterality: N/A;  . ESOPHAGOGASTRODUODENOSCOPY (EGD) WITH PROPOFOL N/A 03/20/2019   Procedure: ESOPHAGOGASTRODUODENOSCOPY (EGD) WITH PROPOFOL;  Surgeon: [name], MD;  Location: [facility] ENDOSCOPY;  Service: Gastroenterology;  Laterality: N/A;  . EUS N/A 07/04/2017   Procedure: UPPER ENDOSCOPIC ULTRASOUND (EUS) RADIAL;  Surgeon: [name], MD;  Location: [facility] ENDOSCOPY;  Service: Endoscopy;  Laterality: N/A;  . EYE SURGERY    . FLEXIBLE SIGMOIDOSCOPY N/A 06/11/2017   Procedure: FLEXIBLE SIGMOIDOSCOPY;  Surgeon: Lucilla Lame, MD;  Location: [facility] ENDOSCOPY;  Service: Endoscopy;  Laterality: N/A;  . HERNIA REPAIR  2013  . KYPHOPLASTY N/A 10/06/2015   Procedure: Thoracic twelve kyphoplasty;  Surgeon: Karie Chimera, MD;  Location: [place] NEURO ORS;  Service: Neurosurgery;  Laterality: N/A;  T12 Kyphoplasty  . PROSTATE BIOPSY  11/01/08   Dr. Reece Agar  . [place]  . VASECTOMY  1975    Social History   Socioeconomic History  . Marital status: Widowed    Spouse name: Not on file  . Number of children: Not on file  . Years of education: Not on file  . Highest education level: Not on file  Occupational History  . Occupation: Pharmacist, community: retired    Comment: Retired 1999  Social Needs  . Financial resource strain: Not on file  . Food insecurity    Worry: Not on file    Inability: Not  on file  . Transportation needs    Medical: Not on file    Non-medical: Not on file  Tobacco Use  . Smoking status: Never Smoker  . Smokeless tobacco: Never Used  Substance and Sexual Activity  . Alcohol use: No    Alcohol/week: 0.0 standard drinks  . Drug use: Never  . Sexual activity: Never  Lifestyle  . Physical activity    Days per week: Not on file    Minutes per session: Not on file  . Stress: Not on file  Relationships  . Social Herbalist on phone: Not on file    Gets together: Not on file    Attends religious  service: Not on file    Active member of club or organization: Not on file    Attends meetings of clubs or organizations: Not on file    Relationship status: Not on file  . Intimate partner violence    Fear of current or ex partner: Not on file    Emotionally abused: Not on file    Physically abused: Not on file    Forced sexual activity: Not on file  Other Topics Concern  . Not on file  Social History Narrative   Married- widower 2000 ([place]), not marred but with partner since 2001.    Retired from [employer].     [employer] '[employer], stationed in [place]    Family History  Problem Relation Age of Onset  . Cancer Father        liver  . Cancer Sister        breast  . Cancer Brother        skin  . Cancer Sister        breast  . Heart disease Sister        pacer  . Heart disease Sister   . Cancer Sister        breast cancer  . Heart disease Sister   . Colon cancer Neg Hx   . Prostate cancer Neg Hx      Current Outpatient Medications:  .  acetaminophen (TYLENOL) 500 MG tablet, Take 500-1,000 mg by mouth every 6 (six) hours as needed (for pain.)., Disp: , Rfl:  .  Blood Glucose Monitoring Suppl (ONE TOUCH ULTRA 2) w/Device KIT, Use once to twice daily as needed Dx E11.9, Disp: 1 each, Rfl: 0 .  glimepiride (AMARYL) 1 MG tablet, TAKE 2 TABLETS(2 MG) BY MOUTH DAILY WITH BREAKFAST, Disp: 180 tablet, Rfl: 3 .  glucose blood (ONE TOUCH ULTRA TEST) test strip, TO TEST BLOOD SUGAR ONCE OR TWICE DAILY AND AS DIRECTED DX E11.9.  H/o variable blood sugars., Disp: 200 each, Rfl: 1 .  levothyroxine (SYNTHROID) 75 MCG tablet, TAKE 1 TABLET(75 MCG) BY MOUTH DAILY, Disp: 90 tablet, Rfl: 1 .  metFORMIN (GLUCOPHAGE) 500 MG tablet, TAKE 1 TABLET BY MOUTH EVERY MORNING AND 2 TABLETS BY MOUTH AT SUPPER AND 1 TABLET AT NIGHT IF NEEDED, Disp: 360 tablet, Rfl: 0 .  pantoprazole (PROTONIX) 40 MG tablet, Take 1 tablet (40 mg total) by mouth 2 (two) times daily., Disp: 180 tablet, Rfl: 3 .  potassium chloride  (KLOR-CON) 20 MEQ packet, Take 20 mEq by mouth 2 (two) times daily., Disp: 14 packet, Rfl: 0 .  sucralfate (CARAFATE) 1 g tablet, DISSOLVE 1 TABLET IN 10 MLS OF WATER THEN TAKE BY MOUTH FOUR TIMES DAILY WITH MEALS AND AT BEDTIME, Disp: 360 tablet, Rfl: 1 .  Ascorbic Acid (VITAMIN  C) 1000 MG tablet, Take 1,000 mg by mouth daily., Disp: , Rfl:  .  furosemide (LASIX) 20 MG tablet, Take 1-2 tablets (20-40 mg total) by mouth daily as needed for fluid. (Patient not taking: Reported on 04/30/2019), Disp: , Rfl:  .  Glycopyrrolate 1 MG/5ML SOLN, Take 5 mLs (1 mg total) by mouth every morning. (Patient not taking: Reported on 04/30/2019), Disp: 1350 mL, Rfl: 0 .  ondansetron (ZOFRAN) 4 MG/5ML solution, Take 10 mLs (8 mg total) by mouth every 8 hours as needed for nausea. (Patient not taking: Reported on 04/30/2019), Disp: 150 mL, Rfl: 0 .  pravastatin (PRAVACHOL) 80 MG tablet, TAKE 1 TABLET(80 MG) BY MOUTH AT BEDTIME (Patient not taking: Reported on 04/30/2019), Disp: 90 tablet, Rfl: 3  Physical exam:  Vitals:   04/30/19 0958  BP: 101/66  Pulse: 82  Resp: 16  Temp: 98.1 F (36.7 C)  TempSrc: Tympanic  SpO2: 100%   Physical Exam Constitutional:      Comments: Frail elderly gentleman sitting in a wheelchair.  Appears in no acute distress  HENT:     Head: Normocephalic and atraumatic.     Mouth/Throat:     Mouth: Mucous membranes are moist.     Pharynx: Oropharynx is clear.  Eyes:     Pupils: Pupils are equal, round, and reactive to light.  Neck:     Musculoskeletal: Normal range of motion.  Cardiovascular:     Rate and Rhythm: Normal rate and regular rhythm.     Heart sounds: Normal heart sounds.  Pulmonary:     Effort: Pulmonary effort is normal.     Breath sounds: Normal breath sounds.  Abdominal:     General: Bowel sounds are normal.     Palpations: Abdomen is soft.  Skin:    General: Skin is warm and dry.  Neurological:     Mental Status: He is alert and oriented to person, place, and  time.      CMP Latest Ref Rng & Units 04/30/2019  Glucose 70 - 99 mg/dL 201(H)  BUN 8 - 23 mg/dL 42(H)  Creatinine 0.61 - 1.24 mg/dL 1.77(H)  Sodium 135 - 145 mmol/L 141  Potassium 3.5 - 5.1 mmol/L 2.7(LL)  Chloride 98 - 111 mmol/L 103  CO2 22 - 32 mmol/L 27  Calcium 8.9 - 10.3 mg/dL 8.8(L)  Total Protein 6.5 - 8.1 g/dL 6.4(L)  Total Bilirubin 0.3 - 1.2 mg/dL 0.8  Alkaline Phos 38 - 126 U/L 65  AST 15 - 41 U/L 26  ALT 0 - 44 U/L 28   CBC Latest Ref Rng & Units 04/30/2019  WBC 4.0 - 10.5 K/uL 9.8  Hemoglobin 13.0 - 17.0 g/dL 12.3(L)  Hematocrit 39.0 - 52.0 % 37.3(L)  Platelets 150 - 400 K/uL 193    No images are attached to the encounter.  Nm Pet Image Restag (ps) Skull Base To Thigh  Result Date: 04/03/2019 CLINICAL DATA:  Subsequent treatment strategy for esophageal carcinoma. EXAM: NUCLEAR MEDICINE PET SKULL BASE TO THIGH TECHNIQUE: 7.12 mCi F-18 FDG was injected intravenously. Full-ring PET imaging was performed from the skull base to thigh after the radiotracer. CT data was obtained and used for attenuation correction and anatomic localization. Fasting blood glucose: 98 mg/dl COMPARISON:  PET-CT 05/20/2018. FINDINGS: Mediastinal blood pool activity: SUV max 1.89 Liver activity: SUV max NA NECK: No hypermetabolic lymph nodes in the neck. Incidental CT findings: none CHEST: No FDG avid axillary, supraclavicular, mediastinal, or hilar lymph nodes. No suspicious pulmonary  nodules. Corresponding to the increased soft tissue on recent chest CT from 03/02/2019 there is FDG avid tumor involving the distal esophagus and proximal stomach. This extends across the GE junction measuring approximately 6.9 cm in length with an SUV max of 6.34. Incidental CT findings: Aortic atherosclerosis. Calcification in the left main, lad and RCA coronary arteries noted. ABDOMEN/PELVIS: There is no abnormal uptake within the liver, pancreas, spleen, or adrenal glands. No hypermetabolic lymph nodes within the  abdomen or pelvis. Incidental CT findings: Aortic atherosclerosis. No aneurysm. Prostate gland enlargement. SKELETON: No focal hypermetabolic activity to suggest skeletal metastasis. Chronic compression deformity is noted involving T12, status post kyphoplasty. Incidental CT findings: none IMPRESSION: 1. Distal esophageal and proximal gastric lesion is FDG avid within SUV max of 6.34. 2. No findings to suggest FDG avid nodal metastasis or distant metastatic disease. 3. Aortic Atherosclerosis (ICD10-I70.0). Coronary artery calcifications. Electronically Signed   By: Kerby Moors M.D.   On: 04/03/2019 15:37     Assessment and plan- Patient is a 85 y.o. male  with a history of stage II esophageal cancer and signet cell carcinoma of the stomach with disease progression at GE junction causing luminal narrowing.  This is an acute visit for ongoing assessment of dysphagia and dehydration  Patient will be finishing his radiation treatment next week.  I again discussed with patient and his daughter that it is unclear as to how much the radiation treatment is going to help and if the patient will be able to start swallowing normally again.  He is not a candidate for chemotherapy.  We again talked about hospice but patient's family is not yet ready to accept hospice upon completion of radiation treatment.  Because of his poor oral intake he does have worsening AKI at this time.  We will give him 1 L of IV fluids today.  Patient also reports burning and pain when he swallows and I will give him a prescription for Magic mouthwash to swish and swallow as well as oxycodone liquid 5 mg every 6 as needed for his pain.  I do not see any evidence of mucositis or thrush today.  Hypokalemia: We will give him 30 mEq of IV potassium today and he will continue oral potassium replacements at home.  Repeat potassium check next week  I will reassess him 1 week from now   Visit Diagnosis 1. Goals of care, counseling/discussion    2. Hypokalemia   3. AKI (acute kidney injury) ([place])   4. Esophageal cancer, stage IIB ([place])      Dr. Randa Evens, MD, MPH [facility] [phone] 05/01/2019 12:47 PM

## 2019-05-01 NOTE — Telephone Encounter (Signed)
Spoke to [employer]. Sending it in the next hour

## 2019-05-04 ENCOUNTER — Other Ambulatory Visit: Payer: Self-pay

## 2019-05-04 ENCOUNTER — Ambulatory Visit
Admission: RE | Admit: 2019-05-04 | Discharge: 2019-05-04 | Disposition: A | Discharge status: Home / Self Care | Payer: Medicare Other | Source: Ambulatory Visit | Attending: Radiation Oncology | Admitting: Radiation Oncology

## 2019-05-04 DIAGNOSIS — C155 Malignant neoplasm of lower third of esophagus: Secondary | ICD-10-CM | POA: Diagnosis not present

## 2019-05-04 DIAGNOSIS — Z510 Encounter for antineoplastic radiation therapy: Secondary | ICD-10-CM | POA: Diagnosis not present

## 2019-05-05 ENCOUNTER — Telehealth: Payer: Self-pay | Admitting: *Deleted

## 2019-05-05 ENCOUNTER — Inpatient Hospital Stay: Payer: Medicare Other

## 2019-05-05 ENCOUNTER — Inpatient Hospital Stay (HOSPITAL_BASED_OUTPATIENT_CLINIC_OR_DEPARTMENT_OTHER): Payer: Medicare Other | Admitting: Oncology

## 2019-05-05 ENCOUNTER — Ambulatory Visit
Admission: RE | Admit: 2019-05-05 | Discharge: 2019-05-05 | Disposition: A | Discharge status: Home / Self Care | Payer: Medicare Other | Source: Ambulatory Visit | Attending: Radiation Oncology | Admitting: Radiation Oncology

## 2019-05-05 ENCOUNTER — Telehealth: Payer: Self-pay | Admitting: Nurse Practitioner

## 2019-05-05 ENCOUNTER — Other Ambulatory Visit: Payer: Self-pay

## 2019-05-05 VITALS — BP 110/64 | HR 87 | Temp 98.3°F | Resp 16 | Wt 114.3 lb

## 2019-05-05 DIAGNOSIS — E785 Hyperlipidemia, unspecified: Secondary | ICD-10-CM | POA: Diagnosis not present

## 2019-05-05 DIAGNOSIS — Z923 Personal history of irradiation: Secondary | ICD-10-CM | POA: Diagnosis not present

## 2019-05-05 DIAGNOSIS — I10 Essential (primary) hypertension: Secondary | ICD-10-CM | POA: Diagnosis not present

## 2019-05-05 DIAGNOSIS — C159 Malignant neoplasm of esophagus, unspecified: Secondary | ICD-10-CM | POA: Diagnosis not present

## 2019-05-05 DIAGNOSIS — R5381 Other malaise: Secondary | ICD-10-CM | POA: Diagnosis not present

## 2019-05-05 DIAGNOSIS — Z7189 Other specified counseling: Secondary | ICD-10-CM

## 2019-05-05 DIAGNOSIS — C155 Malignant neoplasm of lower third of esophagus: Secondary | ICD-10-CM | POA: Diagnosis not present

## 2019-05-05 DIAGNOSIS — R05 Cough: Secondary | ICD-10-CM | POA: Diagnosis not present

## 2019-05-05 DIAGNOSIS — R5383 Other fatigue: Secondary | ICD-10-CM | POA: Diagnosis not present

## 2019-05-05 DIAGNOSIS — Z510 Encounter for antineoplastic radiation therapy: Secondary | ICD-10-CM | POA: Diagnosis not present

## 2019-05-05 DIAGNOSIS — R112 Nausea with vomiting, unspecified: Secondary | ICD-10-CM | POA: Diagnosis not present

## 2019-05-05 DIAGNOSIS — R4702 Dysphasia: Secondary | ICD-10-CM | POA: Diagnosis not present

## 2019-05-05 DIAGNOSIS — Z9221 Personal history of antineoplastic chemotherapy: Secondary | ICD-10-CM | POA: Diagnosis not present

## 2019-05-05 DIAGNOSIS — E876 Hypokalemia: Secondary | ICD-10-CM

## 2019-05-05 DIAGNOSIS — E860 Dehydration: Secondary | ICD-10-CM | POA: Diagnosis not present

## 2019-05-05 DIAGNOSIS — E039 Hypothyroidism, unspecified: Secondary | ICD-10-CM | POA: Diagnosis not present

## 2019-05-05 DIAGNOSIS — R093 Abnormal sputum: Secondary | ICD-10-CM | POA: Diagnosis not present

## 2019-05-05 DIAGNOSIS — R634 Abnormal weight loss: Secondary | ICD-10-CM | POA: Diagnosis not present

## 2019-05-05 DIAGNOSIS — R630 Anorexia: Secondary | ICD-10-CM | POA: Diagnosis not present

## 2019-05-05 LAB — CBC
HCT: 38.7 % — ABNORMAL LOW (ref 39.0–52.0)
Hemoglobin: 13 g/dL (ref 13.0–17.0)
MCH: 29 pg (ref 26.0–34.0)
MCHC: 33.6 g/dL (ref 30.0–36.0)
MCV: 86.2 fL (ref 80.0–100.0)
Platelets: 195 10*3/uL (ref 150–400)
RBC: 4.49 MIL/uL (ref 4.22–5.81)
RDW: 16.3 % — ABNORMAL HIGH (ref 11.5–15.5)
WBC: 9.2 10*3/uL (ref 4.0–10.5)
nRBC: 0 % (ref 0.0–0.2)

## 2019-05-05 LAB — BASIC METABOLIC PANEL
Anion gap: 10 (ref 5–15)
BUN: 28 mg/dL — ABNORMAL HIGH (ref 8–23)
CO2: 25 mmol/L (ref 22–32)
Calcium: 8.7 mg/dL — ABNORMAL LOW (ref 8.9–10.3)
Chloride: 102 mmol/L (ref 98–111)
Creatinine, Ser: 1.46 mg/dL — ABNORMAL HIGH (ref 0.61–1.24)
GFR calc Af Amer: 50 mL/min — ABNORMAL LOW (ref >60)
GFR calc non Af Amer: 43 mL/min — ABNORMAL LOW (ref >60)
Glucose, Bld: 208 mg/dL — ABNORMAL HIGH (ref 70–99)
Potassium: 2.9 mmol/L — ABNORMAL LOW (ref 3.5–5.1)
Sodium: 137 mmol/L (ref 135–145)

## 2019-05-05 MED ORDER — SODIUM CHLORIDE 0.9 % IV SOLN
INTRAVENOUS | Status: DC
  Administered 2019-05-05: 12:00:00 via INTRAVENOUS
  Filled 2019-05-05 (×2): qty 1000

## 2019-05-05 NOTE — Telephone Encounter (Signed)
Returning daughters phone call, she stated that she wanted to cancel the Palliative Consult scheduled for 05/11/19 due to Dr. Janese Banks is sending over a Hospice referral for this patient.  Appointment has been cancelled.

## 2019-05-05 NOTE — Telephone Encounter (Signed)
After MD and pt and daughter spoke today. Has decided for hospice services. They do want eval to make total sure that this is the exact course they want. I have faxed over forms with papers for hospice

## 2019-05-06 ENCOUNTER — Other Ambulatory Visit: Payer: Self-pay

## 2019-05-06 ENCOUNTER — Ambulatory Visit
Admission: RE | Admit: 2019-05-06 | Discharge: 2019-05-06 | Disposition: A | Discharge status: Home / Self Care | Payer: Medicare Other | Source: Ambulatory Visit | Attending: Radiation Oncology | Admitting: Radiation Oncology

## 2019-05-06 ENCOUNTER — Other Ambulatory Visit: Payer: Self-pay | Admitting: Family Medicine

## 2019-05-06 ENCOUNTER — Encounter: Payer: Self-pay | Admitting: Oncology

## 2019-05-06 DIAGNOSIS — Z510 Encounter for antineoplastic radiation therapy: Secondary | ICD-10-CM | POA: Diagnosis not present

## 2019-05-06 DIAGNOSIS — C155 Malignant neoplasm of lower third of esophagus: Secondary | ICD-10-CM | POA: Diagnosis not present

## 2019-05-06 NOTE — Progress Notes (Signed)
Hematology/Oncology Consult note [facility]  Telephone:([phone] [phone]  Patient Care Team: Tonia Ghent, MD as PCP - General (Family Medicine) Bary Castilla, Forest Gleason, MD (General Surgery) Modesto Charon, MD (Family Medicine) Lucilla Lame, MD as Consulting Physician (Gastroenterology) Lequita Asal, MD as Referring Physician (Hematology and Oncology) Clent Jacks, RN as Registered Nurse Noreene Filbert, MD as Referring Physician (Radiation Oncology) Sindy Guadeloupe, MD as Consulting Physician (Oncology)   Name of the patient: Aaron Becker  [id number]  11/06/1929   Date of visit: 05/06/19  Diagnosis- history of stage II esophageal cancer status post concurrent chemoradiation now with local recurrence  Chief complaint/ Reason for visit- routine f/u visit for dehydration dysphagia and hypokalemia  Heme/Onc history: Patient is a 85 yr old male with h/o Stage IIB esophageal cancer T2N0 diagnosed in sept 2018. He received concurrent carbo/taxol RT for 5 weeks which h completed in dec 2018. 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 He was also seen by Dr. Fanny Skates at [facility] and plan was watchful waiting versus 5 fu single agent chemo. Patient chose watchful waiting  Patient had repeat ct scans in June 2020 for weight loss/ difficulty swallowing. Scans showed no distant metastases but increased GE junction thickening confirmed on barium swallow. He underwent EGD by Dr. Vicente Males which showed luminal narrowing by persistent tumor Patient is not a candidate for chemotherapy.  Palliative options including radiation versus hospice was discussed with the patient.  Patient is currently undergoing palliative radiation   Interval history- he is finishing RT tomorrow. He reports swallowing is a little better after pain meds, magic mouthwash and ongoing RT. He is taking 1 packet of potassium but not 2 and there  are days whenhe doesn't even take that  ECOG PS- 2-3 Pain scale- 0   Review of systems- Review of Systems  Constitutional: Positive for malaise/fatigue. Negative for chills, fever and weight loss.  HENT: Negative for congestion, ear discharge and nosebleeds.   Eyes: Negative for blurred vision.  Respiratory: Negative for cough, hemoptysis, sputum production, shortness of breath and wheezing.   Cardiovascular: Negative for chest pain, palpitations, orthopnea and claudication.  Gastrointestinal: Negative for abdominal pain, blood in stool, constipation, diarrhea, heartburn, melena, nausea and vomiting.       Dysphagia  Genitourinary: Negative for dysuria, flank pain, frequency, hematuria and urgency.  Musculoskeletal: Negative for back pain, joint pain and myalgias.  Skin: Negative for rash.  Neurological: Negative for dizziness, tingling, focal weakness, seizures, weakness and headaches.  Endo/Heme/Allergies: Does not bruise/bleed easily.  Psychiatric/Behavioral: Negative for depression and suicidal ideas. The patient does not have insomnia.       Allergies  Allergen Reactions  . Flomax [Tamsulosin Hcl] Other (See Comments)    syncope  . Ibuprofen Other (See Comments)    Oral blisters at high doses  . Rosuvastatin     REACTION: muscle stiffness  . Sulfa Antibiotics Rash  . Sulfonamide Derivatives Rash     Past Medical History:  Diagnosis Date  . Anemia   . Arthritis    hands  . Bradycardia   . Colon polyp   . Diabetes mellitus, type II ([place]) 1986   oral meds  . Dysphasia   . Elevated PSA 2011   per Dr. Jacqlyn Larsen with Uro no problems last 5 or 6 yrs  . Esophageal cancer ([place]) dx sept 18 2018  . Excessive oral secretions   . HOH (hard  of hearing)    bilateral AIDES  . Hyperlipidemia   . Hypertension 02/02   off bp meds since hospitalization 3 weeks ago  . Hypothyroidism 1980's  . Palpitations   . Right rib fracture 2015  . SVT (supraventricular tachycardia) ([place])     a. reported SVT during admission at [facility] 06/2014  . Syncope and collapse    none recent last few weeks  . Wears dentures    upper and lower     Past Surgical History:  Procedure Laterality Date  . [place], 09/2015   COMPRESSED FRACTURE SPINE  . carotid ultrasound  02/02   wnl  . CATARACT EXTRACTION W/PHACO Right 03/19/2016   Procedure: CATARACT EXTRACTION PHACO AND INTRAOCULAR LENS PLACEMENT (IOC);  Surgeon: Ronnell Freshwater, MD;  Location: [place];  Service: Ophthalmology;  Laterality: Right;  DIABETIC-oral med RIGHT  . CATARACT EXTRACTION W/PHACO Left 04/23/2016   Procedure: CATARACT EXTRACTION PHACO AND INTRAOCULAR LENS PLACEMENT (IOC);  Surgeon: Ronnell Freshwater, MD;  Location: [place];  Service: Ophthalmology;  Laterality: Left;  LEFT DIABETIC - oral meds  . CHOLECYSTECTOMY  1967  . COLONOSCOPY W/ POLYPECTOMY  2012   Villous adenoma from the rectum without high-grade dysplasia, 10 mm  . COLONOSCOPY WITH PROPOFOL N/A 03/30/2015   Procedure: COLONOSCOPY WITH PROPOFOL;  Surgeon: Robert Bellow, MD;  Location: [facility] ENDOSCOPY;  Service: Endoscopy;  Laterality: N/A;  . CYST EXCISION     thumb  . CYSTECTOMY  06/14/04   mucous cyst excision with left thumb, IP joint debridement  . ESOPHAGOGASTRODUODENOSCOPY (EGD) WITH PROPOFOL N/A 06/11/2017   Procedure: ESOPHAGOGASTRODUODENOSCOPY (EGD) WITH PROPOFOL;  Surgeon: Lucilla Lame, MD;  Location[facility] END[facility]COPY;  Service: Endoscopy;  Laterality: N/A;  . ESOPHAGOGASTRODUODENOSCOPY (EGD) WITH PROPOFOL N/A 12/31/2017   Procedure: ESOPHAGOGASTRODUODENOSCOPY (EGD) WITH PROPOFOL;  Surgeon: [name], MD;  Location: [facility] ENDOSCOPY;  Service: Endoscopy;  Laterality: N/A;  . ESOPHAGOGASTRODUODENOSCOPY (EGD) WITH PROPOFOL N/A 05/08/2018   Procedure: ESOPHAGOGASTRODUODENOSCOPY (EGD) WITH PROPOFOL;  Surgeon: [name], MD;  Location: [facility] ENDOSCOPY;  Service: Gastroenterology;  Laterality: N/A;  .  ESOPHAGOGASTRODUODENOSCOPY (EGD) WITH PROPOFOL N/A 03/20/2019   Procedure: ESOPHAGOGASTRODUODENOSCOPY (EGD) WITH PROPOFOL;  Surgeon: [name], MD;  Location: [facility] ENDOSCOPY;  Service: Gastroenterology;  Laterality: N/A;  . EUS N/A 07/04/2017   Procedure: UPPER ENDOSCOPIC ULTRASOUND (EUS) RADIAL;  Surgeon: [name], MD;  Location: [facility] ENDOSCOPY;  Service: Endoscopy;  Laterality: N/A;  . EYE SURGERY    . FLEXIBLE SIGMOIDOSCOPY N/A 06/11/2017   Procedure: FLEXIBLE SIGMOIDOSCOPY;  Surgeon: Lucilla Lame, MD;  Location: [facility] ENDOSCOPY;  Service: Endoscopy;  Laterality: N/A;  . HERNIA REPAIR  2013  . KYPHOPLASTY N/A 10/06/2015   Procedure: Thoracic twelve kyphoplasty;  Surgeon: Karie Chimera, MD;  Location: [place] NEURO ORS;  Service: Neurosurgery;  Laterality: N/A;  T12 Kyphoplasty  . PROSTATE BIOPSY  11/01/08   Dr. Reece Agar  . [place]  . VASECTOMY  1975    Social History   Socioeconomic History  . Marital status: Widowed    Spouse name: Not on file  . Number of children: Not on file  . Years of education: Not on file  . Highest education level: Not on file  Occupational History  . Occupation: Pharmacist, community: retired    Comment: Retired 1999  Social Needs  . Financial resource strain: Not on file  . Food insecurity    Worry: Not on file  Inability: Not on file  . Transportation needs    Medical: Not on file    Non-medical: Not on file  Tobacco Use  . Smoking status: Never Smoker  . Smokeless tobacco: Never Used  Substance and Sexual Activity  . Alcohol use: No    Alcohol/week: 0.0 standard drinks  . Drug use: Never  . Sexual activity: Never  Lifestyle  . Physical activity    Days per week: Not on file    Minutes per session: Not on file  . Stress: Not on file  Relationships  . Social Herbalist on phone: Not on file    Gets together: Not on file    Attends religious service: Not on file    Active member  of club or organization: Not on file    Attends meetings of clubs or organizations: Not on file    Relationship status: Not on file  . Intimate partner violence    Fear of current or ex partner: Not on file    Emotionally abused: Not on file    Physically abused: Not on file    Forced sexual activity: Not on file  Other Topics Concern  . Not on file  Social History Narrative   Mar[place]- widower 2000 ([place]), not marred but with partner since 2001.    Retired from [employer].     [employer] '[employer], stationed in [place]    Family History  Problem Relation Age of Onset  . Cancer Father        liver  . Cancer Sister        breast  . Cancer Brother        skin  . Cancer Sister        breast  . Heart disease Sister        pacer  . Heart disease Sister   . Cancer Sister        breast cancer  . Heart disease Sister   . Colon cancer Neg Hx   . Prostate cancer Neg Hx      Current Outpatient Medications:  .  Blood Glucose Monitoring Suppl (ONE TOUCH ULTRA 2) w/Device KIT, Use once to twice daily as needed Dx E11.9, Disp: 1 each, Rfl: 0 .  glimepiride (AMARYL) 1 MG tablet, TAKE 2 TABLETS(2 MG) BY MOUTH DAILY WITH BREAKFAST, Disp: 180 tablet, Rfl: 3 .  levothyroxine (SYNTHROID) 75 MCG tablet, TAKE 1 TABLET(75 MCG) BY MOUTH DAILY, Disp: 90 tablet, Rfl: 1 .  magic mouthwash w/lidocaine SOLN, Take 5 mLs by mouth 4 (four) times daily as needed for mouth pain., Disp: 360 mL, Rfl: 1 .  metFORMIN (GLUCOPHAGE) 500 MG tablet, TAKE 1 TABLET BY MOUTH EVERY MORNING AND 2 TABLETS BY MOUTH AT SUPPER AND 1 TABLET AT NIGHT IF NEEDED, Disp: 360 tablet, Rfl: 0 .  pantoprazole (PROTONIX) 40 MG tablet, Take 1 tablet (40 mg total) by mouth 2 (two) times daily., Disp: 180 tablet, Rfl: 3 .  potassium chloride (KLOR-CON) 20 MEQ packet, Take 20 mEq by mouth 2 (two) times daily., Disp: 14 packet, Rfl: 0 .  sucralfate (CARAFATE) 1 g tablet, DISSOLVE 1 TABLET IN 10 MLS OF WATER THEN TAKE BY MOUTH FOUR TIMES DAILY WITH  MEALS AND AT BEDTIME, Disp: 360 tablet, Rfl: 1 .  HYDROcodone-acetaminophen (HYCET) 7.5-325 mg/15 ml solution, Take 10 mLs by mouth every 6 (six) hours as needed for moderate pain. (Patient not taking: Reported on 05/05/2019), Disp: 240 mL, Rfl: 0 .  ondansetron (ZOFRAN) 4 MG/5ML solution, Take 10 mLs (8 mg total) by mouth every 8 hours as needed for nausea. (Patient not taking: Reported on 04/30/2019), Disp: 150 mL, Rfl: 0 .  ONETOUCH ULTRA test strip, USE 1 STRIP TO CHECK GLUCOSE ONCE TO TWICE DAILY AND AS DIRECTED, Disp: 200 each, Rfl: 2  Physical exam: There were no vitals filed for this visit. Physical Exam Constitutional:      Comments: Thin frail elderly gentleman sitting in a wheelchair  HENT:     Head: Normocephalic and atraumatic.  Eyes:     Pupils: Pupils are equal, round, and reactive to light.  Neck:     Musculoskeletal: Normal range of motion.  Cardiovascular:     Rate and Rhythm: Normal rate and regular rhythm.     Heart sounds: Normal heart sounds.  Pulmonary:     Effort: Pulmonary effort is normal.     Breath sounds: Normal breath sounds.  Abdominal:     General: Bowel sounds are normal.     Palpations: Abdomen is soft.  Skin:    General: Skin is warm and dry.  Neurological:     Mental Status: He is alert and oriented to person, place, and time.      CMP Latest Ref Rng & Units 05/05/2019  Glucose 70 - 99 mg/dL 208(H)  BUN 8 - 23 mg/dL 28(H)  Creatinine 0.61 - 1.24 mg/dL 1.46(H)  Sodium 135 - 145 mmol/L 137  Potassium 3.5 - 5.1 mmol/L 2.9(L)  Chloride 98 - 111 mmol/L 102  CO2 22 - 32 mmol/L 25  Calcium 8.9 - 10.3 mg/dL 8.7(L)  Total Protein 6.5 - 8.1 g/dL -  Total Bilirubin 0.3 - 1.2 mg/dL -  Alkaline Phos 38 - 126 U/L -  AST 15 - 41 U/L -  ALT 0 - 44 U/L -   CBC Latest Ref Rng & Units 05/05/2019  WBC 4.0 - 10.5 K/uL 9.2  Hemoglobin 13.0 - 17.0 g/dL 13.0  Hematocrit 39.0 - 52.0 % 38.7(L)  Platelets 150 - 400 K/uL 195    Assessment and plan- Patient is a  85 y.o. male  with a history of stage II esophageal cancer and signet cell carcinoma of the stomach with disease progression at GE junction causing luminal narrowing.  he is undergoing palliative RT. This is a isit for ongoing dehydration, hypokalemia and goals of care  1. AKI- improved as compared to last week but still not at baseline. Will give 1L IVF today.  2. Hypokalemia: he does not have a port and doesn't want to sit too long for infusion. Will give 10 meq IV potassium today and encourage him to take potassium packets twice daily  3. Goals of care- Again discussed with patient and his daughter Rodena Piety that he is not a candidate for palliative chemo. He completes RT tomorrow. His dysphagia may improve but may also recur if tumor progresses again. Overall given his age comorbidities and esophageal cancer, his prognosis is likely <6 months. Home hospice would be reasonable at this time. Discussed differences between home palliative care and hospice. If they choose not to proceed with hospice - we are looking at frequent doctors visits for IVF, potassium etc which will worsen overall QOL for the patient. Not improve hs overall survival. Patient and daughter accepting and willing to have hospice informational meeting and we will make the referral.   Visit Diagnosis 1. Goals of care, counseling/discussion   2. Esophageal cancer, stage IIB ([place])  Dr. Randa Evens, MD, MPH [facility] [phone] 05/06/2019 1:42 PM              '

## 2019-05-11 ENCOUNTER — Other Ambulatory Visit: Payer: Self-pay | Admitting: Nurse Practitioner

## 2019-05-20 ENCOUNTER — Telehealth: Payer: Self-pay | Admitting: Family Medicine

## 2019-05-20 NOTE — Telephone Encounter (Signed)
Called and talked to [place].  Patient was asleep.  I told her that I was thinking about both of them and to call me as needed.  She thanked me for the call.  App help of all involved.

## 2019-06-02 ENCOUNTER — Ambulatory Visit: Payer: Medicare Other | Admitting: Oncology

## 2019-06-02 ENCOUNTER — Ambulatory Visit: Payer: Medicare Other | Admitting: Radiation Oncology

## 2019-06-02 ENCOUNTER — Other Ambulatory Visit: Payer: Medicare Other

## 2019-06-08 ENCOUNTER — Telehealth: Payer: Self-pay | Admitting: *Deleted

## 2019-06-08 NOTE — Telephone Encounter (Signed)
Ok he is on hospice. Please let hospice know to inform us if they are concerned about any new symptoms post fall

## 2019-06-08 NOTE — Telephone Encounter (Signed)
Patient went out to try to fix a busted waterline and fell on the concrete, the family called EMS and he has some abrasions and a little bleeding, but is otherwise alright. They did not take him to the ER

## 2019-06-19 NOTE — Telephone Encounter (Signed)
Opened in error

## 2019-06-23 ENCOUNTER — Other Ambulatory Visit: Payer: Self-pay | Admitting: Oncology

## 2019-06-26 ENCOUNTER — Other Ambulatory Visit: Payer: Medicare Other

## 2019-06-26 ENCOUNTER — Ambulatory Visit: Payer: Medicare Other | Admitting: Oncology

## 2019-07-24 ENCOUNTER — Other Ambulatory Visit: Payer: Self-pay | Admitting: Oncology

## 2019-07-27 ENCOUNTER — Telehealth: Payer: Self-pay | Admitting: *Deleted

## 2019-07-27 NOTE — Telephone Encounter (Signed)
Call returned to [place] and orders per Dr Janese Banks given to her, She said she will order the compression stockings and increase his lasix to 40 mg

## 2019-07-27 NOTE — Telephone Encounter (Signed)
Hosopice nurse Vivien Rota called reporting that patient has weeping edema bilateral lower extremities and is requesting an order for medicine for it, he is taking Lasix 20 mg daily and it is not helping. Please advise.

## 2019-07-27 NOTE — Telephone Encounter (Signed)
Edema likely from low protein. I dont think any medication will really help this. Can try to increase lasix to 40 mg and see if it helps. Compression stockings if tolerated

## 2019-08-03 ENCOUNTER — Telehealth: Payer: Self-pay | Admitting: *Deleted

## 2019-08-03 NOTE — Telephone Encounter (Signed)
VERBAL ORDER called to hospice 

## 2019-08-03 NOTE — Telephone Encounter (Signed)
Yes ok to do that

## 2019-08-03 NOTE — Telephone Encounter (Signed)
Tia with hospice called asking for orders for leg wraps for this patient . Please advise

## 2019-08-06 ENCOUNTER — Telehealth: Payer: Self-pay | Admitting: *Deleted

## 2019-08-06 NOTE — Telephone Encounter (Signed)
500 mg bid for 7 days keflex

## 2019-08-06 NOTE — Telephone Encounter (Signed)
VERBAL ORDER called to Otila Kluver who will call prescription in to pharmacy

## 2019-08-06 NOTE — Telephone Encounter (Signed)
Tina with Hospice called reporting that patient has some cellulitis in his elbow and she is requesting order for Keflex for it. Please advise

## 2019-08-14 ENCOUNTER — Telehealth: Payer: Self-pay | Admitting: *Deleted

## 2019-08-14 NOTE — Telephone Encounter (Signed)
Aaron Becker with hospice called and reports that his legs continue to weep and they are using una boots, but now he is hardly producing urine, she thinks his kidneys are shutting down. She is asking if he needs to continue to take Lasix or can he stop it.Please advise

## 2019-08-14 NOTE — Telephone Encounter (Signed)
VERBAL ORDER called to Toni  

## 2019-08-14 NOTE — Telephone Encounter (Signed)
If lasix is not helping his leg swelling they should stop it

## 2019-08-17 ENCOUNTER — Other Ambulatory Visit: Payer: Self-pay | Admitting: Oncology

## 2019-08-17 DIAGNOSIS — C159 Malignant neoplasm of esophagus, unspecified: Secondary | ICD-10-CM

## 2019-08-21 ENCOUNTER — Other Ambulatory Visit: Payer: Self-pay | Admitting: Oncology

## 2019-09-15 ENCOUNTER — Telehealth: Payer: Self-pay

## 2019-09-15 NOTE — Telephone Encounter (Signed)
Franco Collet (DPR signed) left v/m of pts death on 10-12-2019 at 9:30 AM at pts home. Pt passed peacefully.

## 2019-09-16 NOTE — Telephone Encounter (Signed)
Chart updated

## 2019-09-16 NOTE — Telephone Encounter (Signed)
Please update the chart regarding the patient's death.  I called Manus Gunning last night.  Late entry.  I thanked her for her effort.  I was always really glad to see this gentleman along with Manus Gunning.  Others at the clinic (especially Dr. Darnell Level and Terri Skains) extend their condolences also and I passed that message along.  Manus Gunning was grateful for the message and the call.  I asked her to update me/us as needed and she said she would.

## 2019-09-25 DEATH — deceased
